# Patient Record
Sex: Female | Born: 1935
Health system: Southern US, Community
[De-identification: ages and names within clinical notes are randomized; demographics above are authoritative.]

## PROBLEM LIST (undated history)

## (undated) DIAGNOSIS — R42 Dizziness and giddiness: Secondary | ICD-10-CM

## (undated) DIAGNOSIS — E785 Hyperlipidemia, unspecified: Secondary | ICD-10-CM

## (undated) DIAGNOSIS — M949 Disorder of cartilage, unspecified: Secondary | ICD-10-CM

## (undated) DIAGNOSIS — I8310 Varicose veins of unspecified lower extremity with inflammation: Secondary | ICD-10-CM

## (undated) DIAGNOSIS — M899 Disorder of bone, unspecified: Secondary | ICD-10-CM

## (undated) DIAGNOSIS — I809 Phlebitis and thrombophlebitis of unspecified site: Secondary | ICD-10-CM

## (undated) DIAGNOSIS — E039 Hypothyroidism, unspecified: Secondary | ICD-10-CM

## (undated) DIAGNOSIS — G309 Alzheimer's disease, unspecified: Secondary | ICD-10-CM

## (undated) DIAGNOSIS — R0989 Other specified symptoms and signs involving the circulatory and respiratory systems: Secondary | ICD-10-CM

## (undated) DIAGNOSIS — R5381 Other malaise: Secondary | ICD-10-CM

## (undated) DIAGNOSIS — C73 Malignant neoplasm of thyroid gland: Secondary | ICD-10-CM

## (undated) DIAGNOSIS — C801 Malignant (primary) neoplasm, unspecified: Secondary | ICD-10-CM

## (undated) DIAGNOSIS — R5383 Other fatigue: Secondary | ICD-10-CM

## (undated) DIAGNOSIS — G609 Hereditary and idiopathic neuropathy, unspecified: Secondary | ICD-10-CM

## (undated) DIAGNOSIS — E789 Disorder of lipoprotein metabolism, unspecified: Secondary | ICD-10-CM

## (undated) DIAGNOSIS — F0280 Dementia in other diseases classified elsewhere without behavioral disturbance: Secondary | ICD-10-CM

## (undated) DIAGNOSIS — I10 Essential (primary) hypertension: Secondary | ICD-10-CM

## (undated) DIAGNOSIS — E041 Nontoxic single thyroid nodule: Secondary | ICD-10-CM

## (undated) DIAGNOSIS — M810 Age-related osteoporosis without current pathological fracture: Secondary | ICD-10-CM

## (undated) DIAGNOSIS — G4762 Sleep related leg cramps: Secondary | ICD-10-CM

## (undated) DIAGNOSIS — I639 Cerebral infarction, unspecified: Secondary | ICD-10-CM

## (undated) HISTORY — DX: Disorder of cartilage, unspecified: M94.9

## (undated) HISTORY — DX: Malignant neoplasm of thyroid gland: C73

## (undated) HISTORY — DX: Other fatigue: R53.83

## (undated) HISTORY — DX: Essential (primary) hypertension: I10

## (undated) HISTORY — DX: Disorder of bone, unspecified: M89.9

## (undated) HISTORY — DX: Nontoxic single thyroid nodule: E04.1

## (undated) HISTORY — DX: Dementia in other diseases classified elsewhere, unspecified severity, without behavioral disturbance, psychotic disturbance, mood disturbance, and anxiety: F02.80

## (undated) HISTORY — DX: Other specified symptoms and signs involving the circulatory and respiratory systems: R09.89

## (undated) HISTORY — DX: Sleep related leg cramps: G47.62

## (undated) HISTORY — DX: Malignant (primary) neoplasm, unspecified: C80.1

## (undated) HISTORY — DX: Hypothyroidism, unspecified: E03.9

## (undated) HISTORY — DX: Age-related osteoporosis without current pathological fracture: M81.0

## (undated) HISTORY — DX: Phlebitis and thrombophlebitis of unspecified site: I80.9

## (undated) HISTORY — PX: COLON SURGERY: SHX602

## (undated) HISTORY — DX: Varicose veins of unspecified lower extremity with inflammation: I83.10

## (undated) HISTORY — DX: Alzheimer's disease, unspecified: G30.9

## (undated) HISTORY — DX: Hyperlipidemia, unspecified: E78.5

## (undated) HISTORY — PX: ABDOMINAL HYSTERECTOMY: SHX81

## (undated) HISTORY — DX: Dizziness and giddiness: R42

## (undated) HISTORY — DX: Other malaise: R53.81

## (undated) HISTORY — PX: KNEE SURGERY: SHX244

## (undated) HISTORY — DX: Disorder of lipoprotein metabolism, unspecified: E78.9

## (undated) HISTORY — DX: Hereditary and idiopathic neuropathy, unspecified: G60.9

---

## 1981-12-31 HISTORY — PX: OTHER SURGICAL HISTORY: SHX169

## 2001-10-24 ENCOUNTER — Encounter: Payer: Self-pay | Admitting: *Deleted

## 2001-10-28 ENCOUNTER — Inpatient Hospital Stay (HOSPITAL_COMMUNITY): Admission: RE | Admit: 2001-10-28 | Discharge: 2001-10-31 | Payer: Self-pay | Admitting: *Deleted

## 2001-10-31 ENCOUNTER — Inpatient Hospital Stay (HOSPITAL_COMMUNITY)
Admission: RE | Admit: 2001-10-31 | Discharge: 2001-11-07 | Payer: Self-pay | Admitting: Physical Medicine & Rehabilitation

## 2001-12-03 ENCOUNTER — Encounter: Admission: RE | Admit: 2001-12-03 | Discharge: 2002-01-30 | Payer: Self-pay | Admitting: *Deleted

## 2004-04-07 ENCOUNTER — Ambulatory Visit (HOSPITAL_COMMUNITY): Admission: RE | Admit: 2004-04-07 | Discharge: 2004-04-07 | Payer: Self-pay | Admitting: Endocrinology

## 2006-12-31 HISTORY — PX: OTHER SURGICAL HISTORY: SHX169

## 2007-01-25 ENCOUNTER — Emergency Department (HOSPITAL_COMMUNITY): Admission: EM | Admit: 2007-01-25 | Discharge: 2007-01-25 | Payer: Self-pay | Admitting: Emergency Medicine

## 2007-02-05 ENCOUNTER — Encounter: Admission: RE | Admit: 2007-02-05 | Discharge: 2007-02-05 | Payer: Self-pay | Admitting: Orthopedic Surgery

## 2009-12-07 LAB — HM COLONOSCOPY

## 2010-02-19 ENCOUNTER — Emergency Department (HOSPITAL_COMMUNITY): Admission: EM | Admit: 2010-02-19 | Discharge: 2010-02-19 | Payer: Self-pay | Admitting: Emergency Medicine

## 2010-09-10 ENCOUNTER — Ambulatory Visit: Payer: Self-pay | Admitting: Vascular Surgery

## 2010-09-10 ENCOUNTER — Encounter (INDEPENDENT_AMBULATORY_CARE_PROVIDER_SITE_OTHER): Payer: Self-pay | Admitting: Emergency Medicine

## 2010-09-10 ENCOUNTER — Emergency Department (HOSPITAL_COMMUNITY): Admission: EM | Admit: 2010-09-10 | Discharge: 2010-09-10 | Payer: Self-pay | Admitting: Emergency Medicine

## 2010-10-20 ENCOUNTER — Encounter: Admission: RE | Admit: 2010-10-20 | Discharge: 2010-10-20 | Payer: Self-pay | Admitting: Internal Medicine

## 2011-01-20 ENCOUNTER — Encounter: Payer: Self-pay | Admitting: Internal Medicine

## 2011-02-16 ENCOUNTER — Other Ambulatory Visit: Payer: Self-pay | Admitting: Internal Medicine

## 2011-02-16 DIAGNOSIS — R0989 Other specified symptoms and signs involving the circulatory and respiratory systems: Secondary | ICD-10-CM

## 2011-02-20 ENCOUNTER — Ambulatory Visit
Admission: RE | Admit: 2011-02-20 | Discharge: 2011-02-20 | Disposition: A | Discharge status: Home / Self Care | Payer: PRIVATE HEALTH INSURANCE | Source: Ambulatory Visit | Attending: Internal Medicine | Admitting: Internal Medicine

## 2011-02-20 DIAGNOSIS — R0989 Other specified symptoms and signs involving the circulatory and respiratory systems: Secondary | ICD-10-CM

## 2011-02-28 ENCOUNTER — Other Ambulatory Visit: Payer: Self-pay | Admitting: Internal Medicine

## 2011-02-28 DIAGNOSIS — E041 Nontoxic single thyroid nodule: Secondary | ICD-10-CM

## 2011-03-07 ENCOUNTER — Other Ambulatory Visit: Payer: PRIVATE HEALTH INSURANCE

## 2011-04-09 ENCOUNTER — Other Ambulatory Visit: Payer: Self-pay | Admitting: Endocrinology

## 2011-04-09 DIAGNOSIS — E042 Nontoxic multinodular goiter: Secondary | ICD-10-CM

## 2011-04-17 ENCOUNTER — Ambulatory Visit
Admission: RE | Admit: 2011-04-17 | Discharge: 2011-04-17 | Disposition: A | Discharge status: Home / Self Care | Payer: Medicare Other | Source: Ambulatory Visit | Attending: Endocrinology | Admitting: Endocrinology

## 2011-04-17 ENCOUNTER — Other Ambulatory Visit: Payer: Self-pay | Admitting: Interventional Radiology

## 2011-04-17 ENCOUNTER — Other Ambulatory Visit (HOSPITAL_COMMUNITY)
Admission: RE | Admit: 2011-04-17 | Discharge: 2011-04-17 | Disposition: A | Discharge status: Home / Self Care | Payer: Medicare Other | Source: Ambulatory Visit | Attending: Interventional Radiology | Admitting: Interventional Radiology

## 2011-04-17 DIAGNOSIS — E042 Nontoxic multinodular goiter: Secondary | ICD-10-CM

## 2011-04-17 DIAGNOSIS — E049 Nontoxic goiter, unspecified: Secondary | ICD-10-CM | POA: Insufficient documentation

## 2011-05-18 NOTE — Discharge Summary (Signed)
[place]. [facility]  Patient:    Shelia Marks, Shelia Marks Visit Number: [id number] MRN: [id number]          Service Type: [facility] Location: [id number] Attending Physician:  Herold Harms Dictated by:   Mcarthur Rossetti. Angiulli, P.A. Admit Date:  10/31/2001 Discharge Date: 11/07/2001   CC:         Reynolds Bowl, M.D., orthopedic services  Alfonse Alpers. Dagoberto Ligas, M.D.   Discharge Summary  DISCHARGE DIAGNOSES: 1. Right total knee arthroplasty October 28, 2001. 2. Postoperative anemia. 3. Hypertension. 4. Gout. 5. Hyperlipidemia.  HISTORY OF PRESENT ILLNESS:  A 77 year old female admitted October 28, 2001, with history of a left partial patellectomy.  She presented with chronic right knee pain where x-ray showed tricompartmental osteoarthritis with varus deformity.  No relief with conservative care.  She underwent a right total knee arthroplasty October 28, 2001, per Reynolds Bowl, M.D.  She was placed on Coumadin for deep venous thrombosis prophylaxis and weightbearing as tolerated.  She had postoperative anemia and was transfused.  There was no chest pain or shortness of breath.  She was minimal assistance of only a few steps.  Latest hemoglobin 10.6, INR 1.7.  Chest x-ray negative.  The patient was admitted for a comprehensive rehab program.  PAST MEDICAL HISTORY:  See discharge diagnoses.  PAST SURGICAL HISTORY:  Thyroid surgery and hysterectomy.  ALLERGIES:  No known drug allergies.  MEDICATIONS PRIOR TO ADMISSION:  Lipitor, allopurinol, atenolol and hydrochlorothiazide.  PRIMARY M.D.:  Alfonse Alpers. Dagoberto Ligas, M.D.  SOCIAL HISTORY:  Occasional alcohol.  No tobacco.  She lives alone in [place], [place].  She was independent prior to admission.  She lives in a one level home with two steps to entry. There is no local family.  HOSPITAL COURSE:  The patient did well while in rehabilitation services with therapies initiated on a b.i.d. basis.  The  following issues were followed during the patients rehab course.  Pertaining to [name] right total knee arthroplasty, she remained stable.  Surgical site healing nicely. Staples had been removed. There were no signs of infection.  She was ambulating household distances with a walker, weightbearing as tolerated.  She continued on Coumadin for deep venous thrombosis prophylaxis.  Latest INR of 2.5. She would complete Coumadin protocol with home health services to continue to follow.  Postoperative anemia stable.  Latest hemoglobin of 9.4, hematocrit 28.6.  Follow-up CBCs pending. There were no bleeding episodes. She continued on iron supplement.  Her blood pressures remained controlled on Maxzide as well as Tenormin.  There was no headache or dizziness.  She would follow up with her primary M.D.  for ongoing medical management.  Overall for her functional mobility, she was ambulating extended household distances with a walker, essentially independent to standby assist in all area of activities of daily living, of dressing, grooming and homemaking.  Overall, her strength and endurance greatly improved, as she was encouraged with her overall progress and discharged to home with home health physical and occupational therapy.  DISCHARGE MEDICATIONS: 1. Coumadin daily.  Latest Coumadin therapy was daily to complete Coumadin    protocol of 5 mg. 2. Zocor 20 mg daily. 3. Tenormin 25 mg daily. 4. Multivitamin daily. 5. Maxzide one tablet daily. 6. Trinsicon twice daily. 7. Tylox one or two tablets every four hours as needed for pain.  ACTIVITY:  Weightbearing as tolerated with a walker.  DIET:  Regular.  WOUND CARE:  Cleanse incision daily  with warm soap and water.  FOLLOW-UP:  Home health physical and occupational therapy, home health nurse for prothrombin time weekly to complete Coumadin protocol.  She should follow up with Dr. Criss Alvine, call for appointment; Dr. Dagoberto Ligas for medical  management. Dictated by:   Mcarthur Rossetti. Angiulli, P.A. Attending Physician:  Herold Harms DD:  11/06/01 TD:  11/07/01 Job: [id number]

## 2011-05-18 NOTE — Discharge Summary (Signed)
[place]. [facility]  Patient:    Shelia Marks, Shelia Marks Visit Number: [id number] MRN: [id number]          Service Type: [facility] Location: 4100 [id number] Attending Physician:  Herold Harms Dictated by:   Reynolds Bowl, M.D. Admit Date:  10/31/2001 Discharge Date: 11/07/2001                             Discharge Summary  ADMITTING DIAGNOSES:  Tricompartment osteoarthritis with varus right knee, hypertension, hypercholesterolemia, history of gout, history of prior patellectomy left knee.  DISCHARGE DIAGNOSES:  Tricompartment osteoarthritis with varus right knee, hypertension, hypercholesterolemia, history of gout, history of prior patellectomy left knee, postoperative anemia treated 2 units of packed red blood cells.  HISTORY:  For history and physical see that dictated on admission.  On the day of admission patient underwent right total knee arthroplasty with cementing of the components as detailed in the operative note.  At the time it was felt that 300 cc of blood was lost and none was replaced.  Pre and postoperatively she was on prophylactic antibiotics.  Postoperatively she was begun on prophylactic Coumadin.  First day postoperative she was begun on weightbearing as tolerated.  She was immediately postoperative on a CPM machine.  First day postoperative her Foley catheter was removed and suction Hemovac removed.  Her admitting hemoglobin was 12.  On October 31 hemoglobin was 7.2.  She was therefore given 2 units of packed red blood cells which then improved her general status and her hemoglobin was then elevated to 10.6.  Her INR was 1.9. Her wound was healing fine.  She maintained good leg control and ankle control and on November 1 was discharged to be admitted to the rehabilitation unit to work on motor and motion rehabilitation.  The plan is for me to follow her in that unit. Dictated by:   Reynolds Bowl, M.D. Attending Physician:  Herold Harms DD:  11/18/01 TD:  11/18/01 Job: [id number]

## 2011-05-18 NOTE — H&P (Signed)
[place]. [facility]  Patient:    Shelia Marks, Shelia Marks Visit Number: [id number] MRN: [id number]          Service Type: Attending:  Reynolds Bowl, M.D. Dictated by:   Reynolds Bowl, M.D. Adm. Date:  10/24/01                           History and Physical  CHIEF COMPLAINT: Ms. Shelia Marks is a 77 year old lady, with a chief complaint of pain and hard to keep going with right knee.  HISTORY OF PRESENT ILLNESS: The patient has been followed in this office since 1999 with complaints of knee pain and findings of osteoarthritis.  This is progressive and had been given her more and more problems.  She has tried multiple over-the-counter medications.  She has found it hard to shop.  She cannot do other kinds of shopping.  She cannot use steps.  She cannot squat. She has difficulty getting out of a chair.  She has night pain, and intermittently the knee swells.  Recent evaluation disclosed significant tricompartment osteoarthritis with varus and when compared to prior films shows continuous progression.  We discussed surgery and possibility of infection, pain, limited motion, thrombophlebitis, etc., and she would like to proceed on with same.  PAST MEDICAL HISTORY:  1. She had a partial patellectomy on the left by Dr. Ollen Bowl in the past.  2. She has elevated cholesterol.  3. History of gout.  4. History of hypertension.  SOCIAL HISTORY: She does not smoke cigarettes nor drink ethanol.  She is not too sure of her Pap and mammogram history.  MEDICATIONS:  1. Lipitor 10 mg q.d.  2. Allopurinol 300 mg q.d.  3. Atenolol 25 mg one q.d.  4. Hydrochlorothiazide 75/50 mg 1/2 q.d.  PRIMARY CARE DOCTOR: Dr. [name] Parker.  PHYSICAL EXAMINATION:  VITAL SIGNS: Temperature 98 degrees, pulse 78, respirations 20, blood pressure 138/72.  Height 5 feet 1 inch.  Weight 176 pounds.  GENERAL: She appears to be overweight.  She ambulates with an antalgic gait.  HEENT: She  wears glasses.  EOMI.  Tympanic membranes intact with good light reflex.  NECK: Moves well without apparent discomfort.  No carotid bruits.  No palpable mass.  CHEST: Clear.  HEART: Regular rhythm.  No murmurs heard.  ABDOMEN: Soft.  No palpable masses.  Bowel sounds normal.  EXTREMITIES: Examination of the right knee discloses she has an antalgic gait. She has a good femoral pulse without bruit.  She has a weak dorsalis pedis pulse.  The foot is warm.  There is no pretibial edema.  The right knee is in varus.  Motion is from about 8-120 degrees.  She has 1+ varus-valgus laxity. The knee has crepitus.  There is no effusion.  LABORATORY DATA: X-rays show varus, loss of joint space, and tricompartment osteoarthritis.  ADMITTING DIAGNOSIS:  1. Tricompartmental osteoarthritis with varus, right knee.  2. Hypertension.  3. Hypercholesterolemia.  4. History of gout.  5. History of prior patellectomy of left knee.  PLAN: Right total knee arthroplasty.  This has been fully discussed.  The patient knows there are no guarantees and she would like to proceed on with same. Dictated by:   Reynolds Bowl, M.D. Attending:  Reynolds Bowl, M.D. DD:  10/24/01 TD:  10/25/01 Job: [id number]

## 2011-05-18 NOTE — Op Note (Signed)
[place]. [facility]  Patient:    Shelia Marks, Shelia Marks Visit Number: [id number] MRN: [id number]          Service Type: SUR Location: 5000 [id number] Attending Physician:  Maryanna Shape Dictated by:   Reynolds Bowl, M.D. Proc. Date: 10/28/01 Admit Date:  10/28/2001                             Operative Report  PREOPERATIVE DIAGNOSIS:  Osteoarthritis right knee.  POSTOPERATIVE DIAGNOSIS:  Osteoarthritis right knee.  OPERATIVE PROCEDURE:  Right total knee arthroplasty.  ANESTHESIA:  Spinal followed by femoral block.  SURGEON:  Fritzi Mandes, M.D.  ASSISTANT:  Cammy Copa, M.D.  DESCRIPTION OF PROCEDURE:  The patient had an IV started, 2 g of Ancef were given IV.  She was then given anesthetic, and a Foley catheter was put in place.  All bony prominences were padded on the operative table.  Pads were placed under the right hip to internally rotate.  A proximal pneumatic tourniquet was applied and then isolated with a U drape.  She was then prepped from the edge of the U drape to and including the toes with Duraprep.  She was then draped in usual manner, and this included the use of two ______ drapes around the knee.  A long straight incision was made beginning a few inches proximal to the upper pole of the patella, carried through about the mid patella, and exited along the medial side of the patellar tendon.  The medial capsule and collateral ligaments were elevated sharply over a couple of centimeters all the way back to and including the posterior corner.  Then the lateral side was exposed back to the popliteas, and a lateral meniscectomy accomplished.  At this point, the cruciates were resected.  Then, with intramedullary guide, the distal femur was resected in 5 degrees of valgus, approximately 12 mm.  The distal femur was then shaped after sizing. We used the sizer and decided on size #7 for the femur, and then shaped the distal  femur with the chamfers using the guide, and this included using the cutting then impaction guide for the notch area.  On the tibial side, decided on a tibial tray #7.  We resected the tibia a little more than 10 mm with a 5 degree posterior slope.  Then again used a trial and size 7 fit well.  We then assembled a femoral trial #7, tibial tray #7, with the bearing insert 10 mm thick #7.  This allowed the knee to fall into full flexion and fully extend to about neutral.  It was stable in varus valgus, was stable in extension and flexion.  At this point then the tibia was impacted to accept the tibial tray thin after determining the right rotation for the tibial tray.  The patella was measured at about 24 mm thick, it was resected 10 mm.  We then sized that patella to be a size #5, and then with trial we had restored the thickness to the pre-resection state.  At this point then the trials, everything tracked well, and we elected to continue on with the same sizes as we had tried before.  The area was copiously irrigated with pulsatile lavage.  The tibial tray was cemented in place.  All excess cement looked for and removed.  Then the femoral component cemented in place, and all excess cement looked for and removed.  Following this, the 10 mm thick tibia tray was inserted.  The knee was held in extension.  During this time the patella button with the medial offset was cemented in place and held there with the clamp, and all excess cement removed.  We allowed the knee to rest in this position until all cement was hardened, then again copiously irrigated, then let the tourniquet down, found several venous bleeders which were Bovie cauterized, and then we closed the knee approximating the retinaculum with figure-of-eight sutures of #1 Vicryl, and more superficial stitches with 2-0 Vicryl.  Skin edges with metal staples. One suction Hemovac was placed just superficial to the retinaculum, and  was brought out superior lateral side.  Having closed everything, I flexed the hip, and allowed the knee to fall into full flexion.  It would fully extend, and was stable.  The xeroform, 4 x 4, ABD, Kerlix, Ace wrap dressing applied, followed by knee immobilizer.  The patient returned in recovery room in good condition.  ESTIMATED BLOOD LOSS:  In the area of 300 cc, none was replaced.  Just prior to return to the recovery room she received a femoral block, and on return to the recovery room the second dose of Ancef was given. Dictated by:   Reynolds Bowl, M.D. Attending Physician:  Maryanna Shape DD:  10/28/01 TD:  10/29/01 Job: [id number]

## 2011-05-31 ENCOUNTER — Other Ambulatory Visit (INDEPENDENT_AMBULATORY_CARE_PROVIDER_SITE_OTHER): Payer: Self-pay | Admitting: Surgery

## 2011-05-31 DIAGNOSIS — E049 Nontoxic goiter, unspecified: Secondary | ICD-10-CM

## 2011-06-05 ENCOUNTER — Other Ambulatory Visit (INDEPENDENT_AMBULATORY_CARE_PROVIDER_SITE_OTHER): Payer: Self-pay | Admitting: Surgery

## 2011-06-05 LAB — BUN: BUN: 17 mg/dL (ref 6–23)

## 2011-06-05 LAB — CREATININE, SERUM: Creat: 1.02 mg/dL (ref 0.50–1.10)

## 2011-06-15 ENCOUNTER — Ambulatory Visit
Admission: RE | Admit: 2011-06-15 | Discharge: 2011-06-15 | Disposition: A | Discharge status: Home / Self Care | Payer: Medicare Other | Source: Ambulatory Visit | Attending: Surgery | Admitting: Surgery

## 2011-06-15 DIAGNOSIS — E049 Nontoxic goiter, unspecified: Secondary | ICD-10-CM

## 2011-06-15 MED ORDER — IOHEXOL 300 MG/ML  SOLN
75.0000 mL | Freq: Once | INTRAMUSCULAR | Status: AC | PRN
  Administered 2011-06-15: 75 mL via INTRAVENOUS

## 2011-06-22 ENCOUNTER — Encounter (INDEPENDENT_AMBULATORY_CARE_PROVIDER_SITE_OTHER): Payer: Self-pay | Admitting: General Surgery

## 2011-07-16 ENCOUNTER — Ambulatory Visit (HOSPITAL_COMMUNITY)
Admission: RE | Admit: 2011-07-16 | Discharge: 2011-07-16 | Disposition: A | Discharge status: Home / Self Care | Payer: Medicare Other | Source: Ambulatory Visit | Attending: Surgery | Admitting: Surgery

## 2011-07-16 ENCOUNTER — Other Ambulatory Visit (INDEPENDENT_AMBULATORY_CARE_PROVIDER_SITE_OTHER): Payer: Self-pay | Admitting: Surgery

## 2011-07-16 ENCOUNTER — Encounter (HOSPITAL_COMMUNITY): Payer: Medicare Other

## 2011-07-16 DIAGNOSIS — Z01812 Encounter for preprocedural laboratory examination: Secondary | ICD-10-CM | POA: Insufficient documentation

## 2011-07-16 DIAGNOSIS — E041 Nontoxic single thyroid nodule: Secondary | ICD-10-CM | POA: Insufficient documentation

## 2011-07-16 DIAGNOSIS — Z01818 Encounter for other preprocedural examination: Secondary | ICD-10-CM

## 2011-07-16 DIAGNOSIS — I498 Other specified cardiac arrhythmias: Secondary | ICD-10-CM | POA: Insufficient documentation

## 2011-07-16 DIAGNOSIS — Z01810 Encounter for preprocedural cardiovascular examination: Secondary | ICD-10-CM | POA: Insufficient documentation

## 2011-07-16 LAB — BASIC METABOLIC PANEL
BUN: 18 mg/dL (ref 6–23)
CO2: 29 mEq/L (ref 19–32)
Calcium: 11 mg/dL — ABNORMAL HIGH (ref 8.4–10.5)
Chloride: 107 mEq/L (ref 96–112)
Creatinine, Ser: 0.95 mg/dL (ref 0.50–1.10)
GFR calc Af Amer: >60 mL/min (ref >60)
GFR calc non Af Amer: 56 mL/min — ABNORMAL LOW (ref >60)
Glucose, Bld: 89 mg/dL (ref 70–99)
Potassium: 4.2 mEq/L (ref 3.5–5.1)
Sodium: 141 mEq/L (ref 135–145)

## 2011-07-16 LAB — CBC
HCT: 38.1 % (ref 36.0–46.0)
Hemoglobin: 11.9 g/dL — ABNORMAL LOW (ref 12.0–15.0)
MCH: 28.4 pg (ref 26.0–34.0)
MCHC: 31.2 g/dL (ref 30.0–36.0)
MCV: 90.9 fL (ref 78.0–100.0)
Platelets: 155 10*3/uL (ref 150–400)
RBC: 4.19 MIL/uL (ref 3.87–5.11)
RDW: 14.7 % (ref 11.5–15.5)
WBC: 4 10*3/uL (ref 4.0–10.5)

## 2011-07-16 LAB — SURGICAL PCR SCREEN
MRSA, PCR: NEGATIVE
Staphylococcus aureus: POSITIVE — AB

## 2011-07-20 ENCOUNTER — Other Ambulatory Visit (INDEPENDENT_AMBULATORY_CARE_PROVIDER_SITE_OTHER): Payer: Self-pay | Admitting: Surgery

## 2011-07-20 ENCOUNTER — Ambulatory Visit (HOSPITAL_COMMUNITY)
Admission: RE | Admit: 2011-07-20 | Discharge: 2011-07-22 | Disposition: A | Discharge status: Home / Self Care | Payer: Medicare Other | Source: Ambulatory Visit | Attending: Surgery | Admitting: Surgery

## 2011-07-20 DIAGNOSIS — C73 Malignant neoplasm of thyroid gland: Secondary | ICD-10-CM | POA: Insufficient documentation

## 2011-07-20 DIAGNOSIS — I10 Essential (primary) hypertension: Secondary | ICD-10-CM | POA: Insufficient documentation

## 2011-07-20 HISTORY — PX: THYROID SURGERY: SHX805

## 2011-07-21 LAB — URINE MICROSCOPIC-ADD ON

## 2011-07-21 LAB — URINALYSIS, ROUTINE W REFLEX MICROSCOPIC
Bilirubin Urine: NEGATIVE
Glucose, UA: NEGATIVE mg/dL
Ketones, ur: NEGATIVE mg/dL
Leukocytes, UA: NEGATIVE
Nitrite: NEGATIVE
Protein, ur: NEGATIVE mg/dL
Specific Gravity, Urine: 1.011 (ref 1.005–1.030)
Urobilinogen, UA: 0.2 mg/dL (ref 0.0–1.0)
pH: 6 (ref 5.0–8.0)

## 2011-07-21 LAB — CALCIUM: Calcium: 9.5 mg/dL (ref 8.4–10.5)

## 2011-07-22 LAB — URINE CULTURE
Colony Count: NO GROWTH
Culture  Setup Time: 201207211315
Culture: NO GROWTH
Special Requests: NEGATIVE

## 2011-07-23 NOTE — Op Note (Signed)
Shelia Marks, MOTHERSHEAD           ACCOUNT NO.:  [id number]  MEDICAL RECORD NO.:  [id number]  LOCATION:  DAYL                         FACILITY:  [facility]  PHYSICIAN:  Abigail Miyamoto, M.D. DATE OF BIRTH:  August 21, 1926  DATE OF PROCEDURE:  07/20/2011 DATE OF DISCHARGE:                              OPERATIVE REPORT   PREOPERATIVE DIAGNOSIS:  Thyroid nodule.  POSTOPERATIVE DIAGNOSIS:  Thyroid nodule.  PROCEDURE:  Total thyroidectomy.  SURGEON:  Abigail Miyamoto, MD  ASSISTANT:  Adolph Pollack, MD  ANESTHESIA:  General endotracheal anesthesia.  ESTIMATED BLOOD LOSS:  Minimal.  INDICATIONS:  This is an 77 year old female who was found to have a thyroid nodule.  This was worked up with ultrasound, CAT scan and fine- needle aspiration.  She was found to have atypical Hurthle cells and because of the multiple nodules and pathologic findings, decision was made to proceed with a total thyroidectomy.  FINDINGS:  The patient was indeed found to have a multinodular thyroid gland.  PROCEDURE IN DETAIL:  The patient was brought to the operative room, identified as Shelia Marks.  She was placed supine on the operating room table and general anesthesia was induced.  The patient had a previous total neck exploration for parathyroid adenoma many years ago. After she was prepped and draped in usual sterile fashion, I made a longitudinal incision across the lower neck through the old previous scar.  I took this down through the platysma with electrocautery. Superior and inferior skin flaps were then created.  I  tied off several bridging veins with 3-0 silk sutures.  I then identified the midline, opened this up with the electrocautery as well.  Another bridging vein had to be tied off in the midline.  I first turned my attention towards the left thyroid lobe.  I was able to easily separate the overlying strap muscles off the thyroid gland.  There was a lot of edema in the neck and  this made it actually easy to dissect the thyroid gland itself because I was able to easily take down superior pole vessels and identify them and clipped them with surgical clips and taking down the rest with Harmonic scalpel.  I also identified the inferior pole vessels and an inferior parathyroid gland which I was able to easily separate from the thyroid tissue itself.  The middle vein was then identified and clipped and transected.  This then made it easy to identify the recurrent laryngeal nerve and thyroid artery.  I stayed close to the gland and dissected the gland off the trachea.  I took down the superior thyroid artery with surgical clips.  Once the artery had been dissected off the gland, the gland then came easily off the rest of the trachea to the midline.  I took down the pyramidal lobe as well.  Next, I turned my attention towards the right gland.  The right gland was stuck much more to the overlying strap muscles.  I had to separate this out with the electrocautery.  I believe this was secondary to previous biopsy.  She also had a moderate amount of edema in the neck on this side.  Once I was able to finally separate the strap muscles  off with the lower pole of thyroid gland, I was able to easily identify the poles of the thyroid.  I took down superior pole vessels with surgical clips and the harmonic scalpel.  I did that likewise to the inferior vessels as well. The thyroid gland on this side was stuck more on top of the middle vein and the thyroid artery and recurrent laryngeal nerve.  I stayed around the gland and took down the vessel with clips and Harmonic scalpel, taking care to spare the recurrent laryngeal nerve.  I was then able to separate the gland further and then elevate up off the trachea and complete the total thyroidectomy with the electrocautery.  Again several small veins were clipped with surgical clips.  Once the gland was removed, I marked to the  superior pole with a Vicryl suture.  I then thoroughly irrigated the neck with normal saline.  Hemostasis appeared to be achieved.  I then placed fibrillar for hemostasis on both sides of the dissection.  I then closed the patient's midline with interrupted and figure-of-eight 3-0 Vicryl sutures.  I then reapproximated the platysma with interrupted 3-0 Vicryl sutures and closed the skin with running 4-0 Monocryl.  Steri-Strips, gauze and tape were then applied. The patient tolerated the procedure well.  All counts were correct at the end of the procedure.  The patient was then extubated in the operating room and taken in stable condition to recovery room.     Abigail Miyamoto, M.D.     DB/MEDQ  D:  07/20/2011  T:  07/20/2011  Job:  [id number]  Electronically Signed by Abigail Miyamoto M.D. on 07/23/2011 08:23:21 AM

## 2011-07-24 ENCOUNTER — Telehealth (INDEPENDENT_AMBULATORY_CARE_PROVIDER_SITE_OTHER): Payer: Self-pay | Admitting: General Surgery

## 2011-07-24 ENCOUNTER — Other Ambulatory Visit (INDEPENDENT_AMBULATORY_CARE_PROVIDER_SITE_OTHER): Payer: Self-pay | Admitting: Surgery

## 2011-07-24 DIAGNOSIS — E041 Nontoxic single thyroid nodule: Secondary | ICD-10-CM

## 2011-07-24 LAB — BASIC METABOLIC PANEL
BUN: 22 mg/dL (ref 6–23)
CO2: 27 mEq/L (ref 19–32)
Calcium: 7.6 mg/dL — ABNORMAL LOW (ref 8.4–10.5)
Chloride: 105 mEq/L (ref 96–112)
Creat: 1.09 mg/dL (ref 0.50–1.10)
Glucose, Bld: 93 mg/dL (ref 70–99)
Potassium: 3.5 mEq/L (ref 3.5–5.3)
Sodium: 143 mEq/L (ref 135–145)

## 2011-07-24 NOTE — Telephone Encounter (Signed)
I called Dr Dierdre Searles reg Shelia Marks path for her Thy/ Dr Dierdre Searles is going to send the path out to [facility] for more testing Dr Dierdre Searles is seeing in the path that she did is showing all of fatty tissue. She stated that the lesion is 1.2 cm and if you need to talk her you can call [phone] and she also stated that we will have finally path back before pt appt on 08-02-11 [facility]

## 2011-07-24 NOTE — Telephone Encounter (Signed)
Pt's daughter c/o of pt having tingling behind shoulders after total thyroidectomy on 07-20-11. I spoke to Dr Biagio Quint about the problem and he advised pt to go to [place] labs to have Bmet drawn today and follow up with DrBlackman./ [facility]

## 2011-07-25 NOTE — Telephone Encounter (Signed)
Pt has low Ca++.  Needs to take tums with calcium 4 times daily

## 2011-07-25 NOTE — Telephone Encounter (Signed)
Called pt this morning to let her know about the tums with calcium 4 times daily per Dr Magnus Ivan and I also told her to keep her appt with Dr Magnus Ivan on 08-02-11  [facility] 07-25-11 @ 8:45

## 2011-07-31 ENCOUNTER — Encounter (INDEPENDENT_AMBULATORY_CARE_PROVIDER_SITE_OTHER): Payer: Self-pay | Admitting: Surgery

## 2011-08-02 ENCOUNTER — Ambulatory Visit (INDEPENDENT_AMBULATORY_CARE_PROVIDER_SITE_OTHER): Payer: Medicare Other | Admitting: Surgery

## 2011-08-02 ENCOUNTER — Encounter (INDEPENDENT_AMBULATORY_CARE_PROVIDER_SITE_OTHER): Payer: Self-pay | Admitting: Surgery

## 2011-08-02 DIAGNOSIS — C73 Malignant neoplasm of thyroid gland: Secondary | ICD-10-CM

## 2011-08-02 NOTE — Progress Notes (Signed)
Subjective:     Patient ID: Shelia Marks, female   DOB: Sep 01, 1926, 77 y.o.   MRN: [id number]  HPI She is here for her first postoperative visit status post total thyroidectomy. She has no complaints. She reports her voice strength is improved. She has no difficulty swallowing.  Review of Systems     Objective:   Physical Exam On examination, her incision is healing very well. Her voice sounds good. There is no swelling or hematoma.  The final pathology of the thyroid gland showed a very small papillary microcarcinoma 0.7 cm in size. The rest of the gland showed goiter.  She is currently on 75 mcg of Synthroid   Assessment:     Patient status post total thyroidectomy with findings of papillary cancer    Plan:     She will continue the Synthroid. I will have her followup with her primary care physician and her endocrinologist regarding further care of the papillary thyroid cancer. I will see her back in one month.

## 2011-09-06 ENCOUNTER — Encounter (INDEPENDENT_AMBULATORY_CARE_PROVIDER_SITE_OTHER): Payer: Self-pay | Admitting: Surgery

## 2011-09-06 ENCOUNTER — Ambulatory Visit (INDEPENDENT_AMBULATORY_CARE_PROVIDER_SITE_OTHER): Payer: Medicare Other | Admitting: Surgery

## 2011-09-06 VITALS — BP 154/78 | HR 68

## 2011-09-06 DIAGNOSIS — Z09 Encounter for follow-up examination after completed treatment for conditions other than malignant neoplasm: Secondary | ICD-10-CM

## 2011-09-06 NOTE — Progress Notes (Signed)
Subjective:     Patient ID: Shelia Marks, female   DOB: 05-28-1926, 77 y.o.   MRN: [id number]  HPI She is here for a one-month followup of her total thyroidectomy for papillary thyroid cancer. She is following up with her endocrinologist who is adjusting her Synthroid. She has no complaints. She reports that her voice is on was back to normal.  Review of Systems     Objective:   Physical Exam On exam, her neck incision is healing well. There is no swelling. Her voice sounds strong.    Assessment:     Patient one month status post total thyroidectomy for papillary thyroid cancer    Plan:     She will continue to see her endocrinologist. I will see her back as needed

## 2011-10-26 ENCOUNTER — Other Ambulatory Visit: Payer: Self-pay | Admitting: Internal Medicine

## 2011-10-26 DIAGNOSIS — Z780 Asymptomatic menopausal state: Secondary | ICD-10-CM

## 2011-10-26 DIAGNOSIS — Z1231 Encounter for screening mammogram for malignant neoplasm of breast: Secondary | ICD-10-CM

## 2011-12-05 ENCOUNTER — Ambulatory Visit
Admission: RE | Admit: 2011-12-05 | Discharge: 2011-12-05 | Disposition: A | Discharge status: Home / Self Care | Payer: Medicare Other | Source: Ambulatory Visit | Attending: Internal Medicine | Admitting: Internal Medicine

## 2011-12-05 DIAGNOSIS — Z780 Asymptomatic menopausal state: Secondary | ICD-10-CM

## 2011-12-05 DIAGNOSIS — Z1231 Encounter for screening mammogram for malignant neoplasm of breast: Secondary | ICD-10-CM

## 2011-12-05 LAB — HM DEXA SCAN

## 2012-01-17 DIAGNOSIS — H43399 Other vitreous opacities, unspecified eye: Secondary | ICD-10-CM | POA: Diagnosis not present

## 2012-01-28 DIAGNOSIS — G319 Degenerative disease of nervous system, unspecified: Secondary | ICD-10-CM | POA: Diagnosis not present

## 2012-01-28 DIAGNOSIS — I10 Essential (primary) hypertension: Secondary | ICD-10-CM | POA: Diagnosis not present

## 2012-01-28 DIAGNOSIS — G4762 Sleep related leg cramps: Secondary | ICD-10-CM | POA: Diagnosis not present

## 2012-01-28 DIAGNOSIS — I8310 Varicose veins of unspecified lower extremity with inflammation: Secondary | ICD-10-CM | POA: Diagnosis not present

## 2012-02-12 DIAGNOSIS — E890 Postprocedural hypothyroidism: Secondary | ICD-10-CM | POA: Diagnosis not present

## 2012-02-12 DIAGNOSIS — E039 Hypothyroidism, unspecified: Secondary | ICD-10-CM | POA: Diagnosis not present

## 2012-02-12 DIAGNOSIS — C73 Malignant neoplasm of thyroid gland: Secondary | ICD-10-CM | POA: Diagnosis not present

## 2012-02-25 DIAGNOSIS — I10 Essential (primary) hypertension: Secondary | ICD-10-CM | POA: Diagnosis not present

## 2012-04-28 DIAGNOSIS — C73 Malignant neoplasm of thyroid gland: Secondary | ICD-10-CM | POA: Diagnosis not present

## 2012-04-28 DIAGNOSIS — E041 Nontoxic single thyroid nodule: Secondary | ICD-10-CM | POA: Diagnosis not present

## 2012-04-28 DIAGNOSIS — I10 Essential (primary) hypertension: Secondary | ICD-10-CM | POA: Diagnosis not present

## 2012-04-28 DIAGNOSIS — G319 Degenerative disease of nervous system, unspecified: Secondary | ICD-10-CM | POA: Diagnosis not present

## 2012-07-09 DIAGNOSIS — M1710 Unilateral primary osteoarthritis, unspecified knee: Secondary | ICD-10-CM | POA: Diagnosis not present

## 2012-07-28 DIAGNOSIS — I10 Essential (primary) hypertension: Secondary | ICD-10-CM | POA: Diagnosis not present

## 2012-07-28 DIAGNOSIS — M810 Age-related osteoporosis without current pathological fracture: Secondary | ICD-10-CM | POA: Diagnosis not present

## 2012-07-28 DIAGNOSIS — G319 Degenerative disease of nervous system, unspecified: Secondary | ICD-10-CM | POA: Diagnosis not present

## 2012-07-28 DIAGNOSIS — E785 Hyperlipidemia, unspecified: Secondary | ICD-10-CM | POA: Diagnosis not present

## 2012-08-11 DIAGNOSIS — I10 Essential (primary) hypertension: Secondary | ICD-10-CM | POA: Diagnosis not present

## 2012-08-13 DIAGNOSIS — M1710 Unilateral primary osteoarthritis, unspecified knee: Secondary | ICD-10-CM | POA: Diagnosis not present

## 2012-09-22 DIAGNOSIS — M25569 Pain in unspecified knee: Secondary | ICD-10-CM | POA: Diagnosis not present

## 2012-11-06 DIAGNOSIS — C73 Malignant neoplasm of thyroid gland: Secondary | ICD-10-CM | POA: Diagnosis not present

## 2012-11-06 DIAGNOSIS — Z23 Encounter for immunization: Secondary | ICD-10-CM | POA: Diagnosis not present

## 2012-11-06 DIAGNOSIS — G609 Hereditary and idiopathic neuropathy, unspecified: Secondary | ICD-10-CM | POA: Diagnosis not present

## 2012-11-06 DIAGNOSIS — I10 Essential (primary) hypertension: Secondary | ICD-10-CM | POA: Diagnosis not present

## 2012-11-06 DIAGNOSIS — E785 Hyperlipidemia, unspecified: Secondary | ICD-10-CM | POA: Diagnosis not present

## 2012-11-06 DIAGNOSIS — G319 Degenerative disease of nervous system, unspecified: Secondary | ICD-10-CM | POA: Diagnosis not present

## 2012-11-06 DIAGNOSIS — M810 Age-related osteoporosis without current pathological fracture: Secondary | ICD-10-CM | POA: Diagnosis not present

## 2012-12-17 ENCOUNTER — Other Ambulatory Visit: Payer: Self-pay | Admitting: Internal Medicine

## 2012-12-17 DIAGNOSIS — Z1231 Encounter for screening mammogram for malignant neoplasm of breast: Secondary | ICD-10-CM

## 2013-01-06 ENCOUNTER — Ambulatory Visit
Admission: RE | Admit: 2013-01-06 | Discharge: 2013-01-06 | Disposition: A | Discharge status: Home / Self Care | Payer: Medicare Other | Source: Ambulatory Visit | Attending: Internal Medicine | Admitting: Internal Medicine

## 2013-01-06 DIAGNOSIS — Z1231 Encounter for screening mammogram for malignant neoplasm of breast: Secondary | ICD-10-CM

## 2013-01-08 ENCOUNTER — Other Ambulatory Visit: Payer: Self-pay | Admitting: Internal Medicine

## 2013-01-08 DIAGNOSIS — R928 Other abnormal and inconclusive findings on diagnostic imaging of breast: Secondary | ICD-10-CM

## 2013-01-16 ENCOUNTER — Ambulatory Visit
Admission: RE | Admit: 2013-01-16 | Discharge: 2013-01-16 | Disposition: A | Discharge status: Home / Self Care | Payer: Medicare Other | Source: Ambulatory Visit | Attending: Internal Medicine | Admitting: Internal Medicine

## 2013-01-16 ENCOUNTER — Other Ambulatory Visit: Payer: Self-pay | Admitting: Internal Medicine

## 2013-01-16 DIAGNOSIS — R928 Other abnormal and inconclusive findings on diagnostic imaging of breast: Secondary | ICD-10-CM

## 2013-01-16 DIAGNOSIS — N63 Unspecified lump in unspecified breast: Secondary | ICD-10-CM | POA: Diagnosis not present

## 2013-01-16 LAB — HM MAMMOGRAPHY

## 2013-02-05 DIAGNOSIS — I10 Essential (primary) hypertension: Secondary | ICD-10-CM | POA: Diagnosis not present

## 2013-02-05 DIAGNOSIS — M810 Age-related osteoporosis without current pathological fracture: Secondary | ICD-10-CM | POA: Diagnosis not present

## 2013-02-05 DIAGNOSIS — G319 Degenerative disease of nervous system, unspecified: Secondary | ICD-10-CM | POA: Diagnosis not present

## 2013-02-05 DIAGNOSIS — C73 Malignant neoplasm of thyroid gland: Secondary | ICD-10-CM | POA: Diagnosis not present

## 2013-02-09 DIAGNOSIS — Z961 Presence of intraocular lens: Secondary | ICD-10-CM | POA: Diagnosis not present

## 2013-02-11 DIAGNOSIS — C73 Malignant neoplasm of thyroid gland: Secondary | ICD-10-CM | POA: Diagnosis not present

## 2013-02-11 DIAGNOSIS — E890 Postprocedural hypothyroidism: Secondary | ICD-10-CM | POA: Diagnosis not present

## 2013-03-20 ENCOUNTER — Other Ambulatory Visit: Payer: Self-pay | Admitting: Geriatric Medicine

## 2013-03-20 DIAGNOSIS — I10 Essential (primary) hypertension: Secondary | ICD-10-CM

## 2013-03-20 DIAGNOSIS — E785 Hyperlipidemia, unspecified: Secondary | ICD-10-CM

## 2013-03-20 DIAGNOSIS — M810 Age-related osteoporosis without current pathological fracture: Secondary | ICD-10-CM

## 2013-04-18 ENCOUNTER — Other Ambulatory Visit: Payer: Self-pay | Admitting: Internal Medicine

## 2013-04-29 DIAGNOSIS — M1710 Unilateral primary osteoarthritis, unspecified knee: Secondary | ICD-10-CM | POA: Diagnosis not present

## 2013-06-04 ENCOUNTER — Other Ambulatory Visit: Payer: Medicare Other

## 2013-06-04 DIAGNOSIS — M810 Age-related osteoporosis without current pathological fracture: Secondary | ICD-10-CM

## 2013-06-04 DIAGNOSIS — I10 Essential (primary) hypertension: Secondary | ICD-10-CM

## 2013-06-04 DIAGNOSIS — E785 Hyperlipidemia, unspecified: Secondary | ICD-10-CM

## 2013-06-05 ENCOUNTER — Other Ambulatory Visit: Payer: Self-pay | Admitting: Internal Medicine

## 2013-06-05 ENCOUNTER — Encounter: Payer: Self-pay | Admitting: *Deleted

## 2013-06-05 DIAGNOSIS — E559 Vitamin D deficiency, unspecified: Secondary | ICD-10-CM

## 2013-06-05 LAB — BASIC METABOLIC PANEL
BUN/Creatinine Ratio: 21 (ref 11–26)
BUN: 25 mg/dL (ref 8–27)
CO2: 23 mmol/L (ref 19–28)
Calcium: 9.4 mg/dL (ref 8.6–10.2)
Chloride: 108 mmol/L (ref 97–108)
Creatinine, Ser: 1.17 mg/dL — ABNORMAL HIGH (ref 0.57–1.00)
GFR calc Af Amer: 49 mL/min/{1.73_m2} — ABNORMAL LOW (ref >59)
GFR calc non Af Amer: 43 mL/min/{1.73_m2} — ABNORMAL LOW (ref >59)
Glucose: 90 mg/dL (ref 65–99)
Potassium: 4.4 mmol/L (ref 3.5–5.2)
Sodium: 146 mmol/L — ABNORMAL HIGH (ref 134–144)

## 2013-06-05 LAB — VITAMIN D 25 HYDROXY (VIT D DEFICIENCY, FRACTURES): Vit D, 25-Hydroxy: 15.5 ng/mL — ABNORMAL LOW (ref 30.0–100.0)

## 2013-06-05 MED ORDER — VITAMIN D3 50 MCG (2000 UT) PO CAPS: 2000.0000 [IU] | ORAL_CAPSULE | Freq: Every day | ORAL | Status: DC

## 2013-06-08 ENCOUNTER — Ambulatory Visit (INDEPENDENT_AMBULATORY_CARE_PROVIDER_SITE_OTHER): Payer: Medicare Other | Admitting: Internal Medicine

## 2013-06-08 ENCOUNTER — Encounter: Payer: Self-pay | Admitting: Internal Medicine

## 2013-06-08 VITALS — BP 132/72 | HR 82 | Temp 98.1°F | Resp 16 | Ht 62.0 in | Wt 169.0 lb

## 2013-06-08 DIAGNOSIS — M1711 Unilateral primary osteoarthritis, right knee: Secondary | ICD-10-CM

## 2013-06-08 DIAGNOSIS — F0280 Dementia in other diseases classified elsewhere without behavioral disturbance: Secondary | ICD-10-CM

## 2013-06-08 DIAGNOSIS — I10 Essential (primary) hypertension: Secondary | ICD-10-CM

## 2013-06-08 DIAGNOSIS — E039 Hypothyroidism, unspecified: Secondary | ICD-10-CM | POA: Diagnosis not present

## 2013-06-08 DIAGNOSIS — E785 Hyperlipidemia, unspecified: Secondary | ICD-10-CM

## 2013-06-08 DIAGNOSIS — G309 Alzheimer's disease, unspecified: Secondary | ICD-10-CM

## 2013-06-08 DIAGNOSIS — M810 Age-related osteoporosis without current pathological fracture: Secondary | ICD-10-CM | POA: Diagnosis not present

## 2013-06-08 DIAGNOSIS — M1710 Unilateral primary osteoarthritis, unspecified knee: Secondary | ICD-10-CM

## 2013-06-08 DIAGNOSIS — IMO0002 Reserved for concepts with insufficient information to code with codable children: Secondary | ICD-10-CM

## 2013-06-08 MED ORDER — LEVOTHYROXINE SODIUM 112 MCG PO TABS: 112.0000 ug | ORAL_TABLET | Freq: Every day | ORAL | Status: DC

## 2013-06-08 MED ORDER — DICLOFENAC SODIUM 1 % TD GEL: 4.0000 g | Freq: Four times a day (QID) | TRANSDERMAL | Status: DC

## 2013-06-08 NOTE — Progress Notes (Signed)
Failed clock drawing. Given by Conception Chancy, CMA.

## 2013-06-08 NOTE — Assessment & Plan Note (Signed)
BP is at goal with current meds.  Amlodipine, atenolol and lisinopril.

## 2013-06-08 NOTE — Assessment & Plan Note (Addendum)
On levothyroxine.  Has been stable.  Check TSH next visit.

## 2013-06-08 NOTE — Progress Notes (Signed)
Patient ID: Shelia Marks, female   DOB: June 10, 1926, 79 y.o.   MRN: [id number]   No Known Allergies  Chief Complaint  Patient presents with  . Annual Exam    patient has a cold    HPI: Patient is a 79 y.o. AA female seen in the office today for annual physical and to review chronic medical conditions including Alzheimer's disease, htn, hyperlipidemia, senile osteoporosis, vitamin D deficiency and hypothyroidism (is s/p surgery for thyroid cancer).    Has cold.  Only sits in house, she says.  Has a little cough and congestion.  Is improving--started last week.  No headache.  Has mostly had nasal congestion.  No ear pain or ringing.  Started with sore throat.  No fever here.  Had chills 2 days ago.  Has been using cough drops, but no other medications.    Took tramadol for her knee (but made nauseated), but now using tylenol with codeine for this.  Friend says the tylenol with codeine makes her sleepy.  Says balance is fair and she has not fallen.  Only takes when pain is very bad.    Review of Systems:  Review of Systems  Constitutional: Positive for malaise/fatigue. Negative for fever and chills.  HENT: Positive for congestion and sore throat.   Eyes: Negative for blurred vision.  Respiratory: Positive for cough. Negative for shortness of breath.   Cardiovascular: Negative for chest pain, palpitations and leg swelling.  Gastrointestinal: Negative for abdominal pain, constipation, blood in stool and melena.  Genitourinary: Negative for dysuria.  Musculoskeletal: Positive for joint pain. Negative for falls.  Skin: Negative for rash.  Neurological: Negative for dizziness and headaches.  Psychiatric/Behavioral: Positive for memory loss.     Past Medical History  Diagnosis Date  . Thyroid disease   . Cancer   . Hypertension   . Sleep related leg cramps   . Senile osteoporosis   . Malignant neoplasm of thyroid gland   . Nontoxic uninodular goiter   . Dizziness and giddiness   .  Other symptoms involving cardiovascular system   . Alzheimer's disease   . Varicose veins of lower extremities with inflammation   . Disorder of bone and cartilage, unspecified   . Other and unspecified hyperlipidemia   . Unspecified disorder of lipoid metabolism   . Unspecified hereditary and idiopathic peripheral neuropathy   . Phlebitis and thrombophlebitis of unspecified site   . Other malaise and fatigue    Past Surgical History  Procedure Laterality Date  . Abdominal hysterectomy      DR HUGES  . Knee surgery      right  . Colon surgery    . Thyroid surgery  July 20, 2011  . Parathyroid adenoma removed  1983  . Catract surgery  2008   Social History:   reports that she has never smoked. She has never used smokeless tobacco. She reports that she does not drink alcohol. Her drug history is not on file.  Family History  Problem Relation Age of Onset  . Diabetes Brother     Medications: Patient's Medications  New Prescriptions   No medications on file  Previous Medications   ACETAMINOPHEN-CODEINE (TYLENOL #3) 300-30 MG PER TABLET       ALLOPURINOL (ZYLOPRIM) 300 MG TABLET    Take 300 mg by mouth daily.     AMLODIPINE (NORVASC) 10 MG TABLET    Take 10 mg by mouth daily.     ATENOLOL (TENORMIN) 25 MG TABLET  Take 25 mg by mouth daily.     LEVOTHYROXINE (SYNTHROID, LEVOTHROID) 75 MCG TABLET    Take 75 mcg by mouth daily.    LISINOPRIL (PRINIVIL,ZESTRIL) 5 MG TABLET    TAKE 1 TABLET DAILY FOR BLOOD PRESSURE   SIMVASTATIN (ZOCOR) 40 MG TABLET    Take 40 mg by mouth at bedtime.    Modified Medications   No medications on file  Discontinued Medications   CHOLECALCIFEROL (VITAMIN D3) 2000 UNITS CAPSULE    Take 1 capsule (2,000 Units total) by mouth daily.     Physical Exam: Filed Vitals:   06/08/13 1017  BP: 132/72  Pulse: 82  Temp: 98.1 F (36.7 C)  TempSrc: Oral  Resp: 16  Height: 5\' 2"  (1.575 m)  Weight: 169 lb (76.658 kg)  SpO2: 99%   Physical Exam   Constitutional: She appears well-developed and well-nourished. No distress.  HENT:  Head: Normocephalic and atraumatic.  Right Ear: Hearing, tympanic membrane, external ear and ear canal normal.  Left Ear: Hearing, tympanic membrane, external ear and ear canal normal.  Nose: Nose normal.  Mouth/Throat: Oropharynx is clear and moist. No oropharyngeal exudate.  Eyes: Conjunctivae, EOM and lids are normal. Pupils are equal, round, and reactive to light.  Neck: Normal range of motion. Neck supple. No JVD present. No tracheal deviation present.  Cardiovascular: Normal rate, regular rhythm, normal heart sounds and intact distal pulses.   Pulmonary/Chest: Effort normal and breath sounds normal. No respiratory distress.  Abdominal: Soft. Bowel sounds are normal. She exhibits no distension and no mass. There is no tenderness. There is no rebound and no guarding.  Genitourinary:  Refused rectal exam  Musculoskeletal: Normal range of motion. She exhibits no edema and no tenderness.  Lymphadenopathy:    She has no cervical adenopathy.  Neurological: She is alert. No cranial nerve deficit.  Oriented to person and place, not time;  MMSE 23/30  Skin: Skin is warm and dry.  Psychiatric: She has a normal mood and affect. Her behavior is normal.      Labs reviewed: Basic Metabolic Panel:  Recent Labs  40/98/11 1001  NA 146*  K 4.4  CL 108  CO2 23  GLUCOSE 90  BUN 25  CREATININE 1.17*  CALCIUM 9.4   Past Procedures:   05/26/09 cscope 07/21/2011  Hospital lab CA 9.5 07/21/2011 Hospital lab Urine Rbc 0-2  10/23/2011  CBC: wbc 4.5, rbc 4.16, Hemoglobin 11.9 CMP: glucose 93, BUN 15, Creatinine 0.91 Lipid: Cholesterol 168, Triglycerides 84, HDL 58, LDL 93 Vitamin D: 91.4 12/07/11:  Bone density with osteoporosis 04/28/2012 BMP Glucose 89 Bun 17 Creatinine 0.75  TSH 0.712   01/16/13:  mammogram initially concerning for lump on left and one on right, but diagnostic with  ultrasound was  normal 06/08/13:  MMSE:  23/30 missing points on orientation to time, attention, recall and copying, failed clock  Assessment/Plan 1. Hypothyroidism -stable, recheck labs due to decline in mmse - TSH; Future  2. Hypertension -at goal with current therapy--acei and amlodipine, seems to be adherent with this  3. Senile osteoporosis -cont ca with D supplement, encouraged her to get out and walk for weightbearing exercise  4. Hyperlipidemia LDL goal < 100 -cont statin therapy, no ill effects noted  5. Alzheimer's disease -progressive--I have been unable to convince her to take medication for her memory loss--she is convinced she does not have any problems --would recommend namenda xr and then adding donepezil later on to help avoid GI side effects  6. Osteoarthritis of right knee -encouraged mobility to lubricate the joint, tylenol, heat or ice - prescribed diclofenac sodium (VOLTAREN) 1 % GEL; Apply 4 g topically 4 (four) times daily.  Dispense: 100 g; Refill: 3  Labs/tests ordered:  TSH

## 2013-06-08 NOTE — Assessment & Plan Note (Signed)
MMSE done today 23/30 and failed clock drawing vs. 24/30 on 10/26/11.

## 2013-06-08 NOTE — Patient Instructions (Signed)
Vitamin D3 2000 units daily for low vitamin D levels.

## 2013-07-16 ENCOUNTER — Other Ambulatory Visit: Payer: Self-pay | Admitting: Geriatric Medicine

## 2013-07-16 MED ORDER — LEVOTHYROXINE SODIUM 112 MCG PO TABS: 112.0000 ug | ORAL_TABLET | Freq: Every day | ORAL | Status: DC

## 2013-07-16 MED ORDER — LISINOPRIL 5 MG PO TABS: 5.0000 mg | ORAL_TABLET | Freq: Every day | ORAL | Status: DC

## 2013-09-08 ENCOUNTER — Other Ambulatory Visit: Payer: Self-pay | Admitting: *Deleted

## 2013-09-08 MED ORDER — SIMVASTATIN 40 MG PO TABS: 40.0000 mg | ORAL_TABLET | Freq: Every day | ORAL | Status: DC

## 2013-09-08 MED ORDER — ATENOLOL 25 MG PO TABS: 25.0000 mg | ORAL_TABLET | Freq: Every day | ORAL | Status: DC

## 2013-09-08 MED ORDER — ALLOPURINOL 300 MG PO TABS: 300.0000 mg | ORAL_TABLET | Freq: Every day | ORAL | Status: DC

## 2013-09-08 MED ORDER — AMLODIPINE BESYLATE 10 MG PO TABS: 10.0000 mg | ORAL_TABLET | Freq: Every day | ORAL | Status: DC

## 2013-09-08 MED ORDER — LISINOPRIL 5 MG PO TABS: 5.0000 mg | ORAL_TABLET | Freq: Every day | ORAL | Status: DC

## 2013-10-12 ENCOUNTER — Ambulatory Visit (INDEPENDENT_AMBULATORY_CARE_PROVIDER_SITE_OTHER): Payer: Medicare Other | Admitting: Internal Medicine

## 2013-10-12 ENCOUNTER — Encounter: Payer: Self-pay | Admitting: Internal Medicine

## 2013-10-12 VITALS — BP 132/80 | HR 70 | Temp 97.9°F | Wt 166.0 lb

## 2013-10-12 DIAGNOSIS — Z23 Encounter for immunization: Secondary | ICD-10-CM

## 2013-10-12 DIAGNOSIS — E559 Vitamin D deficiency, unspecified: Secondary | ICD-10-CM

## 2013-10-12 DIAGNOSIS — E039 Hypothyroidism, unspecified: Secondary | ICD-10-CM

## 2013-10-12 DIAGNOSIS — I10 Essential (primary) hypertension: Secondary | ICD-10-CM | POA: Diagnosis not present

## 2013-10-12 DIAGNOSIS — E785 Hyperlipidemia, unspecified: Secondary | ICD-10-CM

## 2013-10-12 NOTE — Progress Notes (Signed)
Patient ID: Shelia Marks, female   DOB: 1926-05-07, 79 y.o.   MRN: [id number] Location:  [facility] [place] [facility]  Code Status: DNR   No Known Allergies  Chief Complaint  Patient presents with  . Medical Managment of Chronic Issues    4 month follow-up     HPI: Patient is a 79 y.o. black female seen in the office today for f/u of chronic conditions including hypothyroidism s/p thyroidectomy for thyroid ca, urge urinary incontinence, alzheimer's disease, and hyperlipidemia.  She is doing well.  She has no complaints she wants addressed.    Review of Systems:  Review of Systems  Constitutional: Negative for malaise/fatigue.  HENT: Negative for congestion.   Eyes: Positive for blurred vision.       Does not wear glasses like she should she says  Respiratory: Negative for cough and shortness of breath.   Cardiovascular: Negative for chest pain.  Gastrointestinal: Positive for constipation. Negative for blood in stool and melena.  Genitourinary:       Some urge incontinence--wears a pad if she goes out;  No leakage with cough or sneeze  Musculoskeletal: Negative for falls.  Neurological: Negative for dizziness and headaches.       Uses cane  Psychiatric/Behavioral: Positive for memory loss. The patient has insomnia.      Past Medical History  Diagnosis Date  . Hypothyroidism   . Cancer   . Hypertension   . Sleep related leg cramps   . Senile osteoporosis   . Malignant neoplasm of thyroid gland   . Nontoxic uninodular goiter   . Dizziness and giddiness   . Other symptoms involving cardiovascular system   . Alzheimer's disease   . Varicose veins of lower extremities with inflammation   . Disorder of bone and cartilage, unspecified   . Hyperlipidemia LDL goal < 100   . Unspecified disorder of lipoid metabolism   . Unspecified hereditary and idiopathic peripheral neuropathy   . Phlebitis and thrombophlebitis of unspecified site   . Other  malaise and fatigue     Past Surgical History  Procedure Laterality Date  . Abdominal hysterectomy      DR HUGES  . Knee surgery      right  . Colon surgery    . Thyroid surgery  July 20, 2011  . Parathyroid adenoma removed  1983  . Catract surgery  2008    Social History:   reports that she has never smoked. She has never used smokeless tobacco. She reports that she does not drink alcohol or use illicit drugs.  Family History  Problem Relation Age of Onset  . Diabetes Brother     Medications: Patient's Medications  New Prescriptions   No medications on file  Previous Medications   ACETAMINOPHEN-CODEINE (TYLENOL #3) 300-30 MG PER TABLET       ALLOPURINOL (ZYLOPRIM) 300 MG TABLET    Take 1 tablet (300 mg total) by mouth daily.   AMLODIPINE (NORVASC) 10 MG TABLET    Take 1 tablet (10 mg total) by mouth daily.   ATENOLOL (TENORMIN) 25 MG TABLET    Take 1 tablet (25 mg total) by mouth daily.   DICLOFENAC SODIUM (VOLTAREN) 1 % GEL    Apply 4 g topically 4 (four) times daily.   LEVOTHYROXINE (SYNTHROID, LEVOTHROID) 112 MCG TABLET    Take 1 tablet (112 mcg total) by mouth daily.   LISINOPRIL (PRINIVIL,ZESTRIL) 5 MG TABLET    Take 1 tablet (5  mg total) by mouth daily.   SIMVASTATIN (ZOCOR) 40 MG TABLET    Take 1 tablet (40 mg total) by mouth at bedtime.  Modified Medications   No medications on file  Discontinued Medications   No medications on file     Physical Exam: Filed Vitals:   10/12/13 0948  BP: 132/80  Pulse: 70  Temp: 97.9 F (36.6 C)  TempSrc: Oral  Weight: 166 lb (75.297 kg)  SpO2: 96%  Physical Exam  Constitutional: She appears well-developed and well-nourished. No distress.  HENT:  Head: Normocephalic and atraumatic.  Neck:  Scar from thyroidectomy  Cardiovascular: Normal rate, regular rhythm, normal heart sounds and intact distal pulses.   Pulmonary/Chest: Effort normal and breath sounds normal. No respiratory distress.  Abdominal: Soft. Bowel sounds  are normal. She exhibits no distension. There is no tenderness.  Musculoskeletal: Normal range of motion.  Stiff when stood up, slight limp due to knee pain, but refuses intervention  Neurological: She is alert.  Oriented to person and place, not time  Skin: Skin is warm and dry.     Labs reviewed: Basic Metabolic Panel:  Recent Labs  16/10/96 1001  NA 146*  K 4.4  CL 108  CO2 23  GLUCOSE 90  BUN 25  CREATININE 1.17*  CALCIUM 9.4   Assessment/Plan 1. Need for prophylactic vaccination and inoculation against influenza -given flu shot  2. Essential hypertension, benign -check bmp  3. Hypothyroidism - f/u TSH--is taking medication as directed  4.Hyperlipidemia -lipids could not be checked due to nonfasting -cont zocor  5.  Vitamin D deficiency -says she is taking her vitamin D 2000 units daily   Labs/tests ordered:  Bmp, tsh Next appt:  6 mos for EV with MMSE

## 2013-10-13 ENCOUNTER — Other Ambulatory Visit: Payer: Self-pay | Admitting: Internal Medicine

## 2013-10-13 DIAGNOSIS — E890 Postprocedural hypothyroidism: Secondary | ICD-10-CM

## 2013-10-13 LAB — BASIC METABOLIC PANEL
BUN/Creatinine Ratio: 18 (ref 11–26)
BUN: 17 mg/dL (ref 8–27)
CO2: 25 mmol/L (ref 18–29)
Calcium: 10.1 mg/dL (ref 8.6–10.2)
Chloride: 106 mmol/L (ref 97–108)
Creatinine, Ser: 0.92 mg/dL (ref 0.57–1.00)
GFR calc Af Amer: 66 mL/min/{1.73_m2} (ref >59)
GFR calc non Af Amer: 57 mL/min/{1.73_m2} — ABNORMAL LOW (ref >59)
Glucose: 90 mg/dL (ref 65–99)
Potassium: 4.5 mmol/L (ref 3.5–5.2)
Sodium: 146 mmol/L — ABNORMAL HIGH (ref 134–144)

## 2013-10-13 LAB — TSH: TSH: 0.366 u[IU]/mL — ABNORMAL LOW (ref 0.450–4.500)

## 2013-10-13 MED ORDER — LEVOTHYROXINE SODIUM 100 MCG PO TABS: 100.0000 ug | ORAL_TABLET | Freq: Every day | ORAL | Status: DC

## 2013-12-18 ENCOUNTER — Other Ambulatory Visit: Payer: Self-pay

## 2013-12-18 DIAGNOSIS — Z1231 Encounter for screening mammogram for malignant neoplasm of breast: Secondary | ICD-10-CM

## 2014-01-18 ENCOUNTER — Ambulatory Visit
Admission: RE | Admit: 2014-01-18 | Discharge: 2014-01-18 | Disposition: A | Discharge status: Home / Self Care | Payer: Medicare Other | Source: Ambulatory Visit

## 2014-01-18 DIAGNOSIS — Z1231 Encounter for screening mammogram for malignant neoplasm of breast: Secondary | ICD-10-CM | POA: Diagnosis not present

## 2014-02-11 DIAGNOSIS — E890 Postprocedural hypothyroidism: Secondary | ICD-10-CM | POA: Diagnosis not present

## 2014-02-11 DIAGNOSIS — C73 Malignant neoplasm of thyroid gland: Secondary | ICD-10-CM | POA: Diagnosis not present

## 2014-03-05 ENCOUNTER — Encounter (HOSPITAL_COMMUNITY): Payer: Self-pay | Admitting: Emergency Medicine

## 2014-03-05 ENCOUNTER — Emergency Department (HOSPITAL_COMMUNITY): Payer: Medicare Other

## 2014-03-05 ENCOUNTER — Inpatient Hospital Stay (HOSPITAL_COMMUNITY)
Admission: EM | Admit: 2014-03-05 | Discharge: 2014-03-08 | DRG: 065 | Disposition: A | Discharge status: Rehabilitation Facility | Payer: Medicare Other | Attending: Internal Medicine | Admitting: Internal Medicine

## 2014-03-05 DIAGNOSIS — I059 Rheumatic mitral valve disease, unspecified: Secondary | ICD-10-CM | POA: Diagnosis not present

## 2014-03-05 DIAGNOSIS — R42 Dizziness and giddiness: Secondary | ICD-10-CM | POA: Diagnosis not present

## 2014-03-05 DIAGNOSIS — G8190 Hemiplegia, unspecified affecting unspecified side: Secondary | ICD-10-CM | POA: Diagnosis present

## 2014-03-05 DIAGNOSIS — Z79899 Other long term (current) drug therapy: Secondary | ICD-10-CM

## 2014-03-05 DIAGNOSIS — R1310 Dysphagia, unspecified: Secondary | ICD-10-CM | POA: Diagnosis present

## 2014-03-05 DIAGNOSIS — S0993XA Unspecified injury of face, initial encounter: Secondary | ICD-10-CM | POA: Diagnosis not present

## 2014-03-05 DIAGNOSIS — Z5189 Encounter for other specified aftercare: Secondary | ICD-10-CM | POA: Diagnosis not present

## 2014-03-05 DIAGNOSIS — Z85850 Personal history of malignant neoplasm of thyroid: Secondary | ICD-10-CM

## 2014-03-05 DIAGNOSIS — I10 Essential (primary) hypertension: Secondary | ICD-10-CM | POA: Diagnosis not present

## 2014-03-05 DIAGNOSIS — K5900 Constipation, unspecified: Secondary | ICD-10-CM | POA: Diagnosis not present

## 2014-03-05 DIAGNOSIS — I6789 Other cerebrovascular disease: Secondary | ICD-10-CM | POA: Diagnosis present

## 2014-03-05 DIAGNOSIS — E785 Hyperlipidemia, unspecified: Secondary | ICD-10-CM | POA: Diagnosis not present

## 2014-03-05 DIAGNOSIS — R29810 Facial weakness: Secondary | ICD-10-CM | POA: Diagnosis present

## 2014-03-05 DIAGNOSIS — R29818 Other symptoms and signs involving the nervous system: Secondary | ICD-10-CM | POA: Diagnosis not present

## 2014-03-05 DIAGNOSIS — S0990XA Unspecified injury of head, initial encounter: Secondary | ICD-10-CM | POA: Diagnosis not present

## 2014-03-05 DIAGNOSIS — I639 Cerebral infarction, unspecified: Secondary | ICD-10-CM | POA: Diagnosis present

## 2014-03-05 DIAGNOSIS — I6350 Cerebral infarction due to unspecified occlusion or stenosis of unspecified cerebral artery: Secondary | ICD-10-CM | POA: Diagnosis not present

## 2014-03-05 DIAGNOSIS — I69991 Dysphagia following unspecified cerebrovascular disease: Secondary | ICD-10-CM | POA: Diagnosis not present

## 2014-03-05 DIAGNOSIS — F0280 Dementia in other diseases classified elsewhere without behavioral disturbance: Secondary | ICD-10-CM | POA: Diagnosis present

## 2014-03-05 DIAGNOSIS — G309 Alzheimer's disease, unspecified: Secondary | ICD-10-CM | POA: Diagnosis present

## 2014-03-05 DIAGNOSIS — I69920 Aphasia following unspecified cerebrovascular disease: Secondary | ICD-10-CM | POA: Diagnosis not present

## 2014-03-05 DIAGNOSIS — R4789 Other speech disturbances: Secondary | ICD-10-CM | POA: Diagnosis present

## 2014-03-05 DIAGNOSIS — R404 Transient alteration of awareness: Secondary | ICD-10-CM | POA: Diagnosis not present

## 2014-03-05 DIAGNOSIS — R4701 Aphasia: Secondary | ICD-10-CM | POA: Diagnosis present

## 2014-03-05 DIAGNOSIS — I6330 Cerebral infarction due to thrombosis of unspecified cerebral artery: Secondary | ICD-10-CM | POA: Diagnosis not present

## 2014-03-05 DIAGNOSIS — E039 Hypothyroidism, unspecified: Secondary | ICD-10-CM

## 2014-03-05 HISTORY — DX: Cerebral infarction, unspecified: I63.9

## 2014-03-05 LAB — COMPREHENSIVE METABOLIC PANEL
ALT: 12 U/L (ref 0–35)
ALT: 9 U/L (ref 0–35)
AST: 19 U/L (ref 0–37)
AST: 16 U/L (ref 0–37)
Albumin: 4 g/dL (ref 3.5–5.2)
Albumin: 3.3 g/dL — ABNORMAL LOW (ref 3.5–5.2)
Alkaline Phosphatase: 80 U/L (ref 39–117)
Alkaline Phosphatase: 67 U/L (ref 39–117)
BUN: 24 mg/dL — ABNORMAL HIGH (ref 6–23)
BUN: 20 mg/dL (ref 6–23)
CO2: 23 mEq/L (ref 19–32)
CO2: 23 mEq/L (ref 19–32)
Calcium: 10.1 mg/dL (ref 8.4–10.5)
Calcium: 9.6 mg/dL (ref 8.4–10.5)
Chloride: 105 mEq/L (ref 96–112)
Chloride: 108 mEq/L (ref 96–112)
Creatinine, Ser: 0.98 mg/dL (ref 0.50–1.10)
Creatinine, Ser: 0.94 mg/dL (ref 0.50–1.10)
GFR calc Af Amer: 59 mL/min — ABNORMAL LOW (ref >90)
GFR calc Af Amer: 62 mL/min — ABNORMAL LOW (ref >90)
GFR calc non Af Amer: 51 mL/min — ABNORMAL LOW (ref >90)
GFR calc non Af Amer: 54 mL/min — ABNORMAL LOW (ref >90)
Glucose, Bld: 98 mg/dL (ref 70–99)
Glucose, Bld: 87 mg/dL (ref 70–99)
Potassium: 3.6 mEq/L — ABNORMAL LOW (ref 3.7–5.3)
Potassium: 3.5 mEq/L — ABNORMAL LOW (ref 3.7–5.3)
Sodium: 143 mEq/L (ref 137–147)
Sodium: 143 mEq/L (ref 137–147)
Total Bilirubin: 0.6 mg/dL (ref 0.3–1.2)
Total Bilirubin: 0.6 mg/dL (ref 0.3–1.2)
Total Protein: 7.8 g/dL (ref 6.0–8.3)
Total Protein: 7.1 g/dL (ref 6.0–8.3)

## 2014-03-05 LAB — CBC WITH DIFFERENTIAL/PLATELET
Basophils Absolute: 0 10*3/uL (ref 0.0–0.1)
Basophils Relative: 1 % (ref 0–1)
Eosinophils Absolute: 0.1 10*3/uL (ref 0.0–0.7)
Eosinophils Relative: 2 % (ref 0–5)
HCT: 34.9 % — ABNORMAL LOW (ref 36.0–46.0)
Hemoglobin: 11.3 g/dL — ABNORMAL LOW (ref 12.0–15.0)
Lymphocytes Relative: 33 % (ref 12–46)
Lymphs Abs: 1.2 10*3/uL (ref 0.7–4.0)
MCH: 30 pg (ref 26.0–34.0)
MCHC: 32.4 g/dL (ref 30.0–36.0)
MCV: 92.6 fL (ref 78.0–100.0)
Monocytes Absolute: 0.3 10*3/uL (ref 0.1–1.0)
Monocytes Relative: 7 % (ref 3–12)
Neutro Abs: 2.1 10*3/uL (ref 1.7–7.7)
Neutrophils Relative %: 58 % (ref 43–77)
Platelets: 152 10*3/uL (ref 150–400)
RBC: 3.77 MIL/uL — ABNORMAL LOW (ref 3.87–5.11)
RDW: 15.2 % (ref 11.5–15.5)
WBC: 3.6 10*3/uL — ABNORMAL LOW (ref 4.0–10.5)

## 2014-03-05 LAB — DIFFERENTIAL
Basophils Absolute: 0 10*3/uL (ref 0.0–0.1)
Basophils Relative: 1 % (ref 0–1)
Eosinophils Absolute: 0.2 10*3/uL (ref 0.0–0.7)
Eosinophils Relative: 3 % (ref 0–5)
Lymphocytes Relative: 37 % (ref 12–46)
Lymphs Abs: 2.3 10*3/uL (ref 0.7–4.0)
Monocytes Absolute: 0.4 10*3/uL (ref 0.1–1.0)
Monocytes Relative: 7 % (ref 3–12)
Neutro Abs: 3.3 10*3/uL (ref 1.7–7.7)
Neutrophils Relative %: 53 % (ref 43–77)

## 2014-03-05 LAB — URINALYSIS, ROUTINE W REFLEX MICROSCOPIC
Bilirubin Urine: NEGATIVE
Glucose, UA: NEGATIVE mg/dL
Hgb urine dipstick: NEGATIVE
Ketones, ur: NEGATIVE mg/dL
Nitrite: NEGATIVE
Protein, ur: NEGATIVE mg/dL
Specific Gravity, Urine: 1.013 (ref 1.005–1.030)
Urobilinogen, UA: 0.2 mg/dL (ref 0.0–1.0)
pH: 7 (ref 5.0–8.0)

## 2014-03-05 LAB — CBC
HCT: 39.1 % (ref 36.0–46.0)
Hemoglobin: 13 g/dL (ref 12.0–15.0)
MCH: 30.3 pg (ref 26.0–34.0)
MCHC: 33.2 g/dL (ref 30.0–36.0)
MCV: 91.1 fL (ref 78.0–100.0)
Platelets: 184 10*3/uL (ref 150–400)
RBC: 4.29 MIL/uL (ref 3.87–5.11)
RDW: 14.9 % (ref 11.5–15.5)
WBC: 6.2 10*3/uL (ref 4.0–10.5)

## 2014-03-05 LAB — I-STAT TROPONIN, ED: Troponin i, poc: 0.01 ng/mL (ref 0.00–0.08)

## 2014-03-05 LAB — TSH: TSH: 1.686 u[IU]/mL (ref 0.350–4.500)

## 2014-03-05 LAB — APTT: aPTT: 40 seconds — ABNORMAL HIGH (ref 24–37)

## 2014-03-05 LAB — URINE MICROSCOPIC-ADD ON

## 2014-03-05 LAB — PROTIME-INR
INR: 0.95 (ref 0.00–1.49)
Prothrombin Time: 12.5 seconds (ref 11.6–15.2)

## 2014-03-05 MED ORDER — SIMVASTATIN 40 MG PO TABS
40.0000 mg | ORAL_TABLET | Freq: Every day | ORAL | Status: DC
  Administered 2014-03-05: 40 mg via ORAL
  Filled 2014-03-05 (×3): qty 1

## 2014-03-05 MED ORDER — HYDROCODONE-ACETAMINOPHEN 5-325 MG PO TABS
1.0000 | ORAL_TABLET | Freq: Four times a day (QID) | ORAL | Status: DC | PRN
  Filled 2014-03-05: qty 1

## 2014-03-05 MED ORDER — SODIUM CHLORIDE 0.9 % IV SOLN
INTRAVENOUS | Status: DC
  Administered 2014-03-05: 75 mL/h via INTRAVENOUS
  Administered 2014-03-05: 08:00:00 via INTRAVENOUS
  Administered 2014-03-06: 75 mL/h via INTRAVENOUS
  Administered 2014-03-06: 22:00:00 via INTRAVENOUS

## 2014-03-05 MED ORDER — LISINOPRIL 5 MG PO TABS
5.0000 mg | ORAL_TABLET | Freq: Every day | ORAL | Status: DC
  Administered 2014-03-05 – 2014-03-08 (×4): 5 mg via ORAL
  Filled 2014-03-05 (×5): qty 1

## 2014-03-05 MED ORDER — AMLODIPINE BESYLATE 10 MG PO TABS
10.0000 mg | ORAL_TABLET | Freq: Every day | ORAL | Status: DC
  Administered 2014-03-05 – 2014-03-08 (×4): 10 mg via ORAL
  Filled 2014-03-05 (×5): qty 1

## 2014-03-05 MED ORDER — ALLOPURINOL 300 MG PO TABS
300.0000 mg | ORAL_TABLET | Freq: Every day | ORAL | Status: DC
  Administered 2014-03-05 – 2014-03-08 (×4): 300 mg via ORAL
  Filled 2014-03-05 (×5): qty 1

## 2014-03-05 MED ORDER — SODIUM CHLORIDE 0.9 % IV BOLUS (SEPSIS)
500.0000 mL | Freq: Once | INTRAVENOUS | Status: AC
  Administered 2014-03-05: 500 mL via INTRAVENOUS

## 2014-03-05 MED ORDER — CALCIUM CARBONATE ANTACID 500 MG PO CHEW
1.0000 | CHEWABLE_TABLET | ORAL | Status: DC | PRN
  Filled 2014-03-05: qty 1

## 2014-03-05 MED ORDER — SENNOSIDES-DOCUSATE SODIUM 8.6-50 MG PO TABS
1.0000 | ORAL_TABLET | Freq: Every evening | ORAL | Status: DC | PRN
  Filled 2014-03-05: qty 1

## 2014-03-05 MED ORDER — ATENOLOL 25 MG PO TABS
25.0000 mg | ORAL_TABLET | Freq: Every day | ORAL | Status: DC
  Administered 2014-03-05 – 2014-03-08 (×4): 25 mg via ORAL
  Filled 2014-03-05 (×5): qty 1

## 2014-03-05 MED ORDER — SODIUM CHLORIDE 0.9 % IV SOLN: INTRAVENOUS | Status: DC

## 2014-03-05 MED ORDER — ENOXAPARIN SODIUM 40 MG/0.4ML ~~LOC~~ SOLN
40.0000 mg | SUBCUTANEOUS | Status: DC
  Administered 2014-03-05 – 2014-03-08 (×4): 40 mg via SUBCUTANEOUS
  Filled 2014-03-05 (×6): qty 0.4

## 2014-03-05 MED ORDER — LEVOTHYROXINE SODIUM 100 MCG PO TABS
100.0000 ug | ORAL_TABLET | Freq: Every day | ORAL | Status: DC
  Administered 2014-03-05 – 2014-03-08 (×4): 100 ug via ORAL
  Filled 2014-03-05 (×7): qty 1

## 2014-03-05 MED ORDER — ASPIRIN 300 MG RE SUPP
300.0000 mg | Freq: Every day | RECTAL | Status: DC
  Filled 2014-03-05 (×4): qty 1

## 2014-03-05 MED ORDER — ASPIRIN 325 MG PO TABS
325.0000 mg | ORAL_TABLET | Freq: Every day | ORAL | Status: DC
Start: 2014-03-05 — End: 2014-03-08
  Administered 2014-03-05 – 2014-03-08 (×4): 325 mg via ORAL
  Filled 2014-03-05 (×5): qty 1

## 2014-03-05 NOTE — Progress Notes (Signed)
Utilization review completed. Ardian Haberland, RN, BSN. 

## 2014-03-05 NOTE — H&P (Signed)
[facility] History and Physical  Shelia Marks ZOX:[id number] DOB: 1926/06/16 DOA: 03/05/2014  Referring physician: ER physician. PCP: Hollace Kinnier, DO   Chief Complaint: Slurred speech and left lower extremity weakness.  HPI: Shelia Marks is a 80 y.o. female history of hypertension, hyperlipidemia, hypothyroidism presents to the ER because of slurred speech and left lower extremity weakness. As per patient's sister patient was complaining of some dizziness last evening when she was talking to her over the phone. She also stated that she had fallen. When she went to visit her she was limping on her left leg and later on she took her to her house. Later patient's sister felt patient had slurred speech and was brought to the ER. In the ER patient was found to have left facial droop and difficulty moving her left lower extremity. CT head did not show anything acute. On-call neurologist has been consulted and patient has been admitted for further management. Patient denies any headache blurred vision difficulty speaking or swallowing or any weakness of the upper extremities her right lower extremity. Patient denies any chest pain shortness of breath nausea vomiting or abdominal pain.   Review of Systems: As presented in the history of presenting illness, rest negative.  Past Medical History  Diagnosis Date  . Hypothyroidism   . Cancer   . Hypertension   . Sleep related leg cramps   . Senile osteoporosis   . Malignant neoplasm of thyroid gland   . Nontoxic uninodular goiter   . Dizziness and giddiness   . Other symptoms involving cardiovascular system   . Alzheimer's disease   . Varicose veins of lower extremities with inflammation   . Disorder of bone and cartilage, unspecified   . Hyperlipidemia LDL goal < 100   . Unspecified disorder of lipoid metabolism   . Unspecified hereditary and idiopathic peripheral neuropathy   . Phlebitis and thrombophlebitis of unspecified site    . Other malaise and fatigue    Past Surgical History  Procedure Laterality Date  . Abdominal hysterectomy      DR HUGES  . Knee surgery      right  . Colon surgery    . Thyroid surgery  July 20, 2011  . Parathyroid adenoma removed  1983  . Catract surgery  2008   Social History:  reports that she has never smoked. She has never used smokeless tobacco. She reports that she does not drink alcohol or use illicit drugs. Where does patient live home. Can patient participate in ADLs? Yes.  No Known Allergies  Family History:  Family History  Problem Relation Age of Onset  . Diabetes Brother       Prior to Admission medications   Medication Sig Start Date End Date Taking? Authorizing Provider  allopurinol (ZYLOPRIM) 300 MG tablet Take 1 tablet (300 mg total) by mouth daily. 09/08/13  Yes Pricilla Larsson, NP  amLODipine (NORVASC) 10 MG tablet Take 1 tablet (10 mg total) by mouth daily. 09/08/13  Yes Pricilla Larsson, NP  atenolol (TENORMIN) 25 MG tablet Take 1 tablet (25 mg total) by mouth daily. 09/08/13  Yes Pricilla Larsson, NP  calcium carbonate (TUMS - DOSED IN MG ELEMENTAL CALCIUM) 500 MG chewable tablet Chew 1 tablet by mouth as needed for indigestion or heartburn.   Yes Historical Provider, MD  levothyroxine (SYNTHROID, LEVOTHROID) 100 MCG tablet Take 1 tablet (100 mcg total) by mouth daily. On an empty stomach separate prior to other medications 10/13/13  Yes Tiffany  L Reed, DO  lisinopril (PRINIVIL,ZESTRIL) 5 MG tablet Take 1 tablet (5 mg total) by mouth daily. 09/08/13  Yes Pricilla Larsson, NP  simvastatin (ZOCOR) 40 MG tablet Take 1 tablet (40 mg total) by mouth at bedtime. 09/08/13  Yes Pricilla Larsson, NP    Physical Exam: Filed Vitals:   03/05/14 0445 03/05/14 0500 03/05/14 0615 03/05/14 0630  BP: 132/80 111/65  140/70  Pulse: 81 77  78  Temp:   98.2 F (36.8 C)   TempSrc:      Resp: 12 14  18   Height:      Weight:      SpO2: 100% 100%  100%     General:   Well-developed well-nourished.  Eyes: Anicteric no pallor.  ENT: No discharge from the ears eyes nose mouth.  Neck: No mass felt.  Cardiovascular: S1-S2 heard.  Respiratory: No rhonchi or crepitations.  Abdomen: Soft nontender bowel sounds present. No guarding or rigidity.  Skin: No rash.  Musculoskeletal: No edema. No obvious joint swellings.  Psychiatric: Appears normal.  Neurologic: Alert. Oriented to time place and person. Patient is unable to move her left lower extremity with strength of 1 x 5. Rest of extremities are 5 x 5. Patient left facial droop. Tongue is midline. PERRLA positive.  Labs on Admission:  Basic Metabolic Panel:  Recent Labs Lab 03/05/14 0414  NA 143  K 3.6*  CL 105  CO2 23  GLUCOSE 98  BUN 24*  CREATININE 0.98  CALCIUM 10.1   Liver Function Tests:  Recent Labs Lab 03/05/14 0414  AST 19  ALT 12  ALKPHOS 80  BILITOT 0.6  PROT 7.8  ALBUMIN 4.0   No results found for this basename: LIPASE, AMYLASE,  in the last 168 hours No results found for this basename: AMMONIA,  in the last 168 hours CBC:  Recent Labs Lab 03/05/14 0414  WBC 6.2  NEUTROABS 3.3  HGB 13.0  HCT 39.1  MCV 91.1  PLT 184   Cardiac Enzymes: No results found for this basename: CKTOTAL, CKMB, CKMBINDEX, TROPONINI,  in the last 168 hours  BNP (last 3 results) No results found for this basename: PROBNP,  in the last 8760 hours CBG: No results found for this basename: GLUCAP,  in the last 168 hours  Radiological Exams on Admission: Ct Head Wo Contrast  03/05/2014   CLINICAL DATA:  Fall yesterday.  Slurred speech.  EXAM: CT HEAD WITHOUT CONTRAST  TECHNIQUE: Contiguous axial images were obtained from the base of the skull through the vertex without intravenous contrast.  COMPARISON:  No comparison head CT.  Comparison neck CT 06/14/2014.  FINDINGS: No skull fracture or intracranial hemorrhage.  Small vessel disease type changes. Small remote appearing infarct central  pons. No CT evidence of large acute infarct.  No intracranial mass lesion noted on this unenhanced exam.  No hydrocephalus.  Vascular calcifications.  Mastoid air cells, middle ear cavities and visualized paranasal sinuses are clear.  Orbital structures unremarkable.  IMPRESSION: No intracranial hemorrhage or CT evidence of large acute infarct. Please see above.   Electronically Signed   By: Chauncey Cruel M.D.   On: 03/05/2014 05:54     Assessment/Plan Principal Problem:   CVA (cerebral vascular accident) Active Problems:   Hypothyroidism   Hypertension   Hyperlipidemia LDL goal < 100   CVA (cerebral infarction)   1. CVA - patient's symptoms are consistent with CVA. Patient's outside the TPA window. Patient has been placed on neurochecks,  swallow evaluation. Get MRI/MRA brain, 2-D echo and carotid Doppler. Monitor shows sinus rhythm. EKG is pending. 2. Hypertension - continue present medication. 3. Hyperlipidemia - continue present medication. 4. Hypothyroidism - check TSH. Continue Synthroid.    Code Status: Full code.  Family Communication: Discussed with patient's sister.  Disposition Plan: Admit to inpatient.    Jearl Soto N. [facility] Pager [phone].  If 7PM-7AM, please contact night-coverage [email or web address] Password [email or web address] 03/05/2014, 7:00 AM

## 2014-03-05 NOTE — Progress Notes (Signed)
[facility] notified that pt CVA symptoms has increased pt has developed left side facial droop and her left side weakness has gotten worse. bp is charted in epic was told to call a code Stroke rapid response notified will continue to monitor. Arthor Captain LPN.

## 2014-03-05 NOTE — ED Provider Notes (Signed)
CSN: [id number]     Arrival date & time 03/05/14  0355 History   First MD Initiated Contact with Patient 03/05/14 0401     Chief Complaint  Patient presents with  . Aphasia     (Consider location/radiation/quality/duration/timing/severity/associated sxs/prior Treatment) HPI Patient states she was in her normal state of health yesterday when around 3 or 4 PM she got dizzy. She describes this as room spinning. She then lost her balance and fell to the ground. She denies hitting her head or neck. There was no loss of consciousness. Since that time she's had left leg heaviness, slurred speech and difficulty with coordination. She denies any chest pain or shortness of breath again.  Past Medical History  Diagnosis Date  . Hypothyroidism   . Cancer   . Hypertension   . Sleep related leg cramps   . Senile osteoporosis   . Malignant neoplasm of thyroid gland   . Nontoxic uninodular goiter   . Dizziness and giddiness   . Other symptoms involving cardiovascular system   . Alzheimer's disease   . Varicose veins of lower extremities with inflammation   . Disorder of bone and cartilage, unspecified   . Hyperlipidemia LDL goal < 100   . Unspecified disorder of lipoid metabolism   . Unspecified hereditary and idiopathic peripheral neuropathy   . Phlebitis and thrombophlebitis of unspecified site   . Other malaise and fatigue    Past Surgical History  Procedure Laterality Date  . Abdominal hysterectomy      DR HUGES  . Knee surgery      right  . Colon surgery    . Thyroid surgery  July 20, 2011  . Parathyroid adenoma removed  1983  . Catract surgery  2008   Family History  Problem Relation Age of Onset  . Diabetes Brother    History  Substance Use Topics  . Smoking status: Never Smoker   . Smokeless tobacco: Never Used  . Alcohol Use: No   OB History   Grav Para Term Preterm Abortions TAB SAB Ect Mult Living                 Review of Systems  Constitutional: Negative for  fever and chills.  Respiratory: Negative for cough and shortness of breath.   Cardiovascular: Negative for chest pain.  Gastrointestinal: Negative for nausea, vomiting and abdominal pain.  Genitourinary: Negative for dysuria, frequency and flank pain.  Musculoskeletal: Negative for back pain, neck pain and neck stiffness.  Skin: Negative for rash and wound.  Neurological: Positive for dizziness and weakness. Negative for syncope, light-headedness, numbness and headaches.  All other systems reviewed and are negative.      Allergies  Review of patient's allergies indicates no known allergies.  Home Medications   Current Outpatient Rx  Name  Route  Sig  Dispense  Refill  . acetaminophen-codeine (TYLENOL #3) 300-30 MG per tablet               . allopurinol (ZYLOPRIM) 300 MG tablet   Oral   Take 1 tablet (300 mg total) by mouth daily.   90 tablet   3   . amLODipine (NORVASC) 10 MG tablet   Oral   Take 1 tablet (10 mg total) by mouth daily.   90 tablet   3   . atenolol (TENORMIN) 25 MG tablet   Oral   Take 1 tablet (25 mg total) by mouth daily.   90 tablet   3   .  diclofenac sodium (VOLTAREN) 1 % GEL   Topical   Apply 4 g topically 4 (four) times daily.   100 g   3   . levothyroxine (SYNTHROID, LEVOTHROID) 100 MCG tablet   Oral   Take 1 tablet (100 mcg total) by mouth daily. On an empty stomach separate prior to other medications   90 tablet   3   . lisinopril (PRINIVIL,ZESTRIL) 5 MG tablet   Oral   Take 1 tablet (5 mg total) by mouth daily.   90 tablet   3   . simvastatin (ZOCOR) 40 MG tablet   Oral   Take 1 tablet (40 mg total) by mouth at bedtime.   90 tablet   3    BP 145/74  Pulse 80  Temp(Src) 97.4 F (36.3 C) (Oral)  Resp 20  Ht 5\' 3"  (1.6 m)  Wt 167 lb (75.751 kg)  BMI 29.59 kg/m2  SpO2 100% Physical Exam  Nursing note and vitals reviewed. Constitutional: She is oriented to person, place, and time. She appears well-developed and  well-nourished. No distress.  HENT:  Head: Normocephalic and atraumatic.  Mouth/Throat: Oropharynx is clear and moist.  Eyes: EOM are normal. Pupils are equal, round, and reactive to light.  Rotary nystagmus  Neck: Normal range of motion. Neck supple.  Supple without meningismus. No posterior cervical midline tenderness. No bruits  Cardiovascular: Normal rate and regular rhythm.   Pulmonary/Chest: Effort normal and breath sounds normal. No respiratory distress. She has no wheezes. She has no rales.  Abdominal: Soft. Bowel sounds are normal. She exhibits no distension and no mass. There is no tenderness. There is no rebound and no guarding.  Musculoskeletal: Normal range of motion. She exhibits no edema and no tenderness.  No midline thoracic or lumbar tenderness.  Neurological: She is alert and oriented to person, place, and time.  4/5 motor in left lower extremity. 5/5 motor in all other extremities. Sensation to light touch is intact. Patient is mildly slurred speech. Coordination finger to nose testing and left upper extremity compared to right.  Skin: Skin is warm and dry. No rash noted. No erythema.  Psychiatric: She has a normal mood and affect. Her behavior is normal.    ED Course  Procedures (including critical care time) Labs Review Labs Reviewed  PROTIME-INR  APTT  CBC  DIFFERENTIAL  COMPREHENSIVE METABOLIC PANEL  URINALYSIS, ROUTINE W REFLEX MICROSCOPIC  I-STAT [place], ED   Imaging Review No results found.   EKG Interpretation None      Date: 03/05/2014  Rate: 85  Rhythm: normal sinus rhythm  QRS Axis: normal  Intervals: normal  ST/T Wave abnormalities: nonspecific T wave changes  Conduction Disutrbances:right bundle branch block  Narrative Interpretation:   Old EKG Reviewed: none available   MDM   Final diagnoses:  None    Patient with likely CVA. Her symptoms have been present for now greater than 12 hours. She's not a TPA candidate. We'll pursue  stroke workup and will need admission.  For Dr. Leonel Ramsay. Will see the patient in emergency department.  Discuss with hospitalist and will admit.  Julianne Rice, MD 03/05/14 [phone]

## 2014-03-05 NOTE — Progress Notes (Signed)
Called regarding worsening of the patient's symptoms. She has had some worsened dysarthria and left sided weakness compared to my prvious exam, but no clearly new deficits.   Will give 500cc bolus and obtain head CT, but I suspect that she has had mild extension of a small infarct, likely pontine.   Roland Rack, MD [facility] [phone]  If 7pm- 7am, please page neurology on call at [phone].

## 2014-03-05 NOTE — Significant Event (Addendum)
Rapid Response Event Note  Overview: Time Called: 2308 Arrival Time: 2310 Event Type: Neurologic  Initial Focused Assessment: Called by bedside RN regarding increase of stroke symptoms, Rogue Bussing NP requested a Code Stroke be called. I advised bedside RN to wait to call a Code Stroke until the patient had been assessed by myself or NP. Upon arrival to room, patient alert and oriented, left side weakness, minimal facial droop, and slight slurred speech. NIH 05. Patient has had symptoms since early this morning. Bedside RN stated patient's symptoms are worse than during her first assessment, but the symptoms were still present at the first assessment. Patient has not been back to a baseline this hospital admission. VSS.   Interventions: Admitting NP at bedside, neurology consulted. Patient is not a candidate for any acute treatment. Orders received for CT head, order for MRI pending.   Event Summary: Name of Physician Notified: Kathline Magic, NP at 2300  Name of Consulting Physician Notified: Dr. Leonel Ramsay at    Outcome: Stayed in room and stabalized  Event End Time: [place], Romona Curls

## 2014-03-05 NOTE — ED Notes (Signed)
Patient sustained fall yesterday afternoon around 1500. No injuries noted at that time, patient was ambulatory afterward. Patient fell in restroom and was able to get self off of floor. Returned to baseline until this AM when family was helping her to restroom. Patient reportedly was having some slurred speech at that time. Currently patient feels fine, no reports of pain, speech has returned to baseline. Family at bedside states that patient appears normal at this time.

## 2014-03-05 NOTE — Progress Notes (Signed)
Subjective: Patient seen and examined, admitted this morning  with slurred speech and left lower extremity weakness.2 echo done , carotid doppler, MRI brain is pending. C/o left side neck pain, as she fell yesterday. Filed Vitals:   03/05/14 1409  BP: 129/61  Pulse: 72  Temp: 98.7 F (37.1 C)  Resp: 16    Chest: Clear Bilaterally Heart : S1S2 RRR Abdomen: Soft, nontender Ext : No edema Neuro: Alert, oriented x 3  A/P Left lower extremity weakness Slurred speech Hypertension Hyperlipidemia  Agree with Dr Moise Boring plan, will follow the results of  imaging studies Continue aspirin. Will start Vicodin prn pain  [place] [facility]  [phone]

## 2014-03-05 NOTE — Consult Note (Addendum)
Neurology Consultation Reason for Consult: Left sided weakness Referring Physician: [name]  CC: Fall  History is obtained from: Patient, family member  HPI: Shelia Marks is a 80 y.o. female with a history of hypertension who presents with a fall yesterday around 3 PM. Since that time her family member has noticed slurred speech, and the patient has had leg weakness. She suspected that it was just due to her vertigo but because she was having present symptoms may cause her family member thought she saw some asymmetry in her face she presented to the ER tonight.   LKW: 3/5 3 PM tpa given?: no, outside of window NIHSS: 6   ROS: A 14 point ROS was performed and is negative except as noted in the HPI.  Past Medical History  Diagnosis Date  . Hypothyroidism   . Cancer   . Hypertension   . Sleep related leg cramps   . Senile osteoporosis   . Malignant neoplasm of thyroid gland   . Nontoxic uninodular goiter   . Dizziness and giddiness   . Other symptoms involving cardiovascular system   . Alzheimer's disease   . Varicose veins of lower extremities with inflammation   . Disorder of bone and cartilage, unspecified   . Hyperlipidemia LDL goal < 100   . Unspecified disorder of lipoid metabolism   . Unspecified hereditary and idiopathic peripheral neuropathy   . Phlebitis and thrombophlebitis of unspecified site   . Other malaise and fatigue     Family History: No history of stroke  Social History: Tob: Denies  Exam: Current vital signs: BP 145/74  Pulse 80  Temp(Src) 97.4 F (36.3 C) (Oral)  Resp 20  Ht 5\' 3"  (1.6 m)  Wt 75.751 kg (167 lb)  BMI 29.59 kg/m2  SpO2 100% Vital signs in last 24 hours: Temp:  [97.4 F (36.3 C)] 97.4 F (36.3 C) (03/06 0408) Pulse Rate:  [80-93] 80 (03/06 0424) Resp:  [20] 20 (03/06 0408) BP: (145-173)/(74-95) 145/74 mmHg (03/06 0424) SpO2:  [100 %] 100 % (03/06 0424) Weight:  [75.751 kg (167 lb)] 75.751 kg (167 lb)  (03/06 0411)  General: In bed, NAD CV: Regular rate and rhythm Mental Status: Patient is awake, alert, oriented to person, place, month, year. Patient is able to give a clear and coherent history. No signs of aphasia or neglect She is dysarthric. Cranial Nerves: II: Visual Fields are full. Pupils are equal, round, and reactive to light.  Discs are difficult to visualize. III,IV, VI: EOMI without ptosis or diploplia.  V: Facial sensation is symmetric to temperature VII: Facial movement is possible mild left facial weakness VIII: hearing is intact to voice X: Uvula elevates symmetrically XI: Shoulder shrug is symmetric. XII: tongue is midline without atrophy or fasciculations.  Motor: Tone is normal. Bulk is normal. 5/5 strength was present on the right. On the left she has mild 4/5 weakness in the arm and 2-3/5 weakness in the left leg.  Sensory: Sensation is symmetric to light touch and temperature in the arms and legs. Deep Tendon Reflexes: 1+ and symmetric in the biceps and patellae.  Cerebellar: FNF intact on right, ataxic on left Gait: Not assessed due to multiple medical monitors in ED setting.    I have reviewed labs in epic and the results pertinent to this consultation are: cmp-unremarkable  I have reviewed the images obtained: CT head - no hemorrhage  Impression: 80 yo F with likely a small subcortical infarct. At this time, she will  need to be admitted for stroke workup.   Recommendations: 1. HgbA1c, fasting lipid panel 2. MRI, MRA  of the brain without contrast 3. Frequent neuro checks 4. Echocardiogram 5. Carotid dopplers 6. Prophylactic therapy-Antiplatelet med: Aspirin - dose 325mg  7. Risk factor modification 8. Telemetry monitoring 9. PT consult, OT consult, Speech consult    Roland Rack, MD [facility] [phone]  If 7pm- 7am, please page neurology on call at [phone].

## 2014-03-05 NOTE — Progress Notes (Signed)
[facility] progress note. Chief complaint. Worsening stroke symptoms. History of present illness. This 80 year old female was admitted with slurred speech and left sided weakness. She was at her baseline when nursing indicated the patient's speech suddenly became worse with increased left-sided weakness and facial droop. Rapid response was notified and I came to the bedside to evaluate the patient in person. By the time I arrived the patient's speech was slightly slurred but intelligible and facial droop as well as left-sided weakness appeared to be back to baseline. While in route to the patient's room I saw Doctor Leonel Ramsay neurology who is following this case. Indicated the patient was not a candidate for intervention and he would review the patient seems possible. Vital signs. Temperature 98.7, pulse 83, respiration 18, blood pressure 155/89. O2 sats 100%. General appearance. Frail elderly female who is alert and in no distress. Cardiac. Rate and rhythm regular. Lungs. Breath sounds are clear. Abdomen. Soft with positive bowel sounds. Neurologic. Slight facial droop. There is some slight slurring of speech. Grip strength somewhat weaker on the left than the right. Left side leg significantly weaker than right and minimally able to define gravity. Impression/plan. Problem 1. Probable TIA. Symptoms noted by nursing appear to have self resolved within a matter of several minutes. I suspect the patient is back to previous baseline and rapid response agrees with this assessment. Doctor Leonel Ramsay of neurology has kindly consented to review the patient and I will follow for any further recommendations for orders.

## 2014-03-05 NOTE — Progress Notes (Signed)
  Echocardiogram 2D Echocardiogram has been performed.  Mauricio Po 03/05/2014, 3:07 PM

## 2014-03-06 ENCOUNTER — Encounter (HOSPITAL_COMMUNITY): Payer: Self-pay | Admitting: Radiology

## 2014-03-06 ENCOUNTER — Inpatient Hospital Stay (HOSPITAL_COMMUNITY): Payer: Medicare Other

## 2014-03-06 DIAGNOSIS — I6350 Cerebral infarction due to unspecified occlusion or stenosis of unspecified cerebral artery: Secondary | ICD-10-CM | POA: Diagnosis not present

## 2014-03-06 DIAGNOSIS — S0993XA Unspecified injury of face, initial encounter: Secondary | ICD-10-CM | POA: Diagnosis not present

## 2014-03-06 DIAGNOSIS — E785 Hyperlipidemia, unspecified: Secondary | ICD-10-CM | POA: Diagnosis not present

## 2014-03-06 DIAGNOSIS — R29818 Other symptoms and signs involving the nervous system: Secondary | ICD-10-CM | POA: Diagnosis not present

## 2014-03-06 DIAGNOSIS — I10 Essential (primary) hypertension: Secondary | ICD-10-CM | POA: Diagnosis not present

## 2014-03-06 DIAGNOSIS — S0990XA Unspecified injury of head, initial encounter: Secondary | ICD-10-CM | POA: Diagnosis not present

## 2014-03-06 DIAGNOSIS — F0280 Dementia in other diseases classified elsewhere without behavioral disturbance: Secondary | ICD-10-CM | POA: Diagnosis not present

## 2014-03-06 DIAGNOSIS — G309 Alzheimer's disease, unspecified: Secondary | ICD-10-CM

## 2014-03-06 LAB — HEMOGLOBIN A1C
Hgb A1c MFr Bld: 5.1 % (ref <5.7)
Mean Plasma Glucose: 100 mg/dL (ref <117)

## 2014-03-06 LAB — LIPID PANEL
Cholesterol: 149 mg/dL (ref 0–200)
HDL: 46 mg/dL (ref >39)
LDL Cholesterol: 83 mg/dL (ref 0–99)
Total CHOL/HDL Ratio: 3.2 RATIO
Triglycerides: 101 mg/dL (ref <150)
VLDL: 20 mg/dL (ref 0–40)

## 2014-03-06 MED ORDER — GADOBENATE DIMEGLUMINE 529 MG/ML IV SOLN
15.0000 mL | Freq: Once | INTRAVENOUS | Status: AC | PRN
  Administered 2014-03-06: 15 mL via INTRAVENOUS

## 2014-03-06 MED ORDER — DEXTROSE 5 % IV SOLN
1.0000 g | INTRAVENOUS | Status: DC
  Administered 2014-03-06 – 2014-03-08 (×3): 1 g via INTRAVENOUS
  Filled 2014-03-06 (×3): qty 10

## 2014-03-06 MED ORDER — ATORVASTATIN CALCIUM 20 MG PO TABS
20.0000 mg | ORAL_TABLET | Freq: Every day | ORAL | Status: DC
  Administered 2014-03-06 – 2014-03-08 (×3): 20 mg via ORAL
  Filled 2014-03-06 (×3): qty 1

## 2014-03-06 NOTE — Evaluation (Addendum)
Physical Therapy Evaluation Patient Details Name: Shelia Marks MRN: [id number] DOB: 1926-06-03 Today's Date: 03/06/2014 Time: 4401-0272 PT Time Calculation (min): 27 min  PT Assessment / Plan / Recommendation History of Present Illness  80 y.o. female presenting with slurred speech and left-sided weakness after falling. TPA was not given secondary to delay in arrival. An MRI revealed a 6 mm acute right pontine infarct.  Clinical Impression  Pt admitted with above. Pt currently with functional limitations due to the deficits listed below (see PT Problem List).  Pt will benefit from skilled PT to increase their independence and safety with mobility to allow discharge to the venue listed below. Feel pt will need rehab prior to return home.      PT Assessment  Patient needs continued PT services    Follow Up Recommendations  CIR    Does the patient have the potential to tolerate intense rehabilitation      Barriers to Discharge Decreased caregiver support      Equipment Recommendations  Rolling walker with 5" wheels    Recommendations for Other Services     Frequency Min 4X/week    Precautions / Restrictions Precautions Precautions: Fall   Pertinent Vitals/Pain See flow sheet      Mobility  Bed Mobility Overal bed mobility: Needs Assistance Bed Mobility: Supine to Sit Supine to sit: Min assist General bed mobility comments: Assist to bring trunk up. Transfers Overall transfer level: Needs assistance Equipment used: Rolling walker (2 wheeled);None Transfers: Sit to/from [employer] to Stand: Min assist Stand pivot transfers: Min assist General transfer comment: Assist for balance and for stabilization of lt knee.  Performed bed to bsc to bed pivots without assistive device. Verbal cues to slow down. Ambulation/Gait Ambulation/Gait assistance: Min assist Ambulation Distance (Feet): 5 Feet Assistive device: Rolling walker (2 wheeled) Gait  Pattern/deviations: Step-to pattern;Decreased step length - left;Decreased stance time - left Gait velocity: decr Gait velocity interpretation: Below normal speed for age/gender General Gait Details: Assist to advance and place LLE. Lt knee hyperextending in stance. Verbal cues to stay closer to walker. Modified Rankin (Stroke Patients Only) Pre-Morbid Rankin Score: No symptoms Modified Rankin: Moderately severe disability    Exercises     PT Diagnosis: Difficulty walking;Hemiplegia non-dominant side  PT Problem List: Decreased strength;Decreased balance;Decreased mobility;Decreased cognition;Decreased knowledge of use of DME;Decreased safety awareness;Decreased knowledge of precautions PT Treatment Interventions: DME instruction;Gait training;Functional mobility training;Therapeutic activities;Therapeutic exercise;Neuromuscular re-education;Balance training;Patient/family education     PT Goals(Current goals can be found in the care plan section) Acute Rehab PT Goals Patient Stated Goal: Be independent PT Goal Formulation: With patient Time For Goal Achievement: 03/13/14 Potential to Achieve Goals: Good  Visit Information  Last PT Received On: 03/06/14 Assistance Needed: +1 History of Present Illness: 81 y.o. female presenting with slurred speech and left-sided weakness after falling. TPA was not given secondary to delay in arrival. An MRI revealed a 6 mm acute right pontine infarct.       Prior [place] expects to be discharged to:: Private residence Living Arrangements: Alone Type of Home: House Home Access: Stairs to enter Technical brewer of Steps: 2 Entrance Stairs-Rails: Right Home Layout: One level Home Equipment: None Prior Function Level of Independence: Independent Communication Communication: Expressive difficulties Dominant Hand: Right    Cognition  Cognition Arousal/Alertness: Awake/alert Behavior During Therapy:  Impulsive Overall Cognitive Status: Impaired/Different from baseline Area of Impairment: Safety/judgement Safety/Judgement: Decreased awareness of safety;Decreased awareness of deficits    Extremity/Trunk  Assessment Upper Extremity Assessment Upper Extremity Assessment: Defer to OT evaluation Lower Extremity Assessment Lower Extremity Assessment: LLE deficits/detail LLE Deficits / Details: Strenght 2+/5   Balance Balance Overall balance assessment: Needs assistance Sitting-balance support: No upper extremity supported Sitting balance-Leahy Scale: Good Standing balance support: Bilateral upper extremity supported Standing balance-Leahy Scale: Poor Standing balance comment: Stood with rolling walker with min A.  End of Session PT - End of Session Equipment Utilized During Treatment: Gait belt Activity Tolerance: Patient tolerated treatment well Patient left: in chair;with call bell/phone within reach Nurse Communication: Mobility status  GP     Shelia Marks 03/06/2014, 3:02 PM  [facility] PT [phone]

## 2014-03-06 NOTE — Progress Notes (Signed)
Patient ID: Shelia Marks  female  IRJ:[id number]    DOB: [birth year]    DOA: 03/05/2014  PCP: Hollace Kinnier, DO  Assessment/Plan: Principal Problem:   CVA (cerebral vascular accident): Still has significant left leg weakness, dysarthria is improving  - Currently dysarthria has improved overnight significantly worsened with the facial drooping, left-sided weakness - Undergoing stroke workup, rapid response was called last night for worsening of her symptoms, patient was examined by Dr. Leonel Ramsay, recommended fluid bolus and obtain head CT, may have mild extension of small infarct, likely pontine - CT head showed subcentimeter ischemic insult in the central to slightly left median pons - MRI/MRA brain is still pending - 2-D echo done, EF 60-65%, normal wall motion, grade 1 diastolic dysfunction  - carotid Dopplers pending, PT OT speech eval - Continue aspirin, statin - Lipid panel showed LDL 83, cholesterol 149    Active Problems:   Hypothyroidism -Continue Synthroid      Hypertension BP 143/76 - Permissive hypertension due to acute CVA, continue atenolol and lisinopril     Hyperlipidemia LDL goal < 100 - Continue statin   ? UTI - Follow urine culture, but now started on Rocephin, if urine culture shows no growth, can be discontinued  DVT Prophylaxis: Lovenox   Code Status: full CODE STATUS   Family Communication:  Disposition: currently not medically ready   Consultants:  Neurology   Procedures:  None   Antibiotics:  IV Rocephin 3/7>>    Subjective: Patient seen and examined, dysarthria is improving, still has left leg weakness   Objective: Weight change:   Intake/Output Summary (Last 24 hours) at 03/06/14 1027 Last data filed at 03/06/14 0700  Gross per 24 hour  Intake 1953.75 ml  Output    151 ml  Net 1802.75 ml   Blood pressure 143/76, pulse 80, temperature 98.7 F (37.1 C), temperature source Oral, resp. rate 20, height 5\' 3"  (1.6 m), weight  75.751 kg (167 lb), SpO2 100.00%.  Physical Exam: General: Alert and awake, oriented x3, not in any acute distress, dysarthria improving . slight facial droop CVS: S1-S2 clear, no murmur rubs or gallops Chest: clear to auscultation bilaterally, no wheezing, rales or rhonchi Abdomen: soft nontender, nondistended, normal bowel sounds  Extremities: no cyanosis, clubbing or edema noted bilaterally Neuro:  slight facial droop, mild dysarthria, left lower extremity 3/5, LUE 4/5, right upper and lower extremity 5/5   Lab Results: Basic Metabolic Panel:  Recent Labs Lab 03/05/14 0414 03/05/14 0843  NA 143 143  K 3.6* 3.5*  CL 105 108  CO2 23 23  GLUCOSE 98 87  BUN 24* 20  CREATININE 0.98 0.94  CALCIUM 10.1 9.6   Liver Function Tests:  Recent Labs Lab 03/05/14 0414 03/05/14 0843  AST 19 16  ALT 12 9  ALKPHOS 80 67  BILITOT 0.6 0.6  PROT 7.8 7.1  ALBUMIN 4.0 3.3*   No results found for this basename: LIPASE, AMYLASE,  in the last 168 hours No results found for this basename: AMMONIA,  in the last 168 hours CBC:  Recent Labs Lab 03/05/14 0414 03/05/14 0843  WBC 6.2 3.6*  NEUTROABS 3.3 2.1  HGB 13.0 11.3*  HCT 39.1 34.9*  MCV 91.1 92.6  PLT 184 152   Cardiac Enzymes: No results found for this basename: CKTOTAL, CKMB, CKMBINDEX, TROPONINI,  in the last 168 hours BNP: No components found with this basename: POCBNP,  CBG: No results found for this basename: GLUCAP,  in the last 168  hours   Micro Results: No results found for this or any previous visit (from the past 240 hour(s)).  Studies/Results: Ct Head Wo Contrast  03/06/2014   CLINICAL DATA:  Stroke with weakness on the right side.  EXAM: CT HEAD WITHOUT CONTRAST  TECHNIQUE: Contiguous axial images were obtained from the base of the skull through the vertex without intravenous contrast.  COMPARISON:  Head CT from yesterday  FINDINGS: Skull and Sinuses:No significant abnormality.  Orbits: Bilateral cataract  resection.  Brain: There is low attenuation within the right thalamus, with blurring between the thalamus and the internal capsule. Sub centimeter ischemic insult in the central to slightly left parent median pons. Chronic small vessel ischemic white matter disease with patchy bilateral cerebral white matter low attenuation - stable in pattern. No cortical ischemia detected. No evidence of hemorrhage, hydrocephalus, mass lesion, or shift  These results were called by telephone at the time of interpretation on 03/06/2014 at 1:03 AM to Dr. Roland Rack , who verbally acknowledged these results.  IMPRESSION: 1. 2 subcortical ischemic foci which could be recent, located in the right thalamus and left central pons. 2. No intracranial hemorrhage.   Electronically Signed   By: Jorje Guild M.D.   On: 03/06/2014 01:04   Ct Head Wo Contrast  03/05/2014   CLINICAL DATA:  Fall yesterday.  Slurred speech.  EXAM: CT HEAD WITHOUT CONTRAST  TECHNIQUE: Contiguous axial images were obtained from the base of the skull through the vertex without intravenous contrast.  COMPARISON:  No comparison head CT.  Comparison neck CT 06/14/2014.  FINDINGS: No skull fracture or intracranial hemorrhage.  Small vessel disease type changes. Small remote appearing infarct central pons. No CT evidence of large acute infarct.  No intracranial mass lesion noted on this unenhanced exam.  No hydrocephalus.  Vascular calcifications.  Mastoid air cells, middle ear cavities and visualized paranasal sinuses are clear.  Orbital structures unremarkable.  IMPRESSION: No intracranial hemorrhage or CT evidence of large acute infarct. Please see above.   Electronically Signed   By: Chauncey Cruel M.D.   On: 03/05/2014 05:54    Medications: Scheduled Meds: . allopurinol  300 mg Oral Daily  . amLODipine  10 mg Oral Daily  . aspirin  300 mg Rectal Daily   Or  . aspirin  325 mg Oral Daily  . atenolol  25 mg Oral Daily  . atorvastatin  20 mg Oral  q1800  . enoxaparin (LOVENOX) injection  40 mg Subcutaneous Q24H  . levothyroxine  100 mcg Oral QAC breakfast  . lisinopril  5 mg Oral Daily      LOS: 1 day   Bracen Schum M.D. [facility] 03/06/2014, 10:27 AM Pager: [phone]  If 7PM-7AM, please contact night-coverage [email or web address] Password [email or web address]

## 2014-03-06 NOTE — Progress Notes (Signed)
Stroke Team Progress Note  HISTORY Shelia Marks is an 80 y.o. female with a history of hypertension who presents with a fall around 3 PM 03/05/2015. Since that time her family member has noticed slurred speech, and the patient has had leg weakness. She suspected that it was just due to her vertigo but because she was having persistent symptoms and her family member thought she saw some asymmetry in her face she presented to the ER on the evening of 03/05/2014 where she was seen by Dr. Leonel Marks.  LKW: 3/5 3 PM   tpa given?: no, outside of window  NIHSS: 6  SUBJECTIVE Two daughters in the room. Pt is feeling some better.  OBJECTIVE Most recent Vital Signs: Filed Vitals:   03/06/14 0040 03/06/14 0400 03/06/14 0737 03/06/14 1200  BP: 143/76 169/92 143/76 131/64  Pulse: 89 80 80 61  Temp: 98.5 F (36.9 C) 98.8 F (37.1 C) 98.7 F (37.1 C) 99 F (37.2 C)  TempSrc: Oral Oral Oral Oral  Resp: 18 18 20 16   Height:      Weight:      SpO2: 97% 98% 100% 98%   CBG (last 3)  No results found for this basename: GLUCAP,  in the last 72 hours  IV Fluid Intake:   . sodium chloride 75 mL/hr (03/06/14 0403)    MEDICATIONS  . allopurinol  300 mg Oral Daily  . amLODipine  10 mg Oral Daily  . aspirin  300 mg Rectal Daily   Or  . aspirin  325 mg Oral Daily  . atenolol  25 mg Oral Daily  . atorvastatin  20 mg Oral q1800  . cefTRIAXone (ROCEPHIN)  IV  1 g Intravenous Q24H  . enoxaparin (LOVENOX) injection  40 mg Subcutaneous Q24H  . levothyroxine  100 mcg Oral QAC breakfast  . lisinopril  5 mg Oral Daily   PRN:  calcium carbonate, HYDROcodone-acetaminophen, senna-docusate  Diet:  Dysphagia III thin liquids Activity:  Up with assistance DVT Prophylaxis:  Lovenox  CLINICALLY SIGNIFICANT STUDIES Basic Metabolic Panel:  Recent Labs Lab 03/05/14 0414 03/05/14 0843  NA 143 143  K 3.6* 3.5*  CL 105 108  CO2 23 23  GLUCOSE 98 87  BUN 24* 20  CREATININE 0.98 0.94  CALCIUM 10.1  9.6   Liver Function Tests:  Recent Labs Lab 03/05/14 0414 03/05/14 0843  AST 19 16  ALT 12 9  ALKPHOS 80 67  BILITOT 0.6 0.6  PROT 7.8 7.1  ALBUMIN 4.0 3.3*   CBC:  Recent Labs Lab 03/05/14 0414 03/05/14 0843  WBC 6.2 3.6*  NEUTROABS 3.3 2.1  HGB 13.0 11.3*  HCT 39.1 34.9*  MCV 91.1 92.6  PLT 184 152   Coagulation:  Recent Labs Lab 03/05/14 0414  LABPROT 12.5  INR 0.95   Cardiac Enzymes: No results found for this basename: CKTOTAL, CKMB, CKMBINDEX, TROPONINI,  in the last 168 hours Urinalysis:  Recent Labs Lab 03/05/14 2055  COLORURINE YELLOW  LABSPEC 1.013  PHURINE 7.0  GLUCOSEU NEGATIVE  HGBUR NEGATIVE  BILIRUBINUR NEGATIVE  KETONESUR NEGATIVE  PROTEINUR NEGATIVE  UROBILINOGEN 0.2  NITRITE NEGATIVE  LEUKOCYTESUR MODERATE*   Lipid Panel    Component Value Date/Time   CHOL 149 03/06/2014 0457   TRIG 101 03/06/2014 0457   HDL 46 03/06/2014 0457   CHOLHDL 3.2 03/06/2014 0457   VLDL 20 03/06/2014 0457   LDLCALC 83 03/06/2014 0457   HgbA1C  No results found for this basename: HGBA1C  Urine Drug Screen:   No results found for this basename: labopia, cocainscrnur, labbenz, amphetmu, thcu, labbarb    Alcohol Level: No results found for this basename: ETH,  in the last 168 hours  Ct Head Wo Contrast 03/06/2014    1. 2 subcortical ischemic foci which could be recent, located in the right thalamus and left central pons. 2. No intracranial hemorrhage.   Ct Head Wo Contrast 03/05/2014    No intracranial hemorrhage or CT evidence of large acute infarct.    Mr Angiogram Neck W Wo Contrast 03/06/2014    1. 6 mm acute right pontine infarct.  2. Mild chronic small vessel ischemic disease and small, remote left pontine infarct.  3. No evidence of major intracranial arterial occlusion or flow limiting stenosis.  4. Unremarkable appearance of the cervical carotid and vertebral arteries.        2D Echocardiogram  Ejection fraction is 60 - 65%. No cardiac source of  emboli identified.  Carotid Doppler  See MRA  CXR    EKG results pending For complete results please see formal report.   Therapy Recommendations pending  Physical Exam    Mental Status:  Patient is awake, alert, oriented to person, place, month, year.  Patient is able to give a clear and coherent history.  No signs of aphasia or neglect  She is dysarthric.  Cranial Nerves:  II: Visual Fields are full. Pupils are equal, round, and reactive to light. Discs are difficult to visualize.  III,IV, VI: EOMI without ptosis or diploplia.  V: Facial sensation is symmetric to temperature  VII: Facial movement is possible mild left facial weakness  VIII: hearing is intact to voice  X: Uvula elevates symmetrically  XI: Shoulder shrug is symmetric.  XII: tongue is midline without atrophy or fasciculations.  Motor:  Tone is normal. Bulk is normal. 5/5 strength was present on the right. On the left she has mild 4/5 weakness in the arm and 1-2 /5 weakness in the left leg.  Sensory:  Sensation is symmetric to light touch and temperature in the arms and legs.  Deep Tendon Reflexes:  1+ and symmetric in the biceps and patellae.  Cerebellar:  FNF intact on right, ataxic on left  Gait: deferred   ASSESSMENT Ms. Shelia Marks is a 80 y.o. female presenting with slurred speech and left-sided weakness after falling. TPA was not given secondary to delay in arrival. An MRI revealed a 6 mm acute right pontine infarct.  Infarct felt to be thrombotic secondary to small vessel disease. On no antithrombotic prior to admission. Now on aspirin 325 mg orally every day for secondary stroke prevention. Patient with resultant left hemiparesis. Stroke work up underway.   Hyperlipidemia - cholesterol 149; LDL 83 - on Zocor prior to admission.  Hypertension history  Alzheimer's dementia   Hospital day # 1  TREATMENT/PLAN  Continue aspirin 325 mg orally every day for secondary stroke  prevention.  Await therapy evaluations  Await hemoglobin A1c.   Shelia Bussing PA-C [facility] Pager [phone] 03/06/2014, 12:46 PM  I have personally obtained a history, examined the patient, evaluated imaging results, and formulated the assessment and plan of care. I agree with the above.  Shelia Marks Please call with questions.

## 2014-03-07 DIAGNOSIS — I6350 Cerebral infarction due to unspecified occlusion or stenosis of unspecified cerebral artery: Principal | ICD-10-CM

## 2014-03-07 DIAGNOSIS — I10 Essential (primary) hypertension: Secondary | ICD-10-CM

## 2014-03-07 LAB — URINE CULTURE: Colony Count: 55000

## 2014-03-07 LAB — GLUCOSE, CAPILLARY: Glucose-Capillary: 69 mg/dL — ABNORMAL LOW (ref 70–99)

## 2014-03-07 NOTE — Progress Notes (Signed)
Patient ID: Shelia Marks  female  NKN:[id number]    DOB: 1926/03/19    DOA: 03/05/2014  PCP: Hollace Kinnier, DO  Assessment/Plan: Principal Problem:   CVA (cerebral vascular accident): - Still has significant left leg and mod left arm weakness, dysarthria has improved -  rapid response was called for worsening of her symptoms, patient was examined by Dr. Leonel Ramsay, recommended fluid bolus and obtain head CT, may have mild extension of small infarct, likely pontine - CT head showed subcentimeter ischemic insult in the central to slightly left median pons - MRI/MRA brain confirms right pontine infarct - 2-D echo done, EF 60-65%, normal wall motion, grade 1 diastolic dysfunction  - carotid Dopplers -negative -  PT recommends CIR- will consult - Continue aspirin- 325 mg, statin - Lipid panel showed LDL 83, cholesterol 149 - HBA1c normal    Active Problems:   Hypothyroidism -Continue Synthroid      Hypertension BP 143/76 - Permissive hypertension due to acute CVA, continue atenolol and lisinopril     Hyperlipidemia LDL goal < 100 - Continue statin   ? UTI - Follow urine culture, but now started on Rocephin, if urine culture shows no growth, can be discontinued  DVT Prophylaxis: Lovenox   Code Status: full CODE STATUS   Family Communication:  Disposition: CIR consult  Consultants:  Neurology   Procedures:  None   Antibiotics:  IV Rocephin 3/7>>   Subjective: Patient alert- c/o pain in left neck only when she turns her head to the right.   Objective: Weight change:   Intake/Output Summary (Last 24 hours) at 03/07/14 1108 Last data filed at 03/07/14 0536  Gross per 24 hour  Intake   1815 ml  Output    200 ml  Net   1615 ml   Blood pressure 120/64, pulse 79, temperature 98.5 F (36.9 C), temperature source Oral, resp. rate 18, height 5\' 3"  (1.6 m), weight 75.751 kg (167 lb), SpO2 98.00%.  Physical Exam: General: Alert and awake, oriented x3, not in  any acute distress, dysarthria improving . slight facial droop CVS: S1-S2 clear, no murmur rubs or gallops Chest: clear to auscultation bilaterally, no wheezing, rales or rhonchi Abdomen: soft nontender, nondistended, normal bowel sounds  Extremities: no cyanosis, clubbing or edema noted bilaterally Neuro:  slight facial droop, mild dysarthria, left lower extremity 3/5, LUE 4/5, right upper and lower extremity 5/5   Lab Results: Basic Metabolic Panel:  Recent Labs Lab 03/05/14 0414 03/05/14 0843  NA 143 143  K 3.6* 3.5*  CL 105 108  CO2 23 23  GLUCOSE 98 87  BUN 24* 20  CREATININE 0.98 0.94  CALCIUM 10.1 9.6   Liver Function Tests:  Recent Labs Lab 03/05/14 0414 03/05/14 0843  AST 19 16  ALT 12 9  ALKPHOS 80 67  BILITOT 0.6 0.6  PROT 7.8 7.1  ALBUMIN 4.0 3.3*   No results found for this basename: LIPASE, AMYLASE,  in the last 168 hours No results found for this basename: AMMONIA,  in the last 168 hours CBC:  Recent Labs Lab 03/05/14 0414 03/05/14 0843  WBC 6.2 3.6*  NEUTROABS 3.3 2.1  HGB 13.0 11.3*  HCT 39.1 34.9*  MCV 91.1 92.6  PLT 184 152   Cardiac Enzymes: No results found for this basename: CKTOTAL, CKMB, CKMBINDEX, TROPONINI,  in the last 168 hours BNP: No components found with this basename: POCBNP,  CBG:  Recent Labs Lab 03/07/14 0757  GLUCAP 69*  Micro Results: No results found for this or any previous visit (from the past 240 hour(s)).  Studies/Results: Ct Head Wo Contrast  03/06/2014   CLINICAL DATA:  Stroke with weakness on the right side.  EXAM: CT HEAD WITHOUT CONTRAST  TECHNIQUE: Contiguous axial images were obtained from the base of the skull through the vertex without intravenous contrast.  COMPARISON:  Head CT from yesterday  FINDINGS: Skull and Sinuses:No significant abnormality.  Orbits: Bilateral cataract resection.  Brain: There is low attenuation within the right thalamus, with blurring between the thalamus and the  internal capsule. Sub centimeter ischemic insult in the central to slightly left parent median pons. Chronic small vessel ischemic white matter disease with patchy bilateral cerebral white matter low attenuation - stable in pattern. No cortical ischemia detected. No evidence of hemorrhage, hydrocephalus, mass lesion, or shift  These results were called by telephone at the time of interpretation on 03/06/2014 at 1:03 AM to Dr. Roland Rack , who verbally acknowledged these results.  IMPRESSION: 1. 2 subcortical ischemic foci which could be recent, located in the right thalamus and left central pons. 2. No intracranial hemorrhage.   Electronically Signed   By: Jorje Guild M.D.   On: 03/06/2014 01:04   Ct Head Wo Contrast  03/05/2014   CLINICAL DATA:  Fall yesterday.  Slurred speech.  EXAM: CT HEAD WITHOUT CONTRAST  TECHNIQUE: Contiguous axial images were obtained from the base of the skull through the vertex without intravenous contrast.  COMPARISON:  No comparison head CT.  Comparison neck CT 06/14/2014.  FINDINGS: No skull fracture or intracranial hemorrhage.  Small vessel disease type changes. Small remote appearing infarct central pons. No CT evidence of large acute infarct.  No intracranial mass lesion noted on this unenhanced exam.  No hydrocephalus.  Vascular calcifications.  Mastoid air cells, middle ear cavities and visualized paranasal sinuses are clear.  Orbital structures unremarkable.  IMPRESSION: No intracranial hemorrhage or CT evidence of large acute infarct. Please see above.   Electronically Signed   By: Chauncey Cruel M.D.   On: 03/05/2014 05:54    Medications: Scheduled Meds: . allopurinol  300 mg Oral Daily  . amLODipine  10 mg Oral Daily  . aspirin  300 mg Rectal Daily   Or  . aspirin  325 mg Oral Daily  . atenolol  25 mg Oral Daily  . atorvastatin  20 mg Oral q1800  . cefTRIAXone (ROCEPHIN)  IV  1 g Intravenous Q24H  . enoxaparin (LOVENOX) injection  40 mg Subcutaneous Q24H   . levothyroxine  100 mcg Oral QAC breakfast  . lisinopril  5 mg Oral Daily      LOS: 2 days   [facility] M.D. [facility] 03/07/2014, 11:08 AM Pager: [phone]  If 7PM-7AM, please contact night-coverage [email or web address] Password [email or web address]

## 2014-03-07 NOTE — Progress Notes (Signed)
VASCULAR LAB PRELIMINARY  PRELIMINARY  PRELIMINARY  PRELIMINARY  Carotid duplex  completed.    Preliminary report:  Bilateral:  1-39% ICA stenosis.  Vertebral artery flow is antegrade.      Shelia Marks, RVT 03/07/2014, 12:13 PM

## 2014-03-08 ENCOUNTER — Encounter (HOSPITAL_COMMUNITY): Payer: Self-pay | Admitting: General Practice

## 2014-03-08 ENCOUNTER — Inpatient Hospital Stay (HOSPITAL_COMMUNITY)
Admission: RE | Admit: 2014-03-08 | Discharge: 2014-03-19 | DRG: 945 | Disposition: A | Discharge status: Home Health | Payer: Medicare Other | Source: Intra-hospital | Attending: Physical Medicine & Rehabilitation | Admitting: Physical Medicine & Rehabilitation

## 2014-03-08 DIAGNOSIS — F0280 Dementia in other diseases classified elsewhere without behavioral disturbance: Secondary | ICD-10-CM

## 2014-03-08 DIAGNOSIS — Z79899 Other long term (current) drug therapy: Secondary | ICD-10-CM | POA: Diagnosis not present

## 2014-03-08 DIAGNOSIS — E890 Postprocedural hypothyroidism: Secondary | ICD-10-CM

## 2014-03-08 DIAGNOSIS — E785 Hyperlipidemia, unspecified: Secondary | ICD-10-CM | POA: Diagnosis present

## 2014-03-08 DIAGNOSIS — I6330 Cerebral infarction due to thrombosis of unspecified cerebral artery: Secondary | ICD-10-CM | POA: Diagnosis present

## 2014-03-08 DIAGNOSIS — R1310 Dysphagia, unspecified: Secondary | ICD-10-CM | POA: Diagnosis present

## 2014-03-08 DIAGNOSIS — Z5189 Encounter for other specified aftercare: Secondary | ICD-10-CM | POA: Diagnosis not present

## 2014-03-08 DIAGNOSIS — G309 Alzheimer's disease, unspecified: Secondary | ICD-10-CM

## 2014-03-08 DIAGNOSIS — K5900 Constipation, unspecified: Secondary | ICD-10-CM | POA: Diagnosis present

## 2014-03-08 DIAGNOSIS — M1990 Unspecified osteoarthritis, unspecified site: Secondary | ICD-10-CM | POA: Diagnosis present

## 2014-03-08 DIAGNOSIS — E039 Hypothyroidism, unspecified: Secondary | ICD-10-CM

## 2014-03-08 DIAGNOSIS — I639 Cerebral infarction, unspecified: Secondary | ICD-10-CM

## 2014-03-08 DIAGNOSIS — Z8673 Personal history of transient ischemic attack (TIA), and cerebral infarction without residual deficits: Secondary | ICD-10-CM | POA: Diagnosis not present

## 2014-03-08 DIAGNOSIS — I69991 Dysphagia following unspecified cerebrovascular disease: Secondary | ICD-10-CM

## 2014-03-08 DIAGNOSIS — I6350 Cerebral infarction due to unspecified occlusion or stenosis of unspecified cerebral artery: Secondary | ICD-10-CM | POA: Diagnosis not present

## 2014-03-08 DIAGNOSIS — I10 Essential (primary) hypertension: Secondary | ICD-10-CM | POA: Diagnosis not present

## 2014-03-08 DIAGNOSIS — I69920 Aphasia following unspecified cerebrovascular disease: Secondary | ICD-10-CM | POA: Diagnosis not present

## 2014-03-08 LAB — CBC
HCT: 34.9 % — ABNORMAL LOW (ref 36.0–46.0)
Hemoglobin: 11.4 g/dL — ABNORMAL LOW (ref 12.0–15.0)
MCH: 29.7 pg (ref 26.0–34.0)
MCHC: 32.7 g/dL (ref 30.0–36.0)
MCV: 90.9 fL (ref 78.0–100.0)
Platelets: 164 10*3/uL (ref 150–400)
RBC: 3.84 MIL/uL — ABNORMAL LOW (ref 3.87–5.11)
RDW: 15 % (ref 11.5–15.5)
WBC: 5.3 10*3/uL (ref 4.0–10.5)

## 2014-03-08 LAB — CREATININE, SERUM
Creatinine, Ser: 0.98 mg/dL (ref 0.50–1.10)
GFR calc Af Amer: 59 mL/min — ABNORMAL LOW (ref >90)
GFR calc non Af Amer: 51 mL/min — ABNORMAL LOW (ref >90)

## 2014-03-08 MED ORDER — ONDANSETRON HCL 4 MG/2ML IJ SOLN: 4.0000 mg | Freq: Four times a day (QID) | INTRAMUSCULAR | Status: DC | PRN

## 2014-03-08 MED ORDER — ACETAMINOPHEN 325 MG PO TABS
325.0000 mg | ORAL_TABLET | ORAL | Status: DC | PRN
  Filled 2014-03-08: qty 2

## 2014-03-08 MED ORDER — ALLOPURINOL 300 MG PO TABS
300.0000 mg | ORAL_TABLET | Freq: Every day | ORAL | Status: DC
  Administered 2014-03-09 – 2014-03-19 (×11): 300 mg via ORAL
  Filled 2014-03-08 (×12): qty 1

## 2014-03-08 MED ORDER — ATORVASTATIN CALCIUM 20 MG PO TABS
20.0000 mg | ORAL_TABLET | Freq: Every day | ORAL | Status: DC
  Administered 2014-03-09 – 2014-03-18 (×10): 20 mg via ORAL
  Filled 2014-03-08 (×11): qty 1

## 2014-03-08 MED ORDER — SENNOSIDES-DOCUSATE SODIUM 8.6-50 MG PO TABS: 1.0000 | ORAL_TABLET | Freq: Every evening | ORAL | Status: DC | PRN

## 2014-03-08 MED ORDER — HYDROCODONE-ACETAMINOPHEN 5-325 MG PO TABS
1.0000 | ORAL_TABLET | Freq: Four times a day (QID) | ORAL | Status: DC | PRN
  Administered 2014-03-08: 1 via ORAL
  Filled 2014-03-08: qty 1

## 2014-03-08 MED ORDER — ASPIRIN 325 MG PO TABS
325.0000 mg | ORAL_TABLET | Freq: Every day | ORAL | Status: DC
  Administered 2014-03-09 – 2014-03-19 (×11): 325 mg via ORAL
  Filled 2014-03-08 (×13): qty 1

## 2014-03-08 MED ORDER — AMLODIPINE BESYLATE 10 MG PO TABS
10.0000 mg | ORAL_TABLET | Freq: Every day | ORAL | Status: DC
  Administered 2014-03-09 – 2014-03-19 (×11): 10 mg via ORAL
  Filled 2014-03-08 (×13): qty 1

## 2014-03-08 MED ORDER — LEVOTHYROXINE SODIUM 100 MCG PO TABS
100.0000 ug | ORAL_TABLET | Freq: Every day | ORAL | Status: DC
  Administered 2014-03-09 – 2014-03-19 (×11): 100 ug via ORAL
  Filled 2014-03-08 (×13): qty 1

## 2014-03-08 MED ORDER — LISINOPRIL 5 MG PO TABS
5.0000 mg | ORAL_TABLET | Freq: Every day | ORAL | Status: DC
  Administered 2014-03-09 – 2014-03-15 (×7): 5 mg via ORAL
  Filled 2014-03-08 (×10): qty 1

## 2014-03-08 MED ORDER — CALCIUM CARBONATE ANTACID 500 MG PO CHEW
1.0000 | CHEWABLE_TABLET | ORAL | Status: DC | PRN
  Filled 2014-03-08: qty 1

## 2014-03-08 MED ORDER — SORBITOL 70 % SOLN: 30.0000 mL | Freq: Every day | Status: DC | PRN

## 2014-03-08 MED ORDER — ATENOLOL 25 MG PO TABS
25.0000 mg | ORAL_TABLET | Freq: Every day | ORAL | Status: DC
  Administered 2014-03-09 – 2014-03-19 (×11): 25 mg via ORAL
  Filled 2014-03-08 (×13): qty 1

## 2014-03-08 MED ORDER — ASPIRIN 300 MG RE SUPP
300.0000 mg | Freq: Every day | RECTAL | Status: DC
  Filled 2014-03-08 (×12): qty 1

## 2014-03-08 MED ORDER — ENOXAPARIN SODIUM 40 MG/0.4ML ~~LOC~~ SOLN: 40.0000 mg | SUBCUTANEOUS | Status: DC

## 2014-03-08 MED ORDER — ENOXAPARIN SODIUM 40 MG/0.4ML ~~LOC~~ SOLN
40.0000 mg | SUBCUTANEOUS | Status: DC
  Administered 2014-03-09 – 2014-03-18 (×10): 40 mg via SUBCUTANEOUS
  Filled 2014-03-08 (×12): qty 0.4

## 2014-03-08 MED ORDER — ONDANSETRON HCL 4 MG PO TABS: 4.0000 mg | ORAL_TABLET | Freq: Four times a day (QID) | ORAL | Status: DC | PRN

## 2014-03-08 NOTE — Consult Note (Signed)
Physical Medicine and Rehabilitation Consult Reason for Consult: CVA Referring Physician: [facility]   HPI: Shelia Marks is a 80 y.o. right-handed female with history of hypertension, hyperlipidemia. Admitted 03/05/2014 with slurred speech and left-sided weakness. Patient lives alone and used a cane prior to admission. MRI of the brain showed a 6 mm acute right pontine infarct. MRA of the head and neck with no major occlusion or stenosis. Carotid Dopplers with no ICA stenosis. Echocardiogram with ejection fraction of 123456 grade 1 diastolic dysfunction. Patient did not receive TPA. Neurology services consulted placed on aspirin for CVA prophylaxis as well as subcutaneous Lovenox for DVT prophylaxis. Tolerating a mechanical soft diet with thin liquids. Maintained on intravenous Rocephin presently with urinalysis study showing 55,000 multiple bacterial. Physical therapy evaluation completed 03/06/2014 with recommendations for physical medicine rehabilitation consult.   Review of Systems  Gastrointestinal: Positive for constipation.  Musculoskeletal: Positive for joint pain and myalgias.  Neurological: Positive for dizziness.       Leg cramps  All other systems reviewed and are negative.   Past Medical History  Diagnosis Date  . Hypothyroidism   . Cancer   . Hypertension   . Sleep related leg cramps   . Senile osteoporosis   . Malignant neoplasm of thyroid gland   . Nontoxic uninodular goiter   . Dizziness and giddiness   . Other symptoms involving cardiovascular system   . Alzheimer's disease   . Varicose veins of lower extremities with inflammation   . Disorder of bone and cartilage, unspecified   . Hyperlipidemia LDL goal < 100   . Unspecified disorder of lipoid metabolism   . Unspecified hereditary and idiopathic peripheral neuropathy   . Phlebitis and thrombophlebitis of unspecified site   . Other malaise and fatigue    Past Surgical History  Procedure Laterality Date    . Abdominal hysterectomy      DR HUGES  . Knee surgery      right  . Colon surgery    . Thyroid surgery  July 20, 2011  . Parathyroid adenoma removed  1983  . Catract surgery  2008   Family History  Problem Relation Age of Onset  . Diabetes Brother    Social History:  reports that she has never smoked. She has never used smokeless tobacco. She reports that she does not drink alcohol or use illicit drugs. Allergies: No Known Allergies Medications Prior to Admission  Medication Sig Dispense Refill  . allopurinol (ZYLOPRIM) 300 MG tablet Take 1 tablet (300 mg total) by mouth daily.  90 tablet  3  . amLODipine (NORVASC) 10 MG tablet Take 1 tablet (10 mg total) by mouth daily.  90 tablet  3  . atenolol (TENORMIN) 25 MG tablet Take 1 tablet (25 mg total) by mouth daily.  90 tablet  3  . calcium carbonate (TUMS - DOSED IN MG ELEMENTAL CALCIUM) 500 MG chewable tablet Chew 1 tablet by mouth as needed for indigestion or heartburn.      . levothyroxine (SYNTHROID, LEVOTHROID) 100 MCG tablet Take 1 tablet (100 mcg total) by mouth daily. On an empty stomach separate prior to other medications  90 tablet  3  . lisinopril (PRINIVIL,ZESTRIL) 5 MG tablet Take 1 tablet (5 mg total) by mouth daily.  90 tablet  3  . simvastatin (ZOCOR) 40 MG tablet Take 1 tablet (40 mg total) by mouth at bedtime.  90 tablet  3    Home: Home Living Family/patient expects to  be discharged to:: Private residence Living Arrangements: Alone Type of Home: House Home Access: Stairs to enter Technical brewer of Steps: 2 Entrance Stairs-Rails: Right Home Layout: One level Home Equipment: None  Functional History:   Functional Status:  Mobility:     Ambulation/Gait Ambulation Distance (Feet): 5 Feet Gait velocity: decr General Gait Details: Assist to advance and place LLE. Lt knee hyperextending in stance. Verbal cues to stay closer to walker.    ADL:    Cognition: Cognition Overall Cognitive Status:  Impaired/Different from baseline Orientation Level: Oriented X4 Cognition Arousal/Alertness: Awake/alert Behavior During Therapy: Impulsive Overall Cognitive Status: Impaired/Different from baseline Area of Impairment: Safety/judgement Safety/Judgement: Decreased awareness of safety;Decreased awareness of deficits  Blood pressure 153/80, pulse 80, temperature 98.7 F (37.1 C), temperature source Oral, resp. rate 18, height 5\' 3"  (1.6 m), weight 75.751 kg (167 lb), SpO2 96.00%. Physical Exam  Constitutional: She is oriented to person, place, and time.  HENT:  Head: Normocephalic.  Eyes: EOM are normal.  Neck: Normal range of motion. Neck supple. No thyromegaly present.  Cardiovascular: Normal rate and regular rhythm.   Respiratory: Effort normal and breath sounds normal. No respiratory distress.  GI: Soft. Bowel sounds are normal. She exhibits no distension.  Neurological: She is alert and oriented to person, place, and time.  Speech is mildly slurred but fully intelligible. Left central 7 and mild tongue deviation. LUE is 2/5 deltoid, bicep, and tricep, HI and wrist 2+, LLE is 2 HF, KE and 2- at ankle. Sensory exam grossly intact. Good sitting balance. DTR's 1+ on left and right.   Skin: Skin is warm and dry.  Psychiatric: She has a normal mood and affect. Her behavior is normal. Judgment and thought content normal.    Results for orders placed during the hospital encounter of 03/05/14 (from the past 24 hour(s))  GLUCOSE, CAPILLARY     Status: Abnormal   Collection Time    03/07/14  7:57 AM      Result Value Ref Range   Glucose-Capillary 69 (*) 70 - 99 mg/dL   Mr Angiogram Neck W Wo Contrast  03/06/2014   CLINICAL DATA:  Left-sided weakness and dysarthria.  Fall yesterday.  EXAM: MRI HEAD WITHOUT AND WITH CONTRAST  MRA HEAD WITHOUT CONTRAST  MRA NECK WITHOUT AND WITH CONTRAST  TECHNIQUE: Multiplanar, multiecho pulse sequences of the brain and surrounding structures were obtained  without and with intravenous contrast. Angiographic images of the Circle of Willis were obtained using MRA technique without intravenous contrast. Angiographic images of the neck were obtained using MRA technique without and with intravenous contrast. Carotid stenosis measurements (when applicable) are obtained utilizing NASCET criteria, using the distal internal carotid diameter as the denominator.  CONTRAST:  3mL MULTIHANCE GADOBENATE DIMEGLUMINE 529 MG/ML IV SOLN  COMPARISON:  Head CT 03/06/2014  FINDINGS: MRI HEAD FINDINGS  There is a 6 mm acute infarct in the right pons. There is no evidence of intracranial hemorrhage. There is a 5 mm remote lacunar infarct in the pons slightly left of midline. Small, scattered foci of T2 hyperintensity within the subcortical and deep cerebral white matter bilaterally are nonspecific but compatible with mild chronic small vessel ischemic disease. There is mild generalized cerebral atrophy. There is no evidence of mass, midline shift, or extra-axial fluid collection. There is no abnormal enhancement.  Prior bilateral cataract surgery is noted. Minimal bilateral ethmoid and maxillary sinus mucosal thickening is noted. Mastoid air cells are clear. Mild to moderate multilevel spondylosis is partially visualized  in the cervical spine.  MRA HEAD FINDINGS  The visualized distal vertebral arteries are patent with the right being dominant. Apparent mild narrowing of the midportion of the left V4 segment is likely artifactual given normal appearance on contrasted neck MRA images. PICA origins are patent bilaterally. Right AICA origin is patent. SCA origins are patent. Basilar artery is patent without stenosis. There is minimal irregularity of the left greater than right proximal PCAs without evidence of significant stenosis. There is a small, patent left posterior communicating artery.  Internal carotid arteries are patent from skullbase to carotid termini. There is very mild narrowing  of the supraclinoid left ICA. The left A1 segment is not visualized, likely congenitally absent. Left A2 is supplied by the anterior communicating artery. Right ACA is unremarkable. Bilateral MCAs are unremarkable. No intracranial aneurysm is identified.  MRA NECK FINDINGS  Three vessel aortic arch. The visualized subclavian arteries are unremarkable. Common carotid arteries are patent without stenosis. Cervical internal carotid arteries are unremarkable. There is antegrade flow in the vertebral arteries. Right vertebral artery is dominant. No vertebral artery stenosis is seen.  IMPRESSION: 1. 6 mm acute right pontine infarct. 2. Mild chronic small vessel ischemic disease and small, remote left pontine infarct. 3. No evidence of major intracranial arterial occlusion or flow limiting stenosis. 4. Unremarkable appearance of the cervical carotid and vertebral arteries.   Electronically Signed   By: Logan Bores   On: 03/06/2014 10:59   Mr Brain W Wo Contrast  03/06/2014   CLINICAL DATA:  Left-sided weakness and dysarthria.  Fall yesterday.  EXAM: MRI HEAD WITHOUT AND WITH CONTRAST  MRA HEAD WITHOUT CONTRAST  MRA NECK WITHOUT AND WITH CONTRAST  TECHNIQUE: Multiplanar, multiecho pulse sequences of the brain and surrounding structures were obtained without and with intravenous contrast. Angiographic images of the Circle of Willis were obtained using MRA technique without intravenous contrast. Angiographic images of the neck were obtained using MRA technique without and with intravenous contrast. Carotid stenosis measurements (when applicable) are obtained utilizing NASCET criteria, using the distal internal carotid diameter as the denominator.  CONTRAST:  9mL MULTIHANCE GADOBENATE DIMEGLUMINE 529 MG/ML IV SOLN  COMPARISON:  Head CT 03/06/2014  FINDINGS: MRI HEAD FINDINGS  There is a 6 mm acute infarct in the right pons. There is no evidence of intracranial hemorrhage. There is a 5 mm remote lacunar infarct in the pons  slightly left of midline. Small, scattered foci of T2 hyperintensity within the subcortical and deep cerebral white matter bilaterally are nonspecific but compatible with mild chronic small vessel ischemic disease. There is mild generalized cerebral atrophy. There is no evidence of mass, midline shift, or extra-axial fluid collection. There is no abnormal enhancement.  Prior bilateral cataract surgery is noted. Minimal bilateral ethmoid and maxillary sinus mucosal thickening is noted. Mastoid air cells are clear. Mild to moderate multilevel spondylosis is partially visualized in the cervical spine.  MRA HEAD FINDINGS  The visualized distal vertebral arteries are patent with the right being dominant. Apparent mild narrowing of the midportion of the left V4 segment is likely artifactual given normal appearance on contrasted neck MRA images. PICA origins are patent bilaterally. Right AICA origin is patent. SCA origins are patent. Basilar artery is patent without stenosis. There is minimal irregularity of the left greater than right proximal PCAs without evidence of significant stenosis. There is a small, patent left posterior communicating artery.  Internal carotid arteries are patent from skullbase to carotid termini. There is very mild narrowing of  the supraclinoid left ICA. The left A1 segment is not visualized, likely congenitally absent. Left A2 is supplied by the anterior communicating artery. Right ACA is unremarkable. Bilateral MCAs are unremarkable. No intracranial aneurysm is identified.  MRA NECK FINDINGS  Three vessel aortic arch. The visualized subclavian arteries are unremarkable. Common carotid arteries are patent without stenosis. Cervical internal carotid arteries are unremarkable. There is antegrade flow in the vertebral arteries. Right vertebral artery is dominant. No vertebral artery stenosis is seen.  IMPRESSION: 1. 6 mm acute right pontine infarct. 2. Mild chronic small vessel ischemic disease and  small, remote left pontine infarct. 3. No evidence of major intracranial arterial occlusion or flow limiting stenosis. 4. Unremarkable appearance of the cervical carotid and vertebral arteries.   Electronically Signed   By: Logan Bores   On: 03/06/2014 10:59   Mr Jodene Nam Head/brain Wo Cm  03/06/2014   CLINICAL DATA:  Left-sided weakness and dysarthria.  Fall yesterday.  EXAM: MRI HEAD WITHOUT AND WITH CONTRAST  MRA HEAD WITHOUT CONTRAST  MRA NECK WITHOUT AND WITH CONTRAST  TECHNIQUE: Multiplanar, multiecho pulse sequences of the brain and surrounding structures were obtained without and with intravenous contrast. Angiographic images of the Circle of Willis were obtained using MRA technique without intravenous contrast. Angiographic images of the neck were obtained using MRA technique without and with intravenous contrast. Carotid stenosis measurements (when applicable) are obtained utilizing NASCET criteria, using the distal internal carotid diameter as the denominator.  CONTRAST:  45mL MULTIHANCE GADOBENATE DIMEGLUMINE 529 MG/ML IV SOLN  COMPARISON:  Head CT 03/06/2014  FINDINGS: MRI HEAD FINDINGS  There is a 6 mm acute infarct in the right pons. There is no evidence of intracranial hemorrhage. There is a 5 mm remote lacunar infarct in the pons slightly left of midline. Small, scattered foci of T2 hyperintensity within the subcortical and deep cerebral white matter bilaterally are nonspecific but compatible with mild chronic small vessel ischemic disease. There is mild generalized cerebral atrophy. There is no evidence of mass, midline shift, or extra-axial fluid collection. There is no abnormal enhancement.  Prior bilateral cataract surgery is noted. Minimal bilateral ethmoid and maxillary sinus mucosal thickening is noted. Mastoid air cells are clear. Mild to moderate multilevel spondylosis is partially visualized in the cervical spine.  MRA HEAD FINDINGS  The visualized distal vertebral arteries are patent with  the right being dominant. Apparent mild narrowing of the midportion of the left V4 segment is likely artifactual given normal appearance on contrasted neck MRA images. PICA origins are patent bilaterally. Right AICA origin is patent. SCA origins are patent. Basilar artery is patent without stenosis. There is minimal irregularity of the left greater than right proximal PCAs without evidence of significant stenosis. There is a small, patent left posterior communicating artery.  Internal carotid arteries are patent from skullbase to carotid termini. There is very mild narrowing of the supraclinoid left ICA. The left A1 segment is not visualized, likely congenitally absent. Left A2 is supplied by the anterior communicating artery. Right ACA is unremarkable. Bilateral MCAs are unremarkable. No intracranial aneurysm is identified.  MRA NECK FINDINGS  Three vessel aortic arch. The visualized subclavian arteries are unremarkable. Common carotid arteries are patent without stenosis. Cervical internal carotid arteries are unremarkable. There is antegrade flow in the vertebral arteries. Right vertebral artery is dominant. No vertebral artery stenosis is seen.  IMPRESSION: 1. 6 mm acute right pontine infarct. 2. Mild chronic small vessel ischemic disease and small, remote left pontine infarct. 3.  No evidence of major intracranial arterial occlusion or flow limiting stenosis. 4. Unremarkable appearance of the cervical carotid and vertebral arteries.   Electronically Signed   By: Logan Bores   On: 03/06/2014 10:59    Assessment/Plan: Diagnosis: right pontine infarct 1. Does the need for close, 24 hr/day medical supervision in concert with the patient's rehab needs make it unreasonable for this patient to be served in a less intensive setting? Yes 2. Co-Morbidities requiring supervision/potential complications: htn, dementia 3. Due to bladder management, bowel management, safety, skin/wound care, disease management,  medication administration, pain management and patient education, does the patient require 24 hr/day rehab nursing? Yes 4. Does the patient require coordinated care of a physician, rehab nurse, PT (1-2 hrs/day, 5 days/week), OT (1-2 hrs/day, 5 days/week) and SLP (1-2 hrs/day, 5 days/week) to address physical and functional deficits in the context of the above medical diagnosis(es)? Yes Addressing deficits in the following areas: balance, endurance, locomotion, strength, transferring, bowel/bladder control, bathing, dressing, feeding, grooming, toileting, cognition, speech and psychosocial support 5. Can the patient actively participate in an intensive therapy program of at least 3 hrs of therapy per day at least 5 days per week? Yes 6. The potential for patient to make measurable gains while on inpatient rehab is excellent 7. Anticipated functional outcomes upon discharge from inpatient rehab are supervision  with PT, supervision to min assist with OT, supervision to mod I with SLP. 8. Estimated rehab length of stay to reach the above functional goals is: 10-14 days 9. Does the patient have adequate social supports to accommodate these discharge functional goals? Yes 10. Anticipated D/C setting: Home 11. Anticipated post D/C treatments: [facility] therapy 12. Overall Rehab/Functional Prognosis: excellent  RECOMMENDATIONS: This patient's condition is appropriate for continued rehabilitative care in the following setting: CIR Patient has agreed to participate in recommended program. Potentially Note that insurance prior authorization may be required for reimbursement for recommended care.  Comment: Rehab Admissions Coordinator to follow up.  Thanks,  Meredith Staggers, MD, Mellody Drown     03/08/2014

## 2014-03-08 NOTE — Interval H&P Note (Signed)
Shelia Marks was admitted today to Inpatient Rehabilitation with the diagnosis of thrombotic right pontine infarct.  The patient's history has been reviewed, patient examined, and there is no change in status.  Patient continues to be appropriate for intensive inpatient rehabilitation.  I have reviewed the patient's chart and labs.  Questions were answered to the patient's satisfaction.  [name] T 03/08/2014, 9:18 PM

## 2014-03-08 NOTE — Progress Notes (Signed)
SLP Cancellation Note  Patient Details Name: Shelia Marks MRN: [id number] DOB: 07-11-1926   Cancelled treatment:        Order received for Speech-Language evaluation.  Note plans for d/c to Rehab today.  Will defer SLE to next venue of care (CIR).   Quinn Axe T 03/08/2014, 3:08 PM

## 2014-03-08 NOTE — Progress Notes (Signed)
Physical Therapy Treatment Patient Details Name: Shelia Marks MRN: [id number] DOB: 1926-10-03 Today's Date: 03/08/2014 Time: 9983-3825 PT Time Calculation (min): 22 min  PT Assessment / Plan / Recommendation  History of Present Illness 80 y.o. female presenting with slurred speech and left-sided weakness after falling. TPA was not given secondary to delay in arrival. An MRI revealed a 6 mm acute right pontine infarct.   PT Comments   Pt is progressing well with her mobility.  She continues to demonstrate left arm and leg weakness and still needs mod assist for support during gait.  She was able to progress her gait distance further today.  She is excited to go to inpatient rehab later.    Follow Up Recommendations  CIR     Does the patient have the potential to tolerate intense rehabilitation    Yes  Barriers to Discharge   None      Equipment Recommendations  Rolling walker with 5" wheels    Recommendations for Other Services   None  Frequency Min 4X/week   Progress towards PT Goals Progress towards PT goals: Progressing toward goals  Plan Current plan remains appropriate    Precautions / Restrictions Precautions Precautions: Fall Precaution Comments: left sided weakness   Pertinent Vitals/Pain See vitals flow sheet.    Mobility  Bed Mobility Overal bed mobility: Needs Assistance Bed Mobility: Supine to Sit Supine to sit: Min assist General bed mobility comments: Min assist to support pt as she pulls against therapist to get to sitting.   Transfers Overall transfer level: Needs assistance Equipment used: Rolling walker (2 wheeled) Transfers: Sit to/from Stand Sit to Stand: Min assist General transfer comment: Min assist to support trunk for balance during transitions and manual assist to make contact with left hand on RW.  Verbal cues for safe hand placement.  Ambulation/Gait Ambulation/Gait assistance: Mod assist Ambulation Distance (Feet): 15 Feet Assistive  device: Rolling walker (2 wheeled) Gait Pattern/deviations: Step-to pattern;Trendelenburg;Drifts right/left Gait velocity: decreased Gait velocity interpretation: <1.8 ft/sec, indicative of risk for recurrent falls General Gait Details: Assist need to support trunk for balance and stabilize left knee in stance.  She drifts towards the right side of the walker with her body.  Cues for leg sequencing upright psoture, RW safety and to try to tighten her left knee during stance phase of gait.   Modified Rankin (Stroke Patients Only) Pre-Morbid Rankin Score: No symptoms Modified Rankin: Moderately severe disability      PT Goals (current goals can now be found in the care plan section) Acute Rehab PT Goals Patient Stated Goal: Be independent  Visit Information  Last PT Received On: 03/08/14 Assistance Needed: +1 History of Present Illness: 80 y.o. female presenting with slurred speech and left-sided weakness after falling. TPA was not given secondary to delay in arrival. An MRI revealed a 6 mm acute right pontine infarct.    Subjective Data  Subjective: Pt reports that she is going to rehab today.  Sisters have questions about rehab.   Patient Stated Goal: Be independent   Cognition  Cognition Arousal/Alertness: Awake/alert Behavior During Therapy: Impulsive Overall Cognitive Status: Impaired/Different from baseline Area of Impairment: Safety/judgement Safety/Judgement: Decreased awareness of safety;Decreased awareness of deficits General Comments: Pt needs reinforcement of cues for safety throughout session.      Balance  Balance Overall balance assessment: Needs assistance Sitting-balance support: Feet supported Sitting balance-Leahy Scale: Good Standing balance support: Bilateral upper extremity supported Standing balance-Leahy Scale: Poor  End of Session PT - End  of Session Equipment Utilized During Treatment: Gait belt Activity Tolerance: Patient tolerated treatment  well Patient left: in chair;with call bell/phone within reach;with family/visitor present     Wells Guiles B. Icess Bertoni, PT, DPT [phone]   03/08/2014, 5:14 PM

## 2014-03-08 NOTE — Progress Notes (Signed)
Rehab admissions - Evaluated for possible admission.  I met with patient and her two sisters at the bedside.  All are in agreement to inpatient rehab with plans for sister, [name], to stay with patient after discharge from rehab.  Bed available and can admit to acute inpatient rehab today.  Call me for questions.  #[phone]

## 2014-03-08 NOTE — Discharge Summary (Signed)
Physician Discharge Summary  Patient ID: Shelia Marks MRN: [id number] DOB/AGE: 78-Dec-1927 78 y.o.  Admit date: 03/05/2014 Discharge date: 03/08/2014  Primary Care Physician:  Hollace Kinnier, DO  Discharge Diagnoses:    . acute CVA (cerebral vascular accident) . Hypothyroidism . Hypertension . Hyperlipidemia LDL goal < 100 . CVA (cerebral infarction)  Consults: Neurology   Recommendations for Outpatient Follow-up:   Please note that the Zocor has been discontinued it due to interaction with amlodipine The patient is placed on Lipitor 20 mg daily at bedtime to be continued at discharge from CIR  Allergies:  No Known Allergies   Discharge Medications:   Medication List    STOP taking these medications       simvastatin 40 MG tablet  Commonly known as:  ZOCOR      TAKE these medications       allopurinol 300 MG tablet  Commonly known as:  ZYLOPRIM  Take 1 tablet (300 mg total) by mouth daily.     amLODipine 10 MG tablet  Commonly known as:  NORVASC  Take 1 tablet (10 mg total) by mouth daily.     atenolol 25 MG tablet  Commonly known as:  TENORMIN  Take 1 tablet (25 mg total) by mouth daily.     calcium carbonate 500 MG chewable tablet  Commonly known as:  TUMS - dosed in mg elemental calcium  Chew 1 tablet by mouth as needed for indigestion or heartburn.     levothyroxine 100 MCG tablet  Commonly known as:  SYNTHROID, LEVOTHROID  Take 1 tablet (100 mcg total) by mouth daily. On an empty stomach separate prior to other medications     lisinopril 5 MG tablet  Commonly known as:  PRINIVIL,ZESTRIL  Take 1 tablet (5 mg total) by mouth daily.       Inpatient medications to be continued at final DC from CIR  Lipitor 20 mg daily at bedtime Aspirin 325 mg daily  Brief H and P: For complete details please refer to admission H and P, but in brief Shelia Marks is a 80 y.o. female history of hypertension, hyperlipidemia, hypothyroidism presents to the ER  because of slurred speech and left lower extremity weakness. As per patient's sister, patient was complaining of some dizziness last evening when she was talking to her over the phone. She also stated that she had fallen. When she went to visit her she was limping on her left leg and later on she took her to her house. Later patient's sister felt patient had slurred speech and was brought to the ER. In the ER patient was found to have left facial droop and difficulty moving her left lower extremity. CT head did not show anything acute. On-call neurologist was consulted and patient was admitted for further management. Patient denied any headache blurred vision difficulty speaking or swallowing or any weakness of the upper extremities her right lower extremity. Patient denied any chest pain shortness of breath nausea vomiting or abdominal pain.    Hospital Course:  CVA (cerebral vascular accident): - Still has significant left leg and mod left arm weakness, dysarthria has improved. Rapid response was called on 3/6 for worsening of her symptoms, patient was examined by Dr. Leonel Ramsay, recommended fluid bolus and obtain head CT, may have mild extension of small infarct, likely pontine. CT head showed subcentimeter ischemic insult in the central to slightly left median pons. MRI/MRA brain confirmed right pontine infarct. 2-D echo done, EF 60-65%, normal wall motion, grade  1 diastolic dysfunction. Carotid Dopplers -negative for any critical ICA stenosis Physical therapy evaluation recommended inpatient rehabilitation. Neurology has followed the patient closely and recommended aspirin 325 mg daily for secondary stroke prevention. Patient is continued on statins, placed on Lipitor.  - Lipid panel showed LDL 83, cholesterol 149  - HBA1c normal   Hypothyroidism  -Continue Synthroid   Hypertension  continue atenolol and lisinopril   Hyperlipidemia LDL goal < 100  - Continue statin   ? UTI urine culture showed  55K colonies patient had received IV Rocephin which is now discontinued    Day of Discharge BP 161/70  Pulse 75  Temp(Src) 99.2 F (37.3 C) (Oral)  Resp 18  Ht 5\' 3"  (1.6 m)  Wt 75.751 kg (167 lb)  BMI 29.59 kg/m2  SpO2 100%  Physical Exam:  General: A x O x3, NAD, dysarthria improving, slight facial droop  CVS: S1-S2 clear, no murmur rubs or gallops  Chest: CTAB, no wheezing, rales or rhonchi  Abdomen: soft nontender, nondistended, normal bowel sounds  Extremities: no cyanosis, clubbing or edema noted bilaterally  Neuro: slight facial droop, mild dysarthria, LLE 3/5, LUE 4/5, right upper and lower extremity 5/5   The results of significant diagnostics from this hospitalization (including imaging, microbiology, ancillary and laboratory) are listed below for reference.    LAB RESULTS: Basic Metabolic Panel:  Recent Labs Lab 03/05/14 0414 03/05/14 0843  NA 143 143  K 3.6* 3.5*  CL 105 108  CO2 23 23  GLUCOSE 98 87  BUN 24* 20  CREATININE 0.98 0.94  CALCIUM 10.1 9.6   Liver Function Tests:  Recent Labs Lab 03/05/14 0414 03/05/14 0843  AST 19 16  ALT 12 9  ALKPHOS 80 67  BILITOT 0.6 0.6  PROT 7.8 7.1  ALBUMIN 4.0 3.3*   No results found for this basename: LIPASE, AMYLASE,  in the last 168 hours No results found for this basename: AMMONIA,  in the last 168 hours CBC:  Recent Labs Lab 03/05/14 0414 03/05/14 0843  WBC 6.2 3.6*  NEUTROABS 3.3 2.1  HGB 13.0 11.3*  HCT 39.1 34.9*  MCV 91.1 92.6  PLT 184 152   Cardiac Enzymes: No results found for this basename: CKTOTAL, CKMB, CKMBINDEX, TROPONINI,  in the last 168 hours BNP: No components found with this basename: POCBNP,  CBG:  Recent Labs Lab 03/07/14 0757  GLUCAP 69*    Significant Diagnostic Studies:  Ct Head Wo Contrast  03/06/2014   CLINICAL DATA:  Stroke with weakness on the right side.  EXAM: CT HEAD WITHOUT CONTRAST  TECHNIQUE: Contiguous axial images were obtained from the base of the  skull through the vertex without intravenous contrast.  COMPARISON:  Head CT from yesterday  FINDINGS: Skull and Sinuses:No significant abnormality.  Orbits: Bilateral cataract resection.  Brain: There is low attenuation within the right thalamus, with blurring between the thalamus and the internal capsule. Sub centimeter ischemic insult in the central to slightly left parent median pons. Chronic small vessel ischemic white matter disease with patchy bilateral cerebral white matter low attenuation - stable in pattern. No cortical ischemia detected. No evidence of hemorrhage, hydrocephalus, mass lesion, or shift  These results were called by telephone at the time of interpretation on 03/06/2014 at 1:03 AM to Dr. Roland Rack , who verbally acknowledged these results.  IMPRESSION: 1. 2 subcortical ischemic foci which could be recent, located in the right thalamus and left central pons. 2. No intracranial hemorrhage.   Electronically Signed  By: Jorje Guild M.D.   On: 03/06/2014 01:04   Ct Head Wo Contrast  03/05/2014   CLINICAL DATA:  Fall yesterday.  Slurred speech.  EXAM: CT HEAD WITHOUT CONTRAST  TECHNIQUE: Contiguous axial images were obtained from the base of the skull through the vertex without intravenous contrast.  COMPARISON:  No comparison head CT.  Comparison neck CT 06/14/2014.  FINDINGS: No skull fracture or intracranial hemorrhage.  Small vessel disease type changes. Small remote appearing infarct central pons. No CT evidence of large acute infarct.  No intracranial mass lesion noted on this unenhanced exam.  No hydrocephalus.  Vascular calcifications.  Mastoid air cells, middle ear cavities and visualized paranasal sinuses are clear.  Orbital structures unremarkable.  IMPRESSION: No intracranial hemorrhage or CT evidence of large acute infarct. Please see above.   Electronically Signed   By: Chauncey Cruel M.D.   On: 03/05/2014 05:54   Mr Angiogram Neck W Wo Contrast  03/06/2014   CLINICAL  DATA:  Left-sided weakness and dysarthria.  Fall yesterday.  EXAM: MRI HEAD WITHOUT AND WITH CONTRAST  MRA HEAD WITHOUT CONTRAST  MRA NECK WITHOUT AND WITH CONTRAST  TECHNIQUE: Multiplanar, multiecho pulse sequences of the brain and surrounding structures were obtained without and with intravenous contrast. Angiographic images of the Circle of Willis were obtained using MRA technique without intravenous contrast. Angiographic images of the neck were obtained using MRA technique without and with intravenous contrast. Carotid stenosis measurements (when applicable) are obtained utilizing NASCET criteria, using the distal internal carotid diameter as the denominator.  CONTRAST:  31mL MULTIHANCE GADOBENATE DIMEGLUMINE 529 MG/ML IV SOLN  COMPARISON:  Head CT 03/06/2014  FINDINGS: MRI HEAD FINDINGS  There is a 6 mm acute infarct in the right pons. There is no evidence of intracranial hemorrhage. There is a 5 mm remote lacunar infarct in the pons slightly left of midline. Small, scattered foci of T2 hyperintensity within the subcortical and deep cerebral white matter bilaterally are nonspecific but compatible with mild chronic small vessel ischemic disease. There is mild generalized cerebral atrophy. There is no evidence of mass, midline shift, or extra-axial fluid collection. There is no abnormal enhancement.  Prior bilateral cataract surgery is noted. Minimal bilateral ethmoid and maxillary sinus mucosal thickening is noted. Mastoid air cells are clear. Mild to moderate multilevel spondylosis is partially visualized in the cervical spine.  MRA HEAD FINDINGS  The visualized distal vertebral arteries are patent with the right being dominant. Apparent mild narrowing of the midportion of the left V4 segment is likely artifactual given normal appearance on contrasted neck MRA images. PICA origins are patent bilaterally. Right AICA origin is patent. SCA origins are patent. Basilar artery is patent without stenosis. There is  minimal irregularity of the left greater than right proximal PCAs without evidence of significant stenosis. There is a small, patent left posterior communicating artery.  Internal carotid arteries are patent from skullbase to carotid termini. There is very mild narrowing of the supraclinoid left ICA. The left A1 segment is not visualized, likely congenitally absent. Left A2 is supplied by the anterior communicating artery. Right ACA is unremarkable. Bilateral MCAs are unremarkable. No intracranial aneurysm is identified.  MRA NECK FINDINGS  Three vessel aortic arch. The visualized subclavian arteries are unremarkable. Common carotid arteries are patent without stenosis. Cervical internal carotid arteries are unremarkable. There is antegrade flow in the vertebral arteries. Right vertebral artery is dominant. No vertebral artery stenosis is seen.  IMPRESSION: 1. 6 mm acute right pontine  infarct. 2. Mild chronic small vessel ischemic disease and small, remote left pontine infarct. 3. No evidence of major intracranial arterial occlusion or flow limiting stenosis. 4. Unremarkable appearance of the cervical carotid and vertebral arteries.   Electronically Signed   By: Logan Bores   On: 03/06/2014 10:59   Mr Brain W Wo Contrast  03/06/2014   CLINICAL DATA:  Left-sided weakness and dysarthria.  Fall yesterday.  EXAM: MRI HEAD WITHOUT AND WITH CONTRAST  MRA HEAD WITHOUT CONTRAST  MRA NECK WITHOUT AND WITH CONTRAST  TECHNIQUE: Multiplanar, multiecho pulse sequences of the brain and surrounding structures were obtained without and with intravenous contrast. Angiographic images of the Circle of Willis were obtained using MRA technique without intravenous contrast. Angiographic images of the neck were obtained using MRA technique without and with intravenous contrast. Carotid stenosis measurements (when applicable) are obtained utilizing NASCET criteria, using the distal internal carotid diameter as the denominator.   CONTRAST:  22mL MULTIHANCE GADOBENATE DIMEGLUMINE 529 MG/ML IV SOLN  COMPARISON:  Head CT 03/06/2014  FINDINGS: MRI HEAD FINDINGS  There is a 6 mm acute infarct in the right pons. There is no evidence of intracranial hemorrhage. There is a 5 mm remote lacunar infarct in the pons slightly left of midline. Small, scattered foci of T2 hyperintensity within the subcortical and deep cerebral white matter bilaterally are nonspecific but compatible with mild chronic small vessel ischemic disease. There is mild generalized cerebral atrophy. There is no evidence of mass, midline shift, or extra-axial fluid collection. There is no abnormal enhancement.  Prior bilateral cataract surgery is noted. Minimal bilateral ethmoid and maxillary sinus mucosal thickening is noted. Mastoid air cells are clear. Mild to moderate multilevel spondylosis is partially visualized in the cervical spine.  MRA HEAD FINDINGS  The visualized distal vertebral arteries are patent with the right being dominant. Apparent mild narrowing of the midportion of the left V4 segment is likely artifactual given normal appearance on contrasted neck MRA images. PICA origins are patent bilaterally. Right AICA origin is patent. SCA origins are patent. Basilar artery is patent without stenosis. There is minimal irregularity of the left greater than right proximal PCAs without evidence of significant stenosis. There is a small, patent left posterior communicating artery.  Internal carotid arteries are patent from skullbase to carotid termini. There is very mild narrowing of the supraclinoid left ICA. The left A1 segment is not visualized, likely congenitally absent. Left A2 is supplied by the anterior communicating artery. Right ACA is unremarkable. Bilateral MCAs are unremarkable. No intracranial aneurysm is identified.  MRA NECK FINDINGS  Three vessel aortic arch. The visualized subclavian arteries are unremarkable. Common carotid arteries are patent without  stenosis. Cervical internal carotid arteries are unremarkable. There is antegrade flow in the vertebral arteries. Right vertebral artery is dominant. No vertebral artery stenosis is seen.  IMPRESSION: 1. 6 mm acute right pontine infarct. 2. Mild chronic small vessel ischemic disease and small, remote left pontine infarct. 3. No evidence of major intracranial arterial occlusion or flow limiting stenosis. 4. Unremarkable appearance of the cervical carotid and vertebral arteries.   Electronically Signed   By: Logan Bores   On: 03/06/2014 10:59   Mr Jodene Nam Head/brain Wo Cm  03/06/2014   CLINICAL DATA:  Left-sided weakness and dysarthria.  Fall yesterday.  EXAM: MRI HEAD WITHOUT AND WITH CONTRAST  MRA HEAD WITHOUT CONTRAST  MRA NECK WITHOUT AND WITH CONTRAST  TECHNIQUE: Multiplanar, multiecho pulse sequences of the brain and surrounding structures were obtained  without and with intravenous contrast. Angiographic images of the Circle of Willis were obtained using MRA technique without intravenous contrast. Angiographic images of the neck were obtained using MRA technique without and with intravenous contrast. Carotid stenosis measurements (when applicable) are obtained utilizing NASCET criteria, using the distal internal carotid diameter as the denominator.  CONTRAST:  51mL MULTIHANCE GADOBENATE DIMEGLUMINE 529 MG/ML IV SOLN  COMPARISON:  Head CT 03/06/2014  FINDINGS: MRI HEAD FINDINGS  There is a 6 mm acute infarct in the right pons. There is no evidence of intracranial hemorrhage. There is a 5 mm remote lacunar infarct in the pons slightly left of midline. Small, scattered foci of T2 hyperintensity within the subcortical and deep cerebral white matter bilaterally are nonspecific but compatible with mild chronic small vessel ischemic disease. There is mild generalized cerebral atrophy. There is no evidence of mass, midline shift, or extra-axial fluid collection. There is no abnormal enhancement.  Prior bilateral cataract  surgery is noted. Minimal bilateral ethmoid and maxillary sinus mucosal thickening is noted. Mastoid air cells are clear. Mild to moderate multilevel spondylosis is partially visualized in the cervical spine.  MRA HEAD FINDINGS  The visualized distal vertebral arteries are patent with the right being dominant. Apparent mild narrowing of the midportion of the left V4 segment is likely artifactual given normal appearance on contrasted neck MRA images. PICA origins are patent bilaterally. Right AICA origin is patent. SCA origins are patent. Basilar artery is patent without stenosis. There is minimal irregularity of the left greater than right proximal PCAs without evidence of significant stenosis. There is a small, patent left posterior communicating artery.  Internal carotid arteries are patent from skullbase to carotid termini. There is very mild narrowing of the supraclinoid left ICA. The left A1 segment is not visualized, likely congenitally absent. Left A2 is supplied by the anterior communicating artery. Right ACA is unremarkable. Bilateral MCAs are unremarkable. No intracranial aneurysm is identified.  MRA NECK FINDINGS  Three vessel aortic arch. The visualized subclavian arteries are unremarkable. Common carotid arteries are patent without stenosis. Cervical internal carotid arteries are unremarkable. There is antegrade flow in the vertebral arteries. Right vertebral artery is dominant. No vertebral artery stenosis is seen.  IMPRESSION: 1. 6 mm acute right pontine infarct. 2. Mild chronic small vessel ischemic disease and small, remote left pontine infarct. 3. No evidence of major intracranial arterial occlusion or flow limiting stenosis. 4. Unremarkable appearance of the cervical carotid and vertebral arteries.   Electronically Signed   By: Logan Bores   On: 03/06/2014 10:59    2D ECHO: Study Conclusions  - Left ventricle: The cavity size was normal. There was mild concentric hypertrophy. Systolic  function was normal. The estimated ejection fraction was in the range of 60% to 65%. Wall motion was normal; there were no regional wall motion abnormalities. Doppler parameters are consistent with abnormal left ventricular relaxation (grade 1 diastolic dysfunction). The E/e' ratio is <10, suggesting normal LV filling pressure. - Aortic valve: Trileaflet. Sclerosis without stenosis. Trivial regurgitation. - Mitral valve: Structurally normal valve. Mild regurgitation. - Left atrium: Moderately dilated (35 ml/m2). - Tricuspid valve: Mild regurgitation. - Pulmonic valve: The valve appears to be grossly normal. Mild regurgitation. - Pulmonary arteries: PA peak pressure: 105mm Hg (S). - Inferior vena cava: The vessel was normal in size; the respirophasic diameter changes were in the normal range (= 50%); findings are consistent with normal central venous pressure.    Disposition and Follow-up:  Future Appointments Provider Department Dept Phone   04/12/2014 10:00 AM Gayland Curry, DO [facility] [phone]       DISPOSITION: Inpatient rehabilitation  DIET dysphagia 3, liquids, chopped meats   DISCHARGE FOLLOW-UP Follow-up Information   Follow up with REED, TIFFANY, DO. Schedule an appointment as soon as possible for a visit in 2 weeks. (for hospital follow-up)    Specialty:  Geriatric Medicine   Contact information:   [place] [place] [address] [phone]       Follow up with Forbes Cellar, MD In 2 months. (for hospital follow-up/ stroke)    Specialties:  Neurology, Radiology   Contact information:   [address] [place] [address] [phone]       Time spent on Discharge: 45 minutes  Signed:   Taquilla Downum M.D. [facility] 03/08/2014, 12:45 PM Pager: [phone]

## 2014-03-08 NOTE — H&P (View-Only) (Signed)
Physical Medicine and Rehabilitation Admission H&P    Chief Complaint  Patient presents with  . Aphasia  : Chief complaint: Weakness  HPI: Shelia Marks is a 80 y.o. right-handed female with history of hypertension, hyperlipidemia. Admitted 03/05/2014 with slurred speech and left-sided weakness. Patient lives alone and used a cane prior to admission. MRI of the brain showed a 6 mm acute right pontine infarct. MRA of the head and neck with no major occlusion or stenosis. Carotid Dopplers with no ICA stenosis. Echocardiogram with ejection fraction of 16% grade 1 diastolic dysfunction. Patient did not receive TPA. Neurology services consulted placed on aspirin for CVA prophylaxis as well as subcutaneous Lovenox for DVT prophylaxis. Tolerating a mechanical soft diet with thin liquids. Maintained on intravenous Rocephin presently with urinalysis study showing 55,000 multiple bacterial. Physical therapy evaluation completed 03/06/2014 with recommendations for physical medicine rehabilitation consult. Patient was admitted for comprehensive rehabilitation program   ROS Review of Systems  Gastrointestinal: Positive for constipation.  Musculoskeletal: Positive for joint pain and myalgias.  Neurological: Positive for dizziness.  Leg cramps  All other systems reviewed and are negative  Past Medical History  Diagnosis Date  . Hypothyroidism   . Cancer   . Hypertension   . Sleep related leg cramps   . Senile osteoporosis   . Malignant neoplasm of thyroid gland   . Nontoxic uninodular goiter   . Dizziness and giddiness   . Other symptoms involving cardiovascular system   . Alzheimer's disease   . Varicose veins of lower extremities with inflammation   . Disorder of bone and cartilage, unspecified   . Hyperlipidemia LDL goal < 100   . Unspecified disorder of lipoid metabolism   . Unspecified hereditary and idiopathic peripheral neuropathy   . Phlebitis and thrombophlebitis of unspecified  site   . Other malaise and fatigue   . Stroke    Past Surgical History  Procedure Laterality Date  . Abdominal hysterectomy      DR HUGES  . Knee surgery      right  . Colon surgery    . Thyroid surgery  July 20, 2011  . Parathyroid adenoma removed  1983  . Catract surgery  2008   Family History  Problem Relation Age of Onset  . Diabetes Brother    Social History:  reports that she has never smoked. She has never used smokeless tobacco. She reports that she does not drink alcohol or use illicit drugs. Allergies: No Known Allergies Medications Prior to Admission  Medication Sig Dispense Refill  . allopurinol (ZYLOPRIM) 300 MG tablet Take 1 tablet (300 mg total) by mouth daily.  90 tablet  3  . amLODipine (NORVASC) 10 MG tablet Take 1 tablet (10 mg total) by mouth daily.  90 tablet  3  . atenolol (TENORMIN) 25 MG tablet Take 1 tablet (25 mg total) by mouth daily.  90 tablet  3  . calcium carbonate (TUMS - DOSED IN MG ELEMENTAL CALCIUM) 500 MG chewable tablet Chew 1 tablet by mouth as needed for indigestion or heartburn.      . levothyroxine (SYNTHROID, LEVOTHROID) 100 MCG tablet Take 1 tablet (100 mcg total) by mouth daily. On an empty stomach separate prior to other medications  90 tablet  3  . lisinopril (PRINIVIL,ZESTRIL) 5 MG tablet Take 1 tablet (5 mg total) by mouth daily.  90 tablet  3  . [DISCONTINUED] simvastatin (ZOCOR) 40 MG tablet Take 1 tablet (40 mg total) by mouth at bedtime.  90 tablet  3    Home: Home Living Family/patient expects to be discharged to:: Private residence Living Arrangements: Alone Type of Home: House Home Access: Stairs to enter Technical brewer of Steps: 2 Entrance Stairs-Rails: Right Home Layout: One level Home Equipment: None   Functional History:    Functional Status:  Mobility: min assist for transfers and mobility     Ambulation/Gait Ambulation Distance (Feet): 5 Feet Gait velocity: decr General Gait Details: Assist to  advance and place LLE. Lt knee hyperextending in stance. Verbal cues to stay closer to walker.    ADL: ADL Eating/Feeding: Set up;Supervision/safety Where Assessed - Eating/Feeding: Edge of bed Grooming: Minimal assistance Where Assessed - Grooming: Supported sitting Upper Body Bathing: Minimal assistance Where Assessed - Upper Body Bathing: Supported sitting Lower Body Bathing: Maximal assistance Where Assessed - Lower Body Bathing: Supported sit to stand Upper Body Dressing: Minimal assistance Where Assessed - Upper Body Dressing: Supported sitting Lower Body Dressing: Maximal assistance Where Assessed - Lower Body Dressing: Supported sit to Lobbyist: Moderate assistance Toilet Transfer Method: Sit to stand Equipment Used: Gait belt Transfers/Ambulation Related to ADLs: mod A. Did not ambulate ADL Comments: c/o dizziness, affecting midline postural control and balance  Cognition: Cognition Overall Cognitive Status: Impaired/Different from baseline Orientation Level: Oriented to person;Oriented to place;Oriented to time;Oriented to situation;Other (comment) (forgetful) Cognition Arousal/Alertness: Awake/alert Behavior During Therapy: Impulsive Overall Cognitive Status: Impaired/Different from baseline Area of Impairment: Safety/judgement Safety/Judgement: Decreased awareness of safety;Decreased awareness of deficits  Physical Exam: Blood pressure 161/70, pulse 75, temperature 99.2 F (37.3 C), temperature source Oral, resp. rate 18, height 5\' 3"  (1.6 m), weight 75.751 kg (167 lb), SpO2 100.00%. Physical Exam Constitutional: She is oriented to person, place, and time.  HENT: oral mucosa pink and moist,  Head: Normocephalic. atraumatic Eyes: EOM are normal.  Neck: Normal range of motion. Neck supple. No thyromegaly present.  Cardiovascular: Normal rate and regular rhythm. No murmur Respiratory: Effort normal and breath sounds normal. No respiratory distress.    GI: Soft. Bowel sounds are normal. She exhibits no distension.  Neurological: She is alert and oriented to person, place, and time.  Speech is mildly slurred but fully intelligible. Left central 7 and mild tongue deviation. LUE is 2/5 deltoid, bicep, and tricep, HI and wrist 2+, LLE is 2 HF, KE and 2- at ankle. Sensory exam grossly intact. Good sitting balance. DTR's 1+ on left and right.  Skin: Skin is warm and dry.  Psychiatric: She has a normal mood and affect. Her behavior is normal. Judgment and thought content normal  Results for orders placed during the hospital encounter of 03/05/14 (from the past 48 hour(s))  GLUCOSE, CAPILLARY     Status: Abnormal   Collection Time    03/07/14  7:57 AM      Result Value Ref Range   Glucose-Capillary 69 (*) 70 - 99 mg/dL   No results found.  Post Admission Physician Evaluation: 1. Functional deficits secondary  to thrombotic right pontine infarct. 2. Patient is admitted to receive collaborative, interdisciplinary care between the physiatrist, rehab nursing staff, and therapy team. 3. Patient's level of medical complexity and substantial therapy needs in context of that medical necessity cannot be provided at a lesser intensity of care such as a SNF. 4. Patient has experienced substantial functional loss from his/her baseline which was documented above under the "Functional History" and "Functional Status" headings.  Judging by the patient's diagnosis, physical exam, and functional history, the patient has potential  for functional progress which will result in measurable gains while on inpatient rehab.  These gains will be of substantial and practical use upon discharge  in facilitating mobility and self-care at the household level. 5. Physiatrist will provide 24 hour management of medical needs as well as oversight of the therapy plan/treatment and provide guidance as appropriate regarding the interaction of the two. 6. 24 hour rehab nursing will  assist with bladder management, bowel management, safety, skin/wound care, disease management, medication administration, pain management and patient education  and help integrate therapy concepts, techniques,education, etc. 7. PT will assess and treat for/with: Lower extremity strength, range of motion, stamina, balance, functional mobility, safety, adaptive techniques and equipment, NMR, fine motor coordination, stroke education.   Goals are: mod I to supervision . 8. OT will assess and treat for/with: ADL's, functional mobility, safety, upper extremity strength, adaptive techniques and equipment, NMR, fine motor coordination, stroke ed.   Goals are: mod I to supervision 9. SLP will assess and treat for/with: speech intelligibility and swallowing.  Goals are: mod I. 10. Case Management and Social Worker will assess and treat for psychological issues and discharge planning. 11. Team conference will be held weekly to assess progress toward goals and to determine barriers to discharge. 12. Patient will receive at least 3 hours of therapy per day at least 5 days per week. 13. ELOS: 10-13 days       14. Prognosis:  excellent   Medical Problem List and Plan: 1. Thrombotic right pontine infarct 2. DVT Prophylaxis/Anticoagulation: Subcutaneous Lovenox. Monitor platelet counts and any signs of bleeding 3. Pain Management/osteoarthritis: Hydrocodone as needed. Monitor with increased mobility.  4. Neuropsych: This patient is capable of making decisions on her own behalf. 5. Dysphagia. Dysphagia 3 and thin liquids /followup speech therapy 6. Hypertension. Norvasc 10 mg daily, Tenormin 25 mg daily, lisinopril 5 mg daily. Monitor with increased mobility 7. Hypothyroidism. Synthroid. Latest TSH level I.686 8. Hyperlipidemia. Lipitor   Meredith Staggers, MD, [facility]  03/08/2014

## 2014-03-08 NOTE — Progress Notes (Signed)
Occupational Therapy Evaluation Patient Details Name: Shelia Marks MRN: [id number] DOB: 07-29-1926 Today's Date: 03/08/2014 Time: 2878-6767 OT Time Calculation (min): 30 min  OT Assessment / Plan / Recommendation History of present illness 80 y.o. female presenting with slurred speech and left-sided weakness after falling. TPA was not given secondary to delay in arrival. An MRI revealed a 6 mm acute right pontine infarct.   Clinical Impression   PTA, pt independent with ADL and mobility and lived alone. Pt presents with significant deficits listed below. Pt requires mod A with ADL and functional mobility for ADL. Pt is Pt will benefit from skilled OT services to facilitate D/C to CIR due to below deficits.excellent CIR candidate.     OT Assessment  Patient needs continued OT Services    Follow Up Recommendations  CIR;Supervision/Assistance - 24 hour    Barriers to Discharge   unsure of caregiver support  Equipment Recommendations  3 in 1 bedside comode;Tub/shower bench    Recommendations for Other Services Rehab consult  Frequency  Min 3X/week    Precautions / Restrictions Precautions Precautions: Fall Restrictions Weight Bearing Restrictions: No   Pertinent Vitals/Pain no apparent distress     ADL  Eating/Feeding: Set up;Supervision/safety Where Assessed - Eating/Feeding: Edge of bed Grooming: Minimal assistance Where Assessed - Grooming: Supported sitting Upper Body Bathing: Minimal assistance Where Assessed - Upper Body Bathing: Supported sitting Lower Body Bathing: Maximal assistance Where Assessed - Lower Body Bathing: Supported sit to stand Upper Body Dressing: Minimal assistance Where Assessed - Upper Body Dressing: Supported sitting Lower Body Dressing: Maximal assistance Where Assessed - Lower Body Dressing: Supported sit to Lobbyist: Moderate assistance Toilet Transfer Method: Sit to stand Equipment Used: Gait belt Transfers/Ambulation  Related to ADLs: mod A. Did not ambulate ADL Comments: c/o dizziness, affecting midline postural control and balance    OT Diagnosis: Generalized weakness;Disturbance of vision;Hemiplegia non-dominant side  OT Problem List: Decreased strength;Decreased range of motion;Decreased activity tolerance;Impaired balance (sitting and/or standing);Impaired vision/perception;Decreased coordination;Decreased cognition;Decreased safety awareness;Decreased knowledge of use of DME or AE;Decreased knowledge of precautions;Impaired UE functional use OT Treatment Interventions: Self-care/ADL training;Therapeutic exercise;Neuromuscular education;DME and/or AE instruction;Therapeutic activities;Visual/perceptual remediation/compensation;Patient/family education;Balance training;Cognitive remediation/compensation   OT Goals(Current goals can be found in the care plan section) Acute Rehab OT Goals Patient Stated Goal: Be independent OT Goal Formulation: With patient Time For Goal Achievement: 03/22/14 Potential to Achieve Goals: Good  Visit Information  Last OT Received On: 03/08/14 Assistance Needed: +1 History of Present Illness: 80 y.o. female presenting with slurred speech and left-sided weakness after falling. TPA was not given secondary to delay in arrival. An MRI revealed a 6 mm acute right pontine infarct.       Prior [place] expects to be discharged to:: Private residence Living Arrangements: Alone Type of Home: House Home Access: Stairs to enter Technical brewer of Steps: 2 Entrance Stairs-Rails: Right Home Layout: One level Home Equipment: None Prior Function Level of Independence: Independent Communication Communication: Expressive difficulties (dysarthric) Dominant Hand: Right         Vision/Perception Vision - History Baseline Vision: Wears glasses only for reading Visual History: Cataracts;Corrective eye surgery Patient Visual Report:  Blurring of vision;No change from baseline Vision - Assessment Eye Alignment: Within Functional Limits Vision Assessment: Vision impaired - to be further tested in functional context Additional Comments: poor saccades. Inable to maintain visual fixation out of midline on L. will further assess Perception Perception: Within Functional Limits   Cognition  Cognition Arousal/Alertness: Awake/alert Behavior During Therapy: Impulsive Overall Cognitive Status: Impaired/Different from baseline Area of Impairment: Safety/judgement Safety/Judgement: Decreased awareness of safety;Decreased awareness of deficits    Extremity/Trunk Assessment Upper Extremity Assessment Upper Extremity Assessment: LUE deficits/detail;RUE deficits/detail RUE Deficits / Details: ? RTC dysfunction  - PTA LUE Deficits / Details: Able to complete isolated movements.  generalized wekness. poor coordination. ? inattention LUE Coordination: decreased fine motor;decreased gross motor (finger to nose 5/hits in 10 secs L. 10 hits in 10 secs R) Cervical / Trunk Assessment Cervical / Trunk Assessment: Other exceptions Cervical / Trunk Exceptions: R bias     Mobility Bed Mobility Overal bed mobility: Needs Assistance Bed Mobility: Supine to Sit;Sit to Supine Supine to sit: Min assist Sit to supine: Mod assist Transfers Overall transfer level: Needs assistance Equipment used: 1 person hand held assist Transfers: Sit to/from Stand Sit to Stand: Mod assist Stand pivot transfers: Mod assist General transfer comment: difficulty coing to upright position due to dizziness and c/o L knee not "working"     Exercise     Balance Balance Overall balance assessment: Needs assistance Sitting-balance support: Bilateral upper extremity supported;Feet supported Sitting balance-Leahy Scale: Fair Sitting balance - Comments: posterior lean at times Postural control: Posterior lean;Right lateral lean Standing balance support:  Bilateral upper extremity supported;During functional activity Standing balance-Leahy Scale: Poor   End of Session OT - End of Session Equipment Utilized During Treatment: Gait belt Activity Tolerance: Patient tolerated treatment well Patient left: in bed;with call bell/phone within reach;with bed alarm set Nurse Communication: Mobility status  GO     Eoin Willden,HILLARY 03/08/2014, 11:53 AM Maurie Boettcher, OTR/L  [phone] 03/08/2014

## 2014-03-08 NOTE — H&P (Signed)
Physical Medicine and Rehabilitation Admission H&P    Chief Complaint  Patient presents with  . Aphasia  : Chief complaint: Weakness  HPI: Shelia Marks is a 80 y.o. right-handed female with history of hypertension, hyperlipidemia. Admitted 03/05/2014 with slurred speech and left-sided weakness. Patient lives alone and used a cane prior to admission. MRI of the brain showed a 6 mm acute right pontine infarct. MRA of the head and neck with no major occlusion or stenosis. Carotid Dopplers with no ICA stenosis. Echocardiogram with ejection fraction of 16% grade 1 diastolic dysfunction. Patient did not receive TPA. Neurology services consulted placed on aspirin for CVA prophylaxis as well as subcutaneous Lovenox for DVT prophylaxis. Tolerating a mechanical soft diet with thin liquids. Maintained on intravenous Rocephin presently with urinalysis study showing 55,000 multiple bacterial. Physical therapy evaluation completed 03/06/2014 with recommendations for physical medicine rehabilitation consult. Patient was admitted for comprehensive rehabilitation program   ROS Review of Systems  Gastrointestinal: Positive for constipation.  Musculoskeletal: Positive for joint pain and myalgias.  Neurological: Positive for dizziness.  Leg cramps  All other systems reviewed and are negative  Past Medical History  Diagnosis Date  . Hypothyroidism   . Cancer   . Hypertension   . Sleep related leg cramps   . Senile osteoporosis   . Malignant neoplasm of thyroid gland   . Nontoxic uninodular goiter   . Dizziness and giddiness   . Other symptoms involving cardiovascular system   . Alzheimer's disease   . Varicose veins of lower extremities with inflammation   . Disorder of bone and cartilage, unspecified   . Hyperlipidemia LDL goal < 100   . Unspecified disorder of lipoid metabolism   . Unspecified hereditary and idiopathic peripheral neuropathy   . Phlebitis and thrombophlebitis of unspecified  site   . Other malaise and fatigue   . Stroke    Past Surgical History  Procedure Laterality Date  . Abdominal hysterectomy      DR HUGES  . Knee surgery      right  . Colon surgery    . Thyroid surgery  July 20, 2011  . Parathyroid adenoma removed  1983  . Catract surgery  2008   Family History  Problem Relation Age of Onset  . Diabetes Brother    Social History:  reports that she has never smoked. She has never used smokeless tobacco. She reports that she does not drink alcohol or use illicit drugs. Allergies: No Known Allergies Medications Prior to Admission  Medication Sig Dispense Refill  . allopurinol (ZYLOPRIM) 300 MG tablet Take 1 tablet (300 mg total) by mouth daily.  90 tablet  3  . amLODipine (NORVASC) 10 MG tablet Take 1 tablet (10 mg total) by mouth daily.  90 tablet  3  . atenolol (TENORMIN) 25 MG tablet Take 1 tablet (25 mg total) by mouth daily.  90 tablet  3  . calcium carbonate (TUMS - DOSED IN MG ELEMENTAL CALCIUM) 500 MG chewable tablet Chew 1 tablet by mouth as needed for indigestion or heartburn.      . levothyroxine (SYNTHROID, LEVOTHROID) 100 MCG tablet Take 1 tablet (100 mcg total) by mouth daily. On an empty stomach separate prior to other medications  90 tablet  3  . lisinopril (PRINIVIL,ZESTRIL) 5 MG tablet Take 1 tablet (5 mg total) by mouth daily.  90 tablet  3  . [DISCONTINUED] simvastatin (ZOCOR) 40 MG tablet Take 1 tablet (40 mg total) by mouth at bedtime.  90 tablet  3    Home: Home Living Family/patient expects to be discharged to:: Private residence Living Arrangements: Alone Type of Home: House Home Access: Stairs to enter Technical brewer of Steps: 2 Entrance Stairs-Rails: Right Home Layout: One level Home Equipment: None   Functional History:    Functional Status:  Mobility: min assist for transfers and mobility     Ambulation/Gait Ambulation Distance (Feet): 5 Feet Gait velocity: decr General Gait Details: Assist to  advance and place LLE. Lt knee hyperextending in stance. Verbal cues to stay closer to walker.    ADL: ADL Eating/Feeding: Set up;Supervision/safety Where Assessed - Eating/Feeding: Edge of bed Grooming: Minimal assistance Where Assessed - Grooming: Supported sitting Upper Body Bathing: Minimal assistance Where Assessed - Upper Body Bathing: Supported sitting Lower Body Bathing: Maximal assistance Where Assessed - Lower Body Bathing: Supported sit to stand Upper Body Dressing: Minimal assistance Where Assessed - Upper Body Dressing: Supported sitting Lower Body Dressing: Maximal assistance Where Assessed - Lower Body Dressing: Supported sit to Lobbyist: Moderate assistance Toilet Transfer Method: Sit to stand Equipment Used: Gait belt Transfers/Ambulation Related to ADLs: mod A. Did not ambulate ADL Comments: c/o dizziness, affecting midline postural control and balance  Cognition: Cognition Overall Cognitive Status: Impaired/Different from baseline Orientation Level: Oriented to person;Oriented to place;Oriented to time;Oriented to situation;Other (comment) (forgetful) Cognition Arousal/Alertness: Awake/alert Behavior During Therapy: Impulsive Overall Cognitive Status: Impaired/Different from baseline Area of Impairment: Safety/judgement Safety/Judgement: Decreased awareness of safety;Decreased awareness of deficits  Physical Exam: Blood pressure 161/70, pulse 75, temperature 99.2 F (37.3 C), temperature source Oral, resp. rate 18, height 5\' 3"  (1.6 m), weight 75.751 kg (167 lb), SpO2 100.00%. Physical Exam Constitutional: She is oriented to person, place, and time.  HENT: oral mucosa pink and moist,  Head: Normocephalic. atraumatic Eyes: EOM are normal.  Neck: Normal range of motion. Neck supple. No thyromegaly present.  Cardiovascular: Normal rate and regular rhythm. No murmur Respiratory: Effort normal and breath sounds normal. No respiratory distress.    GI: Soft. Bowel sounds are normal. She exhibits no distension.  Neurological: She is alert and oriented to person, place, and time.  Speech is mildly slurred but fully intelligible. Left central 7 and mild tongue deviation. LUE is 2/5 deltoid, bicep, and tricep, HI and wrist 2+, LLE is 2 HF, KE and 2- at ankle. Sensory exam grossly intact. Good sitting balance. DTR's 1+ on left and right.  Skin: Skin is warm and dry.  Psychiatric: She has a normal mood and affect. Her behavior is normal. Judgment and thought content normal  Results for orders placed during the hospital encounter of 03/05/14 (from the past 48 hour(s))  GLUCOSE, CAPILLARY     Status: Abnormal   Collection Time    03/07/14  7:57 AM      Result Value Ref Range   Glucose-Capillary 69 (*) 70 - 99 mg/dL   No results found.  Post Admission Physician Evaluation: 1. Functional deficits secondary  to thrombotic right pontine infarct. 2. Patient is admitted to receive collaborative, interdisciplinary care between the physiatrist, rehab nursing staff, and therapy team. 3. Patient's level of medical complexity and substantial therapy needs in context of that medical necessity cannot be provided at a lesser intensity of care such as a SNF. 4. Patient has experienced substantial functional loss from his/her baseline which was documented above under the "Functional History" and "Functional Status" headings.  Judging by the patient's diagnosis, physical exam, and functional history, the patient has potential  for functional progress which will result in measurable gains while on inpatient rehab.  These gains will be of substantial and practical use upon discharge  in facilitating mobility and self-care at the household level. 5. Physiatrist will provide 24 hour management of medical needs as well as oversight of the therapy plan/treatment and provide guidance as appropriate regarding the interaction of the two. 6. 24 hour rehab nursing will  assist with bladder management, bowel management, safety, skin/wound care, disease management, medication administration, pain management and patient education  and help integrate therapy concepts, techniques,education, etc. 7. PT will assess and treat for/with: Lower extremity strength, range of motion, stamina, balance, functional mobility, safety, adaptive techniques and equipment, NMR, fine motor coordination, stroke education.   Goals are: mod I to supervision . 8. OT will assess and treat for/with: ADL's, functional mobility, safety, upper extremity strength, adaptive techniques and equipment, NMR, fine motor coordination, stroke ed.   Goals are: mod I to supervision 9. SLP will assess and treat for/with: speech intelligibility and swallowing.  Goals are: mod I. 10. Case Management and Social Worker will assess and treat for psychological issues and discharge planning. 11. Team conference will be held weekly to assess progress toward goals and to determine barriers to discharge. 12. Patient will receive at least 3 hours of therapy per day at least 5 days per week. 13. ELOS: 10-13 days       14. Prognosis:  excellent   Medical Problem List and Plan: 1. Thrombotic right pontine infarct 2. DVT Prophylaxis/Anticoagulation: Subcutaneous Lovenox. Monitor platelet counts and any signs of bleeding 3. Pain Management/osteoarthritis: Hydrocodone as needed. Monitor with increased mobility.  4. Neuropsych: This patient is capable of making decisions on her own behalf. 5. Dysphagia. Dysphagia 3 and thin liquids /followup speech therapy 6. Hypertension. Norvasc 10 mg daily, Tenormin 25 mg daily, lisinopril 5 mg daily. Monitor with increased mobility 7. Hypothyroidism. Synthroid. Latest TSH level I.686 8. Hyperlipidemia. Lipitor   Meredith Staggers, MD, [facility]  03/08/2014

## 2014-03-08 NOTE — Care Management Note (Signed)
    Page 1 of 1   03/08/2014     1:49:53 PM   CARE MANAGEMENT NOTE 03/08/2014  Patient:  Shelia Marks,Shelia Marks   Account Number:  [id number]  Date Initiated:  03/08/2014  Documentation initiated by:  GRAVES-BIGELOW,Mikhala Kenan  Subjective/Objective Assessment:   Pt admitted for CVA.     Action/Plan:   CIR to admit pt today. No needs from CM at this time.   Anticipated DC Date:  03/08/2014   Anticipated DC Plan:  IP REHAB FACILITY      DC Planning Services  CM consult      Choice offered to / List presented to:             Status of service:  Completed, signed off Medicare Important Message given?   (If response is "NO", the following Medicare IM given date fields will be blank) Date Medicare IM given:   Date Additional Medicare IM given:    Discharge Disposition:  IP REHAB FACILITY  Per UR Regulation:  Reviewed for med. necessity/level of care/duration of stay  If discussed at [place] of Stay Meetings, dates discussed:    Comments:

## 2014-03-08 NOTE — PMR Pre-admission (Signed)
PMR Admission Coordinator Pre-Admission Assessment  Patient: Shelia Marks is an 80 y.o., female MRN: [id number] DOB: 03-22-1926 Height: 5\' 3"  (160 cm) Weight: 75.751 kg (167 lb)              Insurance Information HMO:      PPO:       PCP:       IPA:       80/20:       OTHER:   PRIMARY: Medicare A/B      Policy#: [id number]      Subscriber: Shelia Marks CM Name:        Phone#:       Fax#:   Pre-Cert#:        Employer: Retired Benefits:  Phone #:       Name: [facility]. Date: 11/30/93     Deduct: $1260      Out of Pocket Max: none      Life Max: unlimited CIR: 100%      SNF: 100 days Outpatient: 80%     Co-Pay: 20% Home Health: 100%      Co-Pay: none DME: 80%     Co-Pay: 20% Providers: patient's choice  SECONDARY: [employer]      Policy#: [id number]      Subscriber: Shelia Marks CM Name:        Phone#:       Fax#:   Pre-Cert#:        Employer: Retired Benefits:  Phone #: [phone]     Name:   Eff. Date:       Deduct:        Out of Pocket Max:        Life Max:   CIR:        SNF:   Outpatient:       Co-Pay:   Home Health:        Co-Pay:   DME:       Co-Pay:    Emergency Contact Information Contact Information   Name Relation Home Work Mobile   Shelia Marks Sister [phone]  [phone]     Current Medical History  Patient Admitting Diagnosis:  R Pontine infarct  History of Present Illness: An 80 y.o. right-handed female with history of hypertension, hyperlipidemia. Admitted 03/05/2014 with slurred speech and left-sided weakness. Patient lives alone and used a cane prior to admission. MRI of the brain showed a 6 mm acute right pontine infarct. MRA of the head and neck with no major occlusion or stenosis. Carotid Dopplers with no ICA stenosis. Echocardiogram with ejection fraction of 59% grade 1 diastolic dysfunction. Patient did not receive TPA. Neurology services consulted placed on aspirin for CVA prophylaxis as well as subcutaneous Lovenox for  DVT prophylaxis. Tolerating a mechanical soft diet with thin liquids. Maintained on intravenous Rocephin presently with urinalysis study showing 55,000 multiple bacterial. Physical therapy evaluation completed 03/06/2014 with recommendations for physical medicine rehabilitation consult.    Total: 4=NIH  Past Medical History  Past Medical History  Diagnosis Date  . Hypothyroidism   . Cancer   . Hypertension   . Sleep related leg cramps   . Senile osteoporosis   . Malignant neoplasm of thyroid gland   . Nontoxic uninodular goiter   . Dizziness and giddiness   . Other symptoms involving cardiovascular system   . Alzheimer's disease   . Varicose veins of lower extremities with inflammation   . Disorder of bone and cartilage, unspecified   . Hyperlipidemia LDL  goal < 100   . Unspecified disorder of lipoid metabolism   . Unspecified hereditary and idiopathic peripheral neuropathy   . Phlebitis and thrombophlebitis of unspecified site   . Other malaise and fatigue   . Stroke     Family History  family history includes Diabetes in her brother.  Prior Rehab/Hospitalizations:  Had outpatient therapy after TKR 10-12 yrs ago.   Current Medications  Current facility-administered medications:allopurinol (ZYLOPRIM) tablet 300 mg, 300 mg, Oral, Daily, Rise Patience, MD, 300 mg at 03/08/14 0930;  amLODipine (NORVASC) tablet 10 mg, 10 mg, Oral, Daily, Rise Patience, MD, 10 mg at 03/08/14 0930;  aspirin suppository 300 mg, 300 mg, Rectal, Daily, Rise Patience, MD;  aspirin tablet 325 mg, 325 mg, Oral, Daily, Rise Patience, MD, 325 mg at 03/08/14 0930 atenolol (TENORMIN) tablet 25 mg, 25 mg, Oral, Daily, Rise Patience, MD, 25 mg at 03/08/14 0930;  atorvastatin (LIPITOR) tablet 20 mg, 20 mg, Oral, q1800, Ripudeep K Rai, MD, 20 mg at 03/07/14 1722;  calcium carbonate (TUMS - dosed in mg elemental calcium) chewable tablet 200 mg of elemental calcium, 1 tablet, Oral, PRN,  Rise Patience, MD enoxaparin (LOVENOX) injection 40 mg, 40 mg, Subcutaneous, Q24H, Rise Patience, MD, 40 mg at 03/08/14 [id number];  HYDROcodone-acetaminophen (NORCO/VICODIN) 5-325 MG per tablet 1 tablet, 1 tablet, Oral, Q6H PRN, Oswald Hillock, MD;  levothyroxine (SYNTHROID, LEVOTHROID) tablet 100 mcg, 100 mcg, Oral, QAC breakfast, Rise Patience, MD, 100 mcg at 03/08/14 0636 lisinopril (PRINIVIL,ZESTRIL) tablet 5 mg, 5 mg, Oral, Daily, Rise Patience, MD, 5 mg at 03/08/14 [id number];  senna-docusate (Senokot-S) tablet 1 tablet, 1 tablet, Oral, QHS PRN, Rise Patience, MD  Patients Current Diet: Dysphagia  Precautions / Restrictions Precautions Precautions: Fall Restrictions Weight Bearing Restrictions: No   Prior Activity Level Limited Community (1-2x/wk): Went out 2 X a week to store and to [employer] / [place] Devices/Equipment: Radio producer (specify quad or straight) Home Equipment: None  Prior Functional Level Prior Function Level of Independence: Independent  Current Functional Level Cognition  Overall Cognitive Status: Impaired/Different from baseline Orientation Level: Oriented to person;Oriented to place;Oriented to time;Oriented to situation;Other (comment) (forgetful) Safety/Judgement: Decreased awareness of safety;Decreased awareness of deficits    Extremity Assessment (includes Sensation/Coordination)          ADLs  Eating/Feeding: Set up;Supervision/safety  Where Assessed - Eating/Feeding: Edge of bed  Grooming: Minimal assistance  Where Assessed - Grooming: Supported sitting  Upper Body Bathing: Minimal assistance  Where Assessed - Upper Body Bathing: Supported sitting  Lower Body Bathing: Maximal assistance  Where Assessed - Lower Body Bathing: Supported sit to stand  Upper Body Dressing: Minimal assistance  Where Assessed - Upper Body Dressing: Supported sitting  Lower Body Dressing: Maximal assistance  Where  Assessed - Lower Body Dressing: Supported sit to Retail buyer: Moderate assistance  Toilet Transfer Method: Sit to stand  Equipment Used: Gait belt  Transfers/Ambulation Related to ADLs: mod A. Did not ambulate  ADL Comments: c/o dizziness, affecting midline postural control and balance      Mobility  Overal bed mobility: Needs Assistance Bed Mobility: Supine to Sit;Sit to Supine Supine to sit: Min assist Sit to supine: Mod assist General bed mobility comments: Assist to bring trunk up.    Transfers  Overall transfer level: Needs assistance Equipment used: 1 person hand held assist Transfers: Sit to/from Stand Sit to Stand: Mod assist Stand pivot transfers:  Mod assist General transfer comment: difficulty coing to upright position due to dizziness and c/o L knee not "working"    Ambulation / Armed forces logistics/support/administrative officer / Stairs / Emergency planning/management officer  Ambulation/Gait Ambulation/Gait assistance: Museum/gallery curator (Feet): 5 Feet Assistive device: Rolling walker (2 wheeled) Gait Pattern/deviations: Step-to pattern;Decreased step length - left;Decreased stance time - left Gait velocity: decr Gait velocity interpretation: Below normal speed for age/gender General Gait Details: Assist to advance and place LLE. Lt knee hyperextending in stance. Verbal cues to stay closer to walker.    Posture / Balance Dynamic Sitting Balance Sitting balance - Comments: posterior lean at times    Special needs/care consideration BiPAP/CPAP No CPM No Continuous Drip IV No Dialysis No      Life Vest No Oxygen No Special Bed No Trach Size No Wound Vac (area) No     Skin Has dry skin                              Bowel mgmt: Last documented BM 03/04/14 Bladder mgmt: Urinary frequency with incontinence of urine at times.  Wears depends at times. Diabetic mgmt No    Previous Home Environment Living Arrangements: Alone Type of Home: House Home Layout: One level Home Access: Stairs to  enter Entrance Stairs-Rails: Right Entrance Stairs-Number of Steps: 2 Bathroom Shower/Tub: Chiropodist: Villanueva: No  Discharge Living Setting Plans for Discharge Living Setting: Patient's home;Alone;House (Sister plans to stay with patient.) Type of Home at Discharge: House Discharge Home Layout: One level Discharge Home Access: Stairs to enter Entrance Stairs-Number of Steps: 2 steps at back entry Does the patient have any problems obtaining your medications?: No  Social/Family/Support Systems Patient Roles: Other (Comment) (Has a sister.) Contact Information: Bryon Lions - sister Anticipated Caregiver: sister Anticipated Caregiver's Contact Information: [name] - (h) [phone] (c) [phone] Ability/Limitations of Caregiver: Sister is retired and can assist.  Sister has offered to go stay with patient in patient's home at discharge to provide supervision Caregiver Availability: 24/7 Discharge Plan Discussed with Primary Caregiver: Yes (Spoke with 2 sisters in patient's room.) Is Caregiver In Agreement with Plan?: Yes Does Caregiver/Family have Issues with Lodging/Transportation while Pt is in Rehab?: No  Goals/Additional Needs Patient/Family Goal for Rehab: PT S, OT S/Min, ST S/Min assist Expected length of stay: 10-14 days Cultural Considerations: Mina Marble, attends church on [address] Dietary Needs: Dys 3, thin liquids Equipment Needs: TBD Pt/Family Agrees to Admission and willing to participate: Yes Program Orientation Provided & Reviewed with Pt/Caregiver Including Roles  & Responsibilities: Yes  Decrease burden of Care through IP rehab admission: N/A  Possible need for SNF placement upon discharge: Not planned  Patient Condition: This patient's condition remains as documented in the consult dated 03/08/14, in which the Rehabilitation Physician determined and documented that the patient's condition is appropriate for intensive  rehabilitative care in an inpatient rehabilitation facility. Will admit to inpatient rehab today.  Preadmission Screen Completed By:  Retta Diones, 03/08/2014 1:13 PM ______________________________________________________________________   Discussed status with Dr. Naaman Plummer on 03/08/14 at 1328 and received telephone approval for admission today.  Admission Coordinator:  Retta Diones, time1328/Date03/09/15

## 2014-03-09 ENCOUNTER — Inpatient Hospital Stay (HOSPITAL_COMMUNITY): Payer: Medicare Other | Admitting: Physical Therapy

## 2014-03-09 ENCOUNTER — Inpatient Hospital Stay (HOSPITAL_COMMUNITY): Payer: Medicare Other | Admitting: Speech Pathology

## 2014-03-09 ENCOUNTER — Inpatient Hospital Stay (HOSPITAL_COMMUNITY): Payer: Medicare Other

## 2014-03-09 DIAGNOSIS — I69920 Aphasia following unspecified cerebrovascular disease: Secondary | ICD-10-CM

## 2014-03-09 DIAGNOSIS — I10 Essential (primary) hypertension: Secondary | ICD-10-CM

## 2014-03-09 DIAGNOSIS — I6330 Cerebral infarction due to thrombosis of unspecified cerebral artery: Secondary | ICD-10-CM

## 2014-03-09 DIAGNOSIS — I69991 Dysphagia following unspecified cerebrovascular disease: Secondary | ICD-10-CM

## 2014-03-09 LAB — CBC WITH DIFFERENTIAL/PLATELET
Basophils Absolute: 0 10*3/uL (ref 0.0–0.1)
Basophils Relative: 0 % (ref 0–1)
Eosinophils Absolute: 0.3 10*3/uL (ref 0.0–0.7)
Eosinophils Relative: 6 % — ABNORMAL HIGH (ref 0–5)
HCT: 36 % (ref 36.0–46.0)
Hemoglobin: 11.6 g/dL — ABNORMAL LOW (ref 12.0–15.0)
Lymphocytes Relative: 37 % (ref 12–46)
Lymphs Abs: 2 10*3/uL (ref 0.7–4.0)
MCH: 29.4 pg (ref 26.0–34.0)
MCHC: 32.2 g/dL (ref 30.0–36.0)
MCV: 91.1 fL (ref 78.0–100.0)
Monocytes Absolute: 0.6 10*3/uL (ref 0.1–1.0)
Monocytes Relative: 10 % (ref 3–12)
Neutro Abs: 2.5 10*3/uL (ref 1.7–7.7)
Neutrophils Relative %: 46 % (ref 43–77)
Platelets: 153 10*3/uL (ref 150–400)
RBC: 3.95 MIL/uL (ref 3.87–5.11)
RDW: 14.9 % (ref 11.5–15.5)
WBC: 5.5 10*3/uL (ref 4.0–10.5)

## 2014-03-09 LAB — COMPREHENSIVE METABOLIC PANEL
ALT: 13 U/L (ref 0–35)
AST: 21 U/L (ref 0–37)
Albumin: 2.9 g/dL — ABNORMAL LOW (ref 3.5–5.2)
Alkaline Phosphatase: 60 U/L (ref 39–117)
BUN: 18 mg/dL (ref 6–23)
CO2: 27 mEq/L (ref 19–32)
Calcium: 9.7 mg/dL (ref 8.4–10.5)
Chloride: 107 mEq/L (ref 96–112)
Creatinine, Ser: 1.03 mg/dL (ref 0.50–1.10)
GFR calc Af Amer: 56 mL/min — ABNORMAL LOW (ref >90)
GFR calc non Af Amer: 48 mL/min — ABNORMAL LOW (ref >90)
Glucose, Bld: 82 mg/dL (ref 70–99)
Potassium: 4.2 mEq/L (ref 3.7–5.3)
Sodium: 145 mEq/L (ref 137–147)
Total Bilirubin: 0.4 mg/dL (ref 0.3–1.2)
Total Protein: 6.4 g/dL (ref 6.0–8.3)

## 2014-03-09 MED ORDER — TROLAMINE SALICYLATE 10 % EX CREA
TOPICAL_CREAM | Freq: Two times a day (BID) | CUTANEOUS | Status: DC | PRN
  Filled 2014-03-09: qty 85

## 2014-03-09 MED ORDER — MUSCLE RUB 10-15 % EX CREA
TOPICAL_CREAM | Freq: Two times a day (BID) | CUTANEOUS | Status: DC | PRN
  Administered 2014-03-14: 01:00:00 via TOPICAL
  Filled 2014-03-09: qty 85

## 2014-03-09 NOTE — Progress Notes (Signed)
Patient information reviewed and entered into eRehab system by Jodie Leiner, RN, CRRN, PPS Coordinator.  Information including medical coding and functional independence measure will be reviewed and updated through discharge.     Per nursing patient was given "Data Collection Information Summary for Patients in Inpatient Rehabilitation Facilities with attached "Privacy Act Statement-Health Care Records" upon admission.  

## 2014-03-09 NOTE — Progress Notes (Signed)
Social Work Assessment and Plan Social Work Assessment and Plan  Patient Details  Name: Shelia Marks MRN: [id number] Date of Birth: 1926/09/01  Today's Date: 03/09/2014  Problem List:  Patient Active Problem List   Diagnosis Date Noted  . 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 CVA (cerebral vascular accident) 03/05/2014  . CVA (cerebral infarction) 03/05/2014  . Hypothyroidism   . Hypertension   . Senile osteoporosis   . Hyperlipidemia LDL goal < 100   . Alzheimer's disease    Past Medical History:  Past Medical History  Diagnosis Date  . Hypothyroidism   . Cancer   . Hypertension   . Sleep related leg cramps   . Senile osteoporosis   . Malignant neoplasm of thyroid gland   . Nontoxic uninodular goiter   . Dizziness and giddiness   . Other symptoms involving cardiovascular system   . Alzheimer's disease   . Varicose veins of lower extremities with inflammation   . Disorder of bone and cartilage, unspecified   . Hyperlipidemia LDL goal < 100   . Unspecified disorder of lipoid metabolism   . Unspecified hereditary and idiopathic peripheral neuropathy   . Phlebitis and thrombophlebitis of unspecified site   . Other malaise and fatigue   . Stroke    Past Surgical History:  Past Surgical History  Procedure Laterality Date  . Abdominal hysterectomy      DR HUGES  . Knee surgery      right  . Colon surgery    . Thyroid surgery  July 20, 2011  . Parathyroid adenoma removed  1983  . Catract surgery  2008   Social History:  reports that she has never smoked. She has never used smokeless tobacco. She reports that she does not drink alcohol or use illicit drugs.  Family / Support Systems Marital Status: Widow/Widower How Long?: 1995 Patient Roles: Other (Comment) (Sister) Other Supports: Carita Pian  [phone]  [phone] Anticipated Caregiver: Sister Ability/Limitations of Caregiver: Sister can provide supervision and some light  min Caregiver Availability: 24/7 Family Dynamics: Close knit family pt had five sister's, three are still at living.  One sister lives locally and can assist.  Pt has nieces and nephews who see her every once in a while.  Pt report's her neighbor is very supportive.  Social History Preferred language: English Religion: Baptist Cultural Background: No issues Education: [employer] Read: Yes Write: Yes Employment Status: Retired Freight forwarder Issues: No issues Guardian/Conservator: None-according to MD pt is capable of making her own decisions while here   Abuse/Neglect Physical Abuse: Denies Verbal Abuse: Denies Sexual Abuse: Denies Exploitation of patient/patient's resources: Denies Self-Neglect: Denies  Emotional Status Pt's affect, behavior adn adjustment status: Pt is motivated to get independent again, she feels she is doing better and almost ready to go home.  Discussed trying to get mobile again and walking.  She is agreeable to this and will work on this here.  Pt is a little confused, could be dementia and moving floors yesterday.  Will give her a few days to adjust to rehab and see how she is doing. Recent Psychosocial Issues: other medical issues-managed by herself at home PTA Pyschiatric History: No history-deferred depression screen due to pt little confused and trying to prepare for therapies this am.  Will monitor her coping while here and intervene if necessary. Substance Abuse History: No issues  Patient / Family Perceptions, Expectations & Goals Pt/Family understanding of illness & functional limitations: Pt can explain her stroke  and what happened surrounding this.  Her sister helped her and had her come to the hospital when she was having difficulty.  Both are hopeful pt will do well here.  Sister will assist if necessary. Premorbid pt/family roles/activities: Sister, Elenor Legato, Retiree, [employer]. Chruch member, etc Anticipated changes in  roles/activities/participation: resume Pt/family expectations/goals: Pt states; " I just need to get a little better and then can go home."  Sister states: " I hope she does good here."  [employer]: None Premorbid Home Care/DME Agencies: None Transportation available at discharge: [employer] referrals recommended: Support group (specify) (CVA Support group)  Discharge Planning Living Arrangements: Alone Support Systems: Other relatives;Friends/neighbors;Church/faith community Type of Residence: Private residence Insurance Resources: Education officer, museum (specify) (Commerical Ins) Financial Resources: Radio broadcast assistant Screen Referred: No Living Expenses: Own Money Management: Patient Does the patient have any problems obtaining your medications?: No Home Management: Self Patient/Family Preliminary Plans: Return home with sister staying with her, will need to come up with a long term plan, since she may need this for long term.  She plans to give up driving and will rely upon her sister now.  Will have sister coem in and attend therapies with pt and hope some of her confusion clears. Social Work Anticipated Follow Up Needs: HH/OP;Support Group  Clinical Impression Pleasant female who is just a little confused but aware of the hospital, reason she is here and what she is working on.  She has a supportive sister who is willing to assist her, but limited supports. Will work on a safe discharge plan with pt and sister.  Elease Hashimoto 03/09/2014, 10:22 AM

## 2014-03-09 NOTE — Care Management Note (Signed)
[facility] Individual Statement of Services  Patient Name:  Shelia Marks  Date:  03/09/2014  Welcome to the [facility].  Our goal is to provide you with an individualized program based on your diagnosis and situation, designed to meet your specific needs.  With this comprehensive rehabilitation program, you will be expected to participate in at least 3 hours of rehabilitation therapies Monday-Friday, with modified therapy programming on the weekends.  Your rehabilitation program will include the following services:  Physical Therapy (PT), Occupational Therapy (OT), Speech Therapy (ST), 24 hour per day rehabilitation nursing, Therapeutic Recreaction (TR), Case Management (Social Worker), Rehabilitation Medicine, Nutrition Services and Pharmacy Services  Weekly team conferences will be held on Wednesday to discuss your progress.  Your Social Worker will talk with you frequently to get your input and to update you on team discussions.  Team conferences with you and your family in attendance may also be held.  Expected length of stay: 12-14 days Overall anticipated outcome: mod/i level  Depending on your progress and recovery, your program may change. Your Social Worker will coordinate services and will keep you informed of any changes. Your Social Worker's name and contact numbers are listed  below.  The following services may also be recommended but are not provided by the [facility] will be made to provide these services after discharge if needed.  Arrangements include referral to agencies that provide these services.  Your insurance has been verified to be:  Medicare & Commerical Supp Your primary doctor is:  Dr.Tiffany Reed  Pertinent information will be shared with your doctor and your insurance  company.  Social Worker:  Ovidio Kin, [place] or ([phone]  Information discussed with and copy given to patient by: Elease Hashimoto, 03/09/2014, 9:58 AM

## 2014-03-09 NOTE — Evaluation (Signed)
Occupational Therapy Assessment and Plan  Patient Details  Name: Shelia Marks MRN: [id number] Date of Birth: August 24, 1926  OT Diagnosis: hemiplegia affecting non-dominant side and muscle weakness (generalized) Rehab Potential: Rehab Potential: Good ELOS: 12-14 days   Today's Date: 03/09/2014 Time: 1002-1100 Time Calculation (min): 58 min  Problem List:  Patient Active Problem List   Diagnosis Date Noted  . 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 CVA (cerebral vascular accident) 03/05/2014  . CVA (cerebral infarction) 03/05/2014  . Hypothyroidism   . Hypertension   . Senile osteoporosis   . Hyperlipidemia LDL goal < 100   . Alzheimer's disease     Past Medical History:  Past Medical History  Diagnosis Date  . Hypothyroidism   . Cancer   . Hypertension   . Sleep related leg cramps   . Senile osteoporosis   . Malignant neoplasm of thyroid gland   . Nontoxic uninodular goiter   . Dizziness and giddiness   . Other symptoms involving cardiovascular system   . Alzheimer's disease   . Varicose veins of lower extremities with inflammation   . Disorder of bone and cartilage, unspecified   . Hyperlipidemia LDL goal < 100   . Unspecified disorder of lipoid metabolism   . Unspecified hereditary and idiopathic peripheral neuropathy   . Phlebitis and thrombophlebitis of unspecified site   . Other malaise and fatigue   . Stroke    Past Surgical History:  Past Surgical History  Procedure Laterality Date  . Abdominal hysterectomy      DR HUGES  . Knee surgery      right  . Colon surgery    . Thyroid surgery  July 20, 2011  . Parathyroid adenoma removed  1983  . Catract surgery  2008    Assessment & Plan Clinical Impression: Patient is a 80 y.o. right-handed female with history of hypertension, hyperlipidemia. Admitted 03/05/2014 with slurred speech and left-sided weakness. Patient lives alone and used a cane prior to admission. MRI of the brain showed a 6 mm acute  right pontine infarct. MRA of the head and neck with no major occlusion or stenosis. Carotid Dopplers with no ICA stenosis. Echocardiogram with ejection fraction of 75% grade 1 diastolic dysfunction. Patient did not receive TPA. Neurology services consulted placed on aspirin for CVA prophylaxis as well as subcutaneous Lovenox for DVT prophylaxis. Tolerating a mechanical soft diet with thin liquids. Maintained on intravenous Rocephin presently with urinalysis study showing 55,000 multiple bacterial. Patient transferred to [place] on 03/08/2014 .    Patient currently requires min-mod assist with basic self-care skills secondary to muscle weakness, decreased cardiorespiratoy endurance, decreased attention and decreased standing balance, decreased postural control and decreased balance strategies.  Prior to hospitalization, patient could complete BADLs with independent-modified independent.  Patient will benefit from skilled intervention to increase independence with basic self-care skills prior to discharge home independently.  Anticipate patient will require intermittent supervision and follow up home health.  OT - End of Session Activity Tolerance: Decreased this session Endurance Deficit: Yes OT Assessment Rehab Potential: Good Barriers to Discharge: Decreased caregiver support OT Patient demonstrates impairments in the following area(s): Balance;Cognition;Endurance;Motor;Safety;Vision OT Basic ADL's Functional Problem(s): Grooming;Bathing;Dressing;Toileting;Eating OT Advanced ADL's Functional Problem(s): Laundry OT Transfers Functional Problem(s): Toilet;Tub/Shower OT Additional Impairment(s): Fuctional Use of Upper Extremity OT Plan OT Intensity: Minimum of 1-2 x/day, 45 to 90 minutes OT Frequency: 5 out of 7 days OT Duration/Estimated Length of Stay: 12-14 days OT Treatment/Interventions: Balance/vestibular training;Cognitive remediation/compensation;Discharge planning;DME/adaptive equipment  instruction;Neuromuscular re-education;Functional mobility  training;Psychosocial support;Self Care/advanced ADL retraining;Therapeutic Activities;Therapeutic Exercise;UE/LE Strength taining/ROM;UE/LE Coordination activities;Visual/perceptual remediation/compensation;Patient/family education OT Self Feeding Anticipated Outcome(s): Mod I OT Basic Self-Care Anticipated Outcome(s): Mod I OT Toileting Anticipated Outcome(s): Mod I  OT Bathroom Transfers Anticipated Outcome(s): Mod I OT Recommendation Patient destination: Home Follow Up Recommendations: Home health OT Equipment Recommended: Tub/shower bench Equipment Details: pt reporting she only sponge bathes and is unsure if she wants to shower   Skilled Therapeutic Intervention Pt seen for ADL retraining with focus on activity tolerance, functional transfers, sit<>stand, and standing balance. Pt received sitting in recliner chair. Completed stand pivot transfer to w/c with min assist. Engaged in sponge bath at sink. Pt reporting only sponge bathing at home and declined learning tub transfer with TTB. Completed sit<>stand at sink x3 with min assist. Pt utilizing BUE for bathing with no noticeable problem with grip strength in L hand. Pt required mod assist for LB dressing for threading. Attempted to use crossover technique however unsuccessful. Pt with c/o dizziness throughout session and attempted to utilize visual targeting and gaze stabilization techniques however pts sustained attention impacting success with this. Pt required min cues to re-direct to self-care tasks during session and difficult to determine if she was distracted by dizziness or cognitive deficit. At end of session pt left sitting in w/c with all needs in reach. Discussed home management tasks at home and pt reported wanting to get meals on wheels and will only need to complete laundry.  OT Evaluation Precautions/Restrictions  Precautions Precautions: Fall Precaution Comments:  left sided weakness Restrictions Weight Bearing Restrictions: No General   Vital Signs   Pain Pain Assessment Pain Assessment: No/denies pain Pain Score: 0-No pain Home Living/Prior Functioning Home Living Living Arrangements: Alone Available Help at Discharge: Available PRN/intermittently;Family Type of Home: House Home Access: Stairs to enter [employer] of Steps: 2 Entrance Stairs-Rails: Right Home Layout: One level Additional Comments: Pt owns personal single point cane, quad cane. Patient reports only sponge bathing  Lives With: Alone IADL History Current License: Yes (does not plan on driving) Prior Function Level of Independence: Independent with basic ADLs;Requires assistive device for independence;Independent with homemaking with ambulation  Able to Take Stairs?: Yes Driving: Yes Vocation: Retired Leisure: Hobbies-no ADL   Vision/Perception  Vision - History Baseline Vision: Wears glasses only for reading Visual History: Cataracts;Corrective eye surgery Patient Visual Report: Blurring of vision;No change from baseline Vision - Assessment Eye Alignment: Within Functional Limits Vision Assessment: Vision impaired - to be further tested in functional context Additional Comments: poor saccades, unable to maintain visual fixation to L Perception Perception: Within Functional Limits Praxis Praxis: Intact  Cognition Overall Cognitive Status: Within Functional Limits for tasks assessed (sister assisted at baseline with some things) Arousal/Alertness: Awake/alert Orientation Level: Oriented X4 Attention: Sustained Sustained Attention: Impaired Sustained Attention Impairment: Functional complex Memory: Impaired Memory Impairment: Other (comment) (pt asks for repeats) Problem Solving: Appears intact Problem Solving Impairment: Functional complex Safety/Judgment: Appears intact Sensation Sensation Light Touch: Appears Intact Proprioception: Appears  Intact Coordination Gross Motor Movements are Fluid and Coordinated: No Fine Motor Movements are Fluid and Coordinated: No Finger Nose Finger Test: decreased speed Heel Shin Test: Unable to formally assess secondary to weakness on LLE Motor  Motor Motor: Ataxia Mobility     Trunk/Postural Assessment     Balance   Extremity/Trunk Assessment RUE Assessment RUE Assessment: Within Functional Limits (decresaed ROM PTA d/t rotator cuff injury) LUE Assessment LUE Assessment: Exceptions to [facility] (shoulder 3+/5; elbow 3/5, decreased grip strength)  FIM:  FIM - Eating Eating Activity: 5: Set-up assist for open containers FIM - Grooming Grooming Steps: Wash, rinse, dry face;Wash, rinse, dry hands;Brush, comb hair Grooming: 5: Set-up assist to obtain items FIM - Bathing Bathing Steps Patient Completed: Chest;Right Arm;Left Arm;Abdomen;Left upper leg;Right upper leg;Buttocks Bathing: 3: Mod-Patient completes 5-7 5f10 parts or 50-74% FIM - Upper Body Dressing/Undressing Upper body dressing/undressing steps patient completed: Thread/unthread right sleeve of pullover shirt/dresss;Thread/unthread left sleeve of pullover shirt/dress;Pull shirt over trunk Upper body dressing/undressing: 4: Min-Patient completed 75 plus % of tasks FIM - Lower Body Dressing/Undressing Lower body dressing/undressing steps patient completed: Thread/unthread left underwear leg;Thread/unthread right underwear leg;Pull underwear up/down Lower body dressing/undressing: 3: Mod-Patient completed 50-74% of tasks FIM - BControl and instrumentation engineerDevices: Bed rails Bed/Chair Transfer: 4: Sit > Supine: Min A (steadying pt. > 75%/lift 1 leg);3: Supine > Sit: Mod A (lifting assist/Pt. 50-74%/lift 2 legs;3: Bed > Chair or W/C: Mod A (lift or lower assist);3: Chair or W/C > Bed: Mod A (lift or lower assist) FIM - TRadio producerDevices: Grab bars Toilet Transfers: 4-From  toilet/BSC: Min A (steadying Pt. > 75%);4-To toilet/BSC: Min A (steadying Pt. > 75%)   Refer to Care Plan for Long Term Goals  Recommendations for other services: None  Discharge Criteria: Patient will be discharged from OT if patient refuses treatment 3 consecutive times without medical reason, if treatment goals not met, if there is a change in medical status, if patient makes no progress towards goals or if patient is discharged from hospital.  The above assessment, treatment plan, treatment alternatives and goals were discussed and mutually agreed upon: by patient  , KQuillian Quince3/09/2014, 1:12 PM

## 2014-03-09 NOTE — Evaluation (Signed)
Physical Therapy Assessment and Plan  Patient Details  Name: Shelia Marks MRN: [id number] Date of Birth: 01-17-1926  PT Diagnosis: Abnormal posture, Abnormality of gait, Hemiplegia non-dominant, Muscle weakness, Pain in left aspect of neck and Vertigo of central origin Rehab Potential: Good ELOS: 12-14 days   Today's Date: 03/09/2014 Time: 1100-1200 and [id number] Time Calculation (min): 60 min and 41 min  Problem List:  Patient Active Problem List   Diagnosis Date Noted  . 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 CVA (cerebral vascular accident) 03/05/2014  . CVA (cerebral infarction) 03/05/2014  . Hypothyroidism   . Hypertension   . Senile osteoporosis   . Hyperlipidemia LDL goal < 100   . Alzheimer's disease     Past Medical History:  Past Medical History  Diagnosis Date  . Hypothyroidism   . Cancer   . Hypertension   . Sleep related leg cramps   . Senile osteoporosis   . Malignant neoplasm of thyroid gland   . Nontoxic uninodular goiter   . Dizziness and giddiness   . Other symptoms involving cardiovascular system   . Alzheimer's disease   . Varicose veins of lower extremities with inflammation   . Disorder of bone and cartilage, unspecified   . Hyperlipidemia LDL goal < 100   . Unspecified disorder of lipoid metabolism   . Unspecified hereditary and idiopathic peripheral neuropathy   . Phlebitis and thrombophlebitis of unspecified site   . Other malaise and fatigue   . Stroke    Past Surgical History:  Past Surgical History  Procedure Laterality Date  . Abdominal hysterectomy      DR HUGES  . Knee surgery      right  . Colon surgery    . Thyroid surgery  July 20, 2011  . Parathyroid adenoma removed  1983  . Catract surgery  2008    Assessment & Plan Clinical Impression: Shelia Marks is a 80 y.o. right-handed female with history of hypertension, hyperlipidemia. Admitted 03/05/2014 with slurred speech and left-sided weakness. Patient lives  alone and used a cane prior to admission. MRI of the brain showed a 6 mm acute right pontine infarct. MRA of the head and neck with no major occlusion or stenosis. Carotid Dopplers with no ICA stenosis. Echocardiogram with ejection fraction of 08% grade 1 diastolic dysfunction. Patient did not receive TPA. Neurology services consulted placed on aspirin for CVA prophylaxis as well as subcutaneous Lovenox for DVT prophylaxis. Tolerating a mechanical soft diet with thin liquids. Maintained on intravenous Rocephin presently with urinalysis study showing 55,000 multiple bacterial. Physical therapy evaluation completed 03/06/2014 with recommendations for physical medicine rehabilitation consult. Patient was admitted for comprehensive rehabilitation program. Patient transferred to CIR on 03/08/2014 .   Patient currently requires min-mod with mobility secondary to muscle weakness, decreased cardiorespiratoy endurance, impaired timing and sequencing and unbalanced muscle activation, central origin and decreased standing balance, decreased postural control, hemiplegia and decreased balance strategies.  Prior to hospitalization, patient was modified independent  with mobility and lived with Alone in a House home.  Home access is 2Stairs to enter.  Patient will benefit from skilled PT intervention to maximize safe functional mobility and minimize fall risk for planned discharge home with intermittent supervision.  Anticipate patient will benefit from follow up OP at discharge.  PT - End of Session Activity Tolerance: Tolerates 30+ min activity with multiple rests Endurance Deficit: Yes PT Assessment Rehab Potential: Good Barriers to Discharge: Decreased caregiver support;Other (comment) (Pt lived alone PTA.) PT Patient demonstrates  impairments in the following area(s): Balance;Edema;Endurance;Motor;Pain;Safety PT Transfers Functional Problem(s): Bed Mobility;Bed to Chair;Car;Furniture;Floor PT Locomotion Functional  Problem(s): Ambulation;Wheelchair Mobility;Stairs PT Plan PT Intensity: Minimum of 1-2 x/day ,45 to 90 minutes PT Frequency: 5 out of 7 days PT Duration Estimated Length of Stay: 12-14 days PT Treatment/Interventions: Ambulation/gait training;Balance/vestibular training;Disease management/prevention;Discharge planning;Functional electrical stimulation;Functional mobility training;Patient/family education;Pain management;Neuromuscular re-education;DME/adaptive equipment instruction;Splinting/orthotics;Therapeutic Exercise;Therapeutic Activities;Stair training;UE/LE Strength taining/ROM;UE/LE Coordination activities;Visual/perceptual remediation/compensation;Wheelchair propulsion/positioning PT Transfers Anticipated Outcome(s): Mod I PT Locomotion Anticipated Outcome(s): Supervision to Mod I PT Recommendation Recommendations for Other Services: Vestibular eval Follow Up Recommendations: Outpatient PT Patient destination: Home Equipment Recommended: To be determined Equipment Details: Pt owns standard cane and quad cane.  Skilled Therapeutic Intervention Treatment Session 1: PT evaluation performed. See below for detailed findings. Treatment initiated. Pt oriented x4 with min cueing to utilize visual aid to orient self to date. Session focused on initiating gait training, stair negotiation, and orienting pt to w/c propulsion, management of w/c parts. Therapist provided manual facilitation for proper UE w/c propulsion technique with focus on increased excursion.   Pt with increased dizziness with supine>sit, sit>stand transfers; no significant change in BP. No gaze-evoked nystagmus noted. Smooth pursuit appears abnormal with horizontal tracking. Pt with decreased symptoms with cueing for visual targeting. Will recommend vestibular evaluation.  Performed gait x12' in controlled environment with rolling walker and mod A, manual facilitation of weight shift to L side to promote increased RLE step  length). Visual target (letter "A") taped to rolling walker to control dizziness during gait. Negotiation of 3 steps with step-to pattern with +2A; forward-facing with bilat rails to ascend, sideways with bilat UE support at L rail to descend. Manual facilitation of weight shifting; verbal cueing for sequencing/technique. Second therapist provided steadying assist for safety. Session ended in pt room, where pt was left seated in bedside chair with all needs within reach.  Treatment Session 2: Pt received semi-reclined in bed; asleep but easily aroused. Pt agreeable to session. Supine>sit with min A to lift trunk, HOB flat, using bed rail with mod multimodal cueing for technique (initiation of RUE movement across midline, R knee flexion) sequencing. Session focused on controlling L neck pain and improving pt stability/independence with w/c mobility and functional transfers. Pt reports pain in L aspect of neck (area of anterior/middle scalenes). Notified RN, who reports pain may be secondary to L hemiplegia. Donned GivMohr sling at LUE.  Performed w/c mobility x60' in controlled environment with RUE and bilat LE's with min A, manual facilitation of LLE technique, tactile cueing at distal L quadriceps to promote L knee extension and at L hamstrings to promote flexion. Returned to room, per pt request to use bathroom. Performed stand pivot from w/c<>toilet with min A using grab bars. Pt with incontinent episode due to inability to get to bathroom quickly enough; RN notified. Dynamic sitting balance with SBA (while donning clean brief, pants) with intermittent UE support at grab bars. Pt performed sit<>stand x3 for performance of self hygiene and to pull up pants; mod A for dynamic standing balance without UE support. Therapist departed with pt seated in bedside chair with all needs within reach.   PT Evaluation Precautions/Restrictions Precautions Precautions: Fall Precaution Comments: left sided  weakness Restrictions Weight Bearing Restrictions: No General   Vital SignsTherapy Vitals Temp: 98.4 F (36.9 C) Temp src: Oral Pulse Rate: 77 Resp: 14 BP: 128/78 mmHg Patient Position, if appropriate: Lying Oxygen Therapy SpO2: 98 % O2 Device: None (Room air) Pain Pain Assessment Pain Assessment: 0-10  Pain Score: 6  Pain Type: Acute pain Pain Location: Neck Pain Orientation: Left Pain Descriptors / Indicators: Aching Pain Onset: Gradual Pain Intervention(s): RN made aware;Splinting;Rest (Donned L GivMohr sling) Multiple Pain Sites: No Home Living/Prior Functioning Home Living Available Help at Discharge: Available PRN/intermittently;Family Type of Home: House Home Access: Stairs to enter [employer] of Steps: 2 Entrance Stairs-Rails: Right Home Layout: One level Additional Comments: Pt owns personal single point cane, quad cane. Patient reports only sponge bathing  Lives With: Alone Prior Function Level of Independence: Independent with basic ADLs;Requires assistive device for independence;Independent with homemaking with ambulation  Able to Take Stairs?: Yes Driving: Yes Vocation: Retired Leisure: Hobbies-no Vision/Perception  Vision - History Baseline Vision: Wears glasses only for reading Visual History: Cataracts;Corrective eye surgery Patient Visual Report: Blurring of vision;No change from baseline Vision - Assessment Eye Alignment: Within Functional Limits Vision Assessment: Vision impaired - to be further tested in functional context Additional Comments: poor saccades, unable to maintain visual fixation to L Perception Perception: Within Functional Limits Praxis Praxis: Intact  Cognition Overall Cognitive Status: Within Functional Limits for tasks assessed (sister assisted at baseline with some things) Arousal/Alertness: Awake/alert Orientation Level: Oriented X4 Attention: Sustained Sustained Attention: Impaired Sustained Attention  Impairment: Functional complex Memory: Impaired Memory Impairment: Other (comment) (pt asks for repeats) Problem Solving: Appears intact Safety/Judgment: Appears intact Sensation Sensation Light Touch: Appears Intact Coordination Gross Motor Movements are Fluid and Coordinated: No Fine Motor Movements are Fluid and Coordinated: No Finger Nose Finger Test: decreased speed Motor  Motor Motor: Hemiplegia Motor - Skilled Clinical Observations: L hemiplegia  Mobility Bed Mobility Bed Mobility: Supine to Sit;Sitting - Scoot to Edge of Bed;Sit to Supine Supine to Sit: 3: Mod assist;HOB flat Supine to Sit Details: Verbal cues for precautions/safety;Verbal cues for technique;Tactile cues for weight shifting;Tactile cues for sequencing;Tactile cues for placement Supine to Sit Details (indicate cue type and reason): Mod A to lift trunk Sitting - Scoot to Edge of Bed: 3: Mod assist Sitting - Scoot to Edge of Bed Details: Tactile cues for weight shifting;Visual cues/gestures for sequencing;Tactile cues for sequencing;Verbal cues for technique;Manual facilitation for weight bearing Sitting - Scoot to Edge of Bed Details (indicate cue type and reason): Explained/demonstrated technique, then manuallly facilitated weight shifting while pt performed scooting Sit to Supine: 4: Min assist Sit to Supine - Details: Verbal cues for precautions/safety;Tactile cues for sequencing Sit to Supine - Details (indicate cue type and reason): Min A for LLE management Transfers Transfers: Yes Sit to Stand: 3: Mod assist;From chair/3-in-1;With armrests;With upper extremity assist Sit to Stand Details: Manual facilitation for weight shifting;Verbal cues for safe use of DME/AE;Tactile cues for weight beaing Sit to Stand Details (indicate cue type and reason): Tactile cueing at L knee to promote LLE weightbearing. Stand to Sit: 3: Mod assist;With armrests;To chair/3-in-1 Stand to Sit Details (indicate cue type and  reason): Manual facilitation for weight shifting;Verbal cues for safe use of DME/AE;Tactile cues for weight beaing Stand Pivot Transfers: 3: Mod assist;With armrests Stand Pivot Transfer Details: Manual facilitation for weight shifting;Verbal cues for precautions/safety Squat Pivot Transfers: 3: Mod assist Squat Pivot Transfer Details: Verbal cues for technique;Tactile cues for sequencing;Manual facilitation for weight shifting Locomotion  Ambulation Ambulation/Gait Assistance: 3: Mod assist Wheelchair Mobility Distance: 60  Trunk/Postural Assessment  Cervical Assessment Cervical Assessment: Within Functional Limits Thoracic Assessment Thoracic Assessment: Within Functional Limits Lumbar Assessment Lumbar Assessment: Within Functional Limits Postural Control Postural Control: Deficits on evaluation Postural Limitations: In sitting, majority of weight  on R hip; lateral trunk lean to R.  Balance Balance Balance Assessed: Yes Static Sitting Balance Static Sitting - Balance Support: Feet supported;Left upper extremity supported Static Sitting - Level of Assistance: 6: Modified independent (Device/Increase time) Static Sitting - Comment/# of Minutes: Seated EOB >5 minutes with LUE support at bed rail. Dynamic Sitting Balance Dynamic Sitting - Balance Support: No upper extremity supported;During functional activity;Feet supported Dynamic Sitting - Level of Assistance: 5: Stand by assistance Dynamic Sitting - Balance Activities: Lateral lean/weight shifting;Reaching for objects;Reaching across midline Sitting balance - Comments: Dynamic sitting on toilet with RUE reaching across midline Static Standing Balance Static Standing - Balance Support: Right upper extremity supported;During functional activity Static Standing - Level of Assistance: 4: Min assist Static Standing - Comment/# of Minutes: >30 seconds with RUE support at grab bar; min A for stability/balance Dynamic Standing  Balance Dynamic Standing - Balance Support: During functional activity;No upper extremity supported Dynamic Standing - Level of Assistance: 3: Mod assist Dynamic Standing - Balance Activities: Reaching for objects;Forward lean/weight shifting Dynamic Standing - Comments: Standing, pulling up pants after toileting x 30 seconds with mod A for stability/balance. Extremity Assessment  RUE Assessment RUE Assessment: Within Functional Limits (decresaed ROM PTA d/t rotator cuff injury) LUE Assessment LUE Assessment: Exceptions to [facility] (shoulder 3+/5; elbow 3/5, decreased grip strength) RLE Assessment RLE Assessment: Within Functional Limits LLE Assessment LLE Assessment: Exceptions to [facility] LLE Strength Left Hip Flexion: 2+/5 Left Knee Flexion: 3/5 Left Knee Extension: 3+/5 Left Ankle Dorsiflexion: 3-/5 Left Ankle Plantar Flexion: 3+/5  FIM:  FIM - Bed/Chair Transfer Bed/Chair Transfer Assistive Devices: Bed rails Bed/Chair Transfer: 3: Bed > Chair or W/C: Mod A (lift or lower assist);3: Chair or W/C > Bed: Mod A (lift or lower assist);4: Supine > Sit: Min A (steadying Pt. > 75%/lift 1 leg) FIM - Locomotion: Wheelchair Distance: 60 Locomotion: Wheelchair: 2: Travels 50 - 149 ft with minimal assistance (Pt.>75%) FIM - Locomotion: Ambulation Locomotion: Ambulation Assistive Devices: Administrator Ambulation/Gait Assistance: 3: Mod assist Locomotion: Ambulation: 1: Travels less than 50 ft with moderate assistance (Pt: 50 - 74%) FIM - Locomotion: Stairs Locomotion: Scientist, physiological: Insurance account manager - 2 Locomotion: Stairs: 1: Two helpers   Refer to [employer] for Long Term Goals  Recommendations for other services: Other: Vestibular evaluation  Discharge Criteria: Patient will be discharged from PT if patient refuses treatment 3 consecutive times without medical reason, if treatment goals not met, if there is a change in medical status, if patient makes no progress towards goals or if  patient is discharged from hospital.  The above assessment, treatment plan, treatment alternatives and goals were discussed and mutually agreed upon: by patient  Stefano Gaul 03/09/2014, 4:35 PM

## 2014-03-09 NOTE — Progress Notes (Signed)
Subjective/Complaints: Slept ok last noc No bowel or bladder issues except some urgency Review of Systems - Negative except Left side weak Objective: Vital Signs: Blood pressure 166/73, pulse 71, temperature 98.2 F (36.8 C), temperature source Oral, resp. rate 18, weight 76.431 kg (168 lb 8 oz), SpO2 98.00%. No results found. Results for orders placed during the hospital encounter of 03/08/14 (from the past 72 hour(s))  CBC     Status: Abnormal   Collection Time    03/08/14  8:05 PM      Result Value Ref Range   WBC 5.3  4.0 - 10.5 K/uL   RBC 3.84 (*) 3.87 - 5.11 MIL/uL   Hemoglobin 11.4 (*) 12.0 - 15.0 g/dL   HCT 34.9 (*) 36.0 - 46.0 %   MCV 90.9  78.0 - 100.0 fL   MCH 29.7  26.0 - 34.0 pg   MCHC 32.7  30.0 - 36.0 g/dL   RDW 15.0  11.5 - 15.5 %   Platelets 164  150 - 400 K/uL  CREATININE, SERUM     Status: Abnormal   Collection Time    03/08/14  8:05 PM      Result Value Ref Range   Creatinine, Ser 0.98  0.50 - 1.10 mg/dL   GFR calc non Af Amer 51 (*) >90 mL/min   GFR calc Af Amer 59 (*) >90 mL/min   Comment: (NOTE)     The eGFR has been calculated using the CKD EPI equation.     This calculation has not been validated in all clinical situations.     eGFR's persistently <90 mL/min signify possible Chronic Kidney     Disease.  CBC WITH DIFFERENTIAL     Status: Abnormal   Collection Time    03/09/14  5:30 AM      Result Value Ref Range   WBC 5.5  4.0 - 10.5 K/uL   RBC 3.95  3.87 - 5.11 MIL/uL   Hemoglobin 11.6 (*) 12.0 - 15.0 g/dL   HCT 36.0  36.0 - 46.0 %   MCV 91.1  78.0 - 100.0 fL   MCH 29.4  26.0 - 34.0 pg   MCHC 32.2  30.0 - 36.0 g/dL   RDW 14.9  11.5 - 15.5 %   Platelets 153  150 - 400 K/uL   Neutrophils Relative % 46  43 - 77 %   Neutro Abs 2.5  1.7 - 7.7 K/uL   Lymphocytes Relative 37  12 - 46 %   Lymphs Abs 2.0  0.7 - 4.0 K/uL   Monocytes Relative 10  3 - 12 %   Monocytes Absolute 0.6  0.1 - 1.0 K/uL   Eosinophils Relative 6 (*) 0 - 5 %   Eosinophils  Absolute 0.3  0.0 - 0.7 K/uL   Basophils Relative 0  0 - 1 %   Basophils Absolute 0.0  0.0 - 0.1 K/uL  COMPREHENSIVE METABOLIC PANEL     Status: Abnormal   Collection Time    03/09/14  5:30 AM      Result Value Ref Range   Sodium 145  137 - 147 mEq/L   Potassium 4.2  3.7 - 5.3 mEq/L   Chloride 107  96 - 112 mEq/L   CO2 27  19 - 32 mEq/L   Glucose, Bld 82  70 - 99 mg/dL   BUN 18  6 - 23 mg/dL   Creatinine, Ser 1.03  0.50 - 1.10 mg/dL   Calcium 9.7  8.4 -  10.5 mg/dL   Total Protein 6.4  6.0 - 8.3 g/dL   Albumin 2.9 (*) 3.5 - 5.2 g/dL   AST 21  0 - 37 U/L   ALT 13  0 - 35 U/L   Alkaline Phosphatase 60  39 - 117 U/L   Total Bilirubin 0.4  0.3 - 1.2 mg/dL   GFR calc non Af Amer 48 (*) >90 mL/min   GFR calc Af Amer 56 (*) >90 mL/min   Comment: (NOTE)     The eGFR has been calculated using the CKD EPI equation.     This calculation has not been validated in all clinical situations.     eGFR's persistently <90 mL/min signify possible Chronic Kidney     Disease.     HEENT: normal and poor dentition Cardio: RRR and no murmur Resp: CTA B/L and unlabored GI: BS positive and non distended Extremity:  Pulses positive and No Edema Skin:   Intact Neuro: Alert/Oriented, Cranial Nerve II-XII normal, Normal Sensory and Abnormal Motor 5/5 RUE, 3-/5 LUE, 2-/5 B HF, Left Quad, TA/Gastroc, 4-/5 Distal RLE Musc/Skel:  Other absent L patella Gen NAD   Assessment/Plan: 1. Functional deficits secondary to Right Pontine infarct which require 3+ hours per day of interdisciplinary therapy in a comprehensive inpatient rehab setting. Physiatrist is providing close team supervision and 24 hour management of active medical problems listed below. Physiatrist and rehab team continue to assess barriers to discharge/monitor patient progress toward functional and medical goals. FIM:                   Comprehension Comprehension Mode: Auditory Comprehension: 5-Follows basic  conversation/direction: With extra time/assistive device  Expression Expression Mode: Verbal Expression: 5-Expresses basic needs/ideas: With extra time/assistive device  Social Interaction Social Interaction: 4-Interacts appropriately 75 - 89% of the time - Needs redirection for appropriate language or to initiate interaction.  Problem Solving Problem Solving: 4-Solves basic 75 - 89% of the time/requires cueing 10 - 24% of the time  Memory Memory: 6-More than reasonable amt of time  Medical Problem List and Plan:  1. Thrombotic right pontine infarct  2. DVT Prophylaxis/Anticoagulation: Subcutaneous Lovenox. Monitor platelet counts and any signs of bleeding  3. Pain Management/osteoarthritis: Hydrocodone as needed. Monitor with increased mobility.  4. Neuropsych: This patient is capable of making decisions on her own behalf.  5. Dysphagia. Dysphagia 3 and thin liquids /followup speech therapy  6. Hypertension. Norvasc 10 mg daily, Tenormin 25 mg daily, lisinopril 5 mg daily. Monitor with increased mobility  7. Hypothyroidism. Synthroid. Latest TSH level I.686  8. Hyperlipidemia. Lipitor  LOS (Days) 1 A FACE TO FACE EVALUATION WAS PERFORMED  KIRSTEINS,ANDREW E 03/09/2014, 7:03 AM

## 2014-03-09 NOTE — Evaluation (Signed)
Speech Language Pathology Assessment and Plan  Patient Details  Name: Shelia Marks MRN: [id number] Date of Birth: April 03, 1926  SLP Diagnosis:  none Rehab Potential:  (defer to OT/PT) ELOS:  (defer to OT/PT)  Today's Date: 03/09/2014 Time: 0832-0928 Time Calculation (min): 56 min  Problem List:  Patient Active Problem List   Diagnosis Date Noted  . 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 CVA (cerebral vascular accident) 03/05/2014  . CVA (cerebral infarction) 03/05/2014  . Hypothyroidism   . Hypertension   . Senile osteoporosis   . Hyperlipidemia LDL goal < 100   . Alzheimer's disease    Past Medical History:  Past Medical History  Diagnosis Date  . Hypothyroidism   . Cancer   . Hypertension   . Sleep related leg cramps   . Senile osteoporosis   . Malignant neoplasm of thyroid gland   . Nontoxic uninodular goiter   . Dizziness and giddiness   . Other symptoms involving cardiovascular system   . Alzheimer's disease   . Varicose veins of lower extremities with inflammation   . Disorder of bone and cartilage, unspecified   . Hyperlipidemia LDL goal < 100   . Unspecified disorder of lipoid metabolism   . Unspecified hereditary and idiopathic peripheral neuropathy   . Phlebitis and thrombophlebitis of unspecified site   . Other malaise and fatigue   . Stroke    Past Surgical History:  Past Surgical History  Procedure Laterality Date  . Abdominal hysterectomy      DR HUGES  . Knee surgery      right  . Colon surgery    . Thyroid surgery  July 20, 2011  . Parathyroid adenoma removed  1983  . Catract surgery  2008    Assessment / Plan / Recommendation Clinical Impression Shelia Marks is an 80 y.o. right-handed female with history of hypertension and hyperlipidemia. Admitted 03/05/2014 with slurred speech and left-sided weakness. Patient lives alone and used a cane prior to admission. MRI of the brain showed a 6 mm acute right pontine infarct. MRA of  the head and neck with no major occlusion or stenosis. Carotid Dopplers with no ICA stenosis. Echocardiogram with ejection fraction of 62% grade 1 diastolic dysfunction. Neurology services consulted placed on aspirin for CVA prophylaxis as well as subcutaneous Lovenox for DVT prophylaxis. Patient tolerating a mechanical soft diet with thin liquids due to dentition. Maintained on intravenous Rocephin presently with urinalysis study showing 55,000 multiple bacterial. Physical therapy evaluation completed 03/06/2014 with recommendations for physical medicine rehabilitation consult. Patient was admitted for comprehensive rehabilitation program 03/09/14.  Orders received and speech-language evaluation completed.  Patient with occasional decreased recall of new information; however, patient aware and requests repeats as needed.  Overall safety awareness good with accounting for new deficits post CVA.  Patient demonstrates [facility] cognitive-linguistic abilities that are at her baseline level of functioning.  Patient reports sister managed finances PTA and consumed soft solids due to poor detention and pain.  While oral motor exam was [facility] SLP educated patient regarding speech intelligibly strategies due to imprecise consonant production x1 during session.  All education completed and no further skilled SLP services are warranted at this time.   Skilled Therapeutic Interventions          SLP educated patient regarding effective speech intelligibility strategies, following patient utilized over articualtion with Mod I.   SLP Assessment  Patient does not need any further Speech Lanaguage Pathology Services    Recommendations  Oral Care Recommendations: Oral  care BID Patient destination: Home Follow up Recommendations: None Equipment Recommended: None recommended by SLP               Pain Pain Assessment Pain Assessment: No/denies pain Prior Functioning Cognitive/Linguistic Baseline: Within functional limits  (sister assisted with financial managment ) Type of Home: House  Lives With: Alone Available Help at Discharge: Family Vocation: Retired  See FIM for current functional status  Recommendations for other services: None  Discharge Criteria: Patient will be discharged from SLP if patient refuses treatment 3 consecutive times without medical reason, if treatment goals not met, if there is a change in medical status, if patient makes no progress towards goals or if patient is discharged from hospital.  The above assessment, treatment plan, treatment alternatives and goals were discussed and mutually agreed upon: by patient  Carmelia Roller., CCC-SLP [phone]  [place] 03/09/2014, 9:53 AM

## 2014-03-10 ENCOUNTER — Inpatient Hospital Stay (HOSPITAL_COMMUNITY): Payer: Medicare Other | Admitting: Physical Therapy

## 2014-03-10 ENCOUNTER — Inpatient Hospital Stay (HOSPITAL_COMMUNITY): Payer: Medicare Other

## 2014-03-10 MED ORDER — SENNOSIDES-DOCUSATE SODIUM 8.6-50 MG PO TABS
1.0000 | ORAL_TABLET | Freq: Two times a day (BID) | ORAL | Status: DC
  Administered 2014-03-10 – 2014-03-17 (×15): 1 via ORAL
  Filled 2014-03-10 (×21): qty 1

## 2014-03-10 NOTE — Progress Notes (Signed)
Physical Therapy Note  Patient Details  Name: Shelia Marks MRN: [id number] Date of Birth: 1926/11/05 Today's Date: 03/10/2014  Time: 815-855 40 minutes  1:1 no c/o pain.  Pt c/o dizziness only when bending over, resolves with upright position.  Gait training with RW 2 x 30' with min/mod facilitation for wt shifts, tactile and verbal cues for posture, L knee hyperextends during gait, requires tactile cues to prevent.  Standing NMR with bend and reach task for wt shifting and knee eccentric control with min/mod A, mod A when fatigued.  Alternating stepping with mod A for wt shifts and control on L.   DONAWERTH,KAREN 03/10/2014, 8:56 AM

## 2014-03-10 NOTE — Progress Notes (Signed)
Social Work Patient ID: Shelia Marks, female   DOB: 11/15/1926, 80 y.o.   MRN: [id number] Met with pt and sister to inform of team conference goals-supervision/mod/i level and discharge date 3/24.  Sister can assist some but asking lots of questions regarding available help at home. Informed what medicare covers and the need for someone to be with pt at discharge.  She wanted mobile meals and some assistance cleaning.  Aware can give her resources but not covered By insurance and would be private pay.  Will work toward discharge and sister will come in closer to discharge to attend therapies with pt.

## 2014-03-10 NOTE — Progress Notes (Signed)
Subjective/Complaints: Slept ok last noc No bowel movements, drinking prune juice Review of Systems - Negative except Left side weak Objective: Vital Signs: Blood pressure 150/80, pulse 77, temperature 98.1 F (36.7 C), temperature source Oral, resp. rate 17, weight 75.8 kg (167 lb 1.7 oz), SpO2 99.00%. No results found. Results for orders placed during the hospital encounter of 03/08/14 (from the past 72 hour(s))  CBC     Status: Abnormal   Collection Time    03/08/14  8:05 PM      Result Value Ref Range   WBC 5.3  4.0 - 10.5 K/uL   RBC 3.84 (*) 3.87 - 5.11 MIL/uL   Hemoglobin 11.4 (*) 12.0 - 15.0 g/dL   HCT 34.9 (*) 36.0 - 46.0 %   MCV 90.9  78.0 - 100.0 fL   MCH 29.7  26.0 - 34.0 pg   MCHC 32.7  30.0 - 36.0 g/dL   RDW 15.0  11.5 - 15.5 %   Platelets 164  150 - 400 K/uL  CREATININE, SERUM     Status: Abnormal   Collection Time    03/08/14  8:05 PM      Result Value Ref Range   Creatinine, Ser 0.98  0.50 - 1.10 mg/dL   GFR calc non Af Amer 51 (*) >90 mL/min   GFR calc Af Amer 59 (*) >90 mL/min   Comment: (NOTE)     The eGFR has been calculated using the CKD EPI equation.     This calculation has not been validated in all clinical situations.     eGFR's persistently <90 mL/min signify possible Chronic Kidney     Disease.  CBC WITH DIFFERENTIAL     Status: Abnormal   Collection Time    03/09/14  5:30 AM      Result Value Ref Range   WBC 5.5  4.0 - 10.5 K/uL   RBC 3.95  3.87 - 5.11 MIL/uL   Hemoglobin 11.6 (*) 12.0 - 15.0 g/dL   HCT 36.0  36.0 - 46.0 %   MCV 91.1  78.0 - 100.0 fL   MCH 29.4  26.0 - 34.0 pg   MCHC 32.2  30.0 - 36.0 g/dL   RDW 14.9  11.5 - 15.5 %   Platelets 153  150 - 400 K/uL   Neutrophils Relative % 46  43 - 77 %   Neutro Abs 2.5  1.7 - 7.7 K/uL   Lymphocytes Relative 37  12 - 46 %   Lymphs Abs 2.0  0.7 - 4.0 K/uL   Monocytes Relative 10  3 - 12 %   Monocytes Absolute 0.6  0.1 - 1.0 K/uL   Eosinophils Relative 6 (*) 0 - 5 %   Eosinophils Absolute  0.3  0.0 - 0.7 K/uL   Basophils Relative 0  0 - 1 %   Basophils Absolute 0.0  0.0 - 0.1 K/uL  COMPREHENSIVE METABOLIC PANEL     Status: Abnormal   Collection Time    03/09/14  5:30 AM      Result Value Ref Range   Sodium 145  137 - 147 mEq/L   Potassium 4.2  3.7 - 5.3 mEq/L   Chloride 107  96 - 112 mEq/L   CO2 27  19 - 32 mEq/L   Glucose, Bld 82  70 - 99 mg/dL   BUN 18  6 - 23 mg/dL   Creatinine, Ser 1.03  0.50 - 1.10 mg/dL   Calcium 9.7  8.4 - 10.5  mg/dL   Total Protein 6.4  6.0 - 8.3 g/dL   Albumin 2.9 (*) 3.5 - 5.2 g/dL   AST 21  0 - 37 U/L   ALT 13  0 - 35 U/L   Alkaline Phosphatase 60  39 - 117 U/L   Total Bilirubin 0.4  0.3 - 1.2 mg/dL   GFR calc non Af Amer 48 (*) >90 mL/min   GFR calc Af Amer 56 (*) >90 mL/min   Comment: (NOTE)     The eGFR has been calculated using the CKD EPI equation.     This calculation has not been validated in all clinical situations.     eGFR's persistently <90 mL/min signify possible Chronic Kidney     Disease.     HEENT: normal and poor dentition Cardio: RRR and no murmur Resp: CTA B/L and unlabored GI: BS positive and non distended Extremity:  Pulses positive and No Edema Skin:   Intact Neuro: Alert/Oriented, Cranial Nerve II-XII normal, Normal Sensory and Abnormal Motor 5/5 RUE, 3-/5 LUE, 2-/5 B HF, Left Quad, TA/Gastroc, 4-/5 Distal RLE Musc/Skel:  Other absent L patella Gen NAD   Assessment/Plan: 1. Functional deficits secondary to Right Pontine infarct which require 3+ hours per day of interdisciplinary therapy in a comprehensive inpatient rehab setting. Physiatrist is providing close team supervision and 24 hour management of active medical problems listed below. Physiatrist and rehab team continue to assess barriers to discharge/monitor patient progress toward functional and medical goals. Team conference today please see physician documentation under team conference tab, met with team face-to-face to discuss problems,progress,  and goals. Formulized individual treatment plan based on medical history, underlying problem and comorbidities. FIM: FIM - Bathing Bathing Steps Patient Completed: Chest;Right Arm;Left Arm;Abdomen;Left upper leg;Right upper leg;Buttocks Bathing: 3: Mod-Patient completes 5-7 53f 10 parts or 50-74%  FIM - Upper Body Dressing/Undressing Upper body dressing/undressing steps patient completed: Thread/unthread right sleeve of pullover shirt/dresss;Thread/unthread left sleeve of pullover shirt/dress;Pull shirt over trunk Upper body dressing/undressing: 4: Min-Patient completed 75 plus % of tasks FIM - Lower Body Dressing/Undressing Lower body dressing/undressing steps patient completed: Thread/unthread left underwear leg;Thread/unthread right underwear leg;Pull underwear up/down Lower body dressing/undressing: 3: Mod-Patient completed 50-74% of tasks     FIM - Radio producer Devices: Grab bars Toilet Transfers: 4-From toilet/BSC: Min A (steadying Pt. > 75%);4-To toilet/BSC: Min A (steadying Pt. > 75%)  FIM - Bed/Chair Transfer Bed/Chair Transfer Assistive Devices: Bed rails Bed/Chair Transfer: 3: Bed > Chair or W/C: Mod A (lift or lower assist);3: Chair or W/C > Bed: Mod A (lift or lower assist);4: Supine > Sit: Min A (steadying Pt. > 75%/lift 1 leg)  FIM - Locomotion: Wheelchair Distance: 30 Locomotion: Wheelchair: 2: Travels 50 - 149 ft with minimal assistance (Pt.>75%) FIM - Locomotion: Ambulation Locomotion: Ambulation Assistive Devices: Administrator Ambulation/Gait Assistance: 3: Mod assist Locomotion: Ambulation: 1: Travels less than 50 ft with moderate assistance (Pt: 50 - 74%)  Comprehension Comprehension Mode: Auditory Comprehension: 6-Follows complex conversation/direction: With extra time/assistive device  Expression Expression Mode: Verbal Expression: 5-Expresses complex 90% of the time/cues < 10% of the time  Social Interaction Social  Interaction: 6-Interacts appropriately with others with medication or extra time (anti-anxiety, antidepressant).  Problem Solving Problem Solving: 5-Solves complex 90% of the time/cues < 10% of the time  Memory Memory: 6-More than reasonable amt of time  Medical Problem List and Plan:  1. Thrombotic right pontine infarct  2. DVT Prophylaxis/Anticoagulation: Subcutaneous Lovenox. Monitor platelet counts  and any signs of bleeding  3. Pain Management/osteoarthritis: Hydrocodone as needed. Monitor with increased mobility.  4. Neuropsych: This patient is capable of making decisions on her own behalf.  5. Dysphagia. Dysphagia 3 and thin liquids /followup speech therapy  6. Hypertension. Norvasc 10 mg daily, Tenormin 25 mg daily, lisinopril 5 mg daily. Monitor with increased mobility  7. Hypothyroidism. Synthroid. Latest TSH level I.686  8. Hyperlipidemia. Lipitor  LOS (Days) 2 A FACE TO FACE EVALUATION WAS PERFORMED  KIRSTEINS,ANDREW E 03/10/2014, 8:13 AM

## 2014-03-10 NOTE — Progress Notes (Addendum)
Occupational Therapy Session Note  Patient Details  Name: Shelia Marks MRN: [id number] Date of Birth: Jun 30, 1926  Today's Date: 03/10/2014 Time: 1120-1200 Time Calculation (min): 40 min  Short Term Goals: Week 1:  OT Short Term Goal 1 (Week 1): Patient will complete toilet transfer at supervision level OT Short Term Goal 2 (Week 1): Patient will complete LB dressing at min assist for standing balance OT Short Term Goal 3 (Week 1): Patient will complete 1 grooming task in standing with min assist OT Short Term Goal 4 (Week 1): Pt will demonstrate sustained attention to self-care task for 4 min with min cues  Skilled Therapeutic Interventions/Progress Updates:    Pt seen for ADL retraining with focus on functional transfers, safety awareness, and standing balance. Pt received in recliner chair with sister present. Pt requesting to complete toilet task. Pt min assist stand pivot transfer and steadying assist for standing balance during clothing management. Pt with continent episode. Completed bathing at sink with min-supervision sit<>stand x4. Pt with difficulty donning bil socks after propping up on stool and with crossover technique. Will continue to practice before adding AE. Discussed discharge plans with pt. Discussed meals on wheels having a waiting list. Sister agreeable to assist with meals, especially in the evening. Will practice simple meal prep with pt for breakfast and lunch. At end of session pt left sitting in w/c with all needs in reach. Pt with no dizziness throughout session.   Therapy Documentation Precautions:  Precautions Precautions: Fall Precaution Comments: left sided weakness Restrictions Weight Bearing Restrictions: No General:   Vital Signs:   Pain: No report of pain during therapy session.   Other Treatments:    See FIM for current functional status  Therapy/Group: Individual Therapy  Angela Vazguez, Quillian Quince 03/10/2014, 12:12 PM

## 2014-03-10 NOTE — Patient Care Conference (Signed)
Inpatient RehabilitationTeam Conference and Plan of Care Update Date: 03/10/2014   Time: 11:30 AM    Patient Name: Shelia Marks      Medical Record Number: [id number]  Date of Birth: 05/26/1926 Sex: Female         Room/Bed: 4M10C/4M10C-01 Payor Info: Payor: MEDICARE / Plan: MEDICARE PART A AND B / Product Type: *No Product type* /    Admitting Diagnosis: R CVA  Admit Date/Time:  03/08/2014  6:55 PM Admission Comments: No comment available   Primary Diagnosis:  <principal problem not specified> Principal Problem: <principal problem not specified>  Patient Active Problem List   Diagnosis Date Noted  . 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 CVA (cerebral vascular accident) 03/05/2014  . CVA (cerebral infarction) 03/05/2014  . Hypothyroidism   . Hypertension   . Senile osteoporosis   . Hyperlipidemia LDL goal < 100   . Alzheimer's disease     Expected Discharge Date: Expected Discharge Date: 03/23/14  Team Members Present: Physician leading conference: Dr. Alysia Penna Social Worker Present: Ovidio Kin, LCSW;Jenny Prevatt, LCSW Nurse Present: Dorthula Nettles, RN PT Present: Georjean Mode, PT;Blair Hobble, PT OT Present: Gareth Morgan, Lorelee Cover, OT PPS Coordinator present : Ileana Ladd, Lelan Pons, RN, CRRN     Current Status/Progress Goal Weekly Team Focus  Medical   participation is good, constipated  improve bowel program  med management and diet   Bowel/Bladder   Pt continent of  bowel and bladder  remain continent of bowel and bladder  maintain q1-2hr rounding for toileting and PRN   Swallow/Nutrition/ Hydration     [facility]        ADL's   min assist UB dressing, mod assist bathing and LB dressing, min assist functional transfers  Mod I overall  functional transfers, sit<>stand, L NMR, FM coordination, strengthening, activity tolerance,   Mobility   Min-Mod A with bed mobility and transfers; Mod A with gait; +2A with stair negotiation  Mod I  overall, with exception of supervision with stair negotiation  bed mobility, transfers, w/c mobility, gait/stair training, functional endurance, L NMR, standing balance.   Communication             Safety/Cognition/ Behavioral Observations    No unsafe behaviors        Pain   No complaints of pain  Remain free of pain      Skin   No current skin breakdown   Remain free of skin breakdown  assess skin q shift and PRN for signs of new skin breakdown      *See Care Plan and progress notes for long and short-term goals.  Barriers to Discharge: lives alone    Possible Resolutions to Barriers:  needs Mod I level    Discharge Planning/Teaching Needs:  Home with sister assisting her, unsure if going to her home or sister;s.  Wants to be as independent as possible      Team Discussion:  Vestibular eval-adjusting BP meds.  Making good progress-recovering from her stroke.  Sister to assist at discharge  Revisions to Treatment Plan:  none   Continued Need for Acute Rehabilitation Level of Care: The patient requires daily medical management by a physician with specialized training in physical medicine and rehabilitation for the following conditions: Daily direction of a multidisciplinary physical rehabilitation program to ensure safe treatment while eliciting the highest outcome that is of practical value to the patient.: Yes Daily analysis of laboratory values and/or radiology reports with any subsequent need for  medication adjustment of medical intervention for : Neurological problems;Other  Elease Hashimoto 03/11/2014, 8:42 AM

## 2014-03-10 NOTE — Progress Notes (Signed)
Physical Therapy Session Note  Patient Details  Name: Shelia Marks MRN: [id number] Date of Birth: 06-02-1926  Today's Date: 03/10/2014 Time: 848 [id number] Time Calculation (min): 53 min and 48 min  Short Term Goals: Week 1:  PT Short Term Goal 1 (Week 1): Pt will consistently perform supine<>sit with supervision with HOB flat, no rails. PT Short Term Goal 2 (Week 1): Pt will perform bed<>chair transfer with close supervision. PT Short Term Goal 3 (Week 1): Pt will perform w/c mobility x150' in controlled environment with supervision. PT Short Term Goal 4 (Week 1): Pt will perform gait x25' in controlled environment with min A using LRAD. PT Short Term Goal 5 (Week 1): Pt will negotiate 2 stairs with L rail with mod A.  Skilled Therapeutic Interventions/Progress Updates:    Treatment Session 1: Pt received seated in bedside chair, agreeable to therapy. Session focused on continued gait training, functional transfers. Pt reports no dizziness throughout session. Vitals taken in seated, then immediately after standing. No decrease in BP with positional change; see vital signs for detailed readings.  Gait x20' in controlled environment with rolling walker, min A. Gait trial ended secondary to pt-reported L knee pain. Gross examination of L knee reveals effusion; no point tenderness. Pt reports having "no cushion" in L knee joint. Transported pt to treatment gym, where pt performed gait x8' with bilat UE support at parallel bars with min A. Examination of gait reveals L genu recurvatum during LLE mid-terminal stance phase of gait. Therapist explained knee hyperextension, reason recurvatum often occurs after stroke, and reason it causes pain in L knee. Pt verbalized understanding. Therapist explained how activation of hip extensors (gluteus maximus) during LLE stance phase of gait can prevent recurvatum. PT educated pt on isometric contraction gluteus maximus. Pt verbalized understanding and  gave effective return demonstration.  Transported pt to room in w/c, where pt performed multiple stand pivot transfers from bedside chair<>w/c with rolling walker and min A, mod cueing for safe use of rolling walker as well as appropriate hand placement during transfers; pt required reinforcement during ~50% of transfers during remainder of session. L genu recurvatum controlled during transfers by reminding pt to activate Lg gluteus maximus. Therapist departed with pt seated in bedside chair with all needs within reach.  Treatment Session 2: Pt received seated on toilet, agreeable to session. Performed toilet transfers with min A, grab bars; mod verbal cueing for safety awareness. Applied ACE bandage to LLE to prevent L genu recurvatum during LLE stance phase. Pt performed gait 2x50', 1x96' in controlled environment with rolling walker and min A, manual facilitation at L axilla, R ribs for upright posture; tactile cueing at L gluteus maximus to promote L hip extension, address recurvatum. L genu recurvatum controlled by Ace bandages x2 during all gait trials. Attempted to trial L AFO (blue rocker) to control proximal LLE weakness; however, pt's shoes not appropriate for AFO. Therapist recommended pt's family bring pt lace-up shoes from home when possible.   Explained, demonstrated management of w/c parts. Brake lock extenders added to w/c to promote pt independence with management of brakes, transfer setup. Performed stand pivot transfers from bedside chair<>w/c with rolling walker and min A, mod verbal cueing for safe transfer setup. Pt with effective between-session carryover of cueing for safe hand placement during transfers. Therapist departed with pt seated in bedside chair with all needs within reach.  Therapy Documentation Precautions:  Precautions Precautions: Fall Precaution Comments: left sided weakness Restrictions Weight Bearing Restrictions: No Vital Signs: Therapy  Vitals Temp: 98.3 F  (36.8 C) Temp src: Oral Pulse Rate: 67 Resp: 17 BP: 127/74 mmHg Patient Position, if appropriate: Sitting Oxygen Therapy SpO2: 100 % O2 Device: None (Room air) Pain: Pain Assessment Pain Assessment: No/denies pain Pain Score: 0-No pain Pain Type: Acute pain Pain Location: Knee Pain Orientation: Left Pain Descriptors / Indicators: Grimacing Pain Onset: Other (Comment) (With weightbearing) Pain Intervention(s): Rest;Other (Comment);Splinting (Utilized ACE bandage to prevent L genu recurvatum during standing/gait) Multiple Pain Sites: No Locomotion : Ambulation Ambulation/Gait Assistance: 4: Min assist   See FIM for current functional status  Therapy/Group: Individual Therapy  Hobble, Malva Cogan 03/10/2014, 3:51 PM

## 2014-03-11 ENCOUNTER — Inpatient Hospital Stay (HOSPITAL_COMMUNITY): Payer: Medicare Other | Admitting: Physical Therapy

## 2014-03-11 ENCOUNTER — Inpatient Hospital Stay (HOSPITAL_COMMUNITY): Payer: Medicare Other

## 2014-03-11 DIAGNOSIS — I6330 Cerebral infarction due to thrombosis of unspecified cerebral artery: Secondary | ICD-10-CM

## 2014-03-11 DIAGNOSIS — I10 Essential (primary) hypertension: Secondary | ICD-10-CM

## 2014-03-11 DIAGNOSIS — I69920 Aphasia following unspecified cerebrovascular disease: Secondary | ICD-10-CM

## 2014-03-11 DIAGNOSIS — I69991 Dysphagia following unspecified cerebrovascular disease: Secondary | ICD-10-CM

## 2014-03-11 NOTE — Progress Notes (Signed)
Physical Therapy Session Note  Patient Details  Name: Shelia Marks MRN: [id number] Date of Birth: Sep 05, 1926  Today's Date: 03/11/2014 Time: 3244-0102 and 1115-1200 and 7253-6644 Time Calculation (min): 48 min and 30 min and 44 min  Short Term Goals: Week 1:  PT Short Term Goal 1 (Week 1): Pt will consistently perform supine<>sit with supervision with HOB flat, no rails. PT Short Term Goal 2 (Week 1): Pt will perform bed<>chair transfer with close supervision. PT Short Term Goal 3 (Week 1): Pt will perform w/c mobility x150' in controlled environment with supervision. PT Short Term Goal 4 (Week 1): Pt will perform gait x25' in controlled environment with min A using LRAD. PT Short Term Goal 5 (Week 1): Pt will negotiate 2 stairs with L rail with mod A.  Skilled Therapeutic Interventions/Progress Updates:    Treatment Session 1: Pt received seated in bedside chair finishing breakfast; agreeable to therapy. Session focused on improving stability with gait and stair negotiation. Progressed L isometric gluteus maximus contraction to concentric contractions (pushing LLE into seat and holding x3 seconds) of hamstrings (knee extended), and gluteus maximus (knee flexed). Pt with effective return demonstration.  Seated in bedside chair, donned pants, shirt, Ted hose, and socks. Applied ACE bandages x2 at LLE to prevent L genu recurvatum with standing/gait. Pt with ineffective between-session carryover of safe hand placement on rolling walker during sit<>stand and stand pivot transfers. Within-session carryover of hand placement effective; no cueing required.  Gait x58', x61' in controlled environment with rolling walker and min A, multimodal cueing for contraction of L gluteus maximus during LLE stance phase to prevent L genu recurvatum, promote L hip extension. Both gait trials ended due to onset of recurvatum. Negotiated 3 (6.5") steps with bilat rails, forward-facing with step-to pattern requiring  mod A, mod cueing for technique, to promote bilat hip extension,  Pt with decreased safety awareness, as exhibited by pt asking therapist to allow her to attempt to ambulate without assistive device or assistance. Therapist reiterated importance of pt utilizing rolling walker and having hands-on staff assistance with all transfers/gait due to gait instability and balance impairments. Pt verbalized understanding. ACE bandage removed. Therapist departed with pt seated in bedside chair with all needs within reach.   Treatment Session 2: Pt received seated in bedside chair having just finished B&D. Agreeable to PT. Session focused on continued gait training (addressing L genu recurvatum) and stair negotiation. Pt performed multiple sit<>stand transfers from standard chair height to rolling walker with min guard-min A; subtle to no cueing required for safe hand placement.  Negotiated 3 steps with bilat rails, forward-facing with step-to pattern and mod A, verbal cueing for sequencing and technique.  Placed 3/8" heel lift in L shoe to address L genu recurvatum. Performed gait x30' in controlled environment with rolling walker and min A; recurvatum controlled for initial 20'; however, onset of L genu recurvatum and significant L genu varus occurred during final 10'.    W/c mobility x100' in controlled environment using bilat LE's with supervision. Second w/c propulsion trial for additional 100' in controlled environment using LLE only to increase hamstring strength with min A, tactile cueing at L knee to increase weightbearing.Therapist departed with pt seated in bedside chair with bilat LE's elevated and all needs within reach.  Treatment Session 3:Pt received supine in bed, asleep but easily aroused. Orthotist, Gerald Stabs, present to assess/address possible pt need for orthosis. After orthotist evaluated knee posture in seated, then dynamic knee mechanics with gait x30' in controlled  environment with rolling walker  and min A. Orthotist and PT educated pt on how forces exerted on pt's L knee during gait could negatively affect structural integrity of knee joint and how external control of brace could limit said forces. Therapist reiterated that L knee hyperextension and M/L instability is significantly decreasing safety of gait at this time. Pt verbalized understanding and agreed to trialing a brace during therapy. Will re-assess gait with trial brace and evaluate likelihood of pt compliance with brace at that time.  Following meeting with orthotist, pt incontinent of urine due to urgency, inability to get to bathroom quickly enough. While therapist assisting pt with hygiene and changing brief, pt with decreased safety awareness, as exhibited by attempting to stand independently within 10 seconds of therapist asking pt to remain seated on toilet. Will discuss safety concerns with multidisciplinary team. Therapist departed with pt seated in bedside chair with friend present and all needs within reach.   Therapy Documentation Precautions:  Precautions Precautions: Fall Precaution Comments: left sided weakness Restrictions Weight Bearing Restrictions: No General:   Vital Signs: Therapy Vitals Temp: 98.4 F (36.9 C) Temp src: Oral Pulse Rate: 65 Resp: 18 BP: 115/72 mmHg Patient Position, if appropriate: Sitting Oxygen Therapy SpO2: 97 % O2 Device: None (Room air) Pain: Pain Assessment Pain Assessment: No/denies pain Pain Score: 0-No pain Locomotion : Ambulation Ambulation/Gait Assistance: 4: Min assist Wheelchair Mobility Distance: 60  Balance:   Exercises:   Other Treatments:    See FIM for current functional status  Therapy/Group: Individual Therapy  Hobble, Malva Cogan 03/11/2014, 9:01 AM

## 2014-03-11 NOTE — Progress Notes (Signed)
Social Work Patient ID: [name], female   DOB: 08-30-1926, 80 y.o.   MRN: [id number] Elease Hashimoto, LCSW Social Worker Signed  Patient Care Conference Service date: 03/10/2014 1:28 PM  Inpatient RehabilitationTeam Conference and Plan of Care Update Date: 03/10/2014   Time: 11:30 AM     Patient Name: Shelia Marks       Medical Record Number: [id number]   Date of Birth: [birth year] Sex: Female         Room/Bed: 4M10C/4M10C-01 Payor Info: Payor: MEDICARE / Plan: MEDICARE PART A AND B / Product Type: *No Product type* /   Admitting Diagnosis: R CVA   Admit Date/Time:  03/08/2014  6:55 PM Admission Comments: No comment available   Primary Diagnosis:  <principal problem not specified> Principal Problem: <principal problem not specified>    Patient Active Problem List     Diagnosis  Date Noted   .  Thrombotic cerebral infarction  03/08/2014   .  CVA (cerebral vascular accident)  03/05/2014   .  CVA (cerebral infarction)  03/05/2014   .  Hypothyroidism     .  Hypertension     .  Senile osteoporosis     .  Hyperlipidemia LDL goal < 100     .  Alzheimer's disease       Expected Discharge Date: Expected Discharge Date: 03/23/14  Team Members Present: Physician leading conference: Dr. Alysia Penna Social Worker Present: Ovidio Kin, LCSW;Jenny Prevatt, LCSW Nurse Present: Dorthula Nettles, RN PT Present: Georjean Mode, PT;Blair Hobble, PT OT Present: Gareth Morgan, Lorelee Cover, OT PPS Coordinator present : Ileana Ladd, Lelan Pons, RN, CRRN        Current Status/Progress  Goal  Weekly Team Focus   Medical     participation is good, constipated  improve bowel program  med management and diet   Bowel/Bladder     Pt continent of  bowel and bladder  remain continent of bowel and bladder  maintain q1-2hr rounding for toileting and PRN   Swallow/Nutrition/ Hydration     [facility]       ADL's     min assist UB dressing, mod assist bathing and LB dressing, min  assist functional transfers  Mod I overall  functional transfers, sit<>stand, L NMR, FM coordination, strengthening, activity tolerance,   Mobility     Min-Mod A with bed mobility and transfers; Mod A with gait; +2A with stair negotiation  Mod I overall, with exception of supervision with stair negotiation  bed mobility, transfers, w/c mobility, gait/stair training, functional endurance, L NMR, standing balance.   Communication            Safety/Cognition/ Behavioral Observations    No unsafe behaviors       Pain     No complaints of pain  Remain free of pain     Skin     No current skin breakdown   Remain free of skin breakdown  assess skin q shift and PRN for signs of new skin breakdown     *See Care Plan and progress notes for long and short-term goals.    Barriers to Discharge:  lives alone      Possible Resolutions to Barriers:    needs Mod I level      Discharge Planning/Teaching Needs:    Home with sister assisting her, unsure if going to her home or sister;s.  Wants to be as independent as possible      Team Discussion:    Vestibular  eval-adjusting BP meds.  Making good progress-recovering from her stroke.  Sister to assist at discharge   Revisions to Treatment Plan:    none    Continued Need for Acute Rehabilitation Level of Care: The patient requires daily medical management by a physician with specialized training in physical medicine and rehabilitation for the following conditions: Daily direction of a multidisciplinary physical rehabilitation program to ensure safe treatment while eliciting the highest outcome that is of practical value to the patient.: Yes Daily analysis of laboratory values and/or radiology reports with any subsequent need for medication adjustment of medical intervention for : Neurological problems;Other  Elease Hashimoto 03/11/2014, 8:42 AM

## 2014-03-11 NOTE — IPOC Note (Signed)
Overall Plan of Care [facility]) Patient Details Name: Shelia Marks MRN: [id number] DOB: [birth year]  Admitting Diagnosis: R CVA  Hospital Problems: Active Problems:   Thrombotic cerebral infarction     Functional Problem List: Nursing Endurance;Pain;Medication Management;Safety;Skin Integrity  PT Balance;Edema;Endurance;Motor;Pain;Safety  OT Balance;Cognition;Endurance;Motor;Safety;Vision  SLP    TR         Basic ADL's: OT Grooming;Bathing;Dressing;Toileting;Eating     Advanced  ADL's: OT Laundry     Transfers: PT Bed Mobility;Bed to Chair;Car;Furniture;Floor  OT Toilet;Tub/Shower     Locomotion: PT Ambulation;Wheelchair Mobility;Stairs     Additional Impairments: OT Fuctional Use of Upper Extremity  SLP        TR      Anticipated Outcomes Item Anticipated Outcome  Self Feeding Mod I  Swallowing      Basic self-care  Mod I  Toileting  Mod I    Bathroom Transfers Mod I  Bowel/Bladder  Remain cont of Bladder and Bowel  Transfers  Mod I  Locomotion  Supervision to Mod I  Communication     Cognition     Pain  Rate pain less <3 out of 10  Safety/Judgment  No falls this admission and no falls this admission   Therapy Plan: PT Intensity: Minimum of 1-2 x/day ,45 to 90 minutes PT Frequency: 5 out of 7 days PT Duration Estimated Length of Stay: 12-14 days OT Intensity: Minimum of 1-2 x/day, 45 to 90 minutes OT Frequency: 5 out of 7 days OT Duration/Estimated Length of Stay: 12-14 days         Team Interventions: Nursing Interventions Patient/Family Education;Discharge Planning;Pain Management;Skin Care/Wound Management;Medication Management  PT interventions Ambulation/gait training;Balance/vestibular training;Disease management/prevention;Discharge planning;Functional electrical stimulation;Functional mobility training;Patient/family education;Pain management;Neuromuscular re-education;DME/adaptive equipment  instruction;Splinting/orthotics;Therapeutic Exercise;Therapeutic Activities;Stair training;UE/LE Strength taining/ROM;UE/LE Coordination activities;Visual/perceptual remediation/compensation;Wheelchair propulsion/positioning  OT Interventions Balance/vestibular training;Cognitive remediation/compensation;Discharge planning;DME/adaptive equipment instruction;Neuromuscular re-education;Functional mobility training;Psychosocial support;Self Care/advanced ADL retraining;Therapeutic Activities;Therapeutic Exercise;UE/LE Strength taining/ROM;UE/LE Coordination activities;Visual/perceptual remediation/compensation;Patient/family education  SLP Interventions    TR Interventions    SW/CM Interventions Discharge Planning;Psychosocial Support;Patient/Family Education    Team Discharge Planning: Destination: PT-Home ,OT- Home , SLP-Home Projected Follow-up: PT-Outpatient PT, OT-  Home health OT, SLP-None Projected Equipment Needs: PT-To be determined, OT- Tub/shower bench, SLP-None recommended by SLP Equipment Details: PT-Pt owns standard cane and quad cane., OT-pt reporting she only sponge bathes and is unsure if she wants to shower Patient/family involved in discharge planning: PT- Patient,  OT-Patient, SLP-Patient  MD ELOS: 14-20days Medical Rehab Prognosis:  Good Assessment: 80 y.o. right-handed female with history of hypertension, hyperlipidemia. Admitted 03/05/2014 with slurred speech and left-sided weakness. Patient lives alone and used a cane prior to admission. MRI of the brain showed a 6 mm acute right pontine infarct. MRA of the head and neck with no major occlusion or stenosis. Carotid Dopplers with no ICA stenosis. Echocardiogram with ejection fraction of 78% grade 1 diastolic dysfunction. Patient did not receive TPA. Neurology services consulted placed on aspirin for CVA prophylaxis as well as subcutaneous Lovenox for DVT prophylaxis   Now requiring 24/7 Rehab RN,MD, as well as CIR level PT, OT  and SLP.  Treatment team will focus on ADLs and mobility with goals set at Mod I  See Team Conference Notes for weekly updates to the plan of care

## 2014-03-11 NOTE — Progress Notes (Signed)
Occupational Therapy Session Note  Patient Details  Name: Shelia Marks MRN: [id number] Date of Birth: 1926-07-01  Today's Date: 03/11/2014 Time: 1300-1400 Time Calculation (min): 60 min   Skilled Therapeutic Interventions/Progress Updates:    Pt seen during a UE Exercise group. She worked on a variety of exercises to increase arm strength and overall activity tolerance including arm pushups from the wheelchair, weighted dowel exercises, alternating knee raised to increase core strength, and Pilates based active/isometric exercises. Due to prior RTC injury in R shoulder, pt was instructed to keep AROM to 90 degrees in shoulder.  Pt was instructed in several exercises she could do at home without equipment.  Pt tolerated all the exercises well with minimal rest breaks. Pt returned to her room at the end of her session with call light in reach.    Therapy Documentation Precautions:  Precautions Precautions: Fall Precaution Comments: left sided weakness Restrictions Weight Bearing Restrictions: No   Pain: Pain Assessment Pain Assessment: No/denies pain ADL:    See FIM for current functional status  Therapy/Group: Group Therapy  Shelia Marks 03/11/2014, 3:00 PM

## 2014-03-11 NOTE — Progress Notes (Signed)
Subjective/Complaints: Slept ok last noc Discussed D/C date, tired after therapy yest Review of Systems - Negative except Left side weak Objective: Vital Signs: Blood pressure 137/80, pulse 68, temperature 98.4 F (36.9 C), temperature source Oral, resp. rate 18, weight 75.8 kg (167 lb 1.7 oz), SpO2 97.00%. No results found. Results for orders placed during the hospital encounter of 03/08/14 (from the past 72 hour(s))  CBC     Status: Abnormal   Collection Time    03/08/14  8:05 PM      Result Value Ref Range   WBC 5.3  4.0 - 10.5 K/uL   RBC 3.84 (*) 3.87 - 5.11 MIL/uL   Hemoglobin 11.4 (*) 12.0 - 15.0 g/dL   HCT 34.9 (*) 36.0 - 46.0 %   MCV 90.9  78.0 - 100.0 fL   MCH 29.7  26.0 - 34.0 pg   MCHC 32.7  30.0 - 36.0 g/dL   RDW 15.0  11.5 - 15.5 %   Platelets 164  150 - 400 K/uL  CREATININE, SERUM     Status: Abnormal   Collection Time    03/08/14  8:05 PM      Result Value Ref Range   Creatinine, Ser 0.98  0.50 - 1.10 mg/dL   GFR calc non Af Amer 51 (*) >90 mL/min   GFR calc Af Amer 59 (*) >90 mL/min   Comment: (NOTE)     The eGFR has been calculated using the CKD EPI equation.     This calculation has not been validated in all clinical situations.     eGFR's persistently <90 mL/min signify possible Chronic Kidney     Disease.  CBC WITH DIFFERENTIAL     Status: Abnormal   Collection Time    03/09/14  5:30 AM      Result Value Ref Range   WBC 5.5  4.0 - 10.5 K/uL   RBC 3.95  3.87 - 5.11 MIL/uL   Hemoglobin 11.6 (*) 12.0 - 15.0 g/dL   HCT 36.0  36.0 - 46.0 %   MCV 91.1  78.0 - 100.0 fL   MCH 29.4  26.0 - 34.0 pg   MCHC 32.2  30.0 - 36.0 g/dL   RDW 14.9  11.5 - 15.5 %   Platelets 153  150 - 400 K/uL   Neutrophils Relative % 46  43 - 77 %   Neutro Abs 2.5  1.7 - 7.7 K/uL   Lymphocytes Relative 37  12 - 46 %   Lymphs Abs 2.0  0.7 - 4.0 K/uL   Monocytes Relative 10  3 - 12 %   Monocytes Absolute 0.6  0.1 - 1.0 K/uL   Eosinophils Relative 6 (*) 0 - 5 %   Eosinophils  Absolute 0.3  0.0 - 0.7 K/uL   Basophils Relative 0  0 - 1 %   Basophils Absolute 0.0  0.0 - 0.1 K/uL  COMPREHENSIVE METABOLIC PANEL     Status: Abnormal   Collection Time    03/09/14  5:30 AM      Result Value Ref Range   Sodium 145  137 - 147 mEq/L   Potassium 4.2  3.7 - 5.3 mEq/L   Chloride 107  96 - 112 mEq/L   CO2 27  19 - 32 mEq/L   Glucose, Bld 82  70 - 99 mg/dL   BUN 18  6 - 23 mg/dL   Creatinine, Ser 1.03  0.50 - 1.10 mg/dL   Calcium 9.7  8.4 -  10.5 mg/dL   Total Protein 6.4  6.0 - 8.3 g/dL   Albumin 2.9 (*) 3.5 - 5.2 g/dL   AST 21  0 - 37 U/L   ALT 13  0 - 35 U/L   Alkaline Phosphatase 60  39 - 117 U/L   Total Bilirubin 0.4  0.3 - 1.2 mg/dL   GFR calc non Af Amer 48 (*) >90 mL/min   GFR calc Af Amer 56 (*) >90 mL/min   Comment: (NOTE)     The eGFR has been calculated using the CKD EPI equation.     This calculation has not been validated in all clinical situations.     eGFR's persistently <90 mL/min signify possible Chronic Kidney     Disease.     HEENT: normal and poor dentition Cardio: RRR and no murmur Resp: CTA B/L and unlabored GI: BS positive and non distended Extremity:  Pulses positive and No Edema Skin:   Intact Neuro: Alert/Oriented, Cranial Nerve II-XII normal, Normal Sensory and Abnormal Motor 5/5 RUE, 3-/5 LUE, 2-/5 B HF, Left Quad, TA/Gastroc, 4-/5 Distal RLE Musc/Skel:  Other absent L patella Gen NAD   Assessment/Plan: 1. Functional deficits secondary to Right Pontine infarct which require 3+ hours per day of interdisciplinary therapy in a comprehensive inpatient rehab setting. Physiatrist is providing close team supervision and 24 hour management of active medical problems listed below. Physiatrist and rehab team continue to assess barriers to discharge/monitor patient progress toward functional and medical goals.  FIM: FIM - Bathing Bathing Steps Patient Completed: Chest;Right Arm;Left Arm;Abdomen;Left upper leg;Right upper leg;Buttocks;Front  perineal area Bathing: 4: Min-Patient completes 8-9 [id number] parts or 75+ percent  FIM - Upper Body Dressing/Undressing Upper body dressing/undressing steps patient completed: Thread/unthread right sleeve of pullover shirt/dresss;Thread/unthread left sleeve of pullover shirt/dress;Pull shirt over trunk Upper body dressing/undressing: 4: Min-Patient completed 75 plus % of tasks FIM - Lower Body Dressing/Undressing Lower body dressing/undressing steps patient completed: Thread/unthread left underwear leg;Thread/unthread right underwear leg;Pull underwear up/down;Thread/unthread right pants leg;Thread/unthread left pants leg;Pull pants up/down Lower body dressing/undressing: 4: Min-Patient completed 75 plus % of tasks  FIM - Toileting Toileting steps completed by patient: Adjust clothing prior to toileting;Performs perineal hygiene;Adjust clothing after toileting Toileting: 5: Supervision: Safety issues/verbal cues  FIM - TRadio producerDevices: Grab bars Toilet Transfers: 4-From toilet/BSC: Min A (steadying Pt. > 75%)  FIM - BControl and instrumentation engineerDevices: Walker;Arm rests Bed/Chair Transfer: 4: Chair or W/C > Bed: Min A (steadying Pt. > 75%);4: Bed > Chair or W/C: Min A (steadying Pt. > 75%)  FIM - Locomotion: Wheelchair Distance: 30 Locomotion: Wheelchair: 1: Total Assistance/staff pushes wheelchair (Pt<25%) FIM - Locomotion: Ambulation Locomotion: Ambulation Assistive Devices: Walker - Rolling;Other (comment) (ACE bandage at LLE to prevent genu recurvatum) Ambulation/Gait Assistance: 4: Min assist Locomotion: Ambulation: 2: Travels 587- 149 ft with minimal assistance (Pt.>75%)  Comprehension Comprehension Mode: Auditory Comprehension: 6-Follows complex conversation/direction: With extra time/assistive device  Expression Expression Mode: Verbal Expression: 5-Expresses complex 90% of the time/cues < 10% of the time  Social  Interaction Social Interaction: 6-Interacts appropriately with others with medication or extra time (anti-anxiety, antidepressant).  Problem Solving Problem Solving: 5-Solves complex 90% of the time/cues < 10% of the time  Memory Memory: 6-More than reasonable amt of time  Medical Problem List and Plan:  1. Thrombotic right pontine infarct  2. DVT Prophylaxis/Anticoagulation: Subcutaneous Lovenox. Monitor platelet counts and any signs of bleeding  3. Pain Management/osteoarthritis: Hydrocodone as  needed. Monitor with increased mobility.  4. Neuropsych: This patient is capable of making decisions on her own behalf.  5. Dysphagia. Dysphagia 3 and thin liquids /followup speech therapy  6. Hypertension. Norvasc 10 mg daily, Tenormin 25 mg daily, lisinopril 5 mg daily. Monitor with increased mobility  7. Hypothyroidism. Synthroid. Latest TSH level I.686  8. Hyperlipidemia. Lipitor  LOS (Days) 3 A FACE TO FACE EVALUATION WAS PERFORMED  Yazen Rosko E 03/11/2014, 7:22 AM

## 2014-03-11 NOTE — Progress Notes (Signed)
Occupational Therapy Session Note  Patient Details  Name: Shelia Marks MRN: [id number] Date of Birth: 1926-08-04  Today's Date: 03/11/2014 Time: 2725-3664 Time Calculation (min): 45 min  Short Term Goals: Week 1:  OT Short Term Goal 1 (Week 1): Patient will complete toilet transfer at supervision level OT Short Term Goal 2 (Week 1): Patient will complete LB dressing at min assist for standing balance OT Short Term Goal 3 (Week 1): Patient will complete 1 grooming task in standing with min assist OT Short Term Goal 4 (Week 1): Pt will demonstrate sustained attention to self-care task for 4 min with min cues  Skilled Therapeutic Interventions/Progress Updates:    Pt seen for ADL retraining with focus on safety awareness, attention to task, problem solving, and standing balance. Pt received sitting in recliner chair stating "they tell me I can't get up by myself." Discussed reasoning and utilized teach back for increases intellectual and emergent awareness with min cues. Ambulated to w/c approx 10 feet away with min assist and manual facilitation for L glute activation. Pt initiated use of RW for this. Completed bathing with min assist for feet and supervision for sit<>stand and standing balance. Pt with increased difficulty with threading LLE this AM and will trial stool tomorrow. Pt easily distracted during therapy session, requiring min cues for redirection to sustained attention. Pt required mod cues for sequencing and problem solving throughout bathing and dressing. At end of session pt became tearful speaking about deceased husband. Therapist provided emotional support. Pt left sitting in w/c with all needs in reach and no longer tearful.   Therapy Documentation Precautions:  Precautions Precautions: Fall Precaution Comments: left sided weakness Restrictions Weight Bearing Restrictions: No General:   Vital Signs:   Pain: No report of pain during therapy session.   See FIM for  current functional status  Therapy/Group: Individual Therapy  Duayne Cal 03/11/2014, 12:06 PM

## 2014-03-12 ENCOUNTER — Inpatient Hospital Stay (HOSPITAL_COMMUNITY): Payer: Medicare Other | Admitting: Physical Therapy

## 2014-03-12 ENCOUNTER — Inpatient Hospital Stay (HOSPITAL_COMMUNITY): Payer: Medicare Other | Admitting: Occupational Therapy

## 2014-03-12 ENCOUNTER — Inpatient Hospital Stay (HOSPITAL_COMMUNITY): Payer: Medicare Other

## 2014-03-12 NOTE — Progress Notes (Signed)
Subjective/Complaints: Tired after therapy yesterday Bowels and bladder doing well Review of Systems - Negative except Left side weak Objective: Vital Signs: Blood pressure 153/90, pulse 73, temperature 97.4 F (36.3 C), temperature source Oral, resp. rate 16, weight 75.8 kg (167 lb 1.7 oz), SpO2 98.00%. No results found. No results found for this or any previous visit (from the past 72 hour(s)).   HEENT: normal and poor dentition Cardio: RRR and no murmur Resp: CTA B/L and unlabored GI: BS positive and non distended Extremity:  Pulses positive and No Edema Skin:   Intact Neuro: Alert/Oriented, Cranial Nerve II-XII normal, Normal Sensory and Abnormal Motor 5/5 RUE, 3-/5 LUE, 2-/5 B HF, Left Quad, TA/Gastroc, 4-/5 Distal RLE Musc/Skel:  Other absent L patella Gen NAD   Assessment/Plan: 1. Functional deficits secondary to Right Pontine infarct which require 3+ hours per day of interdisciplinary therapy in a comprehensive inpatient rehab setting. Physiatrist is providing close team supervision and 24 hour management of active medical problems listed below. Physiatrist and rehab team continue to assess barriers to discharge/monitor patient progress toward functional and medical goals.  FIM: FIM - Bathing Bathing Steps Patient Completed: Chest;Right Arm;Left Arm;Abdomen;Left upper leg;Right upper leg;Buttocks;Front perineal area Bathing: 4: Min-Patient completes 8-9 53f 10 parts or 75+ percent  FIM - Upper Body Dressing/Undressing Upper body dressing/undressing steps patient completed: Thread/unthread right sleeve of pullover shirt/dresss;Thread/unthread left sleeve of pullover shirt/dress;Pull shirt over trunk;Put head through opening of pull over shirt/dress Upper body dressing/undressing: 5: Set-up assist to: Obtain clothing/put away FIM - Lower Body Dressing/Undressing Lower body dressing/undressing steps patient completed: Thread/unthread right underwear leg;Pull underwear  up/down;Thread/unthread right pants leg;Pull pants up/down Lower body dressing/undressing: 3: Mod-Patient completed 50-74% of tasks  FIM - Toileting Toileting steps completed by patient: Adjust clothing prior to toileting;Performs perineal hygiene;Adjust clothing after toileting Toileting: 5: Supervision: Safety issues/verbal cues  FIM - Radio producer Devices: Grab bars Toilet Transfers: 4-From toilet/BSC: Min A (steadying Pt. > 75%);4-To toilet/BSC: Min A (steadying Pt. > 75%)  FIM - Control and instrumentation engineer Devices: Walker;Arm rests Bed/Chair Transfer: 4: Chair or W/C > Bed: Min A (steadying Pt. > 75%);4: Bed > Chair or W/C: Min A (steadying Pt. > 75%)  FIM - Locomotion: Wheelchair Distance: 100 Locomotion: Wheelchair: 2: Travels 45 - 149 ft with supervision, cueing or coaxing FIM - Locomotion: Ambulation Locomotion: Ambulation Assistive Devices: Administrator Ambulation/Gait Assistance: 4: Min assist Locomotion: Ambulation: 2: Travels 50 - 149 ft with minimal assistance (Pt.>75%)  Comprehension Comprehension Mode: Auditory Comprehension: 6-Follows complex conversation/direction: With extra time/assistive device  Expression Expression Mode: Verbal Expression: 5-Expresses complex 90% of the time/cues < 10% of the time  Social Interaction Social Interaction: 6-Interacts appropriately with others with medication or extra time (anti-anxiety, antidepressant).  Problem Solving Problem Solving: 5-Solves complex 90% of the time/cues < 10% of the time  Memory Memory: 6-More than reasonable amt of time  Medical Problem List and Plan:  1. Thrombotic right pontine infarct  2. DVT Prophylaxis/Anticoagulation: Subcutaneous Lovenox. Monitor platelet counts and any signs of bleeding  3. Pain Management/osteoarthritis: Hydrocodone as needed. Monitor with increased mobility.  4. Neuropsych: This patient is capable of making decisions  on her own behalf.  5. Dysphagia. Dysphagia 3 and thin liquids /followup speech therapy  6. Hypertension. Norvasc 10 mg daily, Tenormin 25 mg daily, lisinopril 5 mg daily. Monitor with increased mobility  7. Hypothyroidism. Synthroid. Latest TSH level I.686  8. Hyperlipidemia. Lipitor  LOS (Days) 4 A  FACE TO FACE EVALUATION WAS PERFORMED  Shelia Marks 03/12/2014, 6:34 AM

## 2014-03-12 NOTE — Progress Notes (Signed)
Occupational Therapy Session Note  Patient Details  Name: Shelia Marks MRN: [id number] Date of Birth: September 08, 1926  Today's Date: 03/12/2014 Time: 7829-5621 Time Calculation (min): 27 min  Short Term Goals: Week 1:  OT Short Term Goal 1 (Week 1): Patient will complete toilet transfer at supervision level OT Short Term Goal 2 (Week 1): Patient will complete LB dressing at min assist for standing balance OT Short Term Goal 3 (Week 1): Patient will complete 1 grooming task in standing with min assist OT Short Term Goal 4 (Week 1): Pt will demonstrate sustained attention to self-care task for 4 min with min cues  Skilled Therapeutic Interventions/Progress Updates:    Pt seen for 1:1 OT session with focus on L NMR and standing balance. Pt received sitting in recliner chair. Ambulated approx 6 feet to w/c with min assist using RW and cues for L glute activation. Engaged in exercises in standing for strengthening L glute with min assist to increased independence with functional transfers and standing balance during self-care tasks. Engaged in L hand gross strength and coordination activities in kitchen via opening twist off containers of various sizes and flipping up lids. At end of session pt returned to room and left sitting in recliner chair with all needs in reach.   Therapy Documentation Precautions:  Precautions Precautions: Fall Precaution Comments: left sided weakness Restrictions Weight Bearing Restrictions: No General:   Vital Signs:   Pain: No report of pain during therapy session.   See FIM for current functional status  Therapy/Group: Individual Therapy  Duayne Cal 03/12/2014, 12:13 PM

## 2014-03-12 NOTE — Progress Notes (Signed)
Occupational Therapy Session Note  Patient Details  Name: Shelia Marks MRN: [id number] Date of Birth: 1926-03-04  Today's Date: 03/12/2014 Time: 0930-1027 Time Calculation (min): 57 min  Short Term Goals: Week 1:  OT Short Term Goal 1 (Week 1): Patient will complete toilet transfer at supervision level OT Short Term Goal 2 (Week 1): Patient will complete LB dressing at min assist for standing balance OT Short Term Goal 3 (Week 1): Patient will complete 1 grooming task in standing with min assist OT Short Term Goal 4 (Week 1): Pt will demonstrate sustained attention to self-care task for 4 min with min cues  Skilled Therapeutic Interventions/Progress Updates:    Engaged in ADL retraining with focus on attention to task, problem solving, standing balance, and safety awareness.  Pt seated in recliner upon arrival reporting increased fatigue this AM.  Completed stand pivot transfer to w/c with min assist.  Bathing completed at sit > stand level at sink with pt supervision for bathing all body parts, opting to not wash feet this session due to fatigue.  Pt continues to have difficulty threading Lt pant leg, attempted crossing over opposite knee without success.  Pt easily distracted throughout session with tangential discussion, requiring min cues to redirect to current task.  Engaged in [place] re-ed in sitting with focus on LUE [facility] with small peg board and threading beads.  Pt required increased time with threading beads, dropping 2/10 on floor due to decreased coordination.    Therapy Documentation Precautions:  Precautions Precautions: Fall Precaution Comments: left sided weakness Restrictions Weight Bearing Restrictions: No General:   Vital Signs: Therapy Vitals BP: 148/80 mmHg Pain:  Pt with no c/o pain  See FIM for current functional status  Therapy/Group: Individual Therapy  Simonne Come 03/12/2014, 10:35 AM

## 2014-03-12 NOTE — Progress Notes (Addendum)
Physical Therapy Session Note  Patient Details  Name: Shelia Marks MRN: [id number] Date of Birth: 11-22-1926  Today's Date: 03/12/2014 Time: 4076-8088 and 1500-1550 Time Calculation (min): 61 min and 50 min  Short Term Goals: Week 1:  PT Short Term Goal 1 (Week 1): Pt will consistently perform supine<>sit with supervision with HOB flat, no rails. PT Short Term Goal 2 (Week 1): Pt will perform bed<>chair transfer with close supervision. PT Short Term Goal 3 (Week 1): Pt will perform w/c mobility x150' in controlled environment with supervision. PT Short Term Goal 4 (Week 1): Pt will perform gait x25' in controlled environment with min A using LRAD. PT Short Term Goal 5 (Week 1): Pt will negotiate 2 stairs with L rail with mod A.  Skilled Therapeutic Interventions/Progress Updates:    Treatment Session 1: Pt received in bedside chair; agreeable to therapy. Session focused on safety awareness, addressing L knee instability during gait. Multiple sit<>stand transfers with rolling walker and min A; verbal cueing focused on safety awareness. Pt performed self-propulsion of w/c 2x100' in controlled environment using LLE only for hamstring strengthening; tactile cueing at L knee for weightbearing.   Pt insistent on ambulating despite L genu recurvatum, lateral knee instability. Therapist again explained rationale for strengthening, NMR as well as consequences for ambulating with ongoing knee instability. Therapist utilized pictures of knee posture to facilitate pt understanding. Orthotist will have trial L knee orthosis as early as 3/16.  Standing with bilat UE support at rolling walker: mini squats 3x10 reps, 2x8 reps (seated rest breaks between sets) with tactile cueing at distal L hamstrings, verbal cueing focusing on slow,controlled L knee extension, avoiding hyperextension. Pre-gait activities focused on dynamic neuromuscular control of L knee extension during L mid-terminal stance phase of  gait. Session ended in pt room, where pt was left seated in bedside chair with all needs within reach.  Treatment Session 2: Pt received seated in bedside chair with family members present; pt agreeable to session. Pt's sister and friend express ability to provide intermittent supervision/assistance as needed at discharge. Sister and friend educated on fact that 24/7 supervision may be needed at discharge due to limited safety awareness. Sister and friend verbalized understanding but did not express 24/7 availability. Therapist scheduled hands-on training at time convenient for caregivers. CSW, Robertson, notified.  Session continued to focused on safety awareness with functional transfers, controlling L knee instability during gait. Pt performed multiple sit<>stand transfers with rolling walker requiring close supervision to min guard, subtle cueing for safe hand placement.  Pt again attempted to perform transfer without rolling walker, no therapist nearby. This PT reiterated importance of utilizing rolling walker and having hands-on staff assist with all mobility. Pt states, "I know, but I feel like I'm doing really good now."  Theraband utilized at LLE to maintain safe knee position in sagittal/frontal planes during gait. Performed multiple gait trials x15-25' in controlled environment with rolling walker, min guard; mod verbal cueing for attention to task, tactile cueing at L distal hamstrings, tactile cueing at posterolateral aspect of L hip for LLE stance stability. All gait trials ended secondary to onset of L genu recurvatum, lateral instability of L knee. Session ended with pt seated in bedside chair with all needs within reach.   Therapy Documentation Precautions:  Precautions Precautions: Fall Precaution Comments: left sided weakness Restrictions Weight Bearing Restrictions: No Pain: Pain Assessment Pain Assessment: No/denies pain Pain Score: 0-No pain Locomotion  : Ambulation Ambulation/Gait Assistance: 4: Min guard Wheelchair Mobility Distance: 150  See FIM for current functional status  Therapy/Group: Individual Therapy  Stefano Gaul 03/12/2014, 4:09 PM

## 2014-03-13 DIAGNOSIS — G309 Alzheimer's disease, unspecified: Secondary | ICD-10-CM

## 2014-03-13 DIAGNOSIS — F0280 Dementia in other diseases classified elsewhere without behavioral disturbance: Secondary | ICD-10-CM

## 2014-03-13 DIAGNOSIS — E039 Hypothyroidism, unspecified: Secondary | ICD-10-CM

## 2014-03-13 DIAGNOSIS — I6330 Cerebral infarction due to thrombosis of unspecified cerebral artery: Secondary | ICD-10-CM

## 2014-03-13 DIAGNOSIS — I6350 Cerebral infarction due to unspecified occlusion or stenosis of unspecified cerebral artery: Secondary | ICD-10-CM

## 2014-03-13 NOTE — Progress Notes (Signed)
Shelia Marks is a 80 y.o. female 04-Jan-1926 [id number]  Subjective: No new complaints. No new problems. Slept well. Feeling OK.  Objective: Vital signs in last 24 hours: Temp:  [98.2 F (36.8 C)-98.7 F (37.1 C)] 98.7 F (37.1 C) (03/14 1642) Pulse Rate:  [69-70] 70 (03/14 1642) Resp:  [18] 18 (03/14 1642) BP: (119-147)/(74-82) 119/74 mmHg (03/14 1642) SpO2:  [97 %-99 %] 99 % (03/14 1642) Weight change:  Last BM Date: 03/13/14  Intake/Output from previous day: 03/13 0701 - 03/14 0700 In: 360 [P.O.:360] Out: -  Last cbgs: CBG (last 3)  No results found for this basename: GLUCAP,  in the last 72 hours   Physical Exam General: No apparent distress   HEENT: not dry Lungs: Normal effort. Lungs clear to auscultation, no crackles or wheezes. Cardiovascular: Regular rate and rhythm, no edema Abdomen: S/NT/ND; BS(+) Musculoskeletal:  unchanged Neurological: No new neurological deficits Wounds: N/A    Skin: clear  Aging changes Mental state: Alert, oriented, cooperative    Lab Results: BMET    Component Value Date/Time   NA 145 03/09/2014 0530   NA 146* 10/12/2013 1116   K 4.2 03/09/2014 0530   CL 107 03/09/2014 0530   CO2 27 03/09/2014 0530   GLUCOSE 82 03/09/2014 0530   GLUCOSE 90 10/12/2013 1116   BUN 18 03/09/2014 0530   BUN 17 10/12/2013 1116   CREATININE 1.03 03/09/2014 0530   CREATININE 1.09 07/24/2011 1656   CALCIUM 9.7 03/09/2014 0530   GFRNONAA 48* 03/09/2014 0530   GFRAA 56* 03/09/2014 0530   CBC    Component Value Date/Time   WBC 5.5 03/09/2014 0530   RBC 3.95 03/09/2014 0530   HGB 11.6* 03/09/2014 0530   HCT 36.0 03/09/2014 0530   PLT 153 03/09/2014 0530   MCV 91.1 03/09/2014 0530   MCH 29.4 03/09/2014 0530   MCHC 32.2 03/09/2014 0530   RDW 14.9 03/09/2014 0530   LYMPHSABS 2.0 03/09/2014 0530   MONOABS 0.6 03/09/2014 0530   EOSABS 0.3 03/09/2014 0530   BASOSABS 0.0 03/09/2014 0530    Studies/Results: No results found.  Medications: I have reviewed the  patient's current medications.  Assessment/Plan:   1. Thrombotic right pontine infarct  2. DVT Prophylaxis/Anticoagulation: Subcutaneous Lovenox. Monitor platelet counts and any signs of bleeding  3. Pain Management/osteoarthritis: Hydrocodone as needed. Monitor with increased mobility.  4. Neuropsych: This patient is capable of making decisions on her own behalf.  5. Dysphagia. Dysphagia 3 and thin liquids /followup speech therapy  6. Hypertension. Norvasc 10 mg daily, Tenormin 25 mg daily, lisinopril 5 mg daily. Monitor with increased mobility  7. Hypothyroidism. Synthroid. Latest TSH level I.686  8. Hyperlipidemia. Lipitor    Length of stay, days: 5  Walker Kehr , MD 03/13/2014, 4:46 PM

## 2014-03-14 ENCOUNTER — Inpatient Hospital Stay (HOSPITAL_COMMUNITY): Payer: Medicare Other | Admitting: Physical Therapy

## 2014-03-14 ENCOUNTER — Inpatient Hospital Stay (HOSPITAL_COMMUNITY): Payer: Medicare Other | Admitting: *Deleted

## 2014-03-14 DIAGNOSIS — I10 Essential (primary) hypertension: Secondary | ICD-10-CM

## 2014-03-14 NOTE — Progress Notes (Signed)
Physical Therapy Session Note  Patient Details  Name: Shelia Marks MRN: [id number] Date of Birth: [birth year]  Today's Date: 03/14/2014 Time: 2585-2778 Time Calculation (min): 40 min  Short Term Goals: Week 1:  PT Short Term Goal 1 (Week 1): Pt will consistently perform supine<>sit with supervision with HOB flat, no rails. PT Short Term Goal 2 (Week 1): Pt will perform bed<>chair transfer with close supervision. PT Short Term Goal 3 (Week 1): Pt will perform w/c mobility x150' in controlled environment with supervision. PT Short Term Goal 4 (Week 1): Pt will perform gait x25' in controlled environment with min A using LRAD. PT Short Term Goal 5 (Week 1): Pt will negotiate 2 stairs with L rail with mod A.  Skilled Therapeutic Interventions/Progress Updates:  Pt was seen bedside in the pm. Pt transferred supine to edge of bed with min guard, rolling walker and verbal cues. Pt propelled w/c about 100 feet with B UEs and S. Pt ambulated with rolling walker and min guard to min A about 88 feet. Performed LE exercises in w/c for LE strengthening and flexibility. Pt propelled w/c about 100 feet with B UEs. Pt returned to room and transferred w/c to recliner with min guard and verbal cues.   Therapy Documentation Precautions:  Precautions Precautions: Fall Precaution Comments: left sided weakness Restrictions Weight Bearing Restrictions: No General:   Pain: No c/o pain.    Locomotion : Ambulation Ambulation/Gait Assistance: 4: Min guard;4: Min assist   See FIM for current functional status  Therapy/Group: Individual Therapy  Dub Amis 03/14/2014, 2:39 PM

## 2014-03-14 NOTE — Progress Notes (Signed)
Occupational Therapy Session Note  Patient Details  Name: Shelia Marks MRN: [id number] Date of Birth: [birth year]  Today's Date: 03/14/2014 Time:  -    0730-0815   (45 min)   1st session                   1115-1200    (45 min)  2nd session    Short Term Goals: Week 1:  OT Short Term Goal 1 (Week 1): Patient will complete toilet transfer at supervision level OT Short Term Goal 2 (Week 1): Patient will complete LB dressing at min assist for standing balance OT Short Term Goal 3 (Week 1): Patient will complete 1 grooming task in standing with min assist OT Short Term Goal 4 (Week 1): Pt will demonstrate sustained attention to self-care task for 4 min with min cues  Skilled Therapeutic Interventions/Progress Updates:      1st session:  Focus of session was functional mobility, sit to stand, LUE NMRE.  Pt. ambulated from wc to sink with RW and min assist.  Pt. Sat to perform most of bathing and dressing.  Stood to don pants.  Pt. Propelled wc over to bed and set up for breakfast.       2nd session:  Addressed functional mobility, sit to stand in ADL kitchen.  Went over [employer].  Pt retrieved items from refrigerator and passed to another counter.  Educated pt on walker position when standing at counter, getting items out of dishwasher and moving food bowls from one area to another.  Pt.returned demonstrated with minimal verbal instruction.  Sister present during session.  Pt. Utilized LUE for reaching and holding items with no problems for light items.  She Ambulated back to wc and propelled self in wc back to room.  Left pt in wc with lunch and sister ready to assist as needed with set up  Therapy Documentation Precautions:  Precautions Precautions: Fall Precaution Comments: left sided weakness Restrictions Weight Bearing Restrictions: No    Vital Signs: Therapy Vitals TSpO2: 98 % O2 Device: None (Room air) Pain:  none   See FIM for current functional  status  Therapy/Group: Individual Therapy  Lisa Roca 03/14/2014, 8:08 AM

## 2014-03-14 NOTE — Progress Notes (Signed)
Physical Therapy Session Note  Patient Details  Name: Shelia Marks MRN: [id number] Date of Birth: June 22, 1926  Today's Date: 03/14/2014 Time: 8315-1761 Time Calculation (min): 57 min  Short Term Goals: Week 1:  PT Short Term Goal 1 (Week 1): Pt will consistently perform supine<>sit with supervision with HOB flat, no rails. PT Short Term Goal 2 (Week 1): Pt will perform bed<>chair transfer with close supervision. PT Short Term Goal 3 (Week 1): Pt will perform w/c mobility x150' in controlled environment with supervision. PT Short Term Goal 4 (Week 1): Pt will perform gait x25' in controlled environment with min A using LRAD. PT Short Term Goal 5 (Week 1): Pt will negotiate 2 stairs with L rail with mod A.  Skilled Therapeutic Interventions/Progress Updates:  Pt was seen bedside in the am. Pt propelled w/c about 100 feet with one rest, utilized B UE and LEs with S and occasional verbal cues. Pt transferred sit to stand with rolling walker min guard to min A. Pt ambulated with rolling walker and min guard to min A for 20 feet with verbal cues. Pt ascended/descended 2 stairs with right rail x 2 with mod A and verbal cues for technique and safety. Performed steps up and mini squats for LE strengthening and NMR. Pt ambulated with rolling walker for 40 feet x 2 with min guard to min A and verbal cues. Pt propelledw/c about 100 feet with B UEs and S with verbal cues. Increased recurvatum noted on L as pt fatigues.   Therapy Documentation Precautions:  Precautions Precautions: Fall Precaution Comments: left sided weakness Restrictions Weight Bearing Restrictions: No General:   Pain: No c/o pain.     Locomotion : Ambulation Ambulation/Gait Assistance: 4: Min guard;4: Min assist   See FIM for current functional status  Therapy/Group: Individual Therapy  Dub Amis 03/14/2014, 10:51 AM

## 2014-03-14 NOTE — Progress Notes (Signed)
Shelia Marks is a 80 y.o. female 12/21/1926 [id number]  Subjective: No new complaints. No new problems. Slept well. Feeling OK.  Objective: Vital signs in last 24 hours: Temp:  [98.5 F (36.9 C)-98.7 F (37.1 C)] 98.5 F (36.9 C) (03/15 0543) Pulse Rate:  [70] 70 (03/15 0543) Resp:  [17-18] 17 (03/15 0543) BP: (119-124)/(74-75) 124/75 mmHg (03/15 0543) SpO2:  [98 %-99 %] 98 % (03/15 0543) Weight change:  Last BM Date: 03/13/14  Intake/Output from previous day: 03/14 0701 - 03/15 0700 In: 720 [P.O.:720] Out: -  Last cbgs: CBG (last 3)  No results found for this basename: GLUCAP,  in the last 72 hours   Physical Exam General: No apparent distress   HEENT: not dry Lungs: Normal effort. Lungs clear to auscultation, no crackles or wheezes. Cardiovascular: Regular rate and rhythm, no edema Abdomen: S/NT/ND; BS(+) Musculoskeletal:  unchanged Neurological: No new neurological deficits Wounds: N/A    Skin: clear  Aging changes Mental state: Alert, oriented, cooperative    Lab Results: BMET    Component Value Date/Time   NA 145 03/09/2014 0530   NA 146* 10/12/2013 1116   K 4.2 03/09/2014 0530   CL 107 03/09/2014 0530   CO2 27 03/09/2014 0530   GLUCOSE 82 03/09/2014 0530   GLUCOSE 90 10/12/2013 1116   BUN 18 03/09/2014 0530   BUN 17 10/12/2013 1116   CREATININE 1.03 03/09/2014 0530   CREATININE 1.09 07/24/2011 1656   CALCIUM 9.7 03/09/2014 0530   GFRNONAA 48* 03/09/2014 0530   GFRAA 56* 03/09/2014 0530   CBC    Component Value Date/Time   WBC 5.5 03/09/2014 0530   RBC 3.95 03/09/2014 0530   HGB 11.6* 03/09/2014 0530   HCT 36.0 03/09/2014 0530   PLT 153 03/09/2014 0530   MCV 91.1 03/09/2014 0530   MCH 29.4 03/09/2014 0530   MCHC 32.2 03/09/2014 0530   RDW 14.9 03/09/2014 0530   LYMPHSABS 2.0 03/09/2014 0530   MONOABS 0.6 03/09/2014 0530   EOSABS 0.3 03/09/2014 0530   BASOSABS 0.0 03/09/2014 0530    Studies/Results: No results found.  Medications: I have reviewed the  patient's current medications.  Assessment/Plan:   1. Thrombotic right pontine infarct  2. DVT Prophylaxis/Anticoagulation: Subcutaneous Lovenox. Monitor platelet counts and any signs of bleeding  3. Pain Management/osteoarthritis: Hydrocodone as needed. Monitor with increased mobility.  4. Neuropsych: This patient is capable of making decisions on her own behalf.  5. Dysphagia. Dysphagia 3 and thin liquids /followup speech therapy  6. Hypertension. Norvasc 10 mg daily, Tenormin 25 mg daily, lisinopril 5 mg daily. Monitor with increased mobility  7. Hypothyroidism. Synthroid. Latest TSH level I.686  8. Hyperlipidemia. Lipitor  Cont Rx  Length of stay, days: 6  Walker Kehr , MD 03/14/2014, 2:27 PM

## 2014-03-15 ENCOUNTER — Inpatient Hospital Stay (HOSPITAL_COMMUNITY): Payer: Medicare Other

## 2014-03-15 ENCOUNTER — Inpatient Hospital Stay (HOSPITAL_COMMUNITY): Payer: Medicare Other | Admitting: Physical Therapy

## 2014-03-15 DIAGNOSIS — I6330 Cerebral infarction due to thrombosis of unspecified cerebral artery: Secondary | ICD-10-CM

## 2014-03-15 DIAGNOSIS — I69991 Dysphagia following unspecified cerebrovascular disease: Secondary | ICD-10-CM

## 2014-03-15 DIAGNOSIS — I69920 Aphasia following unspecified cerebrovascular disease: Secondary | ICD-10-CM

## 2014-03-15 DIAGNOSIS — I10 Essential (primary) hypertension: Secondary | ICD-10-CM

## 2014-03-15 LAB — CREATININE, SERUM
Creatinine, Ser: 0.87 mg/dL (ref 0.50–1.10)
GFR calc Af Amer: 68 mL/min — ABNORMAL LOW (ref >90)
GFR calc non Af Amer: 59 mL/min — ABNORMAL LOW (ref >90)

## 2014-03-15 LAB — GLUCOSE, CAPILLARY: Glucose-Capillary: 134 mg/dL — ABNORMAL HIGH (ref 70–99)

## 2014-03-15 NOTE — Progress Notes (Signed)
Occupational Therapy Session Note  Patient Details  Name: Shelia Marks MRN: [id number] Date of Birth: 11/02/1926  Today's Date: 03/15/2014 Time: [id number] and [id number] Time Calculation (min): 45 min and 40 min   Short Term Goals: Week 1:  OT Short Term Goal 1 (Week 1): Patient will complete toilet transfer at supervision level OT Short Term Goal 2 (Week 1): Patient will complete LB dressing at min assist for standing balance OT Short Term Goal 3 (Week 1): Patient will complete 1 grooming task in standing with min assist OT Short Term Goal 4 (Week 1): Pt will demonstrate sustained attention to self-care task for 4 min with min cues  Skilled Therapeutic Interventions/Progress Updates:    Session 1: Pt seen for 1:1 OT session with focus on problem solving, attention to task, L NMR, and safety awareness. Pt received sitting in w/c and declining bathing, however agreeable to change clothes as she was wearing gown. Pt retrieved clothing with min cues to ambulate with RW then pt placing them over front of RW to transport them. Pt with difficulty for LB dressing requiring cues for problem solving for crossover technique and propping on stool. Pt successful with using stool. Pt donned slip on shoes with heel on L foot not completely in shoe. Pt reports aware of this but couldn't reach to fix it. Provided LH shoe horn and pt successful. Pt unaware of safety concerns with not fixing shoe intially. Discussed safety concerns with pt and pt's need for supervision upon return home. Pt with impaired anticipatory awareness. Engaged in [facility] coordination of threading beads. Pt required increased time to thread then practiced translation of beads palm>finger tips with increased time and dropping them approx 40% of time. At end of session pt returned to room and left with all needs in reach.   Session 2: Pt seen for 1:1 OT session with focus on safety awareness, problem solving, and dynamic standing balance. Pt  received sitting in recliner chair. Engaged in simple meal prep task in kitchen. Therapist provided walker bag to assist with safely transporting items around home. Pt with good carryover of sliding items down counter however required min cues for positioning of RW when reaching into overhead cabinets. Pt with min cues for safety when transferring items from one counter to another. Pt initiating use of LUE when reaching into cabinets and sliding items down counter. Follow task pt reporting need to use bathroom and panicked. Pt incontinent d/t urgency. Returned to room and completed hygiene and changing of brief with supervision for standing balance. Pt left sitting in recliner chair with all needs in reach.   Therapy Documentation Precautions:  Precautions Precautions: Fall Precaution Comments: left sided weakness Restrictions Weight Bearing Restrictions: No General: General Missed Time Reason: Patient fatigue;Other (comment) (Fatigue, orthostatic hypotension (see vital signs);RN notified) Vital Signs: Therapy Vitals Pulse Rate: 74 BP: 146/76 mmHg Patient Position, if appropriate: Sitting Oxygen Therapy SpO2: 100 % O2 Device: None (Room air) Pain: Pt with no report of pain during therapy sessions.   See FIM for current functional status  Therapy/Group: Individual Therapy  Duayne Cal 03/15/2014, 12:15 PM

## 2014-03-15 NOTE — Progress Notes (Signed)
Subjective/Complaints: Occ L neck pain with certain movements Bowels and bladder doing well Review of Systems - Negative except Left side weak Objective: Vital Signs: Blood pressure 129/76, pulse 68, temperature 98.5 F (36.9 C), temperature source Oral, resp. rate 17, weight 75.8 kg (167 lb 1.7 oz), SpO2 96.00%. No results found. Results for orders placed during the hospital encounter of 03/08/14 (from the past 72 hour(s))  CREATININE, SERUM     Status: Abnormal   Collection Time    03/15/14  5:53 AM      Result Value Ref Range   Creatinine, Ser 0.87  0.50 - 1.10 mg/dL   GFR calc non Af Amer 59 (*) >90 mL/min   GFR calc Af Amer 68 (*) >90 mL/min   Comment: (NOTE)     The eGFR has been calculated using the CKD EPI equation.     This calculation has not been validated in all clinical situations.     eGFR's persistently <90 mL/min signify possible Chronic Kidney     Disease.     HEENT: normal and poor dentition Cardio: RRR and no murmur Resp: CTA B/L and unlabored GI: BS positive and non distended Extremity:  Pulses positive and No Edema Skin:   Intact Neuro: Alert/Oriented, Cranial Nerve II-XII normal, Normal Sensory and Abnormal Motor 5/5 RUE, 3-/5 LUE, 2-/5 B HF, Left Quad, TA/Gastroc, 4-/5 Distal RLE Musc/Skel:  Other absent L patella Gen NAD Neck:  No pain to palp or with ROM  Assessment/Plan: 1. Functional deficits secondary to Right Pontine infarct which require 3+ hours per day of interdisciplinary therapy in a comprehensive inpatient rehab setting. Physiatrist is providing close team supervision and 24 hour management of active medical problems listed below. Physiatrist and rehab team continue to assess barriers to discharge/monitor patient progress toward functional and medical goals.  FIM: FIM - Bathing Bathing Steps Patient Completed: Chest;Right Arm;Left Arm;Abdomen;Left upper leg;Right upper leg;Buttocks;Front perineal area Bathing: 4: Min-Patient completes 8-9  28f 10 parts or 75+ percent  FIM - Upper Body Dressing/Undressing Upper body dressing/undressing steps patient completed: Thread/unthread right sleeve of pullover shirt/dresss;Thread/unthread left sleeve of pullover shirt/dress;Pull shirt over trunk;Put head through opening of pull over shirt/dress Upper body dressing/undressing: 5: Set-up assist to: Obtain clothing/put away FIM - Lower Body Dressing/Undressing Lower body dressing/undressing steps patient completed: Thread/unthread right pants leg;Pull pants up/down;Thread/unthread left pants leg Lower body dressing/undressing: 4: Min-Patient completed 75 plus % of tasks  FIM - Toileting Toileting steps completed by patient: Adjust clothing prior to toileting;Performs perineal hygiene;Adjust clothing after toileting Toileting: 0: Activity did not occur  FIM - Radio producer Devices: Grab bars Toilet Transfers: 0-Activity did not occur  FIM - Control and instrumentation engineer Devices: Environmental consultant;Arm rests Bed/Chair Transfer: 4: Chair or W/C > Bed: Min A (steadying Pt. > 75%);4: Bed > Chair or W/C: Min A (steadying Pt. > 75%)  FIM - Locomotion: Wheelchair Distance: 150 Locomotion: Wheelchair: 5: Travels 150 ft or more: maneuvers on rugs and over door sills with supervision, cueing or coaxing FIM - Locomotion: Ambulation Locomotion: Ambulation Assistive Devices: Administrator Ambulation/Gait Assistance: 4: Min guard;4: Min assist Locomotion: Ambulation: 2: Travels 50 - 149 ft with minimal assistance (Pt.>75%)  Comprehension Comprehension Mode: Auditory Comprehension: 6-Follows complex conversation/direction: With extra time/assistive device  Expression Expression Mode: Verbal Expression: 5-Expresses complex 90% of the time/cues < 10% of the time  Social Interaction Social Interaction: 6-Interacts appropriately with others with medication or extra time (anti-anxiety,  antidepressant).  Problem  Solving Problem Solving: 5-Solves complex 90% of the time/cues < 10% of the time  Memory Memory: 6-More than reasonable amt of time  Medical Problem List and Plan:  1. Thrombotic right pontine infarct  2. DVT Prophylaxis/Anticoagulation: Subcutaneous Lovenox. Monitor platelet counts and any signs of bleeding  3. Pain Management/osteoarthritis: Hydrocodone as needed. Monitor with increased mobility. Neck pain improving 4. Neuropsych: This patient is capable of making decisions on her own behalf.  5. Dysphagia. Dysphagia 3 and thin liquids /followup speech therapy  6. Hypertension. Norvasc 10 mg daily, Tenormin 25 mg daily, lisinopril 5 mg daily. Monitor with increased mobility  7. Hypothyroidism. Synthroid. Latest TSH level I.686  8. Hyperlipidemia. Lipitor  LOS (Days) 7 A FACE TO FACE EVALUATION WAS PERFORMED  Alto Gandolfo E 03/15/2014, 7:40 AM

## 2014-03-15 NOTE — Progress Notes (Addendum)
Physical Therapy Session Note  Patient Details  Name: Shelia Marks MRN: [id number] Date of Birth: [birth year]  Today's Date: 03/15/2014 Time: [phone] and 1445-1530 Time Calculation (min): 53 min 45 min  Short Term Goals: Week 1:  PT Short Term Goal 1 (Week 1): Pt will consistently perform supine<>sit with supervision with HOB flat, no rails. PT Short Term Goal 2 (Week 1): Pt will perform bed<>chair transfer with close supervision. PT Short Term Goal 3 (Week 1): Pt will perform w/c mobility x150' in controlled environment with supervision. PT Short Term Goal 4 (Week 1): Pt will perform gait x25' in controlled environment with min A using LRAD. PT Short Term Goal 5 (Week 1): Pt will negotiate 2 stairs with L rail with mod A.  Skilled Therapeutic Interventions/Progress Updates:    Treatment Session 1: Pt received seated in bedside chair, finishing breakfast. Pt agreeable to session but reports feeling "not as good as I did yesterday," and "my head feels funny," per patient. Vitals taken in seated the standing. Seated BP 153/79, HR 74. Standing BP 145/92, HR 79. Pt reports dizziness, lightheadedness in standing. Dizziness resolved upon sitting. Sit<>stand transfers with rolling walker and close supervision, subtle cueing for safe hand placement. W/c mobility x100' (with single rest break, per pt request) in controlled environment with bilat UE's with supervision.   Gait x42' in controlled environment with rolling walker, min guard. Post-gait, systolic BP in seated is 623, in standing is 124 with symptoms as described above; see vital signs for detailed findings. After dizziness resolved, pt performed w/c mobility x45' in controlled environment with bilat LE's, supervision; w/c mobility trial ended per pt request secondary to fatigue. RN notified of all vital signs, pt symptoms, and significant decline in activity tolerance during current session as opposed to all previous sessions. PT recommended  that pt focus on drinking water. Therapist departed with pt seated in w/c with all needs within reach. Pt asymptomatic.  Treatment Session 2: Pt received seated in bedside chair; agreeable to therapy. Pt reports no dizziness or lightheadedness before or during session. Session focused on increasing gait stability, safety awareness with functional mobility. Pt performed w/c mobility x150' in controlled environment with bilat UE's/LE's with supervision, no rest breaks. Once in gym, orthotist, [place], present to evaluate pt gait with L short hinged knee orthosis with 10-degree extension stop. While wearing said orthosis, pt performed gait x150' in controlled environment with rolling walker and close supervision. Pt exhibits no episodes of L genu recurvatum in LLE mid to terminal stance. Pt reports perception of increased L knee stability during gait. L genu varum (premorbid, per pt) less prominent while pt wearing brace. Crossing Velcro straps over popliteal fossa successful in further preventing genu recurvatum. Orthotist to follow up with L hinged knee brace with 20-degree extension stop to evaluate change in gait mechanics. Doffed L knee orthosis and returned to pt room, where pt performed toilet transfer with rolling walker and min A. Pt continent of bladder; RN notified. Pt again attempted to perform stand pivot transfer without rolling walker or supervision/assistance. PT again educated pt on need for close supervision with transfers, gait at this time. Therapist also explained verbal recommendation that pt have supervision with all functional mobility secondary to ongoing safety awareness. Pt verbalized understanding. Therapist departed with pt seated in bedside chair with all needs within reach.  Addendum: Long-term goals downgraded to supervision secondary to impaired safety awareness consistently exhibited during PT session.s  Therapy Documentation Precautions:  Precautions Precautions:  Fall  Precaution Comments: left sided weakness Restrictions Weight Bearing Restrictions: No General: Amount of Missed PT Time (min): 7 Minutes Missed Time Reason: Patient fatigue;Other (comment) (Fatigue, orthostatic hypotension (see vital signs);RN notified) Vital Signs: Therapy Vitals Temp: 98.5 F (36.9 C) Temp src: Oral Pulse Rate: 74 Resp: 17 BP: 146/76 mmHg Patient Position, if appropriate: Sitting Oxygen Therapy SpO2: 100 % O2 Device: None (Room air) Pain: Pain Assessment Pain Assessment: No/denies pain Pain Score: 0-No pain Locomotion : Ambulation Ambulation/Gait Assistance: 4: Min guard Wheelchair Mobility Distance: 45   See FIM for current functional status  Therapy/Group: Individual Therapy  Hobble, Malva Cogan 03/15/2014, 8:56 AM

## 2014-03-15 NOTE — Plan of Care (Signed)
Problem: RH Balance Goal: LTG Patient will maintain dynamic standing with ADLs (OT) LTG: Patient will maintain dynamic standing balance with assist during activities of daily living (OT)  Downgraded goals to supervision secondary to patient's impaired safety awareness and memory.   Problem: RH Bathing Goal: LTG Patient will bathe with assist, cues/equipment (OT) LTG: Patient will bathe specified number of body parts with assist with/without cues using equipment (position) (OT)  Downgraded goals to supervision secondary to patient's impaired safety awareness and memory.   Problem: RH Dressing Goal: LTG Patient will perform lower body dressing w/assist (OT) LTG: Patient will perform lower body dressing with assist, with/without cues in positioning using equipment (OT)  Downgraded goals to supervision secondary to patient's impaired safety awareness and memory.   Problem: RH Toileting Goal: LTG Patient will perform toileting w/assist, cues/equip (OT) LTG: Patient will perform toiletiing (clothes management/hygiene) with assist, with/without cues using equipment (OT)  Downgraded goals to supervision secondary to patient's impaired safety awareness and memory.   Problem: RH Laundry Goal: LTG Patient will perform laundry w/assist, cues (OT) LTG: Patient will perform laundry with assistance, with/without cues (OT).  Downgraded goals to supervision secondary to patient's impaired safety awareness and memory.   Problem: RH Toilet Transfers Goal: LTG Patient will perform toilet transfers w/assist (OT) LTG: Patient will perform toilet transfers with assist, with/without cues using equipment (OT)  Downgraded goals to supervision secondary to patient's impaired safety awareness and memory.   Problem: RH Simple Meal Prep Goal: LTG Patient will perform simple meal prep w/assist (OT) LTG: Patient will perform simple meal prep with assistance, with/without cues (OT).  Downgraded goals to supervision  secondary to patient's impaired safety awareness and memory.

## 2014-03-16 ENCOUNTER — Inpatient Hospital Stay (HOSPITAL_COMMUNITY): Payer: Medicare Other

## 2014-03-16 ENCOUNTER — Inpatient Hospital Stay (HOSPITAL_COMMUNITY): Payer: Medicare Other | Admitting: Physical Therapy

## 2014-03-16 DIAGNOSIS — I6330 Cerebral infarction due to thrombosis of unspecified cerebral artery: Secondary | ICD-10-CM

## 2014-03-16 DIAGNOSIS — I69991 Dysphagia following unspecified cerebrovascular disease: Secondary | ICD-10-CM

## 2014-03-16 DIAGNOSIS — I69920 Aphasia following unspecified cerebrovascular disease: Secondary | ICD-10-CM

## 2014-03-16 DIAGNOSIS — I10 Essential (primary) hypertension: Secondary | ICD-10-CM

## 2014-03-16 MED ORDER — LISINOPRIL 2.5 MG PO TABS
2.5000 mg | ORAL_TABLET | Freq: Every day | ORAL | Status: DC
  Administered 2014-03-16 – 2014-03-19 (×4): 2.5 mg via ORAL
  Filled 2014-03-16 (×5): qty 1

## 2014-03-16 NOTE — Progress Notes (Signed)
Physical Therapy Weekly Progress Note  Patient Details  Name: Shelia Marks MRN: [id number] Date of Birth: Oct 08, 1926  Today's Date: 03/16/2014 Time: 0802-0900 and 0100-7121 Time Calculation (min): 58 min and 40 min  Patient has met 5 of 5 short term goals. Pt demonstrates improved stability/independence with bed mobility, functional mobility, gait, w/c mobility, and stair negotiation secondary to decreased dizziness, improved compensation for functional deficits, improved postural stability in sitting/standing.   Patient continues to demonstrate the following deficits: muscle weakness, decreased cardiorespiratory endurance, decreased standing balance, decreased postural control, hemiplegia and decreased balance strategies and therefore will continue to benefit from skilled PT intervention to enhance overall performance with activity tolerance, balance, postural control, ability to compensate for deficits, functional use of  left lower extremity and safety awareness.  Patient progressing toward long term goals..  Continue plan of care. Long term goal for stair negotiation modified to 4 stairs with bilat rails secondary to pt-reported home setup.   PT Short Term Goals Week 1:  PT Short Term Goal 1 (Week 1): Pt will consistently perform supine<>sit with supervision with HOB flat, no rails. PT Short Term Goal 1 - Progress (Week 1): Met PT Short Term Goal 2 (Week 1): Pt will perform bed<>chair transfer with close supervision. PT Short Term Goal 2 - Progress (Week 1): Met PT Short Term Goal 3 (Week 1): Pt will perform w/c mobility x150' in controlled environment with supervision. PT Short Term Goal 3 - Progress (Week 1): Met PT Short Term Goal 4 (Week 1): Pt will perform gait x25' in controlled environment with min A using LRAD. PT Short Term Goal 4 - Progress (Week 1): Met PT Short Term Goal 5 (Week 1): Pt will negotiate 2 stairs with L rail with mod A. PT Short Term Goal 5 - Progress (Week  1): Met Week 2:  PT Short Term Goal 1 (Week 2): STG's=LTG's secondary to ELOS  Skilled Therapeutic Interventions/Progress Updates:    Treatment Session 1: Pt received seated in bedside chair; agreeable to therapy. Session focused on LLE NMR, gait training, and functional transfers. Remained in pt room for session, per pt rest, as pt had not yet had B&D session. Educated pt on NMES for control of L genu recurvatum during gait; pt verbalized understanding. NMES at L hamstrings to prevent L genu recurvatum during pre-gait activities. Utilized setting for large muscle atrophy (single channel with electrodes placed at proximal and distal L hamstrings). Optimum gait pattern (active control of L genu recurvatum, L genu varum during LLE midstance) achieved with parameters at 17 mA; 1:3 duty cycle, 2-second ramp, 50 pps, x15 minutes total. Intensity increased to pt comfort. With bilat UE support at rolling walker, pt performed the following pre-gait activities with concurrent multimodal cueing focused on controlling L genu recurvatum: bilat weight shifting,  LLE weight shifting with initiation of RLE advancement, short-distance gait with rolling walker. After seated rest break, pt toilet transfer with rolling walker, min A. Pt incontinent of bladder. Brief changed. RN notified.   While standing at sink after washing hands, pt attempted to lean backwards and rotate trunk to R side to throw paper towel into trash can without UE support. Therapist educated pt on why this was an unsafe technique for throwing away paper towel. PT explained and demonstrated increased stability with turning body toward, positioning self more closely to trash can can decrease risk of fall. Pt verbally agreed but will likely need reinforcement. Therapist departed with pt seated in bedside chair with all needs  within reach.    Treatment Session 2: Pt received seated in bedside chair; agreeable to therapy. Orthotist, Gerald Stabs, present to fit L  knee orthosis with knee extension stop. Orthotist modified orthosis from -10 degrees extension block to -20 degrees extension block. After orthosis was donned, pt ambulated x150' in controlled environment with rolling walker and close supervision to min guard. L knee orthosis effectively controlled recurvatum throughout gait trial. After orthotist departed, performed gait 2x150' in controlled environment with rolling walker and min guard during initial trial, with rolling walker and min A during second trial due to pt fatigue, onset of L hip drop during LLE stance phase of gait. Negotiated 6 steps with bilat UE support at R rail (to simulate pt-reported primary entrance of home) pt facing diagonally with step-to pattern and mod A, mod verbal cueing for sequencing. When asked by this therapist, pt reports that back entrance of home has 3 steps with single R rail and front entrance of home has 4 steps to enter with 2 rails. Performed 6 steps with 2 rails, forward-facing with step-to pattern and min A, min verbal cueing for sequencing. Session ended in pt room, where pt was left seated in bedside chair with all needs within reach.   Therapy Documentation Precautions:  Precautions Precautions: Fall Precaution Comments: left sided weakness Restrictions Weight Bearing Restrictions: No Pain:  Pt reports no pain.  See FIM for current functional status  Therapy/Group: Individual Therapy  Hobble, Malva Cogan 03/16/2014, 1:15 PM

## 2014-03-16 NOTE — Progress Notes (Signed)
Social Work Patient ID: Shelia Marks, female   DOB: 01-25-1926, 80 y.o.   MRN: [id number] Spoke with sister to discuss concerns regarding pt's care.  Will address in family education tomorrow.  She can not provide physical care of pt. Team is reporting she will be supervision level not physical care.  Discussed options of NHP or hired assist.  She does not want pt to go to a NH. Will see how education goes tomorrow with sister.  Sister is aware of the team's recommendation of supervision level now.

## 2014-03-16 NOTE — Progress Notes (Addendum)
Occupational Therapy Weekly Progress Note  Patient Details  Name: Shelia Marks MRN: [id number] Date of Birth: 01-03-1926  Today's Date: 03/16/2014 Time: 2993-7169 and 1345-1420 Time calculation (min): 41 min and 35 min    Patient has met 4 of 4 short term goals.  Patient is progressing well in rehab. She is currently at a min assist level for LB dressing and supervision overall for functional transfers and self-care tasks. Patient's main barriers are impaired safety awareness and problem solving skills, therefore downgrading goals to supervision.   Patient continues to demonstrate the following deficits: decreased standing balance, L hemiplegia, decreased safety awareness, decreased coordination, decreased overall awareness, decreased memory, decreased balance reactions, decreased ability to compensate for deficits and therefore will continue to benefit from skilled OT intervention to enhance overall performance with BADL and ability to compensate for deficits, fine motor coordination, strengthening, activity tolerance, standing balance.  Patient not progressing toward long term goals.  See goal revision..  Continue plan of care. Patinet's LB dressing goal were downgraded to setup assist d/t unable to don knee brace and toilet transfer goal downgraded to supervision level secondary to decreased safety awareness, impaired memory, and overall awareness deficits.   OT Short Term Goals Week 1:  OT Short Term Goal 1 (Week 1): Patient will complete toilet transfer at supervision level OT Short Term Goal 1 - Progress (Week 1): Met OT Short Term Goal 2 (Week 1): Patient will complete LB dressing at min assist for standing balance OT Short Term Goal 2 - Progress (Week 1): Met OT Short Term Goal 3 (Week 1): Patient will complete 1 grooming task in standing with min assist OT Short Term Goal 3 - Progress (Week 1): Met OT Short Term Goal 4 (Week 1): Pt will demonstrate sustained attention to self-care  task for 4 min with min cues OT Short Term Goal 4 - Progress (Week 1): Met Week 2:  OT Short Term Goal 1 (Week 2): Focus on LTGs  Skilled Therapeutic Interventions/Progress Updates:    Session 1: Pt seen for ADL retraining with focus on attention to task, standing balance, functional transfers, and safety awareness. Pt received in recliner chair. Ambulated to bathroom with RW at supervision level to practice toilet transfer. Completed bathing from w/c at sink with pt demonstrating selective attention this AM. Pt with min cues for problem solving during LB dressing for propping BLEs on stool and use of LH shoe horn. Pt declining purchasing LH shoe horn, stating "I just don't need shoes." Provided education but pt continued to demonstrate poor awareness. Present pt with spoon to trial as LH shoe horn and successful with donning R shoe however swelling noted in LLE impacting ability to don shoe. Pt agreeable to using spoon at home as [place] shoe horn. Pt with min verbal cues for safety awareness throughout bathing and dressing as she attempted to stand and take a couple steps away from w/c and RW for clothing management. Provided education on repositioning w/c prior to sit<>stand and increased carryover noted in this session. At end of session pt left sitting in w/c with all needs in reach.   Session 2: Pt seen for 1:1 OT session with focus on LB dressing, standing balance, activity tolerance, and safety awareness. Pt received sitting in recliner chair following PT session and reporting being "tired." Required min cues of encouragement for participation. Practiced donning knee brace and shoes using LH shoe horn that was given to her. Pt required min cues and min assist for correctly  donning knee brace and able to don shoes with min cues for use of shoe horn. Pt ambulated to bathroom at supervision level requiring cue for use of RW. Ambulated to sink to complete hand hygiene in standing with carryover of positioning  of RW at sink. Pt sat for rest break then engaged in dynamic balance task of cleaning room. Pt ambulating to dresser and clearing off top of it to put items away as she stated it was "to cluttered." Pt with supervision for standing balance during this task. Pt returned to recliner chair and reporting feeling tired and declining more therapy. Therapist encouraged participation from recliner in room however pt continuing to decline. Pt left sitting in recliner chair with all needs in reach.   Therapy Documentation Precautions:  Precautions Precautions: Fall Precaution Comments: left sided weakness Restrictions Weight Bearing Restrictions: No General:   Vital Signs: Therapy Vitals Temp: 98.5 F (36.9 C) Temp src: Oral Pulse Rate: 74 Resp: 18 BP: 158/81 mmHg Patient Position, if appropriate: Sitting Oxygen Therapy SpO2: 100 % O2 Device: None (Room air) Pain: No report of pain during therapy session.   See FIM for current functional status  Therapy/Group: Individual Therapy  Duayne Cal 03/16/2014, 7:26 AM

## 2014-03-16 NOTE — Progress Notes (Signed)
Subjective/Complaints: Felt a little dizzy yesterday Bowels and bladder doing well Review of Systems - Negative except Left side weak Objective: Vital Signs: Blood pressure 158/81, pulse 74, temperature 98.5 F (36.9 C), temperature source Oral, resp. rate 18, weight 75.8 kg (167 lb 1.7 oz), SpO2 100.00%. No results found. Results for orders placed during the hospital encounter of 03/08/14 (from the past 72 hour(s))  CREATININE, SERUM     Status: Abnormal   Collection Time    03/15/14  5:53 AM      Result Value Ref Range   Creatinine, Ser 0.87  0.50 - 1.10 mg/dL   GFR calc non Af Amer 59 (*) >90 mL/min   GFR calc Af Amer 68 (*) >90 mL/min   Comment: (NOTE)     The eGFR has been calculated using the CKD EPI equation.     This calculation has not been validated in all clinical situations.     eGFR's persistently <90 mL/min signify possible Chronic Kidney     Disease.  GLUCOSE, CAPILLARY     Status: Abnormal   Collection Time    03/15/14  9:32 PM      Result Value Ref Range   Glucose-Capillary 134 (*) 70 - 99 mg/dL     HEENT: normal and poor dentition Cardio: RRR and no murmur Resp: CTA B/L and unlabored GI: BS positive and non distended Extremity:  Pulses positive and No Edema Skin:   Intact Neuro: Alert/Oriented, Cranial Nerve II-XII normal, Normal Sensory and Abnormal Motor 5/5 RUE, 3-/5 LUE, 2-/5 B HF, Left Quad, TA/Gastroc, 4-/5 Distal RLE Musc/Skel:  Other absent L patella Gen NAD Neck:  No pain to palp or with ROM  Assessment/Plan: 1. Functional deficits secondary to Right Pontine infarct which require 3+ hours per day of interdisciplinary therapy in a comprehensive inpatient rehab setting. Physiatrist is providing close team supervision and 24 hour management of active medical problems listed below. Physiatrist and rehab team continue to assess barriers to discharge/monitor patient progress toward functional and medical goals.  FIM: FIM - Bathing Bathing Steps  Patient Completed: Chest;Right Arm;Left Arm;Abdomen;Left upper leg;Right upper leg;Buttocks;Front perineal area Bathing: 4: Min-Patient completes 8-9 [id number] parts or 75+ percent  FIM - Upper Body Dressing/Undressing Upper body dressing/undressing steps patient completed: Thread/unthread right sleeve of pullover shirt/dresss;Thread/unthread left sleeve of pullover shirt/dress;Pull shirt over trunk;Put head through opening of pull over shirt/dress Upper body dressing/undressing: 5: Supervision: Safety issues/verbal cues FIM - Lower Body Dressing/Undressing Lower body dressing/undressing steps patient completed: Thread/unthread right pants leg;Pull pants up/down;Thread/unthread left pants leg;Thread/unthread right underwear leg;Pull underwear up/down;Don/Doff right shoe Lower body dressing/undressing: 4: Min-Patient completed 75 plus % of tasks  FIM - Toileting Toileting steps completed by patient: Adjust clothing prior to toileting;Performs perineal hygiene;Adjust clothing after toileting Toileting: 0: Activity did not occur  FIM - TRadio producerDevices: Grab bars;Walker Toilet Transfers: 4-To toilet/BSC: Min A (steadying Pt. > 75%);4-From toilet/BSC: Min A (steadying Pt. > 75%)  FIM - BControl and instrumentation engineerDevices: Walker;Arm rests Bed/Chair Transfer: 5: Chair or W/C > Bed: Supervision (verbal cues/safety issues);5: Bed > Chair or W/C: Supervision (verbal cues/safety issues) (close supervision)  FIM - Locomotion: Wheelchair Distance: 150 Locomotion: Wheelchair: 5: Travels 150 ft or more: maneuvers on rugs and over door sills with supervision, cueing or coaxing FIM - Locomotion: Ambulation Locomotion: Ambulation Assistive Devices: Orthosis;Walker - Rolling (L hinged knee orthosis) Ambulation/Gait Assistance: 5: Supervision (close supervision) Locomotion: Ambulation: 5: Travels 150 ft or more  with supervision/safety issues (close  supervision)  Comprehension Comprehension Mode: Auditory Comprehension: 6-Follows complex conversation/direction: With extra time/assistive device  Expression Expression Mode: Verbal Expression: 5-Expresses complex 90% of the time/cues < 10% of the time  Social Interaction Social Interaction: 5-Interacts appropriately 90% of the time - Needs monitoring or encouragement for participation or interaction.  Problem Solving Problem Solving: 5-Solves complex 90% of the time/cues < 10% of the time  Memory Memory: 6-More than reasonable amt of time  Medical Problem List and Plan:  1. Thrombotic right pontine infarct  2. DVT Prophylaxis/Anticoagulation: Subcutaneous Lovenox. Monitor platelet counts and any signs of bleeding  3. Pain Management/osteoarthritis: Hydrocodone as needed. Monitor with increased mobility. Neck pain improving 4. Neuropsych: This patient is capable of making decisions on her own behalf.  5. Dysphagia. Dysphagia 3 and thin liquids /followup speech therapy  6. Hypertension. Norvasc 10 mg daily, Tenormin 25 mg daily, lisinopril 5 mg daily. Orthostatic changes from sit to stand documented 3/17 7. Hypothyroidism. Synthroid. Latest TSH level I.686  8. Hyperlipidemia. Lipitor  LOS (Days) 8 A FACE TO FACE EVALUATION WAS PERFORMED  Shelia Marks E 03/16/2014, 7:23 AM

## 2014-03-17 ENCOUNTER — Inpatient Hospital Stay (HOSPITAL_COMMUNITY): Payer: Medicare Other | Admitting: Physical Therapy

## 2014-03-17 ENCOUNTER — Encounter (HOSPITAL_COMMUNITY): Payer: Medicare Other

## 2014-03-17 ENCOUNTER — Inpatient Hospital Stay (HOSPITAL_COMMUNITY): Payer: Medicare Other

## 2014-03-17 MED ORDER — SENNOSIDES-DOCUSATE SODIUM 8.6-50 MG PO TABS
2.0000 | ORAL_TABLET | Freq: Two times a day (BID) | ORAL | Status: DC
  Administered 2014-03-17 – 2014-03-19 (×3): 2 via ORAL
  Filled 2014-03-17 (×5): qty 2

## 2014-03-17 NOTE — Progress Notes (Signed)
Occupational Therapy Session Note  Patient Details  Name: Shelia Marks MRN: [id number] Date of Birth: [birth year]  Today's Date: 03/17/2014 Time: 1324-4010 and 2725-3664  Time Calculation (min): 40 min and 43 min   Short Term Goals: Week 2:  OT Short Term Goal 1 (Week 2): Focus on LTGs  Skilled Therapeutic Interventions/Progress Updates:    Session 1: Pt seen for ADL retraining with focus on safety awareness, dynamic standing balance, and problem solving. Pt received in recliner chair. Ambulated to dresser to retrieve items with min cues for positioning of RW when reaching into drawers. Completed bathing at supervision level with min cues for locking w/c breaks prior to standing. Pt with increased time for UB and LB dressing and min cues for problem solving for crossover technique for threading BLEs into pants. Practiced donning/doffing knee orthosis. Pt requires assist to don orthosis correctly and will educate sister on this. Pt utilized shoe horn with increased time to don shoes. At end of session, pt ambulated to recliner and left with all needs in reach.   Session 2: Pt seen for 1:1 OT session with focus on safety awareness, UE strengthening, activity tolerance, and standing balance. Pt received sitting in recliner chair. Required assistance for readjusting knee orthosis then transferred to w/c at supervision level. Practiced community integration activity in hospital gift shop with focus on safety awareness and attention to right. Pt ambulated throughout small gift shop at supervision level for min cues for safety with being aware of thresholds in floor. Pt with good awareness of others and items in isles. Pt then participated in UE and core strengthening exercises using dowel bar. Pt required min rest breaks. Pt's sister present at end of session. Discussed supervision level goals due to pt's decreased safety awareness. Provided several examples and sister acknowledging therapist however  little communication after this. Therapist encouraged questions however, sister reported understanding and no concerns about discharge at this time. Pt left sitting in w/c with all needs in reach.   Therapy Documentation Precautions:  Precautions Precautions: Fall Precaution Comments: left sided weakness Restrictions Weight Bearing Restrictions: Yes General:   Vital Signs: Therapy Vitals Pulse Rate: 69 BP: 148/80 mmHg Patient Position, if appropriate: Sitting Pain: No report of pain during therapy session.   See FIM for current functional status  Therapy/Group: Individual Therapy  Duayne Cal 03/17/2014, 10:29 AM

## 2014-03-17 NOTE — Patient Care Conference (Signed)
Inpatient RehabilitationTeam Conference and Plan of Care Update Date: 03/17/2014   Time: 11;00 AM    Patient Name: Shelia Marks      Medical Record Number: [id number]  Date of Birth: 03-20-1926 Sex: Female         Room/Bed: 4M10C/4M10C-01 Payor Info: Payor: MEDICARE / Plan: MEDICARE PART A AND B / Product Type: *No Product type* /    Admitting Diagnosis: R CVA  Admit Date/Time:  03/08/2014  6:55 PM Admission Comments: No comment available   Primary Diagnosis:  <principal problem not specified> Principal Problem: <principal problem not specified>  Patient Active Problem List   Diagnosis Date Noted  . 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 CVA (cerebral vascular accident) 03/05/2014  . CVA (cerebral infarction) 03/05/2014  . Hypothyroidism   . Hypertension   . Senile osteoporosis   . Hyperlipidemia LDL goal < 100   . Alzheimer's disease     Expected Discharge Date: Expected Discharge Date: 03/19/14  Team Members Present: Physician leading conference: Dr. Alysia Penna Social Worker Present: Ovidio Kin, LCSW;Jenny Prevatt, LCSW Nurse Present: Dorthula Nettles, RN PT Present: Billie Ruddy, PT OT Present: Gareth Morgan, Starling Manns, OT SLP Present: Germain Osgood, SLP PPS Coordinator present : Daiva Nakayama, RN, CRRN     Current Status/Progress Goal Weekly Team Focus  Medical   still needs sup/min  upgrade to sup/modI  family training   Bowel/Bladder   Pt continent of bowel and bladder LBM 03/16/14  remain continent of bowel and bladder  continue to monitor q shift   Swallow/Nutrition/ Hydration     WFL        ADL's   min assist LB dressing, supervision bathing, UB dressing, toilet task and functional transfers  supervision/setup LB dressing and toilet transfer, Mod I UB dressing, bathing, toilet task, and grooming  functional transfers, safety awareness, L NMR, Thor, strengthening, pt/family education, dynamic standing balance, activity tolerance    Mobility   Supervision to bed mobility and transfers; close supervision-Min A with gait; Min-Mod A with stair negotiation  Mod I overall, with exception of supervision with stair negotiation  tranfser/gait training, management of L knee orthosis, stair negotiation, safety awareness, family education/training, dynamic standing balance, D/C planning   Communication     [facility]        Safety/Cognition/ Behavioral Observations  no unsafe behavior  min assist  continue to monitor q shift   Pain   no complaints of pain  remain free of pain  continue to monitor q shift   Skin   no current skin breakdown  remain free of skin breakdown  assess skin q shift and prn for sign of new skin breakdown    Rehab Goals Patient on target to meet rehab goals: Yes *See Care Plan and progress notes for long and short-term goals.  Barriers to Discharge: pt not open to much help from family    Possible Resolutions to Barriers:  SW to address    Discharge Planning/Teaching Needs:  Home with sister who can only provide supervision level.  Family education today      Team Discussion:  Reaching goals sooner-family education today with sister.  Brace this am helps her with knee.    Revisions to Treatment Plan:  Moving up dc to Friday meeting goals sooner.  Downgraded goals to supervision level   Continued Need for Acute Rehabilitation Level of Care: The patient requires daily medical management by a physician with specialized training in physical medicine and rehabilitation for the  following conditions: Daily direction of a multidisciplinary physical rehabilitation program to ensure safe treatment while eliciting the highest outcome that is of practical value to the patient.: Yes Daily medical management of patient stability for increased activity during participation in an intensive rehabilitation regime.: Yes Daily analysis of laboratory values and/or radiology reports with any subsequent need for medication  adjustment of medical intervention for : Neurological problems  [place] Voytko, Gardiner Rhyme 03/17/2014, 3:19 PM

## 2014-03-17 NOTE — Progress Notes (Signed)
Physical Therapy Session Note  Patient Details  Name: Shelia Marks MRN: [id number] Date of Birth: September 27, 1926  Today's Date: 03/17/2014 Time: 4827-0786 and 7544-9201 Time Calculation (min): 42 min and 57 min  Short Term Goals: Week 2:  PT Short Term Goal 1 (Week 2): STG's=LTG's secondary to ELOS  Skilled Therapeutic Interventions/Progress Updates:    Treatment Session 1: Pt received seated in bedside chair; agreeable to session. Session focused on facilitating pt independence donning L knee orthosis, initiation of car transfers. Pt initially required max A to don L knee orthosis in seated. Pt then donned 3 orthosis additional times prior to performing ~75% of donning without assist. Transitioned to gait 2x150' in controlled environment with rolling walker and close supervision to min A. With increased fatigue, pt demonstrates increasingly more prominent L genu valgum and L hip drop in LLE midstance, decreased LLE stance time, limited RLE step length. Manual facilitation at L axilla, R ribcage to promote upright posture (focus on hip extension) effective in minimizing pelvic and LLE movement in sagittal plane. Therapist explained and demonstrated car transfer x2 with rolling walker, initially with min A, then with supervision/cueing for safe hand placement. Session ended in pt room, where pt was left seated in bedside chair with all needs within reach.   Treatment Session 2: Pt received seated in bedside chair with sister, [name], and friend, Danton Clap, present for hands-on family training. Therapist educated pt and caregivers on the following: necessity of L knee orthosis, rolling walker, and supervision for all standing, transfers, and walking; rationale for L knee orthosis. Pt and caregivers verbalized understanding. Pt donned L knee orthosis with min A. Per conversation with caregivers, pt is unable to safely access front door of home secondary to steep hill in front of home. Therefore, pt must  utilize back entrance of home, which has 2 steps to enter with single R rail.   Therapist then explained and demonstrated all of the following: gait x150' in controlled environment with rolling walker and supervision; car transfer with rolling walker and supervision; negotiation of 3 steps with R rail, forward-facing with step-to pattern and min A for L HHA; mod cueing for sequencing, technique. Sister and friend each performed each described aspect of functional mobility with appropriate supervision/assist and cueing.  Returned to room via gait x150' in controlled environment with rolling walker and supervision of sister. Once in room, performed toilet transfer with rolling walker, grab bars, and supervision. Therapist reiterated importance of supervision with toilet transfer. Sister and friend verbalized understanding. RN notified that pt was continent of bladder. Therapist departed with pt seated in bedside chair with sister and friend present and all needs within reach.   Therapy Documentation Precautions:  Precautions Precautions: Fall Precaution Comments: left sided weakness Restrictions Weight Bearing Restrictions: No Vital Signs: Therapy Vitals Temp: 98.1 F (36.7 C) Temp src: Oral Pulse Rate: 66 BP: 128/70 mmHg Patient Position, if appropriate: Sitting Pain:  Pt reports no pain during AM/PM sessions.  Locomotion : Ambulation Ambulation/Gait Assistance: 5: Supervision   See FIM for current functional status  Therapy/Group: Individual Therapy  Hobble, Malva Cogan 03/17/2014, 4:16 PM

## 2014-03-17 NOTE — Progress Notes (Signed)
Social Work Patient ID: Shelia Marks, female   DOB: 07/07/1926, 80 y.o.   MRN: [id number] Met with pt and sister who is here for family education and informed of team conference moved up discharge date to 3/20. Sister informed she will be staying some with her and her with sister.  Will arrange follow up and any DME.  Sister agreeable to discharge date.

## 2014-03-17 NOTE — Progress Notes (Signed)
Subjective/Complaints:  Bowels not moving and bladder doing well Review of Systems - Negative except Left side weak Objective: Vital Signs: Blood pressure 148/80, pulse 69, temperature 98.5 F (36.9 C), temperature source Oral, resp. rate 18, weight 75.8 kg (167 lb 1.7 oz), SpO2 97.00%. No results found. Results for orders placed during the hospital encounter of 03/08/14 (from the past 72 hour(s))  CREATININE, SERUM     Status: Abnormal   Collection Time    03/15/14  5:53 AM      Result Value Ref Range   Creatinine, Ser 0.87  0.50 - 1.10 mg/dL   GFR calc non Af Amer 59 (*) >90 mL/min   GFR calc Af Amer 68 (*) >90 mL/min   Comment: (NOTE)     The eGFR has been calculated using the CKD EPI equation.     This calculation has not been validated in all clinical situations.     eGFR's persistently <90 mL/min signify possible Chronic Kidney     Disease.  GLUCOSE, CAPILLARY     Status: Abnormal   Collection Time    03/15/14  9:32 PM      Result Value Ref Range   Glucose-Capillary 134 (*) 70 - 99 mg/dL     HEENT: normal and poor dentition Cardio: RRR and no murmur Resp: CTA B/L and unlabored GI: BS positive and non distended Extremity:  Pulses positive and No Edema Skin:   Intact Neuro: Alert/Oriented, Cranial Nerve II-XII normal, Normal Sensory and Abnormal Motor 5/5 RUE, 3-/5 LUE, 2-/5 B HF, Left Quad, TA/Gastroc, 4-/5 Distal RLE Musc/Skel:  Other absent L patella Gen NAD Neck:  No pain to palp or with ROM  Assessment/Plan: 1. Functional deficits secondary to Right Pontine infarct which require 3+ hours per day of interdisciplinary therapy in a comprehensive inpatient rehab setting. Physiatrist is providing close team supervision and 24 hour management of active medical problems listed below. Physiatrist and rehab team continue to assess barriers to discharge/monitor patient progress toward functional and medical goals.  FIM: FIM - Bathing Bathing Steps Patient Completed:  Chest;Right Arm;Left Arm;Abdomen;Left upper leg;Right upper leg;Buttocks;Front perineal area;Left lower leg (including foot);Right lower leg (including foot) Bathing: 5: Set-up assist to: Obtain items  FIM - Upper Body Dressing/Undressing Upper body dressing/undressing steps patient completed: Thread/unthread right sleeve of pullover shirt/dresss;Thread/unthread left sleeve of pullover shirt/dress;Pull shirt over trunk;Put head through opening of pull over shirt/dress Upper body dressing/undressing: 5: Set-up assist to: Obtain clothing/put away FIM - Lower Body Dressing/Undressing Lower body dressing/undressing steps patient completed: Thread/unthread right pants leg;Pull pants up/down;Thread/unthread left pants leg;Thread/unthread right underwear leg;Pull underwear up/down;Don/Doff right shoe Lower body dressing/undressing: 4: Min-Patient completed 75 plus % of tasks  FIM - Toileting Toileting steps completed by patient: Adjust clothing prior to toileting;Performs perineal hygiene;Adjust clothing after toileting Toileting Assistive Devices: Grab bar or rail for support Toileting: 5: Supervision: Safety issues/verbal cues  FIM - Radio producer Devices: Grab bars;Walker Toilet Transfers: 5-To toilet/BSC: Supervision (verbal cues/safety issues);5-From toilet/BSC: Supervision (verbal cues/safety issues)  FIM - Engineer, site Assistive Devices: Arm rests;Walker;Orthosis (L knee orthosis) Bed/Chair Transfer: 5: Chair or W/C > Bed: Supervision (verbal cues/safety issues);5: Bed > Chair or W/C: Supervision (verbal cues/safety issues)  FIM - Locomotion: Wheelchair Distance: 150 Locomotion: Wheelchair: 0: Activity did not occur FIM - Locomotion: Ambulation Locomotion: Ambulation Assistive Devices: Walker - Rolling;Other (comment) (L knee orthosis) Ambulation/Gait Assistance: 4: Min guard Locomotion: Ambulation: 4: Travels 150 ft or more with  minimal assistance (  Pt.>75%)  Comprehension Comprehension Mode: Auditory Comprehension: 6-Follows complex conversation/direction: With extra time/assistive device  Expression Expression Mode: Verbal Expression: 5-Expresses complex 90% of the time/cues < 10% of the time  Social Interaction Social Interaction: 5-Interacts appropriately 90% of the time - Needs monitoring or encouragement for participation or interaction.  Problem Solving Problem Solving: 5-Solves basic 90% of the time/requires cueing < 10% of the time  Memory Memory: 6-More than reasonable amt of time  Medical Problem List and Plan:  1. Thrombotic right pontine infarct  2. DVT Prophylaxis/Anticoagulation: Subcutaneous Lovenox. Monitor platelet counts and any signs of bleeding  3. Pain Management/osteoarthritis: Hydrocodone as needed. Monitor with increased mobility. Neck pain improving 4. Neuropsych: This patient is capable of making decisions on her own behalf.  5. Dysphagia. Dysphagia 3 and thin liquids /followup speech therapy  6. Hypertension. Norvasc 10 mg daily, Tenormin 25 mg daily, lisinopril 5 mg daily. Orthostatic changes from sit to stand documented 3/17 7. Hypothyroidism. Synthroid. Latest TSH level I.686  8. Hyperlipidemia. Lipitor  LOS (Days) 9 A FACE TO FACE EVALUATION WAS PERFORMED  KIRSTEINS,ANDREW E 03/17/2014, 8:08 AM

## 2014-03-17 NOTE — Progress Notes (Signed)
Social Work Elease Hashimoto, LCSW Social Worker Signed  Patient Care Conference Service date: 03/17/2014 3:18 PM  Inpatient RehabilitationTeam Conference and Plan of Care Update Date: 03/17/2014   Time: 11;00 AM     Patient Name: Shelia Marks       Medical Record Number: [id number]   Date of Birth: 10/12/1926 Sex: Female         Room/Bed: 4M10C/4M10C-01 Payor Info: Payor: MEDICARE / Plan: MEDICARE PART A AND B / Product Type: *No Product type* /   Admitting Diagnosis: R CVA   Admit Date/Time:  03/08/2014  6:55 PM Admission Comments: No comment available   Primary Diagnosis:  <principal problem not specified> Principal Problem: <principal problem not specified>    Patient Active Problem List     Diagnosis  Date Noted   .  Thrombotic cerebral infarction  03/08/2014   .  CVA (cerebral vascular accident)  03/05/2014   .  CVA (cerebral infarction)  03/05/2014   .  Hypothyroidism     .  Hypertension     .  Senile osteoporosis     .  Hyperlipidemia LDL goal < 100     .  Alzheimer's disease       Expected Discharge Date: Expected Discharge Date: 03/19/14  Team Members Present: Physician leading conference: Dr. Alysia Penna Social Worker Present: Ovidio Kin, LCSW;Jenny Prevatt, LCSW Nurse Present: Dorthula Nettles, RN PT Present: Billie Ruddy, PT OT Present: Gareth Morgan, Starling Manns, OT SLP Present: Germain Osgood, SLP PPS Coordinator present : Daiva Nakayama, RN, CRRN        Current Status/Progress  Goal  Weekly Team Focus   Medical     still needs sup/min  upgrade to sup/modI  family training   Bowel/Bladder     Pt continent of bowel and bladder LBM 03/16/14  remain continent of bowel and bladder  continue to monitor q shift   Swallow/Nutrition/ Hydration     WFL       ADL's     min assist LB dressing, supervision bathing, UB dressing, toilet task and functional transfers  supervision/setup LB dressing and toilet transfer, Mod I UB dressing, bathing, toilet  task, and grooming  functional transfers, safety awareness, L NMR, [place], strengthening, pt/family education, dynamic standing balance, activity tolerance   Mobility     Supervision to bed mobility and transfers; close supervision-Min A with gait; Min-Mod A with stair negotiation  Mod I overall, with exception of supervision with stair negotiation  tranfser/gait training, management of L knee orthosis, stair negotiation, safety awareness, family education/training, dynamic standing balance, D/C planning   Communication     [facility]       Safety/Cognition/ Behavioral Observations    no unsafe behavior  min assist  continue to monitor q shift   Pain     no complaints of pain  remain free of pain  continue to monitor q shift   Skin     no current skin breakdown  remain free of skin breakdown  assess skin q shift and prn for sign of new skin breakdown    Rehab Goals Patient on target to meet rehab goals: Yes *See Care Plan and progress notes for long and short-term goals.    Barriers to Discharge:  pt not open to much help from family      Possible Resolutions to Barriers:    SW to address      Discharge Planning/Teaching Needs:    Home with sister who can only  provide supervision level.  Family education today      Team Discussion:    Reaching goals sooner-family education today with sister.  Brace this am helps her with knee.     Revisions to Treatment Plan:    Moving up dc to Friday meeting goals sooner.  Downgraded goals to supervision level    Continued Need for Acute Rehabilitation Level of Care: The patient requires daily medical management by a physician with specialized training in physical medicine and rehabilitation for the following conditions: Daily direction of a multidisciplinary physical rehabilitation program to ensure safe treatment while eliciting the highest outcome that is of practical value to the patient.: Yes Daily medical management of patient stability for increased  activity during participation in an intensive rehabilitation regime.: Yes Daily analysis of laboratory values and/or radiology reports with any subsequent need for medication adjustment of medical intervention for : Neurological problems  Elease Hashimoto 03/17/2014, 3:19 PM         Elease Hashimoto, [place] Social Worker Signed  Patient Care Conference Service date: 03/10/2014 1:28 PM  Inpatient RehabilitationTeam Conference and Plan of Care Update Date: 03/10/2014   Time: 11:30 AM     Patient Name: Shelia Marks       Medical Record Number: [id number]   Date of Birth: October 10, 1926 Sex: Female         Room/Bed: 4M10C/4M10C-01 Payor Info: Payor: MEDICARE / Plan: MEDICARE PART A AND B / Product Type: *No Product type* /   Admitting Diagnosis: R CVA   Admit Date/Time:  03/08/2014  6:55 PM Admission Comments: No comment available   Primary Diagnosis:  <principal problem not specified> Principal Problem: <principal problem not specified>    Patient Active Problem List     Diagnosis  Date Noted   .  Thrombotic cerebral infarction  03/08/2014   .  CVA (cerebral vascular accident)  03/05/2014   .  CVA (cerebral infarction)  03/05/2014   .  Hypothyroidism     .  Hypertension     .  Senile osteoporosis     .  Hyperlipidemia LDL goal < 100     .  Alzheimer's disease       Expected Discharge Date: Expected Discharge Date: 03/23/14  Team Members Present: Physician leading conference: Dr. Alysia Penna Social Worker Present: Ovidio Kin, LCSW;Jenny Prevatt, LCSW Nurse Present: Dorthula Nettles, RN PT Present: Georjean Mode, PT;Blair Hobble, PT OT Present: Gareth Morgan, Lorelee Cover, OT PPS Coordinator present : Ileana Ladd, Lelan Pons, RN, CRRN        Current Status/Progress  Goal  Weekly Team Focus   Medical     participation is good, constipated  improve bowel program  med management and diet   Bowel/Bladder     Pt continent of  bowel and bladder  remain continent  of bowel and bladder  maintain q1-2hr rounding for toileting and PRN   Swallow/Nutrition/ Hydration     [facility]       ADL's     min assist UB dressing, mod assist bathing and LB dressing, min assist functional transfers  Mod I overall  functional transfers, sit<>stand, L NMR, FM coordination, strengthening, activity tolerance,   Mobility     Min-Mod A with bed mobility and transfers; Mod A with gait; +2A with stair negotiation  Mod I overall, with exception of supervision with stair negotiation  bed mobility, transfers, w/c mobility, gait/stair training, functional endurance, L NMR, standing balance.   Communication  Safety/Cognition/ Behavioral Observations    No unsafe behaviors       Pain     No complaints of pain  Remain free of pain     Skin     No current skin breakdown   Remain free of skin breakdown  assess skin q shift and PRN for signs of new skin breakdown     *See Care Plan and progress notes for long and short-term goals.    Barriers to Discharge:  lives alone      Possible Resolutions to Barriers:    needs Mod I level      Discharge Planning/Teaching Needs:    Home with sister assisting her, unsure if going to her home or sister;s.  Wants to be as independent as possible      Team Discussion:    Vestibular eval-adjusting BP meds.  Making good progress-recovering from her stroke.  Sister to assist at discharge   Revisions to Treatment Plan:    none    Continued Need for Acute Rehabilitation Level of Care: The patient requires daily medical management by a physician with specialized training in physical medicine and rehabilitation for the following conditions: Daily direction of a multidisciplinary physical rehabilitation program to ensure safe treatment while eliciting the highest outcome that is of practical value to the patient.: Yes Daily analysis of laboratory values and/or radiology reports with any subsequent need for medication adjustment of medical  intervention for : Neurological problems;Other  Elease Hashimoto 03/11/2014, 8:42 AM          Patient ID: [name], female   DOB: 12-23-1926, 80 y.o.   MRN: [id number]

## 2014-03-18 ENCOUNTER — Inpatient Hospital Stay (HOSPITAL_COMMUNITY): Payer: Medicare Other

## 2014-03-18 ENCOUNTER — Inpatient Hospital Stay (HOSPITAL_COMMUNITY): Payer: Medicare Other | Admitting: Physical Therapy

## 2014-03-18 MED ORDER — DIPHENHYDRAMINE-ZINC ACETATE 2-0.1 % EX CREA
TOPICAL_CREAM | Freq: Two times a day (BID) | CUTANEOUS | Status: DC | PRN
  Administered 2014-03-18: 10:00:00 via TOPICAL
  Filled 2014-03-18: qty 28

## 2014-03-18 NOTE — Discharge Summary (Signed)
Discharge summary job [id number]

## 2014-03-18 NOTE — Discharge Instructions (Signed)
Inpatient Rehab Discharge Instructions  Shelia Marks Discharge date and time: No discharge date for patient encounter.   Activities/Precautions/ Functional Status: Activity: activity as tolerated Diet: Mechanical soft Wound Care: none needed Functional status:  ___ No restrictions     ___ Walk up steps independently ___ 24/7 supervision/assistance   ___ Walk up steps with assistance ___ Intermittent supervision/assistance  ___ Bathe/dress independently ___ Walk with walker     ___ Bathe/dress with assistance ___ Walk Independently    ___ Shower independently _x STROKE/TIA DISCHARGE INSTRUCTIONS SMOKING Cigarette smoking nearly doubles your risk of having a stroke & is the single most alterable risk factor  If you smoke or have smoked in the last 12 months, you are advised to quit smoking for your health.  Most of the excess cardiovascular risk related to smoking disappears within a year of stopping.  Ask you doctor about anti-smoking medications  [place] Quit Line: 1-800-QUIT NOW  Free Smoking Cessation Classes [phone]  CHOLESTEROL Know your levels; limit fat & cholesterol in your diet  Lipid Panel     Component Value Date/Time   CHOL 149 03/06/2014 0457   TRIG 101 03/06/2014 0457   HDL 46 03/06/2014 0457   CHOLHDL 3.2 03/06/2014 0457   VLDL 20 03/06/2014 0457   LDLCALC 83 03/06/2014 0457      Many patients benefit from treatment even if their cholesterol is at goal.  Goal: Total Cholesterol (CHOL) less than 160  Goal:  Triglycerides (TRIG) less than 150  Goal:  HDL greater than 40  Goal:  LDL (LDLCALC) less than 100   BLOOD PRESSURE American Stroke Association blood pressure target is less that 120/80 mm/Hg  Your discharge blood pressure is:  BP: 128/70 mmHg  Monitor your blood pressure  Limit your salt and alcohol intake  Many individuals will require more than one medication for high blood pressure  DIABETES (A1c is a blood sugar average for last 3 months) Goal  HGBA1c is under 7% (HBGA1c is blood sugar average for last 3 months)  Diabetes: No known diagnosis of diabetes    Lab Results  Component Value Date   HGBA1C 5.1 03/06/2014     Your HGBA1c can be lowered with medications, healthy diet, and exercise.  Check your blood sugar as directed by your physician  Call your physician if you experience unexplained or low blood sugars.  PHYSICAL ACTIVITY/REHABILITATION Goal is 30 minutes at least 4 days per week  Activity: Increase activity slowly, Therapies: Physical Therapy: Home Health Return to work:   Activity decreases your risk of heart attack and stroke and makes your heart stronger.  It helps control your weight and blood pressure; helps you relax and can improve your mood.  Participate in a regular exercise program.  Talk with your doctor about the best form of exercise for you (dancing, walking, swimming, cycling).  DIET/WEIGHT Goal is to maintain a healthy weight  Your discharge diet is: Dysphagia  liquids Your height is:  Height: 6\' 5"  (195.6 cm) Your current weight is: Weight: 77.429 kg (170 lb 11.2 oz) Your Body Mass Index (BMI) is:  BMI (Calculated): 20.3  Following the type of diet specifically designed for you will help prevent another stroke.  Your goal weight range is:    Your goal Body Mass Index (BMI) is 19-24.  Healthy food habits can help reduce 3 risk factors for stroke:  High cholesterol, hypertension, and excess weight.  RESOURCES Stroke/Support Group:  Call [facility]  PROVIDED/REVIEWED AND GIVEN TO PATIENT Stroke warning signs and symptoms How to activate emergency medical system (call 911). Medications prescribed at discharge. Need for follow-up after discharge. Personal risk factors for stroke. Pneumonia vaccine given:  Flu vaccine given:  My questions have been answered, the writing is legible, and I understand these instructions.  I will adhere to these goals & educational materials  that have been provided to me after my discharge from the hospital.    __ Walk with assistance    ___ Shower with assistance ___ No alcohol     ___ Return to work/school ________  Special Instructions:    COMMUNITY REFERRALS UPON DISCHARGE:    Home Health:   PT, OT, [place] ERDEY:[phone] Date of last service:03/19/2014  Medical Equipment/Items [place] RESOURCES FOR PATIENT/FAMILY: Support Groups:[facility] SUPPORT GROUP    My questions have been answered and I understand these instructions. I will adhere to these goals and the provided educational materials after my discharge from the hospital.  Patient/Caregiver Signature _______________________________ Date __________  Clinician Signature _______________________________________ Date __________  Please bring this form and your medication list with you to all your follow-up doctor's appointments.

## 2014-03-18 NOTE — Progress Notes (Signed)
Physical Therapy Session Note  Patient Details  Name: Shelia Marks MRN: [id number] Date of Birth: May 09, 1926  Today's Date: 03/18/2014 Time: 5038-8828 Time Calculation (min): 25 min   Skilled Therapeutic Interventions/Progress Updates:    Session focused on community gait in gift shop (pt declined outdoor ambulation due to cooler weather and rain). Pt supervision with 150' ambulation (forwards/backwards/side stepping) with RW, min cues for locking brakes prior to stand. No overt loss of balance. Pt kept Lt knee brace donned entire session to minimize hyperextension. No overt losses of balance during session. Pt focused on bil UE itching, reported once that she felt her lips were swollen - RN made aware.   Therapy Documentation Precautions:  Precautions Precautions: Fall Precaution Comments: left sided weakness Restrictions Weight Bearing Restrictions: No Pain: Pain Assessment Pain Assessment: No/denies pain Pain Score: 0-No pain  See FIM for current functional status  Therapy/Group: Individual Therapy  Lahoma Rocker 03/18/2014, 12:17 PM

## 2014-03-18 NOTE — Progress Notes (Signed)
Subjective/Complaints:  Both forearms itching Has been using lotion for dry skin Review of Systems - Negative except Left side weak Objective: Vital Signs: Blood pressure 161/93, pulse 74, temperature 98.4 F (36.9 C), temperature source Oral, resp. rate 19, height 6\' 5"  (1.956 m), weight 77.429 kg (170 lb 11.2 oz), SpO2 98.00%. No results found. Results for orders placed during the hospital encounter of 03/08/14 (from the past 72 hour(s))  GLUCOSE, CAPILLARY     Status: Abnormal   Collection Time    03/15/14  9:32 PM      Result Value Ref Range   Glucose-Capillary 134 (*) 70 - 99 mg/dL     HEENT: normal and poor dentition Cardio: RRR and no murmur Resp: CTA B/L and unlabored GI: BS positive and non distended Extremity:  Pulses positive and No Edema Skin:   Intact, no evidence of rash on forearms Neuro: Alert/Oriented, Cranial Nerve II-XII normal, Normal Sensory and Abnormal Motor 5/5 RUE, 4-/5 LUE, 3-/5 B HF, Left Quad, TA/Gastroc, 4-/5 Distal RLE Musc/Skel:  Other absent L patella Gen NAD Neck:  No pain to palp or with ROM  Assessment/Plan: 1. Functional deficits secondary to Right Pontine infarct which require 3+ hours per day of interdisciplinary therapy in a comprehensive inpatient rehab setting. Physiatrist is providing close team supervision and 24 hour management of active medical problems listed below. Physiatrist and rehab team continue to assess barriers to discharge/monitor patient progress toward functional and medical goals.  FIM: FIM - Bathing Bathing Steps Patient Completed: Chest;Right Arm;Left Arm;Abdomen;Left upper leg;Right upper leg;Buttocks;Front perineal area;Left lower leg (including foot);Right lower leg (including foot) Bathing: 5: Set-up assist to: Obtain items  FIM - Upper Body Dressing/Undressing Upper body dressing/undressing steps patient completed: Thread/unthread right sleeve of pullover shirt/dresss;Thread/unthread left sleeve of pullover  shirt/dress;Pull shirt over trunk;Put head through opening of pull over shirt/dress Upper body dressing/undressing: 6: More than reasonable amount of time FIM - Lower Body Dressing/Undressing Lower body dressing/undressing steps patient completed: Thread/unthread right pants leg;Pull pants up/down;Thread/unthread left pants leg;Thread/unthread right underwear leg;Pull underwear up/down;Don/Doff right shoe;Thread/unthread left underwear leg;Don/Doff left shoe Lower body dressing/undressing: 5: Set-up assist to: Don/Doff AFO/prosthesis/orthosis  FIM - Toileting Toileting steps completed by patient: Adjust clothing prior to toileting;Performs perineal hygiene;Adjust clothing after toileting Toileting Assistive Devices: Grab bar or rail for support Toileting: 5: Supervision: Safety issues/verbal cues  FIM - Radio producer Devices: Grab bars;Walker Toilet Transfers: 5-To toilet/BSC: Supervision (verbal cues/safety issues);5-From toilet/BSC: Supervision (verbal cues/safety issues)  FIM - Engineer, site Assistive Devices: Arm rests;Walker;Orthosis (L knee orthosis) Bed/Chair Transfer: 5: Bed > Chair or W/C: Supervision (verbal cues/safety issues);5: Chair or W/C > Bed: Supervision (verbal cues/safety issues)  FIM - Locomotion: Wheelchair Distance: 150 Locomotion: Wheelchair: 0: Activity did not occur FIM - Locomotion: Ambulation Locomotion: Ambulation Assistive Devices: Walker - Rolling;Other (comment) (L knee orthosis) Ambulation/Gait Assistance: 5: Supervision Locomotion: Ambulation: 5: Travels 150 ft or more with supervision/safety issues  Comprehension Comprehension Mode: Auditory Comprehension: 6-Follows complex conversation/direction: With extra time/assistive device  Expression Expression Mode: Verbal Expression: 5-Expresses complex 90% of the time/cues < 10% of the time  Social Interaction Social Interaction: 5-Interacts  appropriately 90% of the time - Needs monitoring or encouragement for participation or interaction.  Problem Solving Problem Solving: 5-Solves basic 90% of the time/requires cueing < 10% of the time  Memory Memory: 6-More than reasonable amt of time  Medical Problem List and Plan:  1. Thrombotic right pontine infarct  2. DVT Prophylaxis/Anticoagulation: Subcutaneous  Lovenox. Monitor platelet counts and any signs of bleeding  3. Pain Management/osteoarthritis: Hydrocodone as needed. Monitor with increased mobility. Neck pain improving 4. Neuropsych: This patient is capable of making decisions on her own behalf.  5. Dysphagia. Dysphagia 3 and thin liquids /followup speech therapy  6. Hypertension. Norvasc 10 mg daily, Tenormin 25 mg daily, lisinopril 5 mg daily. Orthostatic changes from sit to stand documented 3/17 7. Hypothyroidism. Synthroid. Latest TSH level I.686  8. Hyperlipidemia. Lipitor 9.  Itching localized to forearm suspect contact irritation rather than systemic, treat with topical  LOS (Days) 10 A FACE TO FACE EVALUATION WAS PERFORMED  KIRSTEINS,ANDREW E 03/18/2014, 7:44 AM

## 2014-03-18 NOTE — Progress Notes (Signed)
Social Work Patient ID: Shelia Marks, female   DOB: August 02, 1926, 80 y.o.   MRN: [id number] Met with pt and sister, along with her friend late yesterday, family education went well and all feel prepared for discharge on Friday. Pt to first go to her home and then to sister's will share time in between homes.  Pt reports: " It is time to be with someone else, I am old." Will arrange follow up and prepare for discharge tomorrow.

## 2014-03-18 NOTE — Progress Notes (Signed)
Occupational Therapy Session Note  Patient Details  Name: Mirren Gest MRN: [id number] Date of Birth: June 24, 1926  Today's Date: 03/18/2014 Time: 1430-1500 Time Calculation (min): 30 min  Short Term Goals: Week 2:  OT Short Term Goal 1 (Week 2): Focus on LTGs  Skilled Therapeutic Interventions/Progress Updates: ADL-retraining with focus on home safety during performance of light meal prep.   From her room, patient ambulated to kitchen using RW and distant supervision for directions to complete task of making a peanut butter and jelly sandwich.   With instructions and 2 reminders during task patient completed sequence to retrieve and prepare food items from cabinet and freezer, utensil and plate from drawer and cabinet, and operate toaster while maintaining appropriately safe proximity to countertop and RW.  Patient prepared one peanut butter and jelly sandwich without physical assistance required, without evidence of loss of balance or fatigue, and without evidence of impaired safety awareness.   Patient required min supervision due to being unfamiliar with kitchen environment but demonstrated competence with simple meal prep at not less than supervision level.     Therapy Documentation Precautions:  Precautions Precautions: Fall Precaution Comments: left sided weakness Restrictions Weight Bearing Restrictions: No General:   Vital Signs: Therapy Vitals Temp: 97.8 F (36.6 C) Temp src: Oral Pulse Rate: 79 Resp: 18 BP: 128/70 mmHg Patient Position, if appropriate: Sitting Oxygen Therapy SpO2: 98 % O2 Device: None (Room air)  Pain: No pain    See FIM for current functional status  Therapy/Group: Individual Therapy  [place] 03/18/2014, 3:23 PM

## 2014-03-18 NOTE — Discharge Summary (Signed)
[name], [name]           ACCOUNT NO.:  [id number]  MEDICAL RECORD NO.:  [id number]  LOCATION:  4M10C                        FACILITY:  [facility]  PHYSICIAN:  [name], M.D.DATE OF BIRTH:  [birth year]  DATE OF ADMISSION:  03/08/2014 DATE OF DISCHARGE:                              DISCHARGE SUMMARY   DISCHARGE DIAGNOSES: 1. Thrombotic right pontine infarction. 2. Subcutaneous Lovenox for deep vein thrombosis prophylaxis. 3. Pain management. 4. Dysphagia. 5. Hypertension. 6. Hypothyroidism. 7. Hyperlipidemia.  HISTORY OF PRESENT ILLNESS:  This is an 80 year old right-handed female with history of hypertension, who was admitted March 05, 2014, with slurred speech and left-sided weakness.  The patient lives alone, used a cane prior to admission.  MRI of the brain showed a 6 mm acute right pontine infarct.  MRA of the head and neck with no major occlusion or stenosis.  Carotid Dopplers with no ICA stenosis.  Echocardiogram with ejection fraction of 60% and grade 1 diastolic dysfunction.  The patient did not receive t-PA.  Neurology Service was consulted, placed on aspirin for CVA prophylaxis, as well as subcutaneous Lovenox for DVT prophylaxis, tolerating a mechanical soft diet.  Maintained on Rocephin presently for urinalysis study showing 55,000 multiple bacteria. Physical and occupational therapy ongoing.  The patient was admitted for a comprehensive rehab program.  PAST MEDICAL HISTORY:  See discharge diagnoses.  SOCIAL HISTORY:  Lives alone.  FUNCTIONAL HISTORY:  Prior to admission independent with a cane.  FUNCTIONAL STATUS:  Upon admission to rehab services was ambulating 5 feet with decreased gait, velocity, minimal assist for transfers and mobility.  PHYSICAL EXAMINATION:  VITAL SIGNS:  Blood pressure 161/70, pulse 75, temperature 99, respirations 18. GENERAL:  This was an alert female, speech mildly slurred, but fully intelligible.  She followed full  commands.  Oriented x3.  Pupils round and reactive to light. LUNGS:  Clear to auscultation. CARDIAC:  Regular rate and rhythm. ABDOMEN:  Soft, nontender.  Good bowel sounds.  REHABILITATION HOSPITAL COURSE:  The patient was admitted to inpatient rehab services with therapies initiated on a 3-hour daily basis consisting of physical therapy, occupational therapy and rehabilitation nursing.  The following issues were addressed during the patient's rehabilitation stay.  Pertaining to [name]'s thrombotic right pontine infarct remained stable, maintained on aspirin therapy. Subcutaneous Lovenox for DVT prophylaxis.  No bleeding episodes.  Her diet was maintained mechanical soft thin liquids of which she tolerated nicely.  Blood pressures controlled with Norvasc, Tenormin and lisinopril with no orthostatic changes.  She would follow up with her primary MD.  She continued on Synthroid for hypothyroidism with latest TSH level 1.686.  Lipitor for hyperlipidemia.  She had no bowel or bladder disturbances.  The patient received weekly collaborative interdisciplinary team conferences to discuss estimated length of stay, family teaching, and any barriers to discharge.  She was ambulating 150 feet x2 in a controlled environment using a rolling walker and close supervision to minimal assist.  She did have some fatigue and did well with rest breaks.  As for her activities of daily living, she would ambulate to her dresser to receive her items with minimal cuing for positioning of her walker.  Completed bathing at supervision level with  minimal cues for locking her wheelchair brakes prior to standing. Needing some increased time for upper body and lower body dressing. Full family teaching was completed and plan was to be discharged to home.  DISCHARGE MEDICATIONS: 1. Allopurinol 300 mg p.o. daily. 2. Norvasc 10 mg p.o. daily. 3. Aspirin 325 mg p.o. daily. 4. Tenormin 25 mg p.o. daily. 5.  Lipitor 20 mg p.o. daily. 6. Hydrocodone 1 tablet every 6 hours as needed for moderate pain,     dispense of 60 tablets. 7. Synthroid 100 mcg p.o. daily. 8. Lisinopril 2.5 mg p.o. daily.  DIET:  Mechanical soft.  SPECIAL INSTRUCTIONS:  The patient would follow up with Dr. Alysia Penna at the outpatient rehab service office as directed.  Dr. Hollace Kinnier medical management.  Ongoing therapies were dictated as per rehab services.     Lauraine Rinne, P.A.   ______________________________ [name], M.D.    DA/MEDQ  D:  03/18/2014  T:  03/18/2014  Job:  [id number]  cc:   [name], M.D. Hollace Kinnier, DO

## 2014-03-18 NOTE — Progress Notes (Signed)
Physical Therapy Discharge Summary  Patient Details  Name: Shelia Marks MRN: [id number] Date of Birth: 11/22/1926  Today's Date: 03/18/2014 Time: 4010-2725 Time Calculation (min): 55 min  Patient has met 10 of 10 long term goals due to improved activity tolerance, improved balance, improved postural control, increased strength, ability to compensate for deficits, functional use of  left lower extremity and improved attention.  Patient to discharge at an ambulatory level Supervision.   Patient's care partners are independent to provide the necessary physical and cognitive assistance at discharge.  Reasons goals not met: N/A; all goals met.  Recommendation:  Patient will benefit from ongoing skilled PT services in home health setting to continue to advance safe functional mobility, address ongoing impairments in stability/independence with functional mobility and minimize fall risk.  Equipment: rolling walker, L knee orthosis  Reasons for discharge: treatment goals met and discharge from hospital  Patient/family agrees with progress made and goals achieved: Yes  Skilled Therapeutic Interventions/Progress Updates: Pt received seated in bedside chair; agreeable to session. Donned L knee orthosis with min cueing. Performed gait 2x170' in controlled and home environments with rolling walker and supervision; see below for detailed description of gait pattern and cueing required. Negotiation of 12 steps with bilat rails, forward-facing, step-to pattern with supervision. Performed supine<>sit in apartment bed with mod I, increased time. Performed furniture transfer with supervision. W/c mobility x200' in controlled environment with bilat UE's/LE's for initial 50', then with bilat UE's for final 150' with supervision. Toilet transfer with rolling walker, mod I. Pt continent of bladder; RN notified. Therapist departed with pt seated in bedside chair with all needs within reach.  PT  Discharge Precautions/Restrictions Precautions Precautions: Fall Restrictions Weight Bearing Restrictions: No Vital Signs Therapy Vitals Temp: 98.4 F (36.9 C) Temp src: Oral Pulse Rate: 59 Resp: 19 BP: 147/77 mmHg Patient Position, if appropriate: Sitting Oxygen Therapy SpO2: 98 % O2 Device: None (Room air) Pain Pain Assessment Pain Assessment: No/denies pain Pain Score: 0-No pain Vision/Perception  Vision - History Baseline Vision: Wears glasses only for reading Visual History: Cataracts;Corrective eye surgery Patient Visual Report: Blurring of vision;No change from baseline Vision - Assessment Eye Alignment: Within Functional Limits Vision Assessment: Vision tested Perception Perception: Within Functional Limits Praxis Praxis: Intact  Cognition Overall Cognitive Status: Within Functional Limits for tasks assessed Arousal/Alertness: Awake/alert Attention: Sustained Sustained Attention: Appears intact Memory: Impaired Safety/Judgment: Impaired Sensation Sensation Light Touch: Appears Intact Proprioception: Appears Intact Coordination Gross Motor Movements are Fluid and Coordinated: No Fine Motor Movements are Fluid and Coordinated: No Heel Shin Test: Coordination of movement appears equal bilaterally; decreased excursion of movement with LLE secondary to strength limitations Motor  Motor Motor: Within Functional Limits Motor - Discharge Observations: Decreased strength in LLE  Mobility Bed Mobility Bed Mobility: Supine to Sit;Sitting - Scoot to [employer] of Bed;Sit to Supine Supine to Sit: 6: Modified independent (Device/Increase time) Sitting - Scoot to Edge of Bed: 6: Modified independent (Device/Increase time) Sit to Supine: 6: Modified independent (Device/Increase time) Transfers Transfers: Yes Sit to Stand: 5: Supervision Sit to Stand Details: Verbal cues for precautions/safety;Verbal cues for safe use of DME/AE Sit to Stand Details (indicate cue type  and reason): with rolling walker; intermittently requires verbal cueing to remind pt to utilize rolling walker and of safe hand placement Stand to Sit: 5: Supervision Stand to Sit Details (indicate cue type and reason): Verbal cues for precautions/safety;Visual cues for safe use of DME/AE Stand to Sit Details: with rolling walker;  intermittently requires verbal  cueing to remind pt to utilize rolling walker and of safe hand placement Stand Pivot Transfers: 5: Supervision;With armrests Stand Pivot Transfer Details: Verbal cues for safe use of DME/AE;Verbal cues for precautions/safety Stand Pivot Transfer Details (indicate cue type and reason):  intermittently requires verbal cueing to remind pt to utilize rolling walker and of safe hand placement Locomotion  Ambulation Ambulation: Yes Ambulation/Gait Assistance: 5: Supervision Ambulation Distance (Feet): 170 Feet Assistive device: Rolling walker Gait Gait: Yes Gait Pattern: Impaired Gait Pattern: Step-through pattern;Decreased step length - right;Decreased stance time - left;Lateral hip instability;Left genu recurvatum Gait velocity: Without L knee orthosis, pt demonstrates significant L genu recurvatum, varum during gait. While wearing L knee orthosis, pt exhibits no L genu recurvatuma nd significantly less L genu varum with ambulation. High Level Ambulation High Level Ambulation: Side stepping Side Stepping: Using rolling walke requiring supervision, cueing for safety Stairs / Additional Locomotion Stairs: Yes Stairs Assistance: 5: Supervision Stairs Assistance Details: Verbal cues for sequencing Stairs Assistance Details (indicate cue type and reason): Min verbal reminders for sequencing during descent. Stair Management Technique: Two rails;Step to pattern;Forwards Number of Stairs: 12 Ramp: 5: Supervision;Other (comment) (with bilat rails) Architect: Yes Wheelchair Assistance: 5: Supervision Wheelchair  Propulsion: Both upper extremities;Both lower extermities Wheelchair Parts Management: Needs assistance;Supervision/cueing;Other (comment) (Supervision/cueing with brakes; assist with arm/leg rests) Distance: 200  Trunk/Postural Assessment  Cervical Assessment Cervical Assessment: Within Functional Limits Thoracic Assessment Thoracic Assessment: Within Functional Limits Lumbar Assessment Lumbar Assessment: Within Functional Limits Postural Control Postural Control: Within Functional Limits  Balance Balance Balance Assessed: Yes Static Sitting Balance Static Sitting - Balance Support: Feet supported;No upper extremity supported Static Sitting - Level of Assistance: 7: Independent Dynamic Sitting Balance Dynamic Sitting - Balance Support: No upper extremity supported;During functional activity;Feet supported Dynamic Sitting - Level of Assistance: 6: Modified independent (Device/Increase time) Dynamic Sitting - Balance Activities: Lateral lean/weight shifting;Reaching for objects;Reaching across midline Static Standing Balance Static Standing - Balance Support: During functional activity;Bilateral upper extremity supported Static Standing - Level of Assistance: 6: Modified independent (Device/Increase time) Dynamic Standing Balance Dynamic Standing - Balance Support: During functional activity;No upper extremity supported Dynamic Standing - Level of Assistance: 5: Stand by assistance Dynamic Standing - Balance Activities: Forward lean/weight shifting;Reaching for objects;Reaching across midline Extremity Assessment  RUE Assessment RUE Assessment: Within Functional Limits LUE Assessment LUE Assessment: Within Functional Limits RLE Assessment RLE Assessment: Within Functional Limits LLE Strength Left Hip Flexion: 4/5 Left Knee Flexion: 5/5 Left Knee Extension: 4/5 Left Ankle Dorsiflexion: 5/5 Left Ankle Plantar Flexion: 5/5  See FIM for current functional status  [place], Blair  A 03/18/2014, 6:01 PM

## 2014-03-18 NOTE — Progress Notes (Signed)
Occupational Therapy Discharge Summary  Patient Details  Name: Shelia Marks MRN: [id number] Date of Birth: 1926-09-05  Today's Date: 03/18/2014 Time: 8250-0370 and 1330-1400 Time Calculation (min): 45 min and 30 min   Patient has met 11 of 11 long term goals due to improved activity tolerance, improved balance, ability to compensate for deficits, functional use of  LEFT upper and LEFT lower extremity, improved attention, improved awareness and improved coordination.  Patient to discharge at overall supervision-Modified Independent level.  Patient's care partner is independent to provide the necessary physical and cognitive assistance at discharge. Patient's sister will be providing intermittent supervision for patient for self-care tasks. She will also be assisting with home management tasks. Patient's sister has participated in family education where intermittent supervision was emphasized secondary to patient's impaired safety awareness. Patient and her sister report no questions or concerns about discharge at this time.   Reasons goals not met: N/A. All LTGs met.  Recommendation:  Patient will benefit from ongoing skilled OT services in home health setting to continue to advance functional skills in the area of BADLs, safety awareness, strengthening, activity tolerance.  Equipment: No equipment provided  Reasons for discharge: treatment goals met and discharge from hospital  Patient/family agrees with progress made and goals achieved: Yes  Skilled Therapeutic Intervention Session 1: Pt seen for ADL retraining with focus on dynamic standing balance, functional transfers, and safety awareness. Pt received sitting in recliner chair. Retrieved all clothing items and ambulated to w/c with RW with supervision. Completed bathing at sink at Mod I level. Pt required assistance for donning knee brace and able to assist with providing directions of donning to assist with caregiver at home. Pt  utilized shoe horn with no verbal cues to don shoes. Pt requesting to use bathroom and initiated use of RW to ambulate. Completed toilet transfer and toilet task at Mod I level. Pt with decreased safety awareness 2x during session as she attempted to stand before locking w/c brakes and no attempt to fix as she felt chair rolling away during attempted sit>stand. At end of session pt left sitting in w/c with all needs in reach. Pt required frequent rest breaks during therapy session d/t fatigue.   Session 2: Pt seen for 1:1 OT session with focus on functional transfers, safety awareness, and [place] and strengthening. Pt received sitting in recliner chair requesting to go to bathroom. Ambulated with RW to bathroom to complete toilet task at Mod I then completed hand hygiene in standing at sink. Engaged in 9 hole peg test: R:42 sec and L:63 sec. Engaged in coordination and strengthening activity using clothes pins of various resistances. Pt required 1 rest break during task. At end of session pt returned to room and left with all needs in reach. Pt with no questions or concerns about discharge at this time.    OT Discharge Precautions/Restrictions  Precautions Precautions: Fall Restrictions Weight Bearing Restrictions: No General   Vital Signs   Pain Pain Assessment Pain Assessment: No/denies pain Pain Score: 0-No pain ADL   Vision/Perception  Vision - History Baseline Vision: Wears glasses only for reading Visual History: Cataracts;Corrective eye surgery Patient Visual Report: Blurring of vision;No change from baseline Vision - Assessment Eye Alignment: Within Functional Limits Vision Assessment: Vision tested Perception Perception: Within Functional Limits Praxis Praxis: Intact  Cognition Overall Cognitive Status: Within Functional Limits for tasks assessed Arousal/Alertness: Awake/alert Attention: Sustained Sustained Attention: Appears intact Memory: Impaired Safety/Judgment:  Impaired Sensation Sensation Light Touch: Appears Intact Proprioception: Appears Intact  Coordination Gross Motor Movements are Fluid and Coordinated: No Fine Motor Movements are Fluid and Coordinated: No Finger Nose Finger Test: decreased speed and decreased coordination when reaching away from body Heel Shin Test: Coordination of movement appears equal bilaterally; decreased excursion of movement with LLE secondary to strength limitations Motor  Motor Motor: Within Functional Limits Motor - Discharge Observations: Decreased strength in LLE Mobility  Bed Mobility Bed Mobility: Supine to Sit;Sitting - Scoot to [employer] of Bed;Sit to Supine Supine to Sit: 6: Modified independent (Device/Increase time) Sitting - Scoot to Edge of Bed: 6: Modified independent (Device/Increase time) Sit to Supine: 6: Modified independent (Device/Increase time) Transfers Sit to Stand: 5: Supervision Sit to Stand Details: Verbal cues for precautions/safety;Verbal cues for safe use of DME/AE Sit to Stand Details (indicate cue type and reason): with rolling walker; intermittently requires verbal cueing to remind pt to utilize rolling walker and of safe hand placement Stand to Sit: 5: Supervision Stand to Sit Details (indicate cue type and reason): Verbal cues for precautions/safety;Visual cues for safe use of DME/AE Stand to Sit Details: with rolling walker;  intermittently requires verbal cueing to remind pt to utilize rolling walker and of safe hand placement  Trunk/Postural Assessment     Balance   Extremity/Trunk Assessment RUE Assessment RUE Assessment: Within Functional Limits LUE Assessment LUE Assessment: Within Functional Limits  See FIM for current functional status  Duayne Cal 03/18/2014, 12:22 PM

## 2014-03-19 DIAGNOSIS — I10 Essential (primary) hypertension: Secondary | ICD-10-CM

## 2014-03-19 DIAGNOSIS — I69920 Aphasia following unspecified cerebrovascular disease: Secondary | ICD-10-CM

## 2014-03-19 DIAGNOSIS — I69991 Dysphagia following unspecified cerebrovascular disease: Secondary | ICD-10-CM

## 2014-03-19 DIAGNOSIS — I6330 Cerebral infarction due to thrombosis of unspecified cerebral artery: Secondary | ICD-10-CM

## 2014-03-19 MED ORDER — ASPIRIN 325 MG PO TABS: 325.0000 mg | ORAL_TABLET | Freq: Every day | ORAL | Status: DC

## 2014-03-19 MED ORDER — ATORVASTATIN CALCIUM 20 MG PO TABS: 20.0000 mg | ORAL_TABLET | Freq: Every day | ORAL | Status: DC

## 2014-03-19 MED ORDER — ATENOLOL 25 MG PO TABS: 25.0000 mg | ORAL_TABLET | Freq: Every day | ORAL | Status: DC

## 2014-03-19 MED ORDER — HYDROCODONE-ACETAMINOPHEN 5-325 MG PO TABS: 1.0000 | ORAL_TABLET | Freq: Four times a day (QID) | ORAL | Status: DC | PRN

## 2014-03-19 MED ORDER — AMLODIPINE BESYLATE 10 MG PO TABS: 10.0000 mg | ORAL_TABLET | Freq: Every day | ORAL | Status: DC

## 2014-03-19 MED ORDER — ALLOPURINOL 300 MG PO TABS: 300.0000 mg | ORAL_TABLET | Freq: Every day | ORAL | Status: DC

## 2014-03-19 MED ORDER — LEVOTHYROXINE SODIUM 100 MCG PO TABS: 100.0000 ug | ORAL_TABLET | Freq: Every day | ORAL | Status: DC

## 2014-03-19 MED ORDER — LISINOPRIL 2.5 MG PO TABS: 2.5000 mg | ORAL_TABLET | Freq: Every day | ORAL | Status: DC

## 2014-03-19 NOTE — Progress Notes (Signed)
Social Work Discharge Note Discharge Note  The overall goal for the admission was met for:   Discharge location: [place] LEVEL  Length of Stay: Yes-11 DAYS  Discharge activity level: Yes-SUPERVISION LEVEL  Home/community participation: Yes  Services provided included: MD, RD, PT, OT, SLP, RN, CM, TR, Pharmacy and [place]: Medicare and Private Insurance: [place]  Follow-up services arranged: Home Health: [facility]-PT,OT,RN, DME: [facility] and Patient/Family has no preference for HH/DME agencies  Comments (or additional information):FAMILY EDUCATION COMPLETED AND SISTER FEELS COMFORTABLE WITH HER CARE AT HOME  Patient/Family verbalized understanding of follow-up arrangements: Yes  Individual responsible for coordination of the follow-up plan: SELF & [name]-SISTER  Confirmed correct DME delivered: Elease Hashimoto 03/19/2014    Elease Hashimoto

## 2014-03-19 NOTE — Progress Notes (Signed)
Patient and caregiver received discharge from [place], [place] with verbal understanding. Patient discharged to home with belongings.

## 2014-03-19 NOTE — Progress Notes (Signed)
Subjective/Complaints:  No C/os sister coming in for D/C Review of Systems - Negative except Left side weak Objective: Vital Signs: Blood pressure 153/97, pulse 81, temperature 98.5 F (36.9 C), temperature source Oral, resp. rate 19, height 6\' 5"  (1.956 m), weight 77.429 kg (170 lb 11.2 oz), SpO2 99.00%. No results found. No results found for this or any previous visit (from the past 72 hour(s)).   HEENT: normal and poor dentition Cardio: RRR and no murmur Resp: CTA B/L and unlabored GI: BS positive and non distended Extremity:  Pulses positive and No Edema Skin:   Intact, no evidence of rash on forearms Neuro: Alert/Oriented, Cranial Nerve II-XII normal, Normal Sensory and Abnormal Motor 5/5 RUE, 4-/5 LUE, 3-/5 B HF, Left Quad, TA/Gastroc, 4-/5 Distal RLE Musc/Skel:  Other absent L patella Gen NAD Neck:  No pain to palp or with ROM  Assessment/Plan: 1. Functional deficits secondary to Right Pontine infarct  Stable for D/C today F/u PCP in 1-2 weeks F/u PM&R 3 weeks See D/C summary See D/C instructions FIM: FIM - Bathing Bathing Steps Patient Completed: Chest;Right Arm;Left Arm;Abdomen;Left upper leg;Right upper leg;Buttocks;Front perineal area;Left lower leg (including foot);Right lower leg (including foot) Bathing: 6: More than reasonable amount of time  FIM - Upper Body Dressing/Undressing Upper body dressing/undressing steps patient completed: Thread/unthread right sleeve of pullover shirt/dresss;Thread/unthread left sleeve of pullover shirt/dress;Pull shirt over trunk;Put head through opening of pull over shirt/dress Upper body dressing/undressing: 6: More than reasonable amount of time FIM - Lower Body Dressing/Undressing Lower body dressing/undressing steps patient completed: Thread/unthread right pants leg;Pull pants up/down;Thread/unthread left pants leg;Thread/unthread right underwear leg;Pull underwear up/down;Don/Doff right shoe;Thread/unthread left underwear  leg;Don/Doff left shoe Lower body dressing/undressing: 5: Set-up assist to: Don/Doff AFO/prosthesis/orthosis  FIM - Toileting Toileting steps completed by patient: Adjust clothing prior to toileting;Performs perineal hygiene;Adjust clothing after toileting Toileting Assistive Devices: Grab bar or rail for support Toileting: 6: Assistive device: No helper  FIM - Radio producer Devices: Grab bars;Walker Toilet Transfers: 6-More than reasonable amt of time;6-Assistive device: No helper  FIM - Control and instrumentation engineer Devices: Arm rests;Walker;Orthosis (L knee orthosis) Bed/Chair Transfer: 5: Bed > Chair or W/C: Supervision (verbal cues/safety issues);5: Chair or W/C > Bed: Supervision (verbal cues/safety issues);6: Supine > Sit: No assist;6: Sit > Supine: No assist;6: More than reasonable amt of time  FIM - Locomotion: Wheelchair Distance: 200 Locomotion: Wheelchair: 5: Travels 150 ft or more: maneuvers on rugs and over door sills with supervision, cueing or coaxing FIM - Locomotion: Ambulation Locomotion: Ambulation Assistive Devices: Walker - Rolling;Other (comment) (Lt knee orthosis) Ambulation/Gait Assistance: 5: Supervision Locomotion: Ambulation: 5: Travels 150 ft or more with supervision/safety issues  Comprehension Comprehension Mode: Auditory Comprehension: 6-Follows complex conversation/direction: With extra time/assistive device  Expression Expression Mode: Verbal Expression: 6-Expresses complex ideas: With extra time/assistive device  Social Interaction Social Interaction: 6-Interacts appropriately with others with medication or extra time (anti-anxiety, antidepressant).  Problem Solving Problem Solving: 6-Solves complex problems: With extra time  Memory Memory: 6-More than reasonable amt of time  Medical Problem List and Plan:  1. Thrombotic right pontine infarct  2. DVT Prophylaxis/Anticoagulation: Subcutaneous  Lovenox. Monitor platelet counts and any signs of bleeding  3. Pain Management/osteoarthritis: Hydrocodone as needed. Monitor with increased mobility. Neck pain improving 4. Neuropsych: This patient is capable of making decisions on her own behalf.  5. Dysphagia. Dysphagia 3 and thin liquids /followup speech therapy  6. Hypertension. Norvasc 10 mg daily, Tenormin 25 mg daily, lisinopril  5 mg daily. Orthostatic changes from sit to stand documented 3/17, recheck vitals this am pt listed as 6\' 5"  7. Hypothyroidism. Synthroid. Latest TSH level I.686  8. Hyperlipidemia. Lipitor 9.  Itching localized to forearm suspect contact irritation rather than systemic, treat with topical  LOS (Days) 11 A FACE TO FACE EVALUATION WAS PERFORMED  Luken Shadowens E 03/19/2014, 7:27 AM

## 2014-03-20 DIAGNOSIS — M2550 Pain in unspecified joint: Secondary | ICD-10-CM | POA: Diagnosis not present

## 2014-03-20 DIAGNOSIS — I69991 Dysphagia following unspecified cerebrovascular disease: Secondary | ICD-10-CM | POA: Diagnosis not present

## 2014-03-20 DIAGNOSIS — I10 Essential (primary) hypertension: Secondary | ICD-10-CM | POA: Diagnosis not present

## 2014-03-20 DIAGNOSIS — Z602 Problems related to living alone: Secondary | ICD-10-CM | POA: Diagnosis not present

## 2014-03-20 DIAGNOSIS — I69959 Hemiplegia and hemiparesis following unspecified cerebrovascular disease affecting unspecified side: Secondary | ICD-10-CM | POA: Diagnosis not present

## 2014-03-21 DIAGNOSIS — Z602 Problems related to living alone: Secondary | ICD-10-CM | POA: Diagnosis not present

## 2014-03-21 DIAGNOSIS — I69991 Dysphagia following unspecified cerebrovascular disease: Secondary | ICD-10-CM | POA: Diagnosis not present

## 2014-03-21 DIAGNOSIS — I69959 Hemiplegia and hemiparesis following unspecified cerebrovascular disease affecting unspecified side: Secondary | ICD-10-CM | POA: Diagnosis not present

## 2014-03-21 DIAGNOSIS — M2550 Pain in unspecified joint: Secondary | ICD-10-CM | POA: Diagnosis not present

## 2014-03-21 DIAGNOSIS — I10 Essential (primary) hypertension: Secondary | ICD-10-CM | POA: Diagnosis not present

## 2014-03-22 DIAGNOSIS — I10 Essential (primary) hypertension: Secondary | ICD-10-CM | POA: Diagnosis not present

## 2014-03-22 DIAGNOSIS — I69959 Hemiplegia and hemiparesis following unspecified cerebrovascular disease affecting unspecified side: Secondary | ICD-10-CM | POA: Diagnosis not present

## 2014-03-22 DIAGNOSIS — M2550 Pain in unspecified joint: Secondary | ICD-10-CM | POA: Diagnosis not present

## 2014-03-22 DIAGNOSIS — I69991 Dysphagia following unspecified cerebrovascular disease: Secondary | ICD-10-CM | POA: Diagnosis not present

## 2014-03-22 DIAGNOSIS — Z602 Problems related to living alone: Secondary | ICD-10-CM | POA: Diagnosis not present

## 2014-03-23 DIAGNOSIS — M2550 Pain in unspecified joint: Secondary | ICD-10-CM | POA: Diagnosis not present

## 2014-03-23 DIAGNOSIS — I10 Essential (primary) hypertension: Secondary | ICD-10-CM | POA: Diagnosis not present

## 2014-03-23 DIAGNOSIS — I69991 Dysphagia following unspecified cerebrovascular disease: Secondary | ICD-10-CM | POA: Diagnosis not present

## 2014-03-23 DIAGNOSIS — Z602 Problems related to living alone: Secondary | ICD-10-CM | POA: Diagnosis not present

## 2014-03-23 DIAGNOSIS — I69959 Hemiplegia and hemiparesis following unspecified cerebrovascular disease affecting unspecified side: Secondary | ICD-10-CM | POA: Diagnosis not present

## 2014-03-24 ENCOUNTER — Telehealth: Payer: Self-pay

## 2014-03-24 DIAGNOSIS — I69959 Hemiplegia and hemiparesis following unspecified cerebrovascular disease affecting unspecified side: Secondary | ICD-10-CM | POA: Diagnosis not present

## 2014-03-24 DIAGNOSIS — I69991 Dysphagia following unspecified cerebrovascular disease: Secondary | ICD-10-CM | POA: Diagnosis not present

## 2014-03-24 DIAGNOSIS — I10 Essential (primary) hypertension: Secondary | ICD-10-CM | POA: Diagnosis not present

## 2014-03-24 DIAGNOSIS — Z602 Problems related to living alone: Secondary | ICD-10-CM | POA: Diagnosis not present

## 2014-03-24 DIAGNOSIS — M2550 Pain in unspecified joint: Secondary | ICD-10-CM | POA: Diagnosis not present

## 2014-03-24 NOTE — Telephone Encounter (Signed)
Order given to [facility]

## 2014-03-24 NOTE — Telephone Encounter (Signed)
Ok for twice a wk nsg assist

## 2014-03-24 NOTE — Telephone Encounter (Signed)
Patty @ [facility] is requesting a verbal order for a nursing assistant to begin helping patient with bathing 2x a week. Is this okay?

## 2014-03-26 DIAGNOSIS — Z602 Problems related to living alone: Secondary | ICD-10-CM | POA: Diagnosis not present

## 2014-03-26 DIAGNOSIS — I69959 Hemiplegia and hemiparesis following unspecified cerebrovascular disease affecting unspecified side: Secondary | ICD-10-CM | POA: Diagnosis not present

## 2014-03-26 DIAGNOSIS — I69991 Dysphagia following unspecified cerebrovascular disease: Secondary | ICD-10-CM | POA: Diagnosis not present

## 2014-03-26 DIAGNOSIS — M2550 Pain in unspecified joint: Secondary | ICD-10-CM | POA: Diagnosis not present

## 2014-03-26 DIAGNOSIS — I10 Essential (primary) hypertension: Secondary | ICD-10-CM | POA: Diagnosis not present

## 2014-03-29 DIAGNOSIS — Z602 Problems related to living alone: Secondary | ICD-10-CM | POA: Diagnosis not present

## 2014-03-29 DIAGNOSIS — I10 Essential (primary) hypertension: Secondary | ICD-10-CM | POA: Diagnosis not present

## 2014-03-29 DIAGNOSIS — M2550 Pain in unspecified joint: Secondary | ICD-10-CM | POA: Diagnosis not present

## 2014-03-29 DIAGNOSIS — I69959 Hemiplegia and hemiparesis following unspecified cerebrovascular disease affecting unspecified side: Secondary | ICD-10-CM | POA: Diagnosis not present

## 2014-03-29 DIAGNOSIS — I69991 Dysphagia following unspecified cerebrovascular disease: Secondary | ICD-10-CM | POA: Diagnosis not present

## 2014-03-31 DIAGNOSIS — Z602 Problems related to living alone: Secondary | ICD-10-CM | POA: Diagnosis not present

## 2014-03-31 DIAGNOSIS — I69959 Hemiplegia and hemiparesis following unspecified cerebrovascular disease affecting unspecified side: Secondary | ICD-10-CM | POA: Diagnosis not present

## 2014-03-31 DIAGNOSIS — I10 Essential (primary) hypertension: Secondary | ICD-10-CM | POA: Diagnosis not present

## 2014-03-31 DIAGNOSIS — I69991 Dysphagia following unspecified cerebrovascular disease: Secondary | ICD-10-CM | POA: Diagnosis not present

## 2014-03-31 DIAGNOSIS — M2550 Pain in unspecified joint: Secondary | ICD-10-CM | POA: Diagnosis not present

## 2014-04-01 ENCOUNTER — Telehealth: Payer: Self-pay

## 2014-04-01 DIAGNOSIS — I69959 Hemiplegia and hemiparesis following unspecified cerebrovascular disease affecting unspecified side: Secondary | ICD-10-CM | POA: Diagnosis not present

## 2014-04-01 DIAGNOSIS — I10 Essential (primary) hypertension: Secondary | ICD-10-CM | POA: Diagnosis not present

## 2014-04-01 DIAGNOSIS — M2550 Pain in unspecified joint: Secondary | ICD-10-CM | POA: Diagnosis not present

## 2014-04-01 DIAGNOSIS — Z602 Problems related to living alone: Secondary | ICD-10-CM

## 2014-04-01 DIAGNOSIS — I69991 Dysphagia following unspecified cerebrovascular disease: Secondary | ICD-10-CM | POA: Diagnosis not present

## 2014-04-01 NOTE — Telephone Encounter (Signed)
Patient sister/caregiver called to see if it was ok for the patient to have easter dinner at her sisters house instead of her house.  Advised patient sister that would be fine.

## 2014-04-02 DIAGNOSIS — M2550 Pain in unspecified joint: Secondary | ICD-10-CM | POA: Diagnosis not present

## 2014-04-02 DIAGNOSIS — I69991 Dysphagia following unspecified cerebrovascular disease: Secondary | ICD-10-CM | POA: Diagnosis not present

## 2014-04-02 DIAGNOSIS — Z602 Problems related to living alone: Secondary | ICD-10-CM | POA: Diagnosis not present

## 2014-04-02 DIAGNOSIS — I69959 Hemiplegia and hemiparesis following unspecified cerebrovascular disease affecting unspecified side: Secondary | ICD-10-CM | POA: Diagnosis not present

## 2014-04-02 DIAGNOSIS — I10 Essential (primary) hypertension: Secondary | ICD-10-CM | POA: Diagnosis not present

## 2014-04-07 DIAGNOSIS — I10 Essential (primary) hypertension: Secondary | ICD-10-CM | POA: Diagnosis not present

## 2014-04-07 DIAGNOSIS — M2550 Pain in unspecified joint: Secondary | ICD-10-CM | POA: Diagnosis not present

## 2014-04-07 DIAGNOSIS — I69991 Dysphagia following unspecified cerebrovascular disease: Secondary | ICD-10-CM | POA: Diagnosis not present

## 2014-04-07 DIAGNOSIS — Z602 Problems related to living alone: Secondary | ICD-10-CM | POA: Diagnosis not present

## 2014-04-07 DIAGNOSIS — I69959 Hemiplegia and hemiparesis following unspecified cerebrovascular disease affecting unspecified side: Secondary | ICD-10-CM | POA: Diagnosis not present

## 2014-04-09 DIAGNOSIS — I69991 Dysphagia following unspecified cerebrovascular disease: Secondary | ICD-10-CM | POA: Diagnosis not present

## 2014-04-09 DIAGNOSIS — Z602 Problems related to living alone: Secondary | ICD-10-CM | POA: Diagnosis not present

## 2014-04-09 DIAGNOSIS — M2550 Pain in unspecified joint: Secondary | ICD-10-CM | POA: Diagnosis not present

## 2014-04-09 DIAGNOSIS — I10 Essential (primary) hypertension: Secondary | ICD-10-CM | POA: Diagnosis not present

## 2014-04-09 DIAGNOSIS — I69959 Hemiplegia and hemiparesis following unspecified cerebrovascular disease affecting unspecified side: Secondary | ICD-10-CM | POA: Diagnosis not present

## 2014-04-12 ENCOUNTER — Encounter: Payer: Self-pay | Admitting: Internal Medicine

## 2014-04-12 ENCOUNTER — Ambulatory Visit (INDEPENDENT_AMBULATORY_CARE_PROVIDER_SITE_OTHER): Payer: Medicare Other | Admitting: Internal Medicine

## 2014-04-12 VITALS — BP 134/70 | HR 65 | Temp 98.1°F | Wt 168.4 lb

## 2014-04-12 DIAGNOSIS — F0280 Dementia in other diseases classified elsewhere without behavioral disturbance: Secondary | ICD-10-CM

## 2014-04-12 DIAGNOSIS — I10 Essential (primary) hypertension: Secondary | ICD-10-CM | POA: Diagnosis not present

## 2014-04-12 DIAGNOSIS — I69991 Dysphagia following unspecified cerebrovascular disease: Secondary | ICD-10-CM | POA: Diagnosis not present

## 2014-04-12 DIAGNOSIS — E890 Postprocedural hypothyroidism: Secondary | ICD-10-CM | POA: Diagnosis not present

## 2014-04-12 DIAGNOSIS — G309 Alzheimer's disease, unspecified: Secondary | ICD-10-CM

## 2014-04-12 DIAGNOSIS — I69391 Dysphagia following cerebral infarction: Secondary | ICD-10-CM

## 2014-04-12 DIAGNOSIS — K5901 Slow transit constipation: Secondary | ICD-10-CM

## 2014-04-12 DIAGNOSIS — E039 Hypothyroidism, unspecified: Secondary | ICD-10-CM | POA: Diagnosis not present

## 2014-04-12 DIAGNOSIS — I6350 Cerebral infarction due to unspecified occlusion or stenosis of unspecified cerebral artery: Secondary | ICD-10-CM | POA: Diagnosis not present

## 2014-04-12 MED ORDER — ALLOPURINOL 300 MG PO TABS: 300.0000 mg | ORAL_TABLET | Freq: Every day | ORAL | Status: DC

## 2014-04-12 MED ORDER — ATORVASTATIN CALCIUM 20 MG PO TABS: 20.0000 mg | ORAL_TABLET | Freq: Every day | ORAL | Status: DC

## 2014-04-12 MED ORDER — ATENOLOL 25 MG PO TABS: 25.0000 mg | ORAL_TABLET | Freq: Every day | ORAL | Status: DC

## 2014-04-12 MED ORDER — AMLODIPINE BESYLATE 10 MG PO TABS: 10.0000 mg | ORAL_TABLET | Freq: Every day | ORAL | Status: DC

## 2014-04-12 NOTE — Progress Notes (Signed)
Patient ID: Shelia Marks, female   DOB: 08/09/1926, 80 y.o.   MRN: [id number]   Location:  [facility]  Code Status: full code  No Known Allergies  Chief Complaint  Patient presents with  . Medical Managment of Chronic Issues    6 month f/u & hospital f/u form 03/05/14  . other    ? wants to know why the change in cholesterol med's  . Immunizations    declines Tdap & shingles vaccines, ? Pneumo ?    HPI: Patient is a 80 y.o. black female seen in the office today for medical mgt of chronic diseases.  Pt was hospitalized in march for thrombotic right pontine stroke.  She had presented 03/05/14 with slurred speech and left-sided weakness.  MRI of the brain showed a 6 mm acute right pontine infarct. MRA of the head and neck with no major occlusion or stenosis. Carotid Dopplers with no ICA stenosis. Echocardiogram with ejection fraction of 60% and grade 1 diastolic dysfunction. The patient did not receive t-PA.  She had dysphagia and underwent ST and was placed on a mechanical soft diet.  She then had [facility].    Discharge meds: 1. Allopurinol 300 mg p.o. daily.  2. Norvasc 10 mg p.o. daily.  3. Aspirin 325 mg p.o. daily.  4. Tenormin 25 mg p.o. daily.  5. Lipitor 20 mg p.o. daily.  6. Hydrocodone 1 tablet every 6 hours as needed for moderate pain,  dispense of 60 tablets.  7. Synthroid 100 mcg p.o. daily.  8. Lisinopril 2.5 mg p.o. daily.  Is still eating soft foods.  Speech remains mildly dysarthric.  Using walker or cane to get around.  No falls.  No pain.  Feels like her voice is hoarse and it feels different when she swallows.  Is on asa 325mg  now.  Also changed her from zocor to lipitor.  Blood pressure is good today.  Frustrated that she can't walk steadily and do what she wants to do.  Will need to recheck memory next time.  Nothing hurts her.    Gets up frequently at night to urinate.  Hadn't been eating much until late  last week and early this week.  Has been constipated--sometimes more than 3 days between bms.  Takes prune juice warmed and milk sometimes.  Only using one colace pill.  Is going to try 2 colace before adding benefiber or fiber supplement.  Also advised to increase fiber and water intake early in the day.    Review of Systems:  Review of Systems  Constitutional: Negative for malaise/fatigue.  HENT: Negative for hearing loss.   Eyes: Negative for blurred vision.  Respiratory: Negative for shortness of breath.   Cardiovascular: Negative for chest pain and leg swelling.  Genitourinary: Positive for frequency.       3 times  Musculoskeletal: Negative for falls.  Skin: Negative for rash.  Neurological: Positive for sensory change and focal weakness. Negative for dizziness, loss of consciousness and headaches.       Had dizziness morning of stroke;  Left side  Psychiatric/Behavioral: Positive for memory loss. Negative for depression. The patient does not have insomnia.     Past Medical History  Diagnosis Date  . Hypothyroidism   . Cancer   . Hypertension   . Sleep related leg cramps   . Senile osteoporosis   . Malignant neoplasm of thyroid gland   . Nontoxic uninodular goiter   . Dizziness  and giddiness   . Other symptoms involving cardiovascular system   . Alzheimer's disease   . Varicose veins of lower extremities with inflammation   . Disorder of bone and cartilage, unspecified   . Hyperlipidemia LDL goal < 100   . Unspecified disorder of lipoid metabolism   . Unspecified hereditary and idiopathic peripheral neuropathy   . Phlebitis and thrombophlebitis of unspecified site   . Other malaise and fatigue   . Stroke     Past Surgical History  Procedure Laterality Date  . Abdominal hysterectomy      DR HUGES  . Knee surgery      right  . Colon surgery    . Thyroid surgery  July 20, 2011  . Parathyroid adenoma removed  1983  . Catract surgery  2008    Social History:    reports that she has never smoked. She has never used smokeless tobacco. She reports that she does not drink alcohol or use illicit drugs.  Family History  Problem Relation Age of Onset  . Diabetes Brother     Medications: Patient's Medications  New Prescriptions   No medications on file  Previous Medications   ALLOPURINOL (ZYLOPRIM) 300 MG TABLET    Take 1 tablet (300 mg total) by mouth daily.   AMLODIPINE (NORVASC) 10 MG TABLET    Take 1 tablet (10 mg total) by mouth daily.   ASPIRIN 325 MG TABLET    Take 1 tablet (325 mg total) by mouth daily.   ATENOLOL (TENORMIN) 25 MG TABLET    Take 1 tablet (25 mg total) by mouth daily.   ATORVASTATIN (LIPITOR) 20 MG TABLET    Take 1 tablet (20 mg total) by mouth daily at 6 PM.   CALCIUM CARBONATE (TUMS - DOSED IN MG ELEMENTAL CALCIUM) 500 MG CHEWABLE TABLET    Chew 1 tablet by mouth as needed for indigestion or heartburn.   HYDROCODONE-ACETAMINOPHEN (NORCO/VICODIN) 5-325 MG PER TABLET    Take 1 tablet by mouth every 6 (six) hours as needed for moderate pain.   LEVOTHYROXINE (SYNTHROID, LEVOTHROID) 100 MCG TABLET    Take 1 tablet (100 mcg total) by mouth daily. On an empty stomach separate prior to other medications   LISINOPRIL (PRINIVIL,ZESTRIL) 2.5 MG TABLET    Take 1 tablet (2.5 mg total) by mouth daily.  Modified Medications   No medications on file  Discontinued Medications   No medications on file     Physical Exam: Filed Vitals:   04/12/14 1031  BP: 134/70  Pulse: 65  Temp: 98.1 F (36.7 C)  TempSrc: Oral  Weight: 168 lb 6.4 oz (76.386 kg)  SpO2: 99%  Physical Exam  Constitutional: She appears well-developed and well-nourished. No distress.  HENT:  Head: Normocephalic and atraumatic.  Eyes: EOM are normal. Pupils are equal, round, and reactive to light.  Cardiovascular:  irreg irreg  Pulmonary/Chest: Effort normal and breath sounds normal. No respiratory distress.  Abdominal: Soft. Bowel sounds are normal. She exhibits no  distension and no mass. There is no tenderness.  Musculoskeletal: Normal range of motion. She exhibits no edema and no tenderness.  Neurological: She is alert.  Oriented to person and place, but not precise time;  Now walking with walker;  Has 4+/5 strength of LLE, seems remainder are 5/5  Skin: Skin is warm and dry.  Psychiatric: She has a normal mood and affect.    Labs reviewed: Basic Metabolic Panel:  Recent Labs  06/04/13 1001 10/12/13 1116 03/05/14  9381 03/05/14 0843 03/08/14 2005 03/09/14 0530 03/15/14 0553  NA 146* 146* 143 143  --  145  --   K 4.4 4.5 3.6* 3.5*  --  4.2  --   CL 108 106 105 108  --  107  --   CO2 23 25 23 23   --  27  --   GLUCOSE 90 90 98 87  --  82  --   BUN 25 17 24* 20  --  18  --   CREATININE 1.17* 0.92 0.98 0.94 0.98 1.03 0.87  CALCIUM 9.4 10.1 10.1 9.6  --  9.7  --   TSH  --  0.366*  --  1.686  --   --   --    Liver Function Tests:  Recent Labs  03/05/14 0414 03/05/14 0843 03/09/14 0530  AST 19 16 21   ALT 12 9 13   ALKPHOS 80 67 60  BILITOT 0.6 0.6 0.4  PROT 7.8 7.1 6.4  ALBUMIN 4.0 3.3* 2.9*  CBC:  Recent Labs  03/05/14 0414 03/05/14 0843 03/08/14 2005 03/09/14 0530  WBC 6.2 3.6* 5.3 5.5  NEUTROABS 3.3 2.1  --  2.5  HGB 13.0 11.3* 11.4* 11.6*  HCT 39.1 34.9* 34.9* 36.0  MCV 91.1 92.6 90.9 91.1  PLT 184 152 164 153   Lipid Panel:  Recent Labs  03/06/14 0457  CHOL 149  HDL 46  LDLCALC 83  TRIG 101  CHOLHDL 3.2   Lab Results  Component Value Date   HGBA1C 5.1 03/06/2014    Past Procedures during 3/9-3/19/15 hospitalization: MRI of the brain showed a 6 mm acute right pontine infarct.   MRA of the head and neck with no major occlusion or stenosis.   Carotid Dopplers with no ICA stenosis.   Echocardiogram with ejection fraction of 60% and grade 1 diastolic dysfunction.  Assessment/Plan 1. Right pontine stroke -with some residual LLE weakness, dysarthria and dysphagia -cont home health therapy and secondary  prevention of further strokes  2. Hypothyroidism, postsurgical -f/u tsh next time, cont synthroid  3. Alzheimer's disease -likely also has a vascular component -she continues to believe her memory is fine -will recheck next time as it has probably worsened since her stroke  4. Dysphagia as late effect of stroke -cont soft foods and working with home health speech therapy--has made great progress  5. Essential hypertension, benign -bp is at goal with current regimen -goal is <150/90 due to her age and fall risk  6. Constipation, slow transit -pt to continue with prune juice, warm milk, try increasing fiber in diet and use of colace 2 tabs daily  Labs/tests ordered: Orders Placed This Encounter  Procedures  . HM MAMMOGRAPHY    This external order was created through the Results Console.  Marland Kitchen HM DEXA SCAN    This external order was created through the Results Console.  Marland Kitchen HM COLONOSCOPY    This external order was created through the Results Console.    Next appt:  6 weeks

## 2014-04-12 NOTE — Patient Instructions (Signed)
Try 2 colace tablets to help soften your stool Eat more fiber and drink plenty of water early in the day to keep your bowels moving.  You may also use the prune juice and milk if they work for you.

## 2014-04-13 DIAGNOSIS — I69959 Hemiplegia and hemiparesis following unspecified cerebrovascular disease affecting unspecified side: Secondary | ICD-10-CM | POA: Diagnosis not present

## 2014-04-13 DIAGNOSIS — Z602 Problems related to living alone: Secondary | ICD-10-CM | POA: Diagnosis not present

## 2014-04-13 DIAGNOSIS — I10 Essential (primary) hypertension: Secondary | ICD-10-CM | POA: Diagnosis not present

## 2014-04-13 DIAGNOSIS — I69991 Dysphagia following unspecified cerebrovascular disease: Secondary | ICD-10-CM | POA: Diagnosis not present

## 2014-04-13 DIAGNOSIS — M2550 Pain in unspecified joint: Secondary | ICD-10-CM | POA: Diagnosis not present

## 2014-04-16 DIAGNOSIS — Z602 Problems related to living alone: Secondary | ICD-10-CM | POA: Diagnosis not present

## 2014-04-16 DIAGNOSIS — I69991 Dysphagia following unspecified cerebrovascular disease: Secondary | ICD-10-CM | POA: Diagnosis not present

## 2014-04-16 DIAGNOSIS — M2550 Pain in unspecified joint: Secondary | ICD-10-CM | POA: Diagnosis not present

## 2014-04-16 DIAGNOSIS — I10 Essential (primary) hypertension: Secondary | ICD-10-CM | POA: Diagnosis not present

## 2014-04-16 DIAGNOSIS — I69959 Hemiplegia and hemiparesis following unspecified cerebrovascular disease affecting unspecified side: Secondary | ICD-10-CM | POA: Diagnosis not present

## 2014-04-18 DIAGNOSIS — I10 Essential (primary) hypertension: Secondary | ICD-10-CM | POA: Insufficient documentation

## 2014-04-18 DIAGNOSIS — K5901 Slow transit constipation: Secondary | ICD-10-CM | POA: Insufficient documentation

## 2014-04-18 DIAGNOSIS — I6350 Cerebral infarction due to unspecified occlusion or stenosis of unspecified cerebral artery: Secondary | ICD-10-CM | POA: Insufficient documentation

## 2014-04-18 DIAGNOSIS — E890 Postprocedural hypothyroidism: Secondary | ICD-10-CM | POA: Insufficient documentation

## 2014-04-18 DIAGNOSIS — I69391 Dysphagia following cerebral infarction: Secondary | ICD-10-CM | POA: Insufficient documentation

## 2014-04-19 DIAGNOSIS — I10 Essential (primary) hypertension: Secondary | ICD-10-CM | POA: Diagnosis not present

## 2014-04-19 DIAGNOSIS — I69991 Dysphagia following unspecified cerebrovascular disease: Secondary | ICD-10-CM | POA: Diagnosis not present

## 2014-04-19 DIAGNOSIS — Z602 Problems related to living alone: Secondary | ICD-10-CM | POA: Diagnosis not present

## 2014-04-19 DIAGNOSIS — M2550 Pain in unspecified joint: Secondary | ICD-10-CM | POA: Diagnosis not present

## 2014-04-19 DIAGNOSIS — I69959 Hemiplegia and hemiparesis following unspecified cerebrovascular disease affecting unspecified side: Secondary | ICD-10-CM | POA: Diagnosis not present

## 2014-04-21 DIAGNOSIS — Z602 Problems related to living alone: Secondary | ICD-10-CM | POA: Diagnosis not present

## 2014-04-21 DIAGNOSIS — I10 Essential (primary) hypertension: Secondary | ICD-10-CM | POA: Diagnosis not present

## 2014-04-21 DIAGNOSIS — M2550 Pain in unspecified joint: Secondary | ICD-10-CM | POA: Diagnosis not present

## 2014-04-21 DIAGNOSIS — I69959 Hemiplegia and hemiparesis following unspecified cerebrovascular disease affecting unspecified side: Secondary | ICD-10-CM | POA: Diagnosis not present

## 2014-04-21 DIAGNOSIS — I69991 Dysphagia following unspecified cerebrovascular disease: Secondary | ICD-10-CM | POA: Diagnosis not present

## 2014-04-22 DIAGNOSIS — I69991 Dysphagia following unspecified cerebrovascular disease: Secondary | ICD-10-CM | POA: Diagnosis not present

## 2014-04-22 DIAGNOSIS — Z602 Problems related to living alone: Secondary | ICD-10-CM | POA: Diagnosis not present

## 2014-04-22 DIAGNOSIS — I69959 Hemiplegia and hemiparesis following unspecified cerebrovascular disease affecting unspecified side: Secondary | ICD-10-CM | POA: Diagnosis not present

## 2014-04-22 DIAGNOSIS — I10 Essential (primary) hypertension: Secondary | ICD-10-CM | POA: Diagnosis not present

## 2014-04-22 DIAGNOSIS — M2550 Pain in unspecified joint: Secondary | ICD-10-CM | POA: Diagnosis not present

## 2014-04-23 ENCOUNTER — Encounter: Payer: Self-pay | Admitting: Physical Medicine & Rehabilitation

## 2014-04-23 ENCOUNTER — Ambulatory Visit (HOSPITAL_BASED_OUTPATIENT_CLINIC_OR_DEPARTMENT_OTHER): Payer: Medicare Other | Admitting: Physical Medicine & Rehabilitation

## 2014-04-23 ENCOUNTER — Encounter: Payer: Medicare Other | Attending: Physical Medicine & Rehabilitation

## 2014-04-23 VITALS — BP 156/65 | HR 70 | Resp 14 | Ht 63.0 in | Wt 168.8 lb

## 2014-04-23 DIAGNOSIS — E785 Hyperlipidemia, unspecified: Secondary | ICD-10-CM | POA: Diagnosis not present

## 2014-04-23 DIAGNOSIS — E039 Hypothyroidism, unspecified: Secondary | ICD-10-CM | POA: Diagnosis not present

## 2014-04-23 DIAGNOSIS — I6350 Cerebral infarction due to unspecified occlusion or stenosis of unspecified cerebral artery: Secondary | ICD-10-CM

## 2014-04-23 DIAGNOSIS — I69993 Ataxia following unspecified cerebrovascular disease: Secondary | ICD-10-CM | POA: Diagnosis not present

## 2014-04-23 DIAGNOSIS — I10 Essential (primary) hypertension: Secondary | ICD-10-CM | POA: Diagnosis not present

## 2014-04-23 DIAGNOSIS — G309 Alzheimer's disease, unspecified: Secondary | ICD-10-CM | POA: Diagnosis not present

## 2014-04-23 DIAGNOSIS — F0280 Dementia in other diseases classified elsewhere without behavioral disturbance: Secondary | ICD-10-CM | POA: Insufficient documentation

## 2014-04-23 NOTE — Progress Notes (Signed)
Subjective:    Patient ID: Shelia Marks, female    DOB: [birth year], 80 y.o.   MRN: [id number]  HPI This is an 80 year old right-handed female with history of hypertension, who was admitted March 05, 2014, with slurred speech and left-sided weakness.  The patient lives alone, used a cane prior to admission.  MRI of the brain showed a 6 mm acute right pontine infarct.  MRA of the head and neck with no major occlusion or stenosis.  Carotid Dopplers with no ICA stenosis.  Echocardiogram with ejection fraction of 60% and grade 1 diastolic dysfunction.  The patient did not receive t-PA.  Neurology Service was consulted, placed on aspirin for CVA prophylaxis  Currently receiving home health PT and OT 3 times per week. He is at home by herself during the day and often in the evening Independent with dressing and bathing Ambulating with a rolling walker Denies falls at home No longer driving Has followed up with primary care MD on 04/12/2014  Has not tried cooking but is interested in doing so. Has not address this with OT yet Pain Inventory Average Pain 0 Pain Right Now 0 My pain is no pain  In the last 24 hours, has pain interfered with the following? General activity 0 Relation with others 0 Enjoyment of life 0 What TIME of day is your pain at its worst? no pain Sleep (in general) Good  Pain is worse with: no pain Pain improves with: no pain Relief from Meds: no pain  Mobility walk with assistance use a walker  Function retired I need assistance with the following:  shopping  Neuro/Psych bladder control problems trouble walking  Prior Studies Any changes since last visit?  no  Physicians involved in your care Any changes since last visit?  no   Family History  Problem Relation Age of Onset  . Diabetes Brother    History   Social History  . Marital Status: Widowed    Spouse Name: N/A    Number of Children: N/A  . Years of Education: N/A   Social  History Main Topics  . Smoking status: Never Smoker   . Smokeless tobacco: Never Used  . Alcohol Use: No  . Drug Use: No  . Sexual Activity: None   Other Topics Concern  . None   Social History Narrative  . None   Past Surgical History  Procedure Laterality Date  . Abdominal hysterectomy      DR HUGES  . Knee surgery      right  . Colon surgery    . Thyroid surgery  July 20, 2011  . Parathyroid adenoma removed  1983  . Catract surgery  2008   Past Medical History  Diagnosis Date  . Hypothyroidism   . Cancer   . Hypertension   . Sleep related leg cramps   . Senile osteoporosis   . Malignant neoplasm of thyroid gland   . Nontoxic uninodular goiter   . Dizziness and giddiness   . Other symptoms involving cardiovascular system   . Alzheimer's disease   . Varicose veins of lower extremities with inflammation   . Disorder of bone and cartilage, unspecified   . Hyperlipidemia LDL goal < 100   . Unspecified disorder of lipoid metabolism   . Unspecified hereditary and idiopathic peripheral neuropathy   . Phlebitis and thrombophlebitis of unspecified site   . Other malaise and fatigue   . Stroke    BP 156/65  Pulse 70  Resp  14  Ht 5\' 3"  (1.6 m)  Wt 168 lb 12.8 oz (76.567 kg)  BMI 29.91 kg/m2  SpO2 98%  Opioid Risk Score:   Fall Risk Score: Moderate Fall Risk (6-13 points) (educated and handout given on fall prevention in the home) Review of Systems  Genitourinary:       Bladder control problems  Musculoskeletal: Positive for gait problem.  All other systems reviewed and are negative.      Objective:   Physical Exam  Motor strength is 5/5 bilateral biceps triceps and grip 3 minus right deltoid secondary to pain and limited range of motion to 5/5 left deltoid 5/5 bilateral hip flexor knee extensor, ankle dorsiflexor and plantar flexor Sensation intact to pinprick bilateral upper extremities with exception of the ulnar distribution left hand Sit with a rolling  walker no evidence of toe drag her knee instability Mild fine motor deficits with left finger to thumb opposition  Mild dysdiadochokinesis in the left upper Cerebellar testing intact in the uppers Heel to situation testing is normal on the right but impaired to the left side      Assessment & Plan:  1. Right pontine infarct with residual balance disorder as well as fine motor deficits and mild ataxia. Making progress with therapy. Patient does not have transportation to outpatient therapy. She will be finishing up with home health therapy and about 2 weeks. Patient is to call if she still using a walker because she has goals of getting back to a cane with additional therapy She will also need to find transportation Follow up with primary care physician  Advise no driving Advise trial of simple meal preparation with occupational therapy in the home environment Physical medicine rehabilitation followup on an as-needed basis

## 2014-04-23 NOTE — Patient Instructions (Signed)
If you have transportation to get to outpatient therapy please call me to order outpatient OT and PT

## 2014-04-28 ENCOUNTER — Encounter: Payer: Self-pay | Admitting: Internal Medicine

## 2014-04-28 DIAGNOSIS — I10 Essential (primary) hypertension: Secondary | ICD-10-CM | POA: Diagnosis not present

## 2014-04-28 DIAGNOSIS — M2550 Pain in unspecified joint: Secondary | ICD-10-CM | POA: Diagnosis not present

## 2014-04-28 DIAGNOSIS — I69959 Hemiplegia and hemiparesis following unspecified cerebrovascular disease affecting unspecified side: Secondary | ICD-10-CM | POA: Diagnosis not present

## 2014-04-28 DIAGNOSIS — I69991 Dysphagia following unspecified cerebrovascular disease: Secondary | ICD-10-CM | POA: Diagnosis not present

## 2014-04-28 DIAGNOSIS — Z602 Problems related to living alone: Secondary | ICD-10-CM | POA: Diagnosis not present

## 2014-04-29 ENCOUNTER — Telehealth: Payer: Self-pay

## 2014-04-29 NOTE — Telephone Encounter (Signed)
Patient's dentist office called indicating they sent over a medical clearance form for completion and has not heard anything back yet. Patient with pending surgery date for 05/04/14  I called Dr.Reed to see if she was familiar with receiving medical clearance on patient. Per Dr.Reed no paperwork was received.  I contacted patient to clarify what type of surgery she had pending for 05/04/14- tooth extraction    I tried several times to call [place] back to have paperwork re-faxed, line was continually busy. I will try again later

## 2014-05-03 ENCOUNTER — Other Ambulatory Visit: Payer: Self-pay | Admitting: Internal Medicine

## 2014-05-03 NOTE — Progress Notes (Signed)
Medical consultation form received and completed today from [place] and [place], [place], [place].  She is to have multiple dental extractions.  Pt had a recent pontine stroke and is now on aspirin 325mg  daily.  She takes no other anticoagulation.  Her surgery is 05/04/14.  Continue aspirin.

## 2014-05-03 NOTE — Telephone Encounter (Signed)
Per Dr.Reed ok to proceed with surgery (tooth extraction), fax was sent to Dr.Brown's office

## 2014-05-19 ENCOUNTER — Other Ambulatory Visit: Payer: Self-pay | Admitting: Internal Medicine

## 2014-05-27 ENCOUNTER — Ambulatory Visit (INDEPENDENT_AMBULATORY_CARE_PROVIDER_SITE_OTHER): Payer: Medicare Other | Admitting: Internal Medicine

## 2014-05-27 ENCOUNTER — Encounter: Payer: Self-pay | Admitting: Internal Medicine

## 2014-05-27 VITALS — BP 138/70 | HR 72 | Temp 97.8°F | Resp 10 | Wt 170.0 lb

## 2014-05-27 DIAGNOSIS — I6350 Cerebral infarction due to unspecified occlusion or stenosis of unspecified cerebral artery: Secondary | ICD-10-CM | POA: Diagnosis not present

## 2014-05-27 DIAGNOSIS — G309 Alzheimer's disease, unspecified: Secondary | ICD-10-CM | POA: Diagnosis not present

## 2014-05-27 DIAGNOSIS — E890 Postprocedural hypothyroidism: Secondary | ICD-10-CM | POA: Diagnosis not present

## 2014-05-27 DIAGNOSIS — F0280 Dementia in other diseases classified elsewhere without behavioral disturbance: Secondary | ICD-10-CM

## 2014-05-27 DIAGNOSIS — I69991 Dysphagia following unspecified cerebrovascular disease: Secondary | ICD-10-CM | POA: Diagnosis not present

## 2014-05-27 DIAGNOSIS — K5901 Slow transit constipation: Secondary | ICD-10-CM

## 2014-05-27 DIAGNOSIS — I10 Essential (primary) hypertension: Secondary | ICD-10-CM

## 2014-05-27 DIAGNOSIS — I69391 Dysphagia following cerebral infarction: Secondary | ICD-10-CM

## 2014-05-27 NOTE — Patient Instructions (Signed)
Please buy a weekly pillbox with morning and evening at the pharmacy and use this for your pills.

## 2014-05-27 NOTE — Progress Notes (Signed)
Patient ID: Shelia Marks, female   DOB: [birth year], 80 y.o.   MRN: [id number]   Location:  [facility]  Code Status: full code  No Known Allergies  Chief Complaint  Patient presents with  . Follow-up    6 week follow-up, MMSE completed (22/30)   . Results    Discuss Genetic Testing     HPI: Patient is a 80 y.o. black female seen in the office today for medical mgt of chronic diseases.  Her sister has come here with her and requests I call her tomorrow b/c of her diagnosis of Alzheimer's on her chart.  We have discussed her memory loss for the past two years, but [name] seems surprised that Malicia has this.    Scored 22/30.    Still doing exercising at home as taught by therapist after stroke.    Says she gets real tired when doing something.  Has been drinking boost.  Appetite is good.  Eating full meals.  Mood is fine.  Her sister makes a face about this.  Always says she is so used to doing for herself.  Taking all of her medicines, she says.  Does not use a pillbox.  Takes them out of the bottles.  Sometimes does forget cholesterol pill at 6pm, but sees it later and takes it  Swallowing: still getting strangled at times.  Only bothers her when drinking.  Solids do ok.  Cannot lift her pots and pans like she used to so isn't cooking now.  Mostly microwaving and family is bringing things.  Always has food from her sister, [name] and [name]'s daughter.    Review of Systems:  Review of Systems  Constitutional: Negative for fever.  HENT:       Had several teeth extracted  Eyes: Negative for blurred vision.  Respiratory: Negative for shortness of breath.   Cardiovascular: Negative for chest pain and leg swelling.  Gastrointestinal: Negative for constipation.  Genitourinary: Negative for dysuria.  Musculoskeletal: Negative for falls and myalgias.  Neurological: Positive for sensory change, focal weakness and weakness. Negative for  dizziness, loss of consciousness and headaches.       Left side weaker than right  Psychiatric/Behavioral: Positive for depression and memory loss.       Sister notes irritability, but pt denies then complains about everything she has lost recently with her stroke, her teeth, etc.    Past Medical History  Diagnosis Date  . Hypothyroidism   . Cancer   . Hypertension   . Sleep related leg cramps   . Senile osteoporosis   . Malignant neoplasm of thyroid gland   . Nontoxic uninodular goiter   . Dizziness and giddiness   . Other symptoms involving cardiovascular system   . Alzheimer's disease   . Varicose veins of lower extremities with inflammation   . Disorder of bone and cartilage, unspecified   . Hyperlipidemia LDL goal < 100   . Unspecified disorder of lipoid metabolism   . Unspecified hereditary and idiopathic peripheral neuropathy   . Phlebitis and thrombophlebitis of unspecified site   . Other malaise and fatigue   . Stroke     Past Surgical History  Procedure Laterality Date  . Abdominal hysterectomy      DR HUGES  . Knee surgery      right  . Colon surgery    . Thyroid surgery  July 20, 2011  . Parathyroid adenoma removed  1983  . Catract  surgery  2008    Social History:   reports that she has never smoked. She has never used smokeless tobacco. She reports that she does not drink alcohol or use illicit drugs.  Family History  Problem Relation Age of Onset  . Diabetes Brother     Medications: Patient's Medications  New Prescriptions   No medications on file  Previous Medications   ALLOPURINOL (ZYLOPRIM) 300 MG TABLET    Take 1 tablet (300 mg total) by mouth daily.   AMLODIPINE (NORVASC) 10 MG TABLET    Take 1 tablet (10 mg total) by mouth daily.   ASPIRIN 325 MG TABLET    Take 1 tablet (325 mg total) by mouth daily.   ATENOLOL (TENORMIN) 25 MG TABLET    Take 1 tablet (25 mg total) by mouth daily.   ATORVASTATIN (LIPITOR) 20 MG TABLET    Take 1 tablet (20  mg total) by mouth daily at 6 PM. Take 1 tablet by mouth daily at 6 pm for cholesterol   CALCIUM CARBONATE (TUMS - DOSED IN MG ELEMENTAL CALCIUM) 500 MG CHEWABLE TABLET    Chew 1 tablet by mouth as needed for indigestion or heartburn.   LEVOTHYROXINE (SYNTHROID, LEVOTHROID) 100 MCG TABLET    Take 1 tablet (100 mcg total) by mouth daily. On an empty stomach separate prior to other medications   LISINOPRIL (PRINIVIL,ZESTRIL) 2.5 MG TABLET    Take 1 tablet (2.5 mg total) by mouth daily.  Modified Medications   No medications on file  Discontinued Medications   LISINOPRIL (PRINIVIL,ZESTRIL) 5 MG TABLET    TAKE 1 TABLET (5 MG TOTAL) BY MOUTH DAILY.     Physical Exam: Filed Vitals:   05/27/14 1041  BP: 138/70  Pulse: 72  Temp: 97.8 F (36.6 C)  TempSrc: Oral  Resp: 10  Weight: 170 lb (77.111 kg)  Physical Exam  Constitutional: She appears well-developed and well-nourished. No distress.  Cardiovascular: Normal rate, regular rhythm, normal heart sounds and intact distal pulses.   Pulmonary/Chest: Effort normal and breath sounds normal. No respiratory distress.  Abdominal: Soft. Bowel sounds are normal. She exhibits no distension and no mass. There is no tenderness.  Musculoskeletal: She exhibits no edema and no tenderness.  4+/5 strength LLE, remainder extremities 5/5  Neurological: She is alert.  Oriented to person and place, not time--see MMSE 22/30 today  Skin: Skin is warm and dry.  Psychiatric: She has a normal mood and affect.    Labs reviewed: Basic Metabolic Panel:  Recent Labs  06/04/13 1001 10/12/13 1116 03/05/14 0414 03/05/14 0843 03/08/14 2005 03/09/14 0530 03/15/14 0553  NA 146* 146* 143 143  --  145  --   K 4.4 4.5 3.6* 3.5*  --  4.2  --   CL 108 106 105 108  --  107  --   CO2 23 25 23 23   --  27  --   GLUCOSE 90 90 98 87  --  82  --   BUN 25 17 24* 20  --  18  --   CREATININE 1.17* 0.92 0.98 0.94 0.98 1.03 0.87  CALCIUM 9.4 10.1 10.1 9.6  --  9.7  --   TSH   --  0.366*  --  1.686  --   --   --    Liver Function Tests:  Recent Labs  03/05/14 0414 03/05/14 0843 03/09/14 0530  AST 19 16 21   ALT 12 9 13   ALKPHOS 80 67 60  BILITOT  0.6 0.6 0.4  PROT 7.8 7.1 6.4  ALBUMIN 4.0 3.3* 2.9*   No results found for this basename: LIPASE, AMYLASE,  in the last 8760 hours No results found for this basename: AMMONIA,  in the last 8760 hours CBC:  Recent Labs  03/05/14 0414 03/05/14 0843 03/08/14 2005 03/09/14 0530  WBC 6.2 3.6* 5.3 5.5  NEUTROABS 3.3 2.1  --  2.5  HGB 13.0 11.3* 11.4* 11.6*  HCT 39.1 34.9* 34.9* 36.0  MCV 91.1 92.6 90.9 91.1  PLT 184 152 164 153   Lipid Panel:  Recent Labs  03/06/14 0457  CHOL 149  HDL 46  LDLCALC 83  TRIG 101  CHOLHDL 3.2   Lab Results  Component Value Date   HGBA1C 5.1 03/06/2014    Assessment/Plan 1. Right pontine stroke -still recovering--doing therapy exercises at home -continues on secondary preventive meds--reviewed crucial nature of each one today (she was trying to get me to stop some) -seems this has affected her memory more, plus has left sided weakness, decreased coordination and some dysphagia to liquids  2. Alzheimer's disease -progressing slowly, plus has some vascular component as well -her sister [name] requests that I call her tomorrow to discuss this -pt has refused to take meds for this when previously prescribed -emphasized importance of using pillbox and for [name] to check on whether she is taking them  3. Hypothyroidism, postsurgical -cont current synthroid  4. Dysphagia as late effect of stroke -gave some suggestions for swallowing liquids to help prevent aspiration--if persists or worsens, may need repeat swallow study  5. Essential hypertension, benign -bp at goal with current meds--would not change regimen  6. Constipation, slow transit -cont current regimen and high fiber diet  Labs/tests ordered:  None today, stable Next appt:  3 mos

## 2014-05-27 NOTE — Progress Notes (Signed)
Failed clock drawing  

## 2014-06-08 ENCOUNTER — Encounter: Payer: Self-pay | Admitting: Internal Medicine

## 2014-08-11 ENCOUNTER — Other Ambulatory Visit: Payer: Self-pay | Admitting: Internal Medicine

## 2014-08-27 ENCOUNTER — Encounter: Payer: Self-pay | Admitting: Internal Medicine

## 2014-08-27 ENCOUNTER — Ambulatory Visit (INDEPENDENT_AMBULATORY_CARE_PROVIDER_SITE_OTHER): Payer: Medicare Other | Admitting: Internal Medicine

## 2014-08-27 VITALS — BP 130/76 | HR 62 | Temp 98.0°F | Ht 63.0 in | Wt 168.6 lb

## 2014-08-27 DIAGNOSIS — Z23 Encounter for immunization: Secondary | ICD-10-CM

## 2014-08-27 DIAGNOSIS — K5901 Slow transit constipation: Secondary | ICD-10-CM

## 2014-08-27 DIAGNOSIS — F3289 Other specified depressive episodes: Secondary | ICD-10-CM

## 2014-08-27 DIAGNOSIS — I10 Essential (primary) hypertension: Secondary | ICD-10-CM

## 2014-08-27 DIAGNOSIS — IMO0002 Reserved for concepts with insufficient information to code with codable children: Secondary | ICD-10-CM

## 2014-08-27 DIAGNOSIS — I6350 Cerebral infarction due to unspecified occlusion or stenosis of unspecified cerebral artery: Secondary | ICD-10-CM | POA: Diagnosis not present

## 2014-08-27 DIAGNOSIS — F329 Major depressive disorder, single episode, unspecified: Secondary | ICD-10-CM

## 2014-08-27 DIAGNOSIS — E890 Postprocedural hypothyroidism: Secondary | ICD-10-CM

## 2014-08-27 DIAGNOSIS — I69991 Dysphagia following unspecified cerebrovascular disease: Secondary | ICD-10-CM

## 2014-08-27 DIAGNOSIS — I69391 Dysphagia following cerebral infarction: Secondary | ICD-10-CM

## 2014-08-27 MED ORDER — LISINOPRIL 2.5 MG PO TABS: 2.5000 mg | ORAL_TABLET | Freq: Every day | ORAL | Status: DC

## 2014-08-27 NOTE — Patient Instructions (Signed)
Let me know if you want to take an antidepressant.  I recommend you do.

## 2014-08-27 NOTE — Progress Notes (Signed)
Patient ID: Shelia Marks, female   DOB: 1926-04-15, 80 y.o.   MRN: [id number]   Location:  [facility] [place] [facility]  Code Status: full code  No Known Allergies  Chief Complaint  Patient presents with  . Medical Management of Chronic Issues    3 month f/u (no labs)  . other    neg fall screening, positive depression screening, weakness in the LT leg since getting out of hospital from having a stroke.  . Immunizations    declines Tdap  & shingles    HPI: Patient is a 80 y.o. black female seen in the office today for medical mgt of chronic diseases.  Her depression scale was positive today.  Is disappointed she has not recovered her strength since her stroke.  Left leg still very weak and she can't walk well.  She is tearful telling me about how she is tired and just can't get up and go.  Says she is used to doing something.  Still lives alone.    Agrees to prevnar vaccine.  Review of Systems:  Review of Systems  Constitutional: Positive for malaise/fatigue. Negative for fever.  HENT: Negative for congestion.   Eyes: Negative for blurred vision.  Respiratory: Negative for shortness of breath.   Cardiovascular: Negative for chest pain.  Gastrointestinal: Negative for abdominal pain, constipation, blood in stool and melena.  Genitourinary: Negative for dysuria.  Musculoskeletal: Negative for falls and myalgias.       Left leg weakness  Skin: Negative for rash.  Neurological: Negative for dizziness, loss of consciousness, weakness and headaches.  Endo/Heme/Allergies: Does not bruise/bleed easily.  Psychiatric/Behavioral: Positive for depression. Negative for memory loss.     Past Medical History  Diagnosis Date  . Hypothyroidism   . Cancer   . Hypertension   . Sleep related leg cramps   . Senile osteoporosis   . Malignant neoplasm of thyroid gland   . Nontoxic uninodular goiter   . Dizziness and giddiness   . Other symptoms involving  cardiovascular system   . Alzheimer's disease   . Varicose veins of lower extremities with inflammation   . Disorder of bone and cartilage, unspecified   . Hyperlipidemia LDL goal < 100   . Unspecified disorder of lipoid metabolism   . Unspecified hereditary and idiopathic peripheral neuropathy   . Phlebitis and thrombophlebitis of unspecified site   . Other malaise and fatigue   . Stroke     Past Surgical History  Procedure Laterality Date  . Abdominal hysterectomy      DR HUGES  . Knee surgery      right  . Colon surgery    . Thyroid surgery  July 20, 2011  . Parathyroid adenoma removed  1983  . Catract surgery  2008    Social History:   reports that she has never smoked. She has never used smokeless tobacco. She reports that she does not drink alcohol or use illicit drugs.  Family History  Problem Relation Age of Onset  . Diabetes Brother     Medications: Patient's Medications  New Prescriptions   No medications on file  Previous Medications   ALLOPURINOL (ZYLOPRIM) 300 MG TABLET    Take 1 tablet (300 mg total) by mouth daily.   AMLODIPINE (NORVASC) 10 MG TABLET    Take 1 tablet (10 mg total) by mouth daily.   ASPIRIN 325 MG TABLET    Take 1 tablet (325 mg total) by mouth daily.  ATENOLOL (TENORMIN) 25 MG TABLET    Take 1 tablet (25 mg total) by mouth daily.   ATORVASTATIN (LIPITOR) 20 MG TABLET    TAKE 1 TABLET EVERY DAY  AT  6  PM FOR CHOLESTEROL   CALCIUM CARBONATE (TUMS - DOSED IN MG ELEMENTAL CALCIUM) 500 MG CHEWABLE TABLET    Chew 1 tablet by mouth as needed for indigestion or heartburn.   LEVOTHYROXINE (SYNTHROID, LEVOTHROID) 100 MCG TABLET    Take 1 tablet (100 mcg total) by mouth daily. On an empty stomach separate prior to other medications   LISINOPRIL (PRINIVIL,ZESTRIL) 2.5 MG TABLET    Take 2.5 mg by mouth daily.  Modified Medications   No medications on file  Discontinued Medications   LISINOPRIL (PRINIVIL,ZESTRIL) 2.5 MG TABLET    Take 1 tablet (2.5  mg total) by mouth daily.   LISINOPRIL (PRINIVIL,ZESTRIL) 5 MG TABLET    TAKE 1 TABLET (5 MG TOTAL) BY MOUTH DAILY.     Physical Exam: Filed Vitals:   08/27/14 1052  BP: 130/76  Pulse: 62  Temp: 98 F (36.7 C)  TempSrc: Oral  Height: 5\' 3"  (1.6 m)  Weight: 168 lb 9.6 oz (76.476 kg)  SpO2: 98%  Physical Exam  Constitutional: She appears well-developed and well-nourished. No distress.  Cardiovascular: Normal rate, regular rhythm, normal heart sounds and intact distal pulses.   Pulmonary/Chest: Effort normal and breath sounds normal. No respiratory distress.  Abdominal: Soft. Bowel sounds are normal.  Musculoskeletal:  4+/5 LLE, 5/5 other extremities, walking with cane slowly  Neurological: She is alert.  Psychiatric:  Tearful during visit, flat affect     Labs reviewed: Basic Metabolic Panel:  Recent Labs  10/12/13 1116 03/05/14 0414 03/05/14 0843 03/08/14 2005 03/09/14 0530 03/15/14 0553  NA 146* 143 143  --  145  --   K 4.5 3.6* 3.5*  --  4.2  --   CL 106 105 108  --  107  --   CO2 25 23 23   --  27  --   GLUCOSE 90 98 87  --  82  --   BUN 17 24* 20  --  18  --   CREATININE 0.92 0.98 0.94 0.98 1.03 0.87  CALCIUM 10.1 10.1 9.6  --  9.7  --   TSH 0.366*  --  1.686  --   --   --    Liver Function Tests:  Recent Labs  03/05/14 0414 03/05/14 0843 03/09/14 0530  AST 19 16 21   ALT 12 9 13   ALKPHOS 80 67 60  BILITOT 0.6 0.6 0.4  PROT 7.8 7.1 6.4  ALBUMIN 4.0 3.3* 2.9*   No results found for this basename: LIPASE, AMYLASE,  in the last 8760 hours No results found for this basename: AMMONIA,  in the last 8760 hours CBC:  Recent Labs  03/05/14 0414 03/05/14 0843 03/08/14 2005 03/09/14 0530  WBC 6.2 3.6* 5.3 5.5  NEUTROABS 3.3 2.1  --  2.5  HGB 13.0 11.3* 11.4* 11.6*  HCT 39.1 34.9* 34.9* 36.0  MCV 91.1 92.6 90.9 91.1  PLT 184 152 164 153   Lipid Panel:  Recent Labs  03/06/14 0457  CHOL 149  HDL 46  LDLCALC 83  TRIG 101  CHOLHDL 3.2   Lab  Results  Component Value Date   HGBA1C 5.1 03/06/2014   Assessment/Plan 1. Depression due to stroke -refusing medication despite my recommendation -advised she needs to go out and socialize with her friends like  she used to -advised I will complete a [employer] bus application portion for her and she has looked into meals on wheels but there was a one year waiting list  2. Right pontine stroke - still has some residual left leg weakness, is ambulating with cane - CBC With differential/Platelet  3. Dysphagia as late effect of stroke -has osteophyte in her neck that sometimes interferes with swallowing if she turns her head left--less bothersome than right after stroke  4. Essential hypertension, benign -at goal - lisinopril (PRINIVIL,ZESTRIL) 2.5 MG tablet; Take 1 tablet (2.5 mg total) by mouth daily.  Dispense: 90 tablet; Refill: 1 - CBC With differential/Platelet - Basic metabolic panel  5. Hypothyroidism, postsurgical -will f/u lab due to worsened depression and to reassess if dose is best - TSH  6. Constipation, slow transit -bowels move, but needs to take MOM sometimes  7. Need for vaccination with 13-polyvalent pneumococcal conjugate vaccine -prevnar given today  Labs/tests ordered:   Orders Placed This Encounter  Procedures  . CBC With differential/Platelet  . Basic metabolic panel  . TSH    Next appt:  3 mos

## 2014-08-28 LAB — CBC WITH DIFFERENTIAL
Basophils Absolute: 0 10*3/uL (ref 0.0–0.2)
Basos: 1 %
Eos: 3 %
Eosinophils Absolute: 0.1 10*3/uL (ref 0.0–0.4)
HCT: 37.6 % (ref 34.0–46.6)
Hemoglobin: 12.3 g/dL (ref 11.1–15.9)
Immature Grans (Abs): 0 10*3/uL (ref 0.0–0.1)
Immature Granulocytes: 0 %
Lymphocytes Absolute: 1.6 10*3/uL (ref 0.7–3.1)
Lymphs: 36 %
MCH: 30 pg (ref 26.6–33.0)
MCHC: 32.7 g/dL (ref 31.5–35.7)
MCV: 92 fL (ref 79–97)
Monocytes Absolute: 0.4 10*3/uL (ref 0.1–0.9)
Monocytes: 9 %
Neutrophils Absolute: 2.3 10*3/uL (ref 1.4–7.0)
Neutrophils Relative %: 51 %
Platelets: 210 10*3/uL (ref 150–379)
RBC: 4.1 x10E6/uL (ref 3.77–5.28)
RDW: 15.9 % — ABNORMAL HIGH (ref 12.3–15.4)
WBC: 4.4 10*3/uL (ref 3.4–10.8)

## 2014-08-28 LAB — BASIC METABOLIC PANEL
BUN/Creatinine Ratio: 13 (ref 11–26)
BUN: 13 mg/dL (ref 8–27)
CO2: 26 mmol/L (ref 18–29)
Calcium: 10 mg/dL (ref 8.7–10.3)
Chloride: 104 mmol/L (ref 97–108)
Creatinine, Ser: 0.99 mg/dL (ref 0.57–1.00)
GFR calc Af Amer: 60 mL/min/{1.73_m2} (ref >59)
GFR calc non Af Amer: 52 mL/min/{1.73_m2} — ABNORMAL LOW (ref >59)
Glucose: 87 mg/dL (ref 65–99)
Potassium: 4.1 mmol/L (ref 3.5–5.2)
Sodium: 144 mmol/L (ref 134–144)

## 2014-08-28 LAB — TSH: TSH: 1.06 u[IU]/mL (ref 0.450–4.500)

## 2014-08-30 ENCOUNTER — Encounter: Payer: Self-pay | Admitting: *Deleted

## 2014-10-20 DIAGNOSIS — M1712 Unilateral primary osteoarthritis, left knee: Secondary | ICD-10-CM | POA: Diagnosis not present

## 2014-10-27 ENCOUNTER — Other Ambulatory Visit: Payer: Self-pay | Admitting: Internal Medicine

## 2014-10-28 ENCOUNTER — Ambulatory Visit: Payer: Self-pay | Admitting: Nurse Practitioner

## 2014-11-09 DIAGNOSIS — Z23 Encounter for immunization: Secondary | ICD-10-CM | POA: Diagnosis not present

## 2014-12-02 ENCOUNTER — Encounter: Payer: Self-pay | Admitting: Internal Medicine

## 2014-12-02 ENCOUNTER — Ambulatory Visit (INDEPENDENT_AMBULATORY_CARE_PROVIDER_SITE_OTHER): Payer: Medicare Other | Admitting: Internal Medicine

## 2014-12-02 VITALS — BP 122/80 | HR 57 | Temp 98.0°F | Resp 10 | Ht 63.0 in | Wt 167.0 lb

## 2014-12-02 DIAGNOSIS — E890 Postprocedural hypothyroidism: Secondary | ICD-10-CM | POA: Diagnosis not present

## 2014-12-02 DIAGNOSIS — Z8639 Personal history of other endocrine, nutritional and metabolic disease: Secondary | ICD-10-CM

## 2014-12-02 DIAGNOSIS — I10 Essential (primary) hypertension: Secondary | ICD-10-CM | POA: Diagnosis not present

## 2014-12-02 DIAGNOSIS — I639 Cerebral infarction, unspecified: Secondary | ICD-10-CM | POA: Diagnosis not present

## 2014-12-02 DIAGNOSIS — I6350 Cerebral infarction due to unspecified occlusion or stenosis of unspecified cerebral artery: Secondary | ICD-10-CM

## 2014-12-02 DIAGNOSIS — Z8739 Personal history of other diseases of the musculoskeletal system and connective tissue: Secondary | ICD-10-CM

## 2014-12-02 DIAGNOSIS — IMO0002 Reserved for concepts with insufficient information to code with codable children: Secondary | ICD-10-CM

## 2014-12-02 DIAGNOSIS — F329 Major depressive disorder, single episode, unspecified: Secondary | ICD-10-CM

## 2014-12-02 DIAGNOSIS — I69391 Dysphagia following cerebral infarction: Secondary | ICD-10-CM | POA: Diagnosis not present

## 2014-12-02 DIAGNOSIS — K5901 Slow transit constipation: Secondary | ICD-10-CM

## 2014-12-02 NOTE — Progress Notes (Signed)
Patient ID: Shelia Marks, female   DOB: August 15, 1926, 80 y.o.   MRN: [id number]   Location:  [facility] [place] [facility]  Code Status: full code  No Known Allergies  Chief Complaint  Patient presents with  . Medical Management of Chronic Issues    3 month follow-up, last labs 07/2014 (not fasting)     HPI: Patient is a 80 y.o. black female seen in the office today for medical mgt of chronic diseases.  Not a thing new per patient.  Balance has stayed the same. Took something from the drug store for constipation that is brown on one side and gray on the other.   Only slow moving, not hard stools.  Discussed trying a fiber supplement like benefiber  Has had no gout flares-advised to continue allopurinol to prevent.    Wants to eat just one grapefruit.  Discussed this is ok, but not to make it a habit with her medications.  Review of Systems:  Review of Systems  Constitutional: Negative for fever and chills.  Eyes: Negative for blurred vision.  Respiratory: Negative for cough and shortness of breath.   Cardiovascular: Negative for chest pain and leg swelling.  Gastrointestinal: Positive for constipation. Negative for abdominal pain, diarrhea, blood in stool and melena.  Genitourinary: Negative for dysuria.  Musculoskeletal: Negative for myalgias and falls.  Skin: Negative for rash.  Neurological: Positive for focal weakness. Negative for headaches.       Balance remains poor, using cane  Endo/Heme/Allergies: Does not bruise/bleed easily.  Psychiatric/Behavioral: Positive for memory loss. Negative for depression.       Mood is better, no longer tearful     Past Medical History  Diagnosis Date  . Hypothyroidism   . Cancer   . Hypertension   . Sleep related leg cramps   . Senile osteoporosis   . Malignant neoplasm of thyroid gland   . Nontoxic uninodular goiter   . Dizziness and giddiness   . Other symptoms involving cardiovascular system   .  Alzheimer's disease   . Varicose veins of lower extremities with inflammation   . Disorder of bone and cartilage, unspecified   . Hyperlipidemia LDL goal < 100   . Unspecified disorder of lipoid metabolism   . Unspecified hereditary and idiopathic peripheral neuropathy   . Phlebitis and thrombophlebitis of unspecified site   . Other malaise and fatigue   . Stroke     Past Surgical History  Procedure Laterality Date  . Abdominal hysterectomy      DR HUGES  . Knee surgery      right  . Colon surgery    . Thyroid surgery  July 20, 2011  . Parathyroid adenoma removed  1983  . Catract surgery  2008    Social History:   reports that she has never smoked. She has never used smokeless tobacco. She reports that she does not drink alcohol or use illicit drugs.  Family History  Problem Relation Age of Onset  . Diabetes Brother     Medications: Patient's Medications  New Prescriptions   No medications on file  Previous Medications   ALLOPURINOL (ZYLOPRIM) 300 MG TABLET    Take 1 tablet (300 mg total) by mouth daily.   AMLODIPINE (NORVASC) 10 MG TABLET    Take 1 tablet (10 mg total) by mouth daily.   ASPIRIN 325 MG TABLET    Take 1 tablet (325 mg total) by mouth daily.   ATENOLOL (TENORMIN) 25  MG TABLET    Take 1 tablet (25 mg total) by mouth daily.   ATORVASTATIN (LIPITOR) 20 MG TABLET    TAKE 1 TABLET EVERY DAY  AT  6  PM FOR CHOLESTEROL   CALCIUM CARBONATE (TUMS - DOSED IN MG ELEMENTAL CALCIUM) 500 MG CHEWABLE TABLET    Chew 1 tablet by mouth as needed for indigestion or heartburn.   LEVOTHYROXINE (SYNTHROID, LEVOTHROID) 100 MCG TABLET    TAKE 1 TABLET EVERY DAY ON AN EMPTY STOMACH AND SEPARATED FROM OTHER MEDICATIONS   LISINOPRIL (PRINIVIL,ZESTRIL) 2.5 MG TABLET    Take 1 tablet (2.5 mg total) by mouth daily.  Modified Medications   No medications on file  Discontinued Medications   No medications on file     Physical Exam: Filed Vitals:   12/02/14 1045  BP: 122/80    Pulse: 57  Temp: 98 F (36.7 C)  TempSrc: Oral  Resp: 10  Height: 5\' 3"  (1.6 m)  Weight: 167 lb (75.751 kg)  SpO2: 99%  Physical Exam  Constitutional: She appears well-developed and well-nourished. No distress.  Eyes: Conjunctivae are normal.  Cardiovascular: Normal rate, regular rhythm, normal heart sounds and intact distal pulses.   Pulmonary/Chest: Effort normal and breath sounds normal. No respiratory distress.  Abdominal: Soft. Bowel sounds are normal. She exhibits no distension. There is no tenderness.  Musculoskeletal:  Walks with cane due to leg weakness  Neurological: She is alert.  oriented to person and place, not time  Skin: Skin is warm and dry.  Psychiatric: She has a normal mood and affect.     Labs reviewed: Basic Metabolic Panel:  Recent Labs  03/05/14 0843  03/09/14 0530 03/15/14 0553 08/27/14 1151  NA 143  --  145  --  144  K 3.5*  --  4.2  --  4.1  CL 108  --  107  --  104  CO2 23  --  27  --  26  GLUCOSE 87  --  82  --  87  BUN 20  --  18  --  13  CREATININE 0.94  < > 1.03 0.87 0.99  CALCIUM 9.6  --  9.7  --  10.0  TSH 1.686  --   --   --  1.060  < > = values in this interval not displayed. Liver Function Tests:  Recent Labs  03/05/14 0414 03/05/14 0843 03/09/14 0530  AST 19 16 21   ALT 12 9 13   ALKPHOS 80 67 60  BILITOT 0.6 0.6 0.4  PROT 7.8 7.1 6.4  ALBUMIN 4.0 3.3* 2.9*   No results for input(s): LIPASE, AMYLASE in the last 8760 hours. No results for input(s): AMMONIA in the last 8760 hours. CBC:  Recent Labs  03/05/14 0843 03/08/14 2005 03/09/14 0530 08/27/14 1151  WBC 3.6* 5.3 5.5 4.4  NEUTROABS 2.1  --  2.5 2.3  HGB 11.3* 11.4* 11.6* 12.3  HCT 34.9* 34.9* 36.0 37.6  MCV 92.6 90.9 91.1 92  PLT 152 164 153 210   Lipid Panel:  Recent Labs  03/06/14 0457  CHOL 149  HDL 46  LDLCALC 83  TRIG 101  CHOLHDL 3.2   Lab Results  Component Value Date   HGBA1C 5.1 03/06/2014   Assessment/Plan 1. Right pontine  stroke - cont secondary prevention -cont asa 325mg , statin, bp control - Lipid panel; Future  2. Essential hypertension, benign -cot atenolol and norvasc and lisinopril - CBC With differential/Platelet; Future - Comprehensive metabolic panel; Future  3. Depression due to stroke -has gotten better now without meds  4. Hypothyroidism, postsurgical -cont synthroid - TSH; Future  5. Constipation, slow transit -encouraged use of benefiber daily to help with this -if persists, would try miralax next or linzess  6. Dysphagia as late effect of stroke -still has some difficulty occasionally, but no choking episodes or aspiration  7. History of gout - cont allopurinol - Uric Acid; Future  Labs/tests ordered: Orders Placed This Encounter  Procedures  . CBC With differential/Platelet    Standing Status: Future     Number of Occurrences:      Standing Expiration Date: 08/03/2015  . Comprehensive metabolic panel    Standing Status: Future     Number of Occurrences:      Standing Expiration Date: 08/03/2015    Order Specific Question:  Has the patient fasted?    Answer:  Yes  . Lipid panel    Standing Status: Future     Number of Occurrences:      Standing Expiration Date: 08/03/2015    Order Specific Question:  Has the patient fasted?    Answer:  Yes  . TSH    Standing Status: Future     Number of Occurrences:      Standing Expiration Date: 08/03/2015  . Uric Acid    Standing Status: Future     Number of Occurrences:      Standing Expiration Date: 08/03/2015   Next appt:  4 mos  Cattie Tineo L. Sheniece Ruggles, D.O. [facility] [address] [place], [place] [address] Cell Phone (Mon-Fri 8am-5pm):  [phone] On Call:  [phone] & follow prompts after 5pm & weekends Office Phone:  [phone] Office Fax:  [phone]

## 2014-12-02 NOTE — Patient Instructions (Signed)
Try benefiber to help bowels move.

## 2014-12-06 ENCOUNTER — Telehealth: Payer: Self-pay | Admitting: *Deleted

## 2014-12-06 MED ORDER — SERTRALINE HCL 25 MG PO TABS: ORAL_TABLET | ORAL | Status: DC

## 2014-12-06 MED ORDER — SERTRALINE HCL 50 MG PO TABS
ORAL_TABLET | ORAL | Status: DC
Start: 2014-12-06 — End: 2015-04-07

## 2014-12-06 NOTE — Telephone Encounter (Signed)
She actually told me her mood was a lot better recently at the appointment.  Let's start zoloft 25mg  daily for 2 weeks, then increase to 50mg  daily thereafter.  She should call back if she is not feeling better in 4-6 wks.

## 2014-12-06 NOTE — Telephone Encounter (Signed)
Patient stated that she discussed with you at appointment regarding Depression. She states she needs the medication now due to the [place] and a lot going on. Please Advise.

## 2014-12-06 NOTE — Telephone Encounter (Signed)
Patient Notified and agreed, faxed Rx's to pharmacy.

## 2014-12-16 ENCOUNTER — Other Ambulatory Visit: Payer: Self-pay | Admitting: *Deleted

## 2014-12-16 DIAGNOSIS — I10 Essential (primary) hypertension: Secondary | ICD-10-CM

## 2014-12-16 MED ORDER — LISINOPRIL 2.5 MG PO TABS: ORAL_TABLET | ORAL | Status: DC

## 2014-12-16 MED ORDER — ATORVASTATIN CALCIUM 20 MG PO TABS: ORAL_TABLET | ORAL | Status: DC

## 2014-12-16 NOTE — Telephone Encounter (Signed)
Patient caregiver requested Rx to be faxed to [facility]

## 2015-02-11 DIAGNOSIS — C73 Malignant neoplasm of thyroid gland: Secondary | ICD-10-CM | POA: Diagnosis not present

## 2015-02-11 DIAGNOSIS — E890 Postprocedural hypothyroidism: Secondary | ICD-10-CM | POA: Diagnosis not present

## 2015-02-19 ENCOUNTER — Other Ambulatory Visit: Payer: Self-pay | Admitting: Nurse Practitioner

## 2015-02-19 ENCOUNTER — Other Ambulatory Visit: Payer: Self-pay | Admitting: Internal Medicine

## 2015-03-08 ENCOUNTER — Other Ambulatory Visit: Payer: Self-pay

## 2015-03-08 DIAGNOSIS — Z1231 Encounter for screening mammogram for malignant neoplasm of breast: Secondary | ICD-10-CM

## 2015-03-17 ENCOUNTER — Ambulatory Visit
Admission: RE | Admit: 2015-03-17 | Discharge: 2015-03-17 | Disposition: A | Discharge status: Home / Self Care | Payer: Medicare Other | Source: Ambulatory Visit

## 2015-03-17 DIAGNOSIS — Z1231 Encounter for screening mammogram for malignant neoplasm of breast: Secondary | ICD-10-CM | POA: Diagnosis not present

## 2015-04-05 ENCOUNTER — Other Ambulatory Visit: Payer: Medicare Other

## 2015-04-05 DIAGNOSIS — Z8739 Personal history of other diseases of the musculoskeletal system and connective tissue: Secondary | ICD-10-CM

## 2015-04-05 DIAGNOSIS — I10 Essential (primary) hypertension: Secondary | ICD-10-CM | POA: Diagnosis not present

## 2015-04-05 DIAGNOSIS — Z8639 Personal history of other endocrine, nutritional and metabolic disease: Secondary | ICD-10-CM | POA: Diagnosis not present

## 2015-04-05 DIAGNOSIS — E890 Postprocedural hypothyroidism: Secondary | ICD-10-CM | POA: Diagnosis not present

## 2015-04-05 DIAGNOSIS — I6350 Cerebral infarction due to unspecified occlusion or stenosis of unspecified cerebral artery: Secondary | ICD-10-CM | POA: Diagnosis not present

## 2015-04-06 LAB — CBC WITH DIFFERENTIAL
Basophils Absolute: 0 10*3/uL (ref 0.0–0.2)
Basos: 0 %
Eos: 3 %
Eosinophils Absolute: 0.1 10*3/uL (ref 0.0–0.4)
HCT: 36 % (ref 34.0–46.6)
Hemoglobin: 11.5 g/dL (ref 11.1–15.9)
Immature Grans (Abs): 0 10*3/uL (ref 0.0–0.1)
Immature Granulocytes: 0 %
Lymphocytes Absolute: 1.3 10*3/uL (ref 0.7–3.1)
Lymphs: 29 %
MCH: 28.5 pg (ref 26.6–33.0)
MCHC: 31.9 g/dL (ref 31.5–35.7)
MCV: 89 fL (ref 79–97)
Monocytes Absolute: 0.3 10*3/uL (ref 0.1–0.9)
Monocytes: 6 %
Neutrophils Absolute: 2.6 10*3/uL (ref 1.4–7.0)
Neutrophils Relative %: 62 %
RBC: 4.04 x10E6/uL (ref 3.77–5.28)
RDW: 16.3 % — ABNORMAL HIGH (ref 12.3–15.4)
WBC: 4.3 10*3/uL (ref 3.4–10.8)

## 2015-04-06 LAB — COMPREHENSIVE METABOLIC PANEL
ALT: 11 IU/L (ref 0–32)
AST: 19 IU/L (ref 0–40)
Albumin/Globulin Ratio: 1.6 (ref 1.1–2.5)
Albumin: 4.2 g/dL (ref 3.5–4.7)
Alkaline Phosphatase: 83 IU/L (ref 39–117)
BUN/Creatinine Ratio: 14 (ref 11–26)
BUN: 14 mg/dL (ref 8–27)
Bilirubin Total: 0.5 mg/dL (ref 0.0–1.2)
CO2: 25 mmol/L (ref 18–29)
Calcium: 9.8 mg/dL (ref 8.7–10.3)
Chloride: 102 mmol/L (ref 97–108)
Creatinine, Ser: 0.97 mg/dL (ref 0.57–1.00)
GFR calc Af Amer: 61 mL/min/{1.73_m2} (ref >59)
GFR calc non Af Amer: 53 mL/min/{1.73_m2} — ABNORMAL LOW (ref >59)
Globulin, Total: 2.6 g/dL (ref 1.5–4.5)
Glucose: 91 mg/dL (ref 65–99)
Potassium: 4.1 mmol/L (ref 3.5–5.2)
Sodium: 143 mmol/L (ref 134–144)
Total Protein: 6.8 g/dL (ref 6.0–8.5)

## 2015-04-06 LAB — LIPID PANEL
Chol/HDL Ratio: 3.7 ratio units (ref 0.0–4.4)
Cholesterol, Total: 178 mg/dL (ref 100–199)
HDL: 48 mg/dL (ref >39)
LDL Calculated: 108 mg/dL — ABNORMAL HIGH (ref 0–99)
Triglycerides: 110 mg/dL (ref 0–149)
VLDL Cholesterol Cal: 22 mg/dL (ref 5–40)

## 2015-04-06 LAB — TSH: TSH: 5.98 u[IU]/mL — ABNORMAL HIGH (ref 0.450–4.500)

## 2015-04-06 LAB — URIC ACID: Uric Acid: 3.1 mg/dL (ref 2.5–7.1)

## 2015-04-07 ENCOUNTER — Ambulatory Visit (INDEPENDENT_AMBULATORY_CARE_PROVIDER_SITE_OTHER): Payer: Medicare Other | Admitting: Internal Medicine

## 2015-04-07 ENCOUNTER — Encounter: Payer: Self-pay | Admitting: Internal Medicine

## 2015-04-07 VITALS — BP 130/80 | HR 64 | Temp 97.7°F | Resp 18 | Ht 61.0 in | Wt 162.4 lb

## 2015-04-07 DIAGNOSIS — F329 Major depressive disorder, single episode, unspecified: Secondary | ICD-10-CM

## 2015-04-07 DIAGNOSIS — IMO0002 Reserved for concepts with insufficient information to code with codable children: Secondary | ICD-10-CM

## 2015-04-07 DIAGNOSIS — I6350 Cerebral infarction due to unspecified occlusion or stenosis of unspecified cerebral artery: Secondary | ICD-10-CM | POA: Diagnosis not present

## 2015-04-07 DIAGNOSIS — I10 Essential (primary) hypertension: Secondary | ICD-10-CM

## 2015-04-07 DIAGNOSIS — E785 Hyperlipidemia, unspecified: Secondary | ICD-10-CM | POA: Diagnosis not present

## 2015-04-07 DIAGNOSIS — E890 Postprocedural hypothyroidism: Secondary | ICD-10-CM | POA: Diagnosis not present

## 2015-04-07 MED ORDER — SERTRALINE HCL 50 MG PO TABS: ORAL_TABLET | ORAL | Status: DC

## 2015-04-07 NOTE — Patient Instructions (Signed)
Please use a pillbox to lay out your medications to make sure you don't miss any pills.

## 2015-04-07 NOTE — Progress Notes (Signed)
Patient ID: Shelia Marks, female   DOB: 10-31-1926, 81 y.o.   MRN: [id number]   Location:  [facility]  Code Status: DNR when previously discussed Goals of Care:  Advanced Directive information Does patient have an advance directive?: No, Would patient like information on creating an advanced directive?: Yes - Educational materials given (again today)  No Known Allergies  Chief Complaint  Patient presents with  . Medical Management of Chronic Issues    HPI: Patient is a 81 y.o. black female seen in the office today for med mgt of chronic diseases.  Says nothing is new.    Taking thyroid medicine first thing in am on empty stomach separate from other pills.  Is taking her cholesterol medication also, she says.    SCAT form completed today.  Review of Systems:  Review of Systems  Constitutional: Negative for fever and chills.  HENT: Negative for congestion.   Eyes: Negative for blurred vision.  Respiratory: Negative for shortness of breath.   Cardiovascular: Negative for chest pain.  Gastrointestinal: Negative for abdominal pain, constipation, blood in stool and melena.  Genitourinary: Negative for dysuria, urgency and frequency.  Musculoskeletal: Negative for falls.  Skin: Negative for rash.  Neurological: Positive for sensory change and focal weakness.  Psychiatric/Behavioral: Negative for memory loss.    Past Medical History  Diagnosis Date  . Hypothyroidism   . Cancer   . Hypertension   . Sleep related leg cramps   . Senile osteoporosis   . Malignant neoplasm of thyroid gland   . Nontoxic uninodular goiter   . Dizziness and giddiness   . Other symptoms involving cardiovascular system   . Alzheimer's disease   . Varicose veins of lower extremities with inflammation   . Disorder of bone and cartilage, unspecified   . Hyperlipidemia LDL goal < 100   . Unspecified disorder of lipoid metabolism   . Unspecified hereditary  and idiopathic peripheral neuropathy   . Phlebitis and thrombophlebitis of unspecified site   . Other malaise and fatigue   . Stroke     Past Surgical History  Procedure Laterality Date  . Abdominal hysterectomy      DR HUGES  . Knee surgery      right  . Colon surgery    . Thyroid surgery  July 20, 2011  . Parathyroid adenoma removed  1983  . Catract surgery  2008    Social History:   reports that she has never smoked. She has never used smokeless tobacco. She reports that she does not drink alcohol or use illicit drugs.  Family History  Problem Relation Age of Onset  . Diabetes Brother     Medications: Patient's Medications  New Prescriptions   No medications on file  Previous Medications   ALLOPURINOL (ZYLOPRIM) 300 MG TABLET    TAKE 1 TABLET EVERY DAY   AMLODIPINE (NORVASC) 10 MG TABLET    TAKE 1 TABLET EVERY DAY   ASPIRIN 325 MG TABLET    Take 1 tablet (325 mg total) by mouth daily.   ATENOLOL (TENORMIN) 25 MG TABLET    TAKE 1 TABLET EVERY DAY   ATORVASTATIN (LIPITOR) 20 MG TABLET    Take one tablet by mouth once daily at 6pm for cholesterol   CALCIUM CARBONATE (TUMS - DOSED IN MG ELEMENTAL CALCIUM) 500 MG CHEWABLE TABLET    Chew 1 tablet by mouth as needed for indigestion or heartburn.   LEVOTHYROXINE (SYNTHROID, LEVOTHROID) 100 MCG  TABLET    TAKE 1 TABLET EVERY DAY ON AN EMPTY STOMACH AND SEPARATED FROM OTHER MEDICATIONS   LISINOPRIL (PRINIVIL,ZESTRIL) 2.5 MG TABLET    Take one tablet by mouth once daily to control blood pressure   SERTRALINE (ZOLOFT) 50 MG TABLET    Take one tablet by mouth once daily for depression after taking 25mg   Modified Medications   No medications on file  Discontinued Medications   SERTRALINE (ZOLOFT) 25 MG TABLET    Take one tablet by mouth once daily for 2 weeks for depression then increase to 50mg  thereafter     Physical Exam: Filed Vitals:   04/07/15 1013  BP: 130/80  Pulse: 64  Temp: 97.7 F (36.5 C)  TempSrc: Oral  Resp:  18  Height: 5\' 1"  (1.549 m)  Weight: 162 lb 6.4 oz (73.664 kg)  SpO2: 98%   Physical Exam  Constitutional: She is oriented to person, place, and time. She appears well-developed and well-nourished.  Cardiovascular: Normal rate, regular rhythm and normal heart sounds.   Pulmonary/Chest: Effort normal and breath sounds normal. No respiratory distress.  Abdominal: Soft. Bowel sounds are normal. She exhibits no distension and no mass. There is no tenderness.  Musculoskeletal: Normal range of motion.  Uses cane to ambulate  Neurological: She is alert and oriented to person, place, and time.  Skin: Skin is warm and dry.  Psychiatric: She has a normal mood and affect.    Labs reviewed: Basic Metabolic Panel:  Recent Labs  08/27/14 1151 04/05/15 0942  NA 144 143  K 4.1 4.1  CL 104 102  CO2 26 25  GLUCOSE 87 91  BUN 13 14  CREATININE 0.99 0.97  CALCIUM 10.0 9.8  TSH 1.060 5.980*   Liver Function Tests:  Recent Labs  04/05/15 0942  AST 19  ALT 11  ALKPHOS 83  BILITOT 0.5  PROT 6.8   No results for input(s): LIPASE, AMYLASE in the last 8760 hours. No results for input(s): AMMONIA in the last 8760 hours. CBC:  Recent Labs  08/27/14 1151 04/05/15 0942  WBC 4.4 4.3  NEUTROABS 2.3 2.6  HGB 12.3 11.5  HCT 37.6 36.0  MCV 92 89  PLT 210  --    Lipid Panel:  Recent Labs  04/05/15 0942  CHOL 178  HDL 48  LDLCALC 108*  TRIG 110  CHOLHDL 3.7   Lab Results  Component Value Date   HGBA1C 5.1 03/06/2014    Assessment/Plan 1. Right pontine stroke -still with weakness of her leg -cont use of cane for support -seems she's more accepting now  2. Essential hypertension, benign -bp at goal with current regimen, no changes  3. Hypothyroidism, postsurgical - cont current synthroid, but must take faithfully and I think she is forgetting it sometimes--to use pillbox and have her sister help keep track - TSH; Future  4. Depression due to stroke -improved with  zoloft and her sister adamantly wants this continued  5. Hyperlipidemia -not at goal and had been--suspect she is missing doses of this also so will not increase but advised pillbox use - Lipid panel; Future  Labs/tests ordered:   Orders Placed This Encounter  Procedures  . Lipid panel    Standing Status: Future     Number of Occurrences:      Standing Expiration Date: 10/07/2015    Order Specific Question:  Has the patient fasted?    Answer:  Yes  . TSH    Standing Status: Future  Number of Occurrences:      Standing Expiration Date: 10/07/2015    Next appt:  3 mos with labs before  Aviraj Kentner L. Janmichael Giraud, D.O. [facility] [address],  [address] Cell Phone (Mon-Fri 8am-5pm):  [phone] On Call:  [phone] & follow prompts after 5pm & weekends Office Phone:  [phone] Office Fax:  [phone]

## 2015-04-15 ENCOUNTER — Other Ambulatory Visit: Payer: Self-pay | Admitting: Internal Medicine

## 2015-04-16 ENCOUNTER — Other Ambulatory Visit: Payer: Self-pay | Admitting: Internal Medicine

## 2015-04-27 ENCOUNTER — Other Ambulatory Visit: Payer: Self-pay | Admitting: Internal Medicine

## 2015-05-11 ENCOUNTER — Telehealth: Payer: Self-pay

## 2015-05-11 NOTE — Telephone Encounter (Signed)
Message left on triage voicemail. Dr.'s office would like to make sure it is ok for patient to have sedation on 05/18/15.   I will call tomorrow to clarify what procedure patient will have.

## 2015-05-12 NOTE — Telephone Encounter (Signed)
I called to clarify what procedure patient will have completed.  Patient is having 5 teeth extracted and removal of her tori (extra bone in mouth). Question if it is ok to sedate patient with Versed and fentanyl, please advise?

## 2015-05-13 NOTE — Telephone Encounter (Signed)
This sedation should be acceptable.  I suspect she will be quite confused postop.  She has dementia.  Her sister should plan to stay with her for a couple of days after this to make sure she is safe.

## 2015-05-13 NOTE — Telephone Encounter (Signed)
Left message on voicemail with Dr.Reed's instructions. Instructed Elmyra Ricks to call me back if she needs me to fax her this phone note.

## 2015-06-03 ENCOUNTER — Other Ambulatory Visit: Payer: Self-pay | Admitting: *Deleted

## 2015-06-03 DIAGNOSIS — I10 Essential (primary) hypertension: Secondary | ICD-10-CM

## 2015-06-03 MED ORDER — AMLODIPINE BESYLATE 10 MG PO TABS: 10.0000 mg | ORAL_TABLET | Freq: Every day | ORAL | Status: DC

## 2015-06-03 MED ORDER — ATORVASTATIN CALCIUM 20 MG PO TABS: ORAL_TABLET | ORAL | Status: DC

## 2015-06-03 MED ORDER — LISINOPRIL 2.5 MG PO TABS: ORAL_TABLET | ORAL | Status: DC

## 2015-06-03 MED ORDER — SERTRALINE HCL 50 MG PO TABS: ORAL_TABLET | ORAL | Status: DC

## 2015-06-03 MED ORDER — ATENOLOL 25 MG PO TABS: 25.0000 mg | ORAL_TABLET | Freq: Every day | ORAL | Status: DC

## 2015-06-03 MED ORDER — ALLOPURINOL 300 MG PO TABS: 300.0000 mg | ORAL_TABLET | Freq: Every day | ORAL | Status: DC

## 2015-06-03 NOTE — Telephone Encounter (Signed)
Patient sister requested Rx's to be faxed to [facility].

## 2015-07-11 ENCOUNTER — Encounter: Payer: Self-pay | Admitting: Internal Medicine

## 2015-07-11 ENCOUNTER — Ambulatory Visit (INDEPENDENT_AMBULATORY_CARE_PROVIDER_SITE_OTHER): Payer: Medicare Other | Admitting: Internal Medicine

## 2015-07-11 VITALS — BP 128/88 | HR 60 | Temp 98.4°F | Resp 20 | Ht 61.0 in | Wt 154.8 lb

## 2015-07-11 DIAGNOSIS — I6350 Cerebral infarction due to unspecified occlusion or stenosis of unspecified cerebral artery: Secondary | ICD-10-CM | POA: Diagnosis not present

## 2015-07-11 DIAGNOSIS — D649 Anemia, unspecified: Secondary | ICD-10-CM | POA: Diagnosis not present

## 2015-07-11 DIAGNOSIS — R634 Abnormal weight loss: Secondary | ICD-10-CM

## 2015-07-11 DIAGNOSIS — E890 Postprocedural hypothyroidism: Secondary | ICD-10-CM

## 2015-07-11 DIAGNOSIS — H9193 Unspecified hearing loss, bilateral: Secondary | ICD-10-CM

## 2015-07-11 DIAGNOSIS — R410 Disorientation, unspecified: Secondary | ICD-10-CM

## 2015-07-11 DIAGNOSIS — J0100 Acute maxillary sinusitis, unspecified: Secondary | ICD-10-CM

## 2015-07-11 DIAGNOSIS — E785 Hyperlipidemia, unspecified: Secondary | ICD-10-CM

## 2015-07-11 DIAGNOSIS — R630 Anorexia: Secondary | ICD-10-CM

## 2015-07-11 MED ORDER — AMOXICILLIN-POT CLAVULANATE 875-125 MG PO TABS: 1.0000 | ORAL_TABLET | Freq: Two times a day (BID) | ORAL | Status: DC

## 2015-07-11 NOTE — Patient Instructions (Addendum)
Keep appt Thursday.  Labs were all done today.  Eat yogurt each day while taking your augmentin antibiotic.    Drink 6-8 glasses of water per day.

## 2015-07-11 NOTE — Progress Notes (Signed)
Patient ID: Shelia Marks, female   DOB: 11/15/1926, 81 y.o.   MRN: [id number]   Location:  [facility]  Code Status: DNR Goals of Care: Advanced Directive information Does patient have an advance directive?: Yes, Type of Advance Directive: [place], Does patient want to make changes to advanced directive?: No - Patient declined   Chief Complaint  Patient presents with  . Acute Visit    cold for almost 2 wks, confusion, weakness, sister questions if she's had a mini-stroke    HPI: Patient is a 81 y.o. black female seen in the office today for an acute visit due to change in mental status, weakness and cold symptoms for past two weeks.  Both legs are weak.  Her sister questions if she's had a mini stroke b/c of the weakness and confusion.  Pt admits to cough, head congestion and difficulty hearing more out of her left than right ear for the past 1.5-2wks.  No sore throat, fever, chills, headache.  Her intake is poor, too, but she had several teeth extracted and is supposed to go get a new plate mold done this week.  She is tearful and depressed about not having teeth.  She is very weak and off balance.  She has no urgency, frequency or dysuria.  She has no abdominal pain, n/v/d or back/flank pain.  She had a great deal of difficulty getting up onto the exam table.  She was walking with a cane unsteadily.  She is staying with her sister right now.    Review of Systems:  Review of Systems  Constitutional: Negative for fever and chills.  HENT: Positive for congestion, ear pain and hearing loss. Negative for ear discharge, sore throat and tinnitus.   Eyes: Negative for blurred vision.  Respiratory: Positive for cough and sputum production. Negative for shortness of breath.   Cardiovascular: Negative for chest pain and leg swelling.  Gastrointestinal: Negative for nausea, vomiting, abdominal pain, diarrhea, constipation, blood in  stool and melena.  Genitourinary: Negative for dysuria, urgency, frequency, hematuria and flank pain.  Musculoskeletal: Negative for falls.  Neurological: Positive for focal weakness. Negative for dizziness, tingling, tremors, sensory change, speech change, seizures, loss of consciousness, weakness and headaches.  Psychiatric/Behavioral: Positive for memory loss.       Memory much worse and sister concerned.    Past Medical History  Diagnosis Date  . Hypothyroidism   . Cancer   . Hypertension   . Sleep related leg cramps   . Senile osteoporosis   . Malignant neoplasm of thyroid gland   . Nontoxic uninodular goiter   . Dizziness and giddiness   . Other symptoms involving cardiovascular system   . Alzheimer's disease   . Varicose veins of lower extremities with inflammation   . Disorder of bone and cartilage, unspecified   . Hyperlipidemia LDL goal < 100   . Unspecified disorder of lipoid metabolism   . Unspecified hereditary and idiopathic peripheral neuropathy   . Phlebitis and thrombophlebitis of unspecified site   . Other malaise and fatigue   . Stroke     Past Surgical History  Procedure Laterality Date  . Abdominal hysterectomy      DR HUGES  . Knee surgery      right  . Colon surgery    . Thyroid surgery  July 20, 2011  . Parathyroid adenoma removed  1983  . Catract surgery  2008    No Known  Allergies Medications: Patient's Medications  New Prescriptions   AMOXICILLIN-CLAVULANATE (AUGMENTIN) 875-125 MG PER TABLET    Take 1 tablet by mouth 2 (two) times daily.  Previous Medications   ALLOPURINOL (ZYLOPRIM) 300 MG TABLET    Take 1 tablet (300 mg total) by mouth daily.   AMLODIPINE (NORVASC) 10 MG TABLET    Take 1 tablet (10 mg total) by mouth daily.   ASPIRIN 325 MG TABLET    Take 1 tablet (325 mg total) by mouth daily.   ATENOLOL (TENORMIN) 25 MG TABLET    Take 1 tablet (25 mg total) by mouth daily.   ATORVASTATIN (LIPITOR) 20 MG TABLET    Take one tablet by  mouth once daily at 6pm for cholesterol   CALCIUM CARBONATE (TUMS - DOSED IN MG ELEMENTAL CALCIUM) 500 MG CHEWABLE TABLET    Chew 1 tablet by mouth as needed for indigestion or heartburn.   LEVOTHYROXINE (SYNTHROID, LEVOTHROID) 100 MCG TABLET    TAKE 1 TABLET EVERY DAY ON AN EMPTY STOMACH AND SEPARATED FROM OTHER MEDICATIONS   LISINOPRIL (PRINIVIL,ZESTRIL) 2.5 MG TABLET    Take one tablet by mouth once daily to control blood pressure   SERTRALINE (ZOLOFT) 50 MG TABLET    Take one tablet by mouth once daily for depression  Modified Medications   No medications on file  Discontinued Medications   No medications on file    Physical Exam: Filed Vitals:   07/11/15 1155  BP: 128/88  Pulse: 60  Temp: 98.4 F (36.9 C)  TempSrc: Oral  Resp: 20  Height: 5\' 1"  (1.549 m)  Weight: 154 lb 12.8 oz (70.217 kg)   Physical Exam  Constitutional: She appears well-developed and well-nourished.  Appears unsteady and dry on gross exam  HENT:  Head: Normocephalic and atraumatic.  Bilateral ears with TMS erythematous and dull; no significant cerumen present  Eyes: EOM are normal. Pupils are equal, round, and reactive to light.  Cardiovascular: Normal rate, regular rhythm, normal heart sounds and intact distal pulses.   Pulmonary/Chest: Effort normal and breath sounds normal. No respiratory distress. She has no wheezes. She has no rales.  Abdominal: Soft. Bowel sounds are normal. She exhibits no distension. There is no tenderness.  Genitourinary:  No suprapubic or flank tenderness  Musculoskeletal: She exhibits no edema or tenderness.  Chronic left lower extremity weakness; other extremities 5/5, normal 2+ patellar reflexes bilaterally  Neurological: She is alert. She has normal reflexes. No cranial nerve deficit.  Skin: Skin is warm and dry.  Psychiatric:  Tearful during visit    Labs reviewed: Basic Metabolic Panel:  Recent Labs  08/27/14 1151 04/05/15 0942  NA 144 143  K 4.1 4.1  CL 104  102  CO2 26 25  GLUCOSE 87 91  BUN 13 14  CREATININE 0.99 0.97  CALCIUM 10.0 9.8  TSH 1.060 5.980*   Liver Function Tests:  Recent Labs  04/05/15 0942  AST 19  ALT 11  ALKPHOS 83  BILITOT 0.5  PROT 6.8   No results for input(s): LIPASE, AMYLASE in the last 8760 hours. No results for input(s): AMMONIA in the last 8760 hours. CBC:  Recent Labs  08/27/14 1151 04/05/15 0942  WBC 4.4 4.3  NEUTROABS 2.3 2.6  HGB 12.3 11.5  HCT 37.6 36.0  MCV 92 89  PLT 210  --    Lipid Panel:  Recent Labs  04/05/15 0942  CHOL 178  HDL 48  LDLCALC 108*  TRIG 110  CHOLHDL 3.7  Lab Results  Component Value Date   HGBA1C 5.1 03/06/2014    Assessment/Plan 1. Acute maxillary sinusitis, recurrence not specified - over past two weeks with decreased hearing, cough, congestion and confusion, poor intake, lungs clear - Comprehensive metabolic panel - CBC with Differential/Platelet - amoxicillin-clavulanate (AUGMENTIN) 875-125 MG per tablet; Take 1 tablet by mouth 2 (two) times daily.  Dispense: 20 tablet; Refill: 0 ordered to treat due to associated delirium and hearing loss, erythematous, dull TMs -take with yogurt daily to prevent diarrhea and yeast infections  2. Acute delirium -possibly secondary to sinus infection, but basic lab workup ordered -has no s/s of UTI so UA c+s not done - no other localizing symptoms to explain and no change in neurologic exam aside from "generalized weakness" -cont to stay with her sister so she can be monitored -keep appt in 3 days for reassessment--may need CT brain and ENT referral if not improving with abx - Comprehensive metabolic panel - CBC with Differential/Platelet  3. Poor appetite -due to URI it seems, has lost weight -encouraged her to eat more--also limited by recent tooth extractions  4. Loss of weight -multifactorial -tooth extractions, URI, depression, dementia -regular labs that were to be done tomorrow including tsh, lipid  were done today -is on zoloft and may need to increase dosage again  5. Hypothyroidism, postsurgical - reassess due to weight loss: - TSH  6. Hearing loss, bilateral -seems left has worsened more than right acutely--?due to pressure in ears and fluid from sinus infection -treat it and see if improves--if not, refer to ENT  7. Hyperlipidemia - cont on lipitor for this and see what lab looks like - Lipid panel  Labs/tests ordered:  Orders Placed This Encounter  Procedures  . Comprehensive metabolic panel  . CBC with Differential/Platelet  tsh, lipid  Next appt:  Keep on 7/14  Tecora Eustache L. Rishik Tubby, D.O. [facility] [address], [place] [address] Cell Phone (Mon-Fri 8am-5pm):  [phone] On Call:  [phone] & follow prompts after 5pm & weekends Office Phone:  [phone] Office Fax:  [phone]

## 2015-07-12 ENCOUNTER — Other Ambulatory Visit: Payer: Medicare Other

## 2015-07-12 LAB — COMPREHENSIVE METABOLIC PANEL
ALT: 12 IU/L (ref 0–32)
AST: 16 IU/L (ref 0–40)
Albumin/Globulin Ratio: 1.2 (ref 1.1–2.5)
Albumin: 3.8 g/dL (ref 3.5–4.7)
Alkaline Phosphatase: 70 IU/L (ref 39–117)
BUN/Creatinine Ratio: 21 (ref 11–26)
BUN: 18 mg/dL (ref 8–27)
Bilirubin Total: 0.4 mg/dL (ref 0.0–1.2)
CO2: 26 mmol/L (ref 18–29)
Calcium: 9.5 mg/dL (ref 8.7–10.3)
Chloride: 103 mmol/L (ref 97–108)
Creatinine, Ser: 0.85 mg/dL (ref 0.57–1.00)
GFR calc Af Amer: 72 mL/min/{1.73_m2} (ref >59)
GFR calc non Af Amer: 62 mL/min/{1.73_m2} (ref >59)
Globulin, Total: 3.2 g/dL (ref 1.5–4.5)
Glucose: 82 mg/dL (ref 65–99)
Potassium: 3.8 mmol/L (ref 3.5–5.2)
Sodium: 144 mmol/L (ref 134–144)
Total Protein: 7 g/dL (ref 6.0–8.5)

## 2015-07-12 LAB — CBC WITH DIFFERENTIAL/PLATELET
Basophils Absolute: 0 10*3/uL (ref 0.0–0.2)
Basos: 1 %
EOS (ABSOLUTE): 0.2 10*3/uL (ref 0.0–0.4)
Eos: 3 %
Hematocrit: 31.6 % — ABNORMAL LOW (ref 34.0–46.6)
Hemoglobin: 9.6 g/dL — ABNORMAL LOW (ref 11.1–15.9)
Immature Grans (Abs): 0 10*3/uL (ref 0.0–0.1)
Immature Granulocytes: 0 %
Lymphocytes Absolute: 1.7 10*3/uL (ref 0.7–3.1)
Lymphs: 30 %
MCH: 27.6 pg (ref 26.6–33.0)
MCHC: 30.4 g/dL — ABNORMAL LOW (ref 31.5–35.7)
MCV: 91 fL (ref 79–97)
Monocytes Absolute: 0.5 10*3/uL (ref 0.1–0.9)
Monocytes: 8 %
Neutrophils Absolute: 3.5 10*3/uL (ref 1.4–7.0)
Neutrophils: 58 %
Platelets: 310 10*3/uL (ref 150–379)
RBC: 3.48 x10E6/uL — ABNORMAL LOW (ref 3.77–5.28)
RDW: 16.4 % — ABNORMAL HIGH (ref 12.3–15.4)
WBC: 5.9 10*3/uL (ref 3.4–10.8)

## 2015-07-12 LAB — LIPID PANEL
Chol/HDL Ratio: 4.3 ratio units (ref 0.0–4.4)
Cholesterol, Total: 167 mg/dL (ref 100–199)
HDL: 39 mg/dL — ABNORMAL LOW (ref >39)
LDL Calculated: 107 mg/dL — ABNORMAL HIGH (ref 0–99)
Triglycerides: 105 mg/dL (ref 0–149)
VLDL Cholesterol Cal: 21 mg/dL (ref 5–40)

## 2015-07-12 LAB — TSH: TSH: 1.37 u[IU]/mL (ref 0.450–4.500)

## 2015-07-14 ENCOUNTER — Encounter: Payer: Self-pay | Admitting: Internal Medicine

## 2015-07-14 ENCOUNTER — Ambulatory Visit (INDEPENDENT_AMBULATORY_CARE_PROVIDER_SITE_OTHER): Payer: Medicare Other | Admitting: Internal Medicine

## 2015-07-14 VITALS — BP 122/76 | HR 67 | Temp 98.3°F | Resp 20 | Ht 61.0 in | Wt 155.2 lb

## 2015-07-14 DIAGNOSIS — R531 Weakness: Secondary | ICD-10-CM

## 2015-07-14 DIAGNOSIS — H9193 Unspecified hearing loss, bilateral: Secondary | ICD-10-CM

## 2015-07-14 DIAGNOSIS — R634 Abnormal weight loss: Secondary | ICD-10-CM

## 2015-07-14 DIAGNOSIS — I6350 Cerebral infarction due to unspecified occlusion or stenosis of unspecified cerebral artery: Secondary | ICD-10-CM | POA: Diagnosis not present

## 2015-07-14 DIAGNOSIS — R630 Anorexia: Secondary | ICD-10-CM | POA: Diagnosis not present

## 2015-07-14 DIAGNOSIS — F0150 Vascular dementia without behavioral disturbance: Secondary | ICD-10-CM

## 2015-07-14 DIAGNOSIS — K921 Melena: Secondary | ICD-10-CM | POA: Diagnosis not present

## 2015-07-14 DIAGNOSIS — R41841 Cognitive communication deficit: Secondary | ICD-10-CM

## 2015-07-14 DIAGNOSIS — J0100 Acute maxillary sinusitis, unspecified: Secondary | ICD-10-CM

## 2015-07-14 DIAGNOSIS — D62 Acute posthemorrhagic anemia: Secondary | ICD-10-CM

## 2015-07-14 DIAGNOSIS — R4189 Other symptoms and signs involving cognitive functions and awareness: Secondary | ICD-10-CM

## 2015-07-14 LAB — IRON AND TIBC
Iron Saturation: 25 % (ref 15–55)
Iron: 57 ug/dL (ref 27–139)
Total Iron Binding Capacity: 230 ug/dL — ABNORMAL LOW (ref 250–450)
UIBC: 173 ug/dL (ref 118–369)

## 2015-07-14 LAB — FERRITIN: Ferritin: 263 ng/mL — ABNORMAL HIGH (ref 15–150)

## 2015-07-14 LAB — SPECIMEN STATUS REPORT

## 2015-07-14 MED ORDER — POLYSACCHARIDE IRON COMPLEX 150 MG PO CAPS: 150.0000 mg | ORAL_CAPSULE | Freq: Every day | ORAL | Status: DC

## 2015-07-14 MED ORDER — ASPIRIN EC 81 MG PO TBEC: 81.0000 mg | DELAYED_RELEASE_TABLET | Freq: Every day | ORAL | Status: AC

## 2015-07-14 NOTE — Progress Notes (Signed)
Patient ID: Shelia Marks, female   DOB: 04/26/1926, 81 y.o.   MRN: [id number]   Location:  [facility]  Code Status: DNR Goals of Care: Advanced Directive information Does patient have an advance directive?: Yes, Type of Advance Directive: [place], Does patient want to make changes to advanced directive?: No - Patient declined   Chief Complaint  Patient presents with  . Medical Management of Chronic Issues    3 month f/u  . Acute Visit    new anemia, weakness, worsening mentation    HPI: Patient is a 81 y.o. black female seen in the office today for her 3 month f/u to manage chronic diseases, but is seen primarily to deal with acute concerns of new anemia, weakness, worsening of hearing and confusion.  Her sister [name] is with her to help with history.  I started her on abx for a sinus infection due to decreased hearing, head congestions, initial sore throat.  Head is less congested and pt says she can hear better, but her sister and I note no difference.  Seems she hears but doesn't comprehend?  Neuro exam unchanged from baseline.    She admits to some dark colored, black stools and occasional bright red blood.  She says she thought it was her hemorrhoids.  She does sometimes have abdominal pain after a BM.    Mentation remains altered vs. A few mos ago.  She is very weak and off balance.  Discussion with her and [name] and they want to avoid invasive procedures to assess this.  Jeidy says she does not want to be brought back if her heart stops or she stops breathing and they requested I complete the DNR paperwork today.    She is staying with her sister right now who is looking after her and making her meals.  She has lost 13 lbs over the past year, but has already gained since staying with her sister.    Review of Systems:  Review of Systems  Constitutional: Positive for malaise/fatigue. Negative for fever and  chills.  HENT: Positive for hearing loss. Negative for congestion, ear discharge, ear pain and sore throat.   Eyes: Negative for blurred vision.  Respiratory: Negative for cough and shortness of breath.   Cardiovascular: Negative for chest pain and leg swelling.  Gastrointestinal: Positive for abdominal pain, blood in stool and melena. Negative for heartburn, nausea, vomiting, diarrhea and constipation.  Genitourinary: Negative for dysuria and urgency.  Musculoskeletal: Negative for myalgias and falls.       Near falls frequently  Skin: Negative for rash.  Neurological: Positive for dizziness and weakness. Negative for tremors, loss of consciousness and headaches.       Unsteady; chronic weakness of left leg  Psychiatric/Behavioral: Positive for memory loss.    Past Medical History  Diagnosis Date  . Hypothyroidism   . Cancer   . Hypertension   . Sleep related leg cramps   . Senile osteoporosis   . Malignant neoplasm of thyroid gland   . Nontoxic uninodular goiter   . Dizziness and giddiness   . Other symptoms involving cardiovascular system   . Alzheimer's disease   . Varicose veins of lower extremities with inflammation   . Disorder of bone and cartilage, unspecified   . Hyperlipidemia LDL goal < 100   . Unspecified disorder of lipoid metabolism   . Unspecified hereditary and idiopathic peripheral neuropathy   . Phlebitis and thrombophlebitis of  unspecified site   . Other malaise and fatigue   . Stroke     Past Surgical History  Procedure Laterality Date  . Abdominal hysterectomy      DR HUGES  . Knee surgery      right  . Colon surgery    . Thyroid surgery  July 20, 2011  . Parathyroid adenoma removed  1983  . Catract surgery  2008    No Known Allergies Medications: Patient's Medications  New Prescriptions   ASPIRIN EC 81 MG TABLET    Take 1 tablet (81 mg total) by mouth daily.   IRON POLYSACCHARIDES (NU-IRON) 150 MG CAPSULE    Take 1 capsule (150 mg total) by  mouth daily.  Previous Medications   ALLOPURINOL (ZYLOPRIM) 300 MG TABLET    Take 1 tablet (300 mg total) by mouth daily.   AMLODIPINE (NORVASC) 10 MG TABLET    Take 1 tablet (10 mg total) by mouth daily.   AMOXICILLIN-CLAVULANATE (AUGMENTIN) 875-125 MG PER TABLET    Take 1 tablet by mouth 2 (two) times daily.   ATENOLOL (TENORMIN) 25 MG TABLET    Take 1 tablet (25 mg total) by mouth daily.   ATORVASTATIN (LIPITOR) 20 MG TABLET    Take one tablet by mouth once daily at 6pm for cholesterol   CALCIUM CARBONATE (TUMS - DOSED IN MG ELEMENTAL CALCIUM) 500 MG CHEWABLE TABLET    Chew 1 tablet by mouth as needed for indigestion or heartburn.   LEVOTHYROXINE (SYNTHROID, LEVOTHROID) 100 MCG TABLET    TAKE 1 TABLET EVERY DAY ON AN EMPTY STOMACH AND SEPARATED FROM OTHER MEDICATIONS   LISINOPRIL (PRINIVIL,ZESTRIL) 2.5 MG TABLET    Take one tablet by mouth once daily to control blood pressure   SERTRALINE (ZOLOFT) 50 MG TABLET    Take one tablet by mouth once daily for depression  Modified Medications   No medications on file  Discontinued Medications   ASPIRIN 325 MG TABLET    Take 1 tablet (325 mg total) by mouth daily.    Physical Exam: Filed Vitals:   07/14/15 1043  BP: 122/76  Pulse: 67  Temp: 98.3 F (36.8 C)  TempSrc: Oral  Resp: 20  Height: 5\' 1"  (1.549 m)  Weight: 155 lb 3.2 oz (70.398 kg)  SpO2: 97%   Physical Exam  Constitutional:  Unsteady black female walking with cane and support of wall  HENT:  Right Ear: External ear normal.  Left Ear: External ear normal.  Nose: Nose normal.  Mouth/Throat: Oropharynx is clear and moist. No oropharyngeal exudate.  TMs still dull, but no longer has erythema of TMs or canals  Cardiovascular: Normal rate, regular rhythm, normal heart sounds and intact distal pulses.   Pulmonary/Chest: Effort normal and breath sounds normal.  Abdominal: Soft. Bowel sounds are normal. She exhibits no distension. There is no tenderness.  Genitourinary: Guaiac  negative stool.  External hemorrhoids present; no mass palpable, soft light brown stool was present in rectal vault and heme negative  Musculoskeletal: Normal range of motion.  Chronic left leg 4+/5 vs 5/5 other extremities  Neurological: She is alert.  Oriented to person and place  Skin: Skin is warm and dry.    Labs reviewed: Basic Metabolic Panel:  Recent Labs  08/27/14 1151 04/05/15 0942 07/11/15 1232  NA 144 143 144  K 4.1 4.1 3.8  CL 104 102 103  CO2 26 25 26   GLUCOSE 87 91 82  BUN 13 14 18   CREATININE 0.99 0.97  0.85  CALCIUM 10.0 9.8 9.5  TSH 1.060 5.980* 1.370   Liver Function Tests:  Recent Labs  04/05/15 0942 07/11/15 1232  AST 19 16  ALT 11 12  ALKPHOS 83 70  BILITOT 0.5 0.4  PROT 6.8 7.0   No results for input(s): LIPASE, AMYLASE in the last 8760 hours. No results for input(s): AMMONIA in the last 8760 hours. CBC:  Recent Labs  08/27/14 1151 04/05/15 0942 07/11/15 1232  WBC 4.4 4.3 5.9  NEUTROABS 2.3 2.6 3.5  HGB 12.3 11.5  --   HCT 37.6 36.0 31.6*  MCV 92 89  --   PLT 210  --   --    Lipid Panel:  Recent Labs  04/05/15 0942 07/11/15 1232  CHOL 178 167  HDL 48 39*  LDLCALC 108* 107*  TRIG 110 105  CHOLHDL 3.7 4.3   Lab Results  Component Value Date   HGBA1C 5.1 03/06/2014   Assessment/Plan 1. Acute hearing loss of both ears - will r/o stroke as cause b/c her sister and I don't see any improvement with treatment of her sinus infection - CT Head Wo Contrast; Future--if this is unremarkable, will refer to ENT  2. Acute blood loss anemia - over 2pt drop in hgb with episodes of melena  -hemoccult was negative -will due some noninvasive testing first to see if anything visible on imaging b/c she does not want cscope or other invasive tests - CT Abdomen Pelvis W Wo Contrast; Future - start iron polysaccharides (NU-IRON) 150 MG capsule; Take 1 capsule (150 mg total) by mouth daily.  Dispense: 30 capsule; Refill: 3  3. Generalized  weakness - is progressive and some of it is likely due to malnutrition from her dementia and not being able to take care of herself at home, but she had been refusing help -will assess: - CT Head Wo Contrast; Future - CT Abdomen Pelvis W Wo Contrast; Future  4. Poor appetite - r/o GI mass as cause but could simply be from her advancing dementia - CT Head Wo Contrast; Future - CT Abdomen Pelvis W Wo Contrast; Future  5. Acute cognitive decline - is suddenly worse--had been gradually progressive--no new focal deficits, but does have acute hearing loss not improving with treatment of sinus infection so obtain ct brain to look for stroke involving that area of her brainstem - CT Head Wo Contrast; Future  6. Acute maxillary sinusitis, recurrence not specified -complete abx course - CT Head Wo Contrast; Future  7. Loss of weight -r/o gi malignancy as cause, but could be all b/c of progressing dementia  - CT Head Wo Contrast; Future - CT Abdomen Pelvis W Wo Contrast; Future  8. Melena -describes this, but stool was normal today and heme negative -obtain imaging first due to desire to avoid invasive testing - CT Abdomen Pelvis W Wo Contrast; Future  9. Vascular dementia, without behavioral disturbance -is progressing, suddenly quite rapidly so r/o recent stroke - CT Head Wo Contrast; Future - also will reduce her asa dose due to the acute anemia from 325mg  to 81mg --risk of stroke from embolic event explained to her and her sister -aspirin EC 81 MG tablet; Take 1 tablet (81 mg total) by mouth daily.  Dispense: 30 tablet; Refill: 3   Labs/tests ordered: Orders Placed This Encounter  Procedures  . CT Head Wo Contrast    Wt-155/no needs/ins-humana mcr/np/pt's sister with epic order    Standing Status: Future     Number of  Occurrences:      Standing Expiration Date: 10/13/2016    Order Specific Question:  Reason for Exam (SYMPTOM  OR DIAGNOSIS REQUIRED)    Answer:  cognitive decline,  acute hearing loss    Order Specific Question:  Preferred imaging location?    Answer:  [facility]  . CT Abdomen Pelvis W Wo Contrast    Labs 07-11-15 in epic/Not diabetic or prediabetic/no kidney ca,dz,or solitary/nkda to iv/pt walks with cane/ins-humana mcr/np/pt's sister with epic order Pt to pick up oral contrast    Standing Status: Future     Number of Occurrences:      Standing Expiration Date: 10/13/2016    Order Specific Question:  Reason for exam:    Answer:  acute blood loss anemia, melena off and on, weakness, poor appetite    Order Specific Question:  Preferred imaging location?    Answer:  [facility]    Next appt:  2 wks f/u on anemia, memory loss  Houa Nie L. Judye Lorino, D.O. [facility] [address], [place] [address] Cell Phone (Mon-Fri 8am-5pm):  [phone] On Call:  [phone] & follow prompts after 5pm & weekends Office Phone:  [phone] Office Fax:  [phone]

## 2015-07-22 ENCOUNTER — Other Ambulatory Visit: Payer: Self-pay | Admitting: Internal Medicine

## 2015-07-22 ENCOUNTER — Ambulatory Visit
Admission: RE | Admit: 2015-07-22 | Discharge: 2015-07-22 | Disposition: A | Discharge status: Home / Self Care | Payer: Medicare Other | Source: Ambulatory Visit | Attending: Internal Medicine | Admitting: Internal Medicine

## 2015-07-22 DIAGNOSIS — R41841 Cognitive communication deficit: Secondary | ICD-10-CM

## 2015-07-22 DIAGNOSIS — H9193 Unspecified hearing loss, bilateral: Secondary | ICD-10-CM

## 2015-07-22 DIAGNOSIS — I714 Abdominal aortic aneurysm, without rupture: Secondary | ICD-10-CM | POA: Diagnosis not present

## 2015-07-22 DIAGNOSIS — R634 Abnormal weight loss: Secondary | ICD-10-CM

## 2015-07-22 DIAGNOSIS — R42 Dizziness and giddiness: Secondary | ICD-10-CM | POA: Diagnosis not present

## 2015-07-22 DIAGNOSIS — K429 Umbilical hernia without obstruction or gangrene: Secondary | ICD-10-CM | POA: Diagnosis not present

## 2015-07-22 DIAGNOSIS — R531 Weakness: Secondary | ICD-10-CM

## 2015-07-22 DIAGNOSIS — H9123 Sudden idiopathic hearing loss, bilateral: Secondary | ICD-10-CM

## 2015-07-22 DIAGNOSIS — K921 Melena: Secondary | ICD-10-CM

## 2015-07-22 DIAGNOSIS — J0100 Acute maxillary sinusitis, unspecified: Secondary | ICD-10-CM

## 2015-07-22 DIAGNOSIS — R630 Anorexia: Secondary | ICD-10-CM

## 2015-07-22 DIAGNOSIS — D62 Acute posthemorrhagic anemia: Secondary | ICD-10-CM

## 2015-07-22 DIAGNOSIS — R4189 Other symptoms and signs involving cognitive functions and awareness: Secondary | ICD-10-CM

## 2015-07-22 DIAGNOSIS — F0150 Vascular dementia without behavioral disturbance: Secondary | ICD-10-CM

## 2015-07-22 MED ORDER — IOPAMIDOL (ISOVUE-300) INJECTION 61%
100.0000 mL | Freq: Once | INTRAVENOUS | Status: AC | PRN
  Administered 2015-07-22: 100 mL via INTRAVENOUS

## 2015-07-28 ENCOUNTER — Ambulatory Visit (INDEPENDENT_AMBULATORY_CARE_PROVIDER_SITE_OTHER): Payer: Medicare Other | Admitting: Internal Medicine

## 2015-07-28 ENCOUNTER — Encounter: Payer: Self-pay | Admitting: Internal Medicine

## 2015-07-28 VITALS — BP 144/80 | HR 68 | Temp 98.1°F | Resp 20 | Ht 61.0 in | Wt 158.2 lb

## 2015-07-28 DIAGNOSIS — F329 Major depressive disorder, single episode, unspecified: Secondary | ICD-10-CM

## 2015-07-28 DIAGNOSIS — F0150 Vascular dementia without behavioral disturbance: Secondary | ICD-10-CM | POA: Diagnosis not present

## 2015-07-28 DIAGNOSIS — R41841 Cognitive communication deficit: Secondary | ICD-10-CM

## 2015-07-28 DIAGNOSIS — IMO0002 Reserved for concepts with insufficient information to code with codable children: Secondary | ICD-10-CM

## 2015-07-28 DIAGNOSIS — R634 Abnormal weight loss: Secondary | ICD-10-CM | POA: Diagnosis not present

## 2015-07-28 DIAGNOSIS — D62 Acute posthemorrhagic anemia: Secondary | ICD-10-CM

## 2015-07-28 DIAGNOSIS — R4189 Other symptoms and signs involving cognitive functions and awareness: Secondary | ICD-10-CM

## 2015-07-28 DIAGNOSIS — I6350 Cerebral infarction due to unspecified occlusion or stenosis of unspecified cerebral artery: Secondary | ICD-10-CM

## 2015-07-28 DIAGNOSIS — R531 Weakness: Secondary | ICD-10-CM | POA: Diagnosis not present

## 2015-07-28 NOTE — Progress Notes (Signed)
Patient ID: Shelia Marks, female   DOB: 1926/04/08, 81 y.o.   MRN: [id number]   Location:  [facility]  Code Status: DNR Goals of Care: Advanced Directive information Does patient have an advance directive?: Yes, Type of Advance Directive: [place], Does patient want to make changes to advanced directive?: No - Patient declined   Chief Complaint  Patient presents with  . Follow-up    2 week follow-up for anemia, memory loss  . Medical Management of Chronic Issues    HPI: Patient is a 81 y.o. black female seen in the office today for 2 week f/u on anemia, sudden hearing loss and memory loss.  Is using her walker now and feels much steadier.    Had all her teeth extracted, then scraping of gums.  Then got sinus infection.  Seems these events led to her sudden hearing loss and delirium. Hearing is much better--seems head congestion finally got better.  Asks if she can get her teeth now--yes.    Is taking iron supplement for anemia.  Does make stools black. Constipation no worse.  Softeners don't do a bit of good.  BMs were going well last week.  Has gone a little bit after drinking her milk.  Milk of magnesia helps the bowels move.    Depression:  Says she is doing better now and wants to come off zoloft.  Her sister and I agree that she should continue this.  Review of Systems:  Review of Systems  Constitutional: Positive for malaise/fatigue. Negative for fever and chills.  HENT: Positive for hearing loss. Negative for congestion, ear pain and tinnitus.        Has improved; has only a couple of teeth in place  Eyes:       Wearing glasses today  Respiratory: Negative for shortness of breath.   Cardiovascular: Negative for chest pain.  Gastrointestinal: Negative for abdominal pain.  Genitourinary: Negative for dysuria, urgency and frequency.  Musculoskeletal: Negative for falls.  Neurological: Positive for weakness.  Negative for dizziness and headaches.  Psychiatric/Behavioral: Positive for memory loss.    Past Medical History  Diagnosis Date  . Hypothyroidism   . Cancer   . Hypertension   . Sleep related leg cramps   . Senile osteoporosis   . Malignant neoplasm of thyroid gland   . Nontoxic uninodular goiter   . Dizziness and giddiness   . Other symptoms involving cardiovascular system   . Alzheimer's disease   . Varicose veins of lower extremities with inflammation   . Disorder of bone and cartilage, unspecified   . Hyperlipidemia LDL goal < 100   . Unspecified disorder of lipoid metabolism   . Unspecified hereditary and idiopathic peripheral neuropathy   . Phlebitis and thrombophlebitis of unspecified site   . Other malaise and fatigue   . Stroke     Past Surgical History  Procedure Laterality Date  . Abdominal hysterectomy      DR HUGES  . Knee surgery      right  . Colon surgery    . Thyroid surgery  July 20, 2011  . Parathyroid adenoma removed  1983  . Catract surgery  2008    No Known Allergies Medications: Patient's Medications  New Prescriptions   No medications on file  Previous Medications   ALLOPURINOL (ZYLOPRIM) 300 MG TABLET    Take 1 tablet (300 mg total) by mouth daily.   AMLODIPINE (NORVASC) 10 MG TABLET  Take 1 tablet (10 mg total) by mouth daily.   ASPIRIN EC 81 MG TABLET    Take 1 tablet (81 mg total) by mouth daily.   ATENOLOL (TENORMIN) 25 MG TABLET    Take 1 tablet (25 mg total) by mouth daily.   ATORVASTATIN (LIPITOR) 20 MG TABLET    Take one tablet by mouth once daily at 6pm for cholesterol   CALCIUM CARBONATE (TUMS - DOSED IN MG ELEMENTAL CALCIUM) 500 MG CHEWABLE TABLET    Chew 1 tablet by mouth as needed for indigestion or heartburn.   IRON POLYSACCHARIDES (NU-IRON) 150 MG CAPSULE    Take 1 capsule (150 mg total) by mouth daily.   LEVOTHYROXINE (SYNTHROID, LEVOTHROID) 100 MCG TABLET    TAKE 1 TABLET EVERY DAY ON AN EMPTY STOMACH AND SEPARATED FROM  OTHER MEDICATIONS   LISINOPRIL (PRINIVIL,ZESTRIL) 2.5 MG TABLET    Take one tablet by mouth once daily to control blood pressure   SERTRALINE (ZOLOFT) 50 MG TABLET    Take one tablet by mouth once daily for depression  Modified Medications   No medications on file  Discontinued Medications   AMOXICILLIN-CLAVULANATE (AUGMENTIN) 875-125 MG PER TABLET    Take 1 tablet by mouth 2 (two) times daily.    Physical Exam: Filed Vitals:   07/28/15 1535  BP: 144/80  Pulse: 68  Temp: 98.1 F (36.7 C)  TempSrc: Oral  Resp: 20  Height: 5\' 1"  (1.549 m)  Weight: 158 lb 3.2 oz (71.759 kg)  SpO2: 96%   Physical Exam  Constitutional: No distress.  Walking much more steadily with her walker  HENT:  Head: Normocephalic and atraumatic.  Hearing better, has been [facility] and seems back to baseline  Cardiovascular: Normal rate, regular rhythm, normal heart sounds and intact distal pulses.   Pulmonary/Chest: Effort normal and breath sounds normal.  Musculoskeletal: Normal range of motion.  Neurological: She is alert.  Is responding quickly and appropriately to questions again now  Skin: Skin is warm and dry.  Psychiatric: She has a normal mood and affect.  More pleasant and conversive    Labs reviewed: Basic Metabolic Panel:  Recent Labs  08/27/14 1151 04/05/15 0942 07/11/15 1232  NA 144 143 144  K 4.1 4.1 3.8  CL 104 102 103  CO2 26 25 26   GLUCOSE 87 91 82  BUN 13 14 18   CREATININE 0.99 0.97 0.85  CALCIUM 10.0 9.8 9.5  TSH 1.060 5.980* 1.370   Liver Function Tests:  Recent Labs  04/05/15 0942 07/11/15 1232  AST 19 16  ALT 11 12  ALKPHOS 83 70  BILITOT 0.5 0.4  PROT 6.8 7.0   No results for input(s): LIPASE, AMYLASE in the last 8760 hours. No results for input(s): AMMONIA in the last 8760 hours. CBC:  Recent Labs  08/27/14 1151 04/05/15 0942 07/11/15 1232  WBC 4.4 4.3 5.9  NEUTROABS 2.3 2.6 3.5  HGB 12.3 11.5  --   HCT 37.6 36.0 31.6*  MCV 92 89  --   PLT 210  --    --    Lipid Panel:  Recent Labs  04/05/15 0942 07/11/15 1232  CHOL 178 167  HDL 48 39*  LDLCALC 108* 107*  TRIG 110 105  CHOLHDL 3.7 4.3   Lab Results  Component Value Date   HGBA1C 5.1 03/06/2014    Procedures since last visit:  Reviewed with her and her sister:  07/22/15:  CT head w/o contrast:  No acute intracranial abnormality. Stable  noncontrast CT appearance of the brain since 2015. CT abd/pelvis w/ contrast:  1. No evidence of a primary malignancy or metastatic disease in the abdomen or pelvis. 2. Small 2.6 cm infrarenal saccular abdominal aortic aneurysm. 3. Small to moderate fat containing supraumbilical abdominal hernia. 4. Additional chronic findings as above.  Assessment/Plan 1. Acute cognitive decline -seems this resolved and was due to acute sinus infection, recent tooth extraction and possibly some GI bleeding, as well -CT abd/pelvis was negative for any obvious malignancy and she refuses any invasive testing at her age especially since she now feels better  2. Vascular dementia, without behavioral disturbance -has refused treatment with dementia meds -cont support from her sister, [name]  3. Loss of weight -seems she is improving with use of boost/ensure--some samples provided today of different flavor options -is planning to now get her teeth which should help her gain weight back if they are fitted well  4. Generalized weakness -ongoing but a bit better than the past few weeks -should continue to improve with use of walker and supplements and when she can eat solid food  5. Depression due to stroke -cont zoloft 50mg  -if anything, might increase dose if she takes anymore steps backward--suspect this is also a contributor to her symptoms  6. Acute blood loss anemia -cont iron until next visit then recheck cbc  Labs/tests ordered:No orders of the defined types were placed in this encounter.  will do cbc at least day of her next visit  Next appt: 3 mos  for med mgt  Abdurrahman Petersheim L. Oluwateniola Leitch, D.O. [facility] [address], [place] [address] Cell Phone (Mon-Fri 8am-5pm):  [phone] On Call:  [phone] & follow prompts after 5pm & weekends Office Phone:  [phone] Office Fax:  [phone]

## 2015-10-22 ENCOUNTER — Other Ambulatory Visit: Payer: Self-pay | Admitting: Internal Medicine

## 2015-10-31 ENCOUNTER — Ambulatory Visit (INDEPENDENT_AMBULATORY_CARE_PROVIDER_SITE_OTHER): Payer: Medicare Other | Admitting: Internal Medicine

## 2015-10-31 ENCOUNTER — Encounter: Payer: Self-pay | Admitting: Internal Medicine

## 2015-10-31 ENCOUNTER — Other Ambulatory Visit: Payer: Self-pay

## 2015-10-31 VITALS — BP 150/80 | HR 66 | Temp 98.0°F | Resp 20 | Ht 61.0 in | Wt 156.0 lb

## 2015-10-31 DIAGNOSIS — F0150 Vascular dementia without behavioral disturbance: Secondary | ICD-10-CM | POA: Diagnosis not present

## 2015-10-31 DIAGNOSIS — I10 Essential (primary) hypertension: Secondary | ICD-10-CM

## 2015-10-31 DIAGNOSIS — E785 Hyperlipidemia, unspecified: Secondary | ICD-10-CM

## 2015-10-31 DIAGNOSIS — Z23 Encounter for immunization: Secondary | ICD-10-CM

## 2015-10-31 DIAGNOSIS — IMO0002 Reserved for concepts with insufficient information to code with codable children: Secondary | ICD-10-CM

## 2015-10-31 DIAGNOSIS — F0631 Mood disorder due to known physiological condition with depressive features: Secondary | ICD-10-CM

## 2015-10-31 DIAGNOSIS — R739 Hyperglycemia, unspecified: Secondary | ICD-10-CM

## 2015-10-31 DIAGNOSIS — E890 Postprocedural hypothyroidism: Secondary | ICD-10-CM | POA: Diagnosis not present

## 2015-10-31 DIAGNOSIS — D62 Acute posthemorrhagic anemia: Secondary | ICD-10-CM

## 2015-10-31 DIAGNOSIS — I639 Cerebral infarction, unspecified: Secondary | ICD-10-CM | POA: Diagnosis not present

## 2015-10-31 DIAGNOSIS — I6350 Cerebral infarction due to unspecified occlusion or stenosis of unspecified cerebral artery: Secondary | ICD-10-CM | POA: Diagnosis not present

## 2015-10-31 MED ORDER — ZOSTER VACCINE LIVE 19400 UNT/0.65ML ~~LOC~~ SOLR: 0.6500 mL | Freq: Once | SUBCUTANEOUS | Status: DC

## 2015-10-31 NOTE — Progress Notes (Signed)
Patient ID: Shelia Marks, female   DOB: 11/12/1926, 81 y.o.   MRN: [id number]   Location: [place] Provider: Rexene Edison. Mariea Clonts, D.O., C.M.D.  Code Status: DNR Goals of Care: Advanced Directive information Does patient have an advance directive?: No, Would patient like information on creating an advanced directive?: Yes - Educational materials given  Chief Complaint  Patient presents with  . Medical Management of Chronic Issues    3 month follow-up for Hypertension, Hypothyroidism, Alzhemier Diease3  . Immunizations    Will take flu shot today    HPI: Patient is a 81 y.o. female seen in the office today for med mgt of chronic diseases including prior stroke, mixed dementia, htn, hypothyroidism.    No new problems.  Walking slowly, using cane--leaning against the wall.  No falls.    BP up a little this morning.  Ate pork yesterday.    Anemia:  Needs reassessment.  Had been trending downward in context of illness before.  Taking iron supplement.  Notes she remains constipated despite senokot so using milk of magnesia.  Uses about weekly.  Does not drink enough water to use a fiber supplement and eats a lot of veggies.  Has urinary frequency.   Admits to some urge incontinence.  Wears a pad.    Lost another lb and 1/2.    Eating well.    Mood:  Pt says it's fine.  Says she doesn't get mad at people.  Sometimes sad when lonely.  Says she is getting old.    Memory:  Says doing pretty good.    Gout:  No flare ups.    Asks about shingles vaccine.    Review of Systems:  ROS  Past Medical History  Diagnosis Date  . Hypothyroidism   . Cancer ([place])   . Hypertension   . Sleep related leg cramps   . Senile osteoporosis   . Malignant neoplasm of thyroid gland ([place])   . Nontoxic uninodular goiter   . Dizziness and giddiness   . Other symptoms involving cardiovascular system   . Alzheimer's disease   . Varicose veins of lower extremities with inflammation   .  Disorder of bone and cartilage, unspecified   . Hyperlipidemia LDL goal < 100   . Unspecified disorder of lipoid metabolism   . Unspecified hereditary and idiopathic peripheral neuropathy   . Phlebitis and thrombophlebitis of unspecified site   . Other malaise and fatigue   . Stroke [facility])     Past Surgical History  Procedure Laterality Date  . Abdominal hysterectomy      DR HUGES  . Knee surgery      right  . Colon surgery    . Thyroid surgery  July 20, 2011  . Parathyroid adenoma removed  1983  . Catract surgery  2008    No Known Allergies    Medication List       This list is accurate as of: 10/31/15 10:37 AM.  Always use your most recent med list.               allopurinol 300 MG tablet  Commonly known as:  ZYLOPRIM  Take 1 tablet (300 mg total) by mouth daily.     amLODipine 10 MG tablet  Commonly known as:  NORVASC  Take 1 tablet (10 mg total) by mouth daily.     aspirin EC 81 MG tablet  Take 1 tablet (81 mg total) by mouth daily.     atenolol  25 MG tablet  Commonly known as:  TENORMIN  Take 1 tablet (25 mg total) by mouth daily.     atorvastatin 20 MG tablet  Commonly known as:  LIPITOR  Take one tablet by mouth once daily at 6pm for cholesterol     calcium carbonate 500 MG chewable tablet  Commonly known as:  TUMS - dosed in mg elemental calcium  Chew 1 tablet by mouth as needed for indigestion or heartburn.     iron polysaccharides 150 MG capsule  Commonly known as:  NU-IRON  Take 1 capsule (150 mg total) by mouth daily.     levothyroxine 100 MCG tablet  Commonly known as:  SYNTHROID, LEVOTHROID  TAKE 1 TABLET EVERY DAY ON AN EMPTY STOMACH AND SEPARATED FROM OTHER MEDICATIONS     lisinopril 2.5 MG tablet  Commonly known as:  PRINIVIL,ZESTRIL  Take one tablet by mouth once daily to control blood pressure     sertraline 50 MG tablet  Commonly known as:  ZOLOFT  Take one tablet by mouth once daily for depression        Health Maintenance   Topic Date Due  . PNA vac Low Risk Adult (2 of 2 - PPSV23) 08/28/2015  . ZOSTAVAX  12/01/2015 (Originally 12/07/1988)  . TETANUS/TDAP  12/01/2015 (Originally 12/08/1947)  . INFLUENZA VACCINE  02/29/2016 (Originally 08/01/2015)  . DEXA SCAN  Completed    Physical Exam: Filed Vitals:   10/31/15 1009  BP: 150/80  Pulse: 66  Temp: 98 F (36.7 C)  TempSrc: Oral  Resp: 20  Height: 5\' 1"  (1.549 m)  Weight: 156 lb (70.761 kg)  SpO2: 97%   Body mass index is 29.49 kg/(m^2). Physical Exam  Labs reviewed: Basic Metabolic Panel:  Recent Labs  04/05/15 0942 07/11/15 1232  NA 143 144  K 4.1 3.8  CL 102 103  CO2 25 26  GLUCOSE 91 82  BUN 14 18  CREATININE 0.97 0.85  CALCIUM 9.8 9.5  TSH 5.980* 1.370   Liver Function Tests:  Recent Labs  04/05/15 0942 07/11/15 1232  AST 19 16  ALT 11 12  ALKPHOS 83 70  BILITOT 0.5 0.4  PROT 6.8 7.0  ALBUMIN 4.2 3.8   No results for input(s): LIPASE, AMYLASE in the last 8760 hours. No results for input(s): AMMONIA in the last 8760 hours. CBC:  Recent Labs  04/05/15 0942 07/11/15 1232  WBC 4.3 5.9  NEUTROABS 2.6 3.5  HGB 11.5  --   HCT 36.0 31.6*  MCV 89  --    Lipid Panel:  Recent Labs  04/05/15 0942 07/11/15 1232  CHOL 178 167  HDL 48 39*  LDLCALC 108* 107*  TRIG 110 105  CHOLHDL 3.7 4.3   Lab Results  Component Value Date   HGBA1C 5.1 03/06/2014   Assessment/Plan 1. Vascular dementia, without behavioral disturbance -had a severe episode of delirium with heme positive stool, but this resolved and she's back to herself -cont to monitor mentation and cont nu iron  2. Depression due to stroke [facility]) -doing much better with this also--getting more attention from her sister and alone less which has helped -also continue zoloft  3. Acute blood loss anemia - pt doing much better, hopefully iron has helped as she does not want an invasive workup -reassess: - CBC with Differential/Platelet  4. Hypothyroidism,  postsurgical -last tsh was normal, will reassess now, cont current synthroid - TSH  5. Hyperlipidemia -last lipids not at goal so will need to  reassess after med changes were made - Comprehensive metabolic panel - Lipid panel  6. Essential hypertension, benign - bp well controlled with current therapy and better adherence since her sister has been helping her more - Comprehensive metabolic panel  7. Hyperglycemia -has been stable, is eating better again so reassess - Hemoglobin A1c  8. Need for shingles vaccine -Rx provided again - zoster vaccine live, PF, (ZOSTAVAX) 12878 UNT/0.65ML injection; Inject 19,400 Units into the skin once.  Dispense: 1 each; Refill: 0  9. Need for prophylactic vaccination and inoculation against influenza -flu shot given  Labs/tests ordered:   Orders Placed This Encounter  Procedures  . CBC with Differential/Platelet  . Comprehensive metabolic panel    Order Specific Question:  Has the patient fasted?    Answer:  Yes  . Hemoglobin A1c  . Lipid panel    Order Specific Question:  Has the patient fasted?    Answer:  Yes  . TSH    Next appt: 11/09/2015 for labs, 3 mos for visit   [place]. Jaimen Melone, D.O. [facility] [address], [place] [address] Cell Phone (Mon-Fri 8am-5pm):  [phone] On Call:  [phone] & follow prompts after 5pm & weekends Office Phone:  [phone] Office Fax:  [phone]

## 2015-11-09 ENCOUNTER — Other Ambulatory Visit: Payer: Medicare Other

## 2015-11-09 DIAGNOSIS — I10 Essential (primary) hypertension: Secondary | ICD-10-CM

## 2015-11-09 DIAGNOSIS — E890 Postprocedural hypothyroidism: Secondary | ICD-10-CM | POA: Diagnosis not present

## 2015-11-09 DIAGNOSIS — R739 Hyperglycemia, unspecified: Secondary | ICD-10-CM

## 2015-11-09 DIAGNOSIS — E785 Hyperlipidemia, unspecified: Secondary | ICD-10-CM

## 2015-11-09 DIAGNOSIS — D62 Acute posthemorrhagic anemia: Secondary | ICD-10-CM

## 2015-11-10 LAB — CBC WITH DIFFERENTIAL/PLATELET
Basophils Absolute: 0 10*3/uL (ref 0.0–0.2)
Basos: 1 %
EOS (ABSOLUTE): 0.1 10*3/uL (ref 0.0–0.4)
Eos: 3 %
Hematocrit: 36.4 % (ref 34.0–46.6)
Hemoglobin: 11.2 g/dL (ref 11.1–15.9)
Immature Grans (Abs): 0 10*3/uL (ref 0.0–0.1)
Immature Granulocytes: 0 %
Lymphocytes Absolute: 1.3 10*3/uL (ref 0.7–3.1)
Lymphs: 36 %
MCH: 28.4 pg (ref 26.6–33.0)
MCHC: 30.8 g/dL — ABNORMAL LOW (ref 31.5–35.7)
MCV: 92 fL (ref 79–97)
Monocytes Absolute: 0.2 10*3/uL (ref 0.1–0.9)
Monocytes: 6 %
Neutrophils Absolute: 2 10*3/uL (ref 1.4–7.0)
Neutrophils: 54 %
Platelets: 180 10*3/uL (ref 150–379)
RBC: 3.94 x10E6/uL (ref 3.77–5.28)
RDW: 16.6 % — ABNORMAL HIGH (ref 12.3–15.4)
WBC: 3.6 10*3/uL (ref 3.4–10.8)

## 2015-11-10 LAB — COMPREHENSIVE METABOLIC PANEL
ALT: 13 IU/L (ref 0–32)
AST: 18 IU/L (ref 0–40)
Albumin/Globulin Ratio: 1.5 (ref 1.1–2.5)
Albumin: 4.1 g/dL (ref 3.5–4.7)
Alkaline Phosphatase: 83 IU/L (ref 39–117)
BUN/Creatinine Ratio: 18 (ref 11–26)
BUN: 16 mg/dL (ref 8–27)
Bilirubin Total: 0.5 mg/dL (ref 0.0–1.2)
CO2: 25 mmol/L (ref 18–29)
Calcium: 9.6 mg/dL (ref 8.7–10.3)
Chloride: 103 mmol/L (ref 97–106)
Creatinine, Ser: 0.87 mg/dL (ref 0.57–1.00)
GFR calc Af Amer: 70 mL/min/{1.73_m2} (ref >59)
GFR calc non Af Amer: 61 mL/min/{1.73_m2} (ref >59)
Globulin, Total: 2.7 g/dL (ref 1.5–4.5)
Glucose: 79 mg/dL (ref 65–99)
Potassium: 3.8 mmol/L (ref 3.5–5.2)
Sodium: 142 mmol/L (ref 136–144)
Total Protein: 6.8 g/dL (ref 6.0–8.5)

## 2015-11-10 LAB — TSH: TSH: 2.72 u[IU]/mL (ref 0.450–4.500)

## 2015-11-10 LAB — LIPID PANEL
Chol/HDL Ratio: 2.8 ratio units (ref 0.0–4.4)
Cholesterol, Total: 166 mg/dL (ref 100–199)
HDL: 60 mg/dL (ref >39)
LDL Calculated: 90 mg/dL (ref 0–99)
Triglycerides: 81 mg/dL (ref 0–149)
VLDL Cholesterol Cal: 16 mg/dL (ref 5–40)

## 2015-11-10 LAB — HEMOGLOBIN A1C
Est. average glucose Bld gHb Est-mCnc: 97 mg/dL
Hgb A1c MFr Bld: 5 % (ref 4.8–5.6)

## 2015-11-22 NOTE — Addendum Note (Signed)
Addended by: Leigh Aurora C on: 11/22/2015 05:00 PM   Modules accepted: Orders

## 2016-02-02 ENCOUNTER — Ambulatory Visit (INDEPENDENT_AMBULATORY_CARE_PROVIDER_SITE_OTHER): Payer: Medicare Other | Admitting: Internal Medicine

## 2016-02-02 ENCOUNTER — Encounter: Payer: Self-pay | Admitting: Internal Medicine

## 2016-02-02 VITALS — BP 180/80 | HR 64 | Temp 97.4°F | Resp 20 | Ht 62.0 in | Wt 159.2 lb

## 2016-02-02 DIAGNOSIS — I639 Cerebral infarction, unspecified: Secondary | ICD-10-CM | POA: Diagnosis not present

## 2016-02-02 DIAGNOSIS — F0631 Mood disorder due to known physiological condition with depressive features: Secondary | ICD-10-CM | POA: Diagnosis not present

## 2016-02-02 DIAGNOSIS — K5901 Slow transit constipation: Secondary | ICD-10-CM | POA: Diagnosis not present

## 2016-02-02 DIAGNOSIS — F0150 Vascular dementia without behavioral disturbance: Secondary | ICD-10-CM

## 2016-02-02 DIAGNOSIS — D62 Acute posthemorrhagic anemia: Secondary | ICD-10-CM | POA: Diagnosis not present

## 2016-02-02 DIAGNOSIS — E890 Postprocedural hypothyroidism: Secondary | ICD-10-CM | POA: Diagnosis not present

## 2016-02-02 DIAGNOSIS — IMO0002 Reserved for concepts with insufficient information to code with codable children: Secondary | ICD-10-CM

## 2016-02-02 DIAGNOSIS — I10 Essential (primary) hypertension: Secondary | ICD-10-CM

## 2016-02-02 NOTE — Progress Notes (Signed)
Patient ID: Shelia Marks, female   DOB: May 20, 1926, 82 y.o.   MRN: [id number]   Location: [place] Provider: Rexene Edison. Mariea Clonts, D.O., C.M.D.  Code Status:  Goals of Care: Advanced Directive information    Chief Complaint  Patient presents with  . Medical Management of Chronic Issues    3 mo f/u    HPI: Patient is a 82 y.o. female seen in the office today for med mgt of chronic diseases.  No concerns.    BP sky high.  Reviewed again that she must take her pills with water before coming even if fasting for labs or I can't tell if her meds are working for her blood pressure and she runs the risk of another stroke.  Walking with her walker today.  Uses it sometimes at home and sometimes the cane.  No falls.    Depression:  Spirits have been fine lately.  Gout:  Has not had any flare ups.    Constipation:  Due to ca with D and iron.  Uses prune juice, mom.  Stool softeners don't help.  Has always had constipation.  Still not drinking much water.  Did get lemon juice to put in it.  Does drink a lot of milk.    Review of Systems:  Review of Systems  Constitutional: Negative for fever, chills and malaise/fatigue.  HENT: Positive for hearing loss. Negative for congestion.   Eyes: Negative for blurred vision.  Respiratory: Negative for shortness of breath.   Cardiovascular: Negative for chest pain and leg swelling.  Gastrointestinal: Positive for constipation. Negative for abdominal pain, diarrhea, blood in stool and melena.  Genitourinary: Negative for dysuria.  Musculoskeletal: Negative for falls.       Using walker or cane   Skin: Negative for rash.  Neurological: Positive for weakness. Negative for dizziness and loss of consciousness.  Psychiatric/Behavioral: Positive for memory loss. Negative for depression.    Past Medical History  Diagnosis Date  . Hypothyroidism   . Cancer ([place])   . Hypertension   . Sleep related leg cramps   . Senile osteoporosis   .  Malignant neoplasm of thyroid gland ([place])   . Nontoxic uninodular goiter   . Dizziness and giddiness   . Other symptoms involving cardiovascular system   . Alzheimer's disease   . Varicose veins of lower extremities with inflammation   . Disorder of bone and cartilage, unspecified   . Hyperlipidemia LDL goal < 100   . Unspecified disorder of lipoid metabolism   . Unspecified hereditary and idiopathic peripheral neuropathy   . Phlebitis and thrombophlebitis of unspecified site   . Other malaise and fatigue   . Stroke [facility])     Past Surgical History  Procedure Laterality Date  . Abdominal hysterectomy      DR HUGES  . Knee surgery      right  . Colon surgery    . Thyroid surgery  July 20, 2011  . Parathyroid adenoma removed  1983  . Catract surgery  2008    No Known Allergies    Medication List       This list is accurate as of: 02/02/16 11:06 AM.  Always use your most recent med list.               allopurinol 300 MG tablet  Commonly known as:  ZYLOPRIM  Take 1 tablet (300 mg total) by mouth daily.     amLODipine 10 MG tablet  Commonly known as:  NORVASC  Take 1 tablet (10 mg total) by mouth daily.     aspirin EC 81 MG tablet  Take 1 tablet (81 mg total) by mouth daily.     atenolol 25 MG tablet  Commonly known as:  TENORMIN  Take 1 tablet (25 mg total) by mouth daily.     atorvastatin 20 MG tablet  Commonly known as:  LIPITOR  Take one tablet by mouth once daily at 6pm for cholesterol     calcium carbonate 500 MG chewable tablet  Commonly known as:  TUMS - dosed in mg elemental calcium  Chew 1 tablet by mouth as needed for indigestion or heartburn.     iron polysaccharides 150 MG capsule  Commonly known as:  NU-IRON  Take 1 capsule (150 mg total) by mouth daily.     levothyroxine 100 MCG tablet  Commonly known as:  SYNTHROID, LEVOTHROID  TAKE 1 TABLET EVERY DAY ON AN EMPTY STOMACH AND SEPARATED FROM OTHER MEDICATIONS     lisinopril 2.5 MG tablet    Commonly known as:  PRINIVIL,ZESTRIL  Take one tablet by mouth once daily to control blood pressure     sertraline 50 MG tablet  Commonly known as:  ZOLOFT  Take one tablet by mouth once daily for depression     zoster vaccine live (PF) 19400 UNT/0.65ML injection  Commonly known as:  ZOSTAVAX  Inject 19,400 Units into the skin once.        Health Maintenance  Topic Date Due  . TETANUS/TDAP  12/08/1947  . ZOSTAVAX  12/07/1988  . PNA vac Low Risk Adult (2 of 2 - PPSV23) 08/28/2015  . INFLUENZA VACCINE  07/31/2016  . DEXA SCAN  Completed    Physical Exam: Filed Vitals:   02/02/16 1015  BP: 180/80  Pulse: 64  Temp: 97.4 F (36.3 C)  TempSrc: Oral  Resp: 20  Height: 5\' 2"  (1.575 m)  Weight: 159 lb 3.2 oz (72.213 kg)  SpO2: 97%   Body mass index is 29.11 kg/(m^2). Physical Exam  Constitutional: She appears well-developed and well-nourished. No distress.  Cardiovascular: Normal rate, regular rhythm and normal heart sounds.   Pulmonary/Chest: Effort normal and breath sounds normal. No respiratory distress.  Abdominal: Soft. Bowel sounds are normal. She exhibits no distension. There is no tenderness.  Musculoskeletal: Normal range of motion.  Walks with walker  Neurological: She is alert.  Oriented to person and place,not time, answers questions appropriately, but can be poor historian at times  Skin: Skin is warm and dry.  Psychiatric: She has a normal mood and affect.    Labs reviewed: Basic Metabolic Panel:  Recent Labs  04/05/15 0942 07/11/15 1232 11/09/15 1013  NA 143 144 142  K 4.1 3.8 3.8  CL 102 103 103  CO2 25 26 25   GLUCOSE 91 82 79  BUN 14 18 16   CREATININE 0.97 0.85 0.87  CALCIUM 9.8 9.5 9.6  TSH 5.980* 1.370 2.720   Liver Function Tests:  Recent Labs  04/05/15 0942 07/11/15 1232 11/09/15 1013  AST 19 16 18   ALT 11 12 13   ALKPHOS 83 70 83  BILITOT 0.5 0.4 0.5  PROT 6.8 7.0 6.8  ALBUMIN 4.2 3.8 4.1   No results for input(s):  LIPASE, AMYLASE in the last 8760 hours. No results for input(s): AMMONIA in the last 8760 hours. CBC:  Recent Labs  04/05/15 0942 07/11/15 1232 11/09/15 1013  WBC 4.3 5.9 3.6  NEUTROABS 2.6 3.5 2.0  HGB 11.5  --   --  HCT 36.0 31.6* 36.4  MCV 89 91 92  PLT  --  310 180   Lipid Panel:  Recent Labs  04/05/15 0942 07/11/15 1232 11/09/15 1013  CHOL 178 167 166  HDL 48 39* 60  LDLCALC 108* 107* 90  TRIG 110 105 81  CHOLHDL 3.7 4.3 2.8   Lab Results  Component Value Date   HGBA1C 5.0 11/09/2015    Assessment/Plan 1. Vascular dementia, without behavioral disturbance -stable lately, her sister reports no new problems -gait unsteady and does well using walker when she does use it - Basic metabolic panel  2. Depression due to stroke [facility]) -improved lately, remains on low dose zoloft and tolerating it well  3. Acute blood loss anemia - continues on iron, will see where her h/h is and iron levels - CBC with Differential/Platelet - Ferritin  4. Hypothyroidism, postsurgical -cont on synthroid--is taking properly and no problems - TSH  5. Essential hypertension, benign -bp high w/o meds, so must faithfully take them--cont lisinopril, amlodipine, and atenolol - Basic metabolic panel  6. Constipation, slow transit -due to iron and ca with D, cont high fiber diet, improve water intake, try to be more active, cont prunes and MOM if needed  Labs/tests ordered:   Orders Placed This Encounter  Procedures  . CBC with Differential/Platelet  . Basic metabolic panel  . TSH  . Ferritin   Next appt:  06/01/2016 for annual  [place]. Tacuma Graffam, D.O. [facility] [address], [place] [address] Cell Phone (Mon-Fri 8am-5pm):  [phone] On Call:  [phone] & follow prompts after 5pm & weekends Office Phone:  [phone] Office Fax:  [phone]

## 2016-02-02 NOTE — Patient Instructions (Signed)
Drink 6 8oz glasses of water.

## 2016-02-03 ENCOUNTER — Telehealth: Payer: Self-pay

## 2016-02-03 LAB — CBC WITH DIFFERENTIAL/PLATELET
Basophils Absolute: 0 10*3/uL (ref 0.0–0.2)
Basos: 1 %
EOS (ABSOLUTE): 0.1 10*3/uL (ref 0.0–0.4)
Eos: 3 %
Hematocrit: 37.4 % (ref 34.0–46.6)
Hemoglobin: 12 g/dL (ref 11.1–15.9)
Immature Grans (Abs): 0 10*3/uL (ref 0.0–0.1)
Immature Granulocytes: 0 %
Lymphocytes Absolute: 1.5 10*3/uL (ref 0.7–3.1)
Lymphs: 37 %
MCH: 29 pg (ref 26.6–33.0)
MCHC: 32.1 g/dL (ref 31.5–35.7)
MCV: 90 fL (ref 79–97)
Monocytes Absolute: 0.3 10*3/uL (ref 0.1–0.9)
Monocytes: 6 %
Neutrophils Absolute: 2.2 10*3/uL (ref 1.4–7.0)
Neutrophils: 53 %
Platelets: 189 10*3/uL (ref 150–379)
RBC: 4.14 x10E6/uL (ref 3.77–5.28)
RDW: 16.2 % — ABNORMAL HIGH (ref 12.3–15.4)
WBC: 4 10*3/uL (ref 3.4–10.8)

## 2016-02-03 LAB — BASIC METABOLIC PANEL
BUN/Creatinine Ratio: 19 (ref 11–26)
BUN: 16 mg/dL (ref 8–27)
CO2: 24 mmol/L (ref 18–29)
Calcium: 9.9 mg/dL (ref 8.7–10.3)
Chloride: 103 mmol/L (ref 96–106)
Creatinine, Ser: 0.86 mg/dL (ref 0.57–1.00)
GFR calc Af Amer: 70 mL/min/{1.73_m2} (ref >59)
GFR calc non Af Amer: 61 mL/min/{1.73_m2} (ref >59)
Glucose: 76 mg/dL (ref 65–99)
Potassium: 4 mmol/L (ref 3.5–5.2)
Sodium: 143 mmol/L (ref 134–144)

## 2016-02-03 LAB — FERRITIN: Ferritin: 112 ng/mL (ref 15–150)

## 2016-02-03 LAB — TSH: TSH: 2.75 u[IU]/mL (ref 0.450–4.500)

## 2016-02-03 NOTE — Telephone Encounter (Signed)
-----   Message from Gayland Curry, DO sent at 02/03/2016  9:01 AM EST ----- Labs are doing well.   Anemia has resolved. Let's decrease her iron to every other day Thyroid and kidneys are normal

## 2016-02-03 NOTE — Telephone Encounter (Signed)
Discussed with patient, patient verbalized understanding of results. Medication list updated to reflect change in iron supplement

## 2016-02-13 ENCOUNTER — Other Ambulatory Visit: Payer: Self-pay | Admitting: *Deleted

## 2016-02-13 DIAGNOSIS — C73 Malignant neoplasm of thyroid gland: Secondary | ICD-10-CM | POA: Diagnosis not present

## 2016-02-13 DIAGNOSIS — E890 Postprocedural hypothyroidism: Secondary | ICD-10-CM | POA: Diagnosis not present

## 2016-02-13 MED ORDER — POLYSACCHARIDE IRON COMPLEX 150 MG PO CAPS: 150.0000 mg | ORAL_CAPSULE | ORAL | Status: DC

## 2016-02-13 NOTE — Telephone Encounter (Signed)
Shelia Marks called requested iron medication to be faxed to [facility].

## 2016-02-17 ENCOUNTER — Other Ambulatory Visit: Payer: Self-pay | Admitting: Internal Medicine

## 2016-03-09 ENCOUNTER — Encounter (HOSPITAL_COMMUNITY): Payer: Self-pay | Admitting: *Deleted

## 2016-03-09 ENCOUNTER — Emergency Department (HOSPITAL_COMMUNITY)
Admission: EM | Admit: 2016-03-09 | Discharge: 2016-03-09 | Disposition: A | Discharge status: Home / Self Care | Payer: Medicare Other | Attending: Emergency Medicine | Admitting: Emergency Medicine

## 2016-03-09 ENCOUNTER — Emergency Department (HOSPITAL_COMMUNITY): Payer: Medicare Other

## 2016-03-09 DIAGNOSIS — M810 Age-related osteoporosis without current pathological fracture: Secondary | ICD-10-CM | POA: Diagnosis not present

## 2016-03-09 DIAGNOSIS — G309 Alzheimer's disease, unspecified: Secondary | ICD-10-CM | POA: Insufficient documentation

## 2016-03-09 DIAGNOSIS — E039 Hypothyroidism, unspecified: Secondary | ICD-10-CM | POA: Insufficient documentation

## 2016-03-09 DIAGNOSIS — W19XXXA Unspecified fall, initial encounter: Secondary | ICD-10-CM

## 2016-03-09 DIAGNOSIS — R42 Dizziness and giddiness: Secondary | ICD-10-CM | POA: Diagnosis not present

## 2016-03-09 DIAGNOSIS — I10 Essential (primary) hypertension: Secondary | ICD-10-CM | POA: Insufficient documentation

## 2016-03-09 DIAGNOSIS — Z8673 Personal history of transient ischemic attack (TIA), and cerebral infarction without residual deficits: Secondary | ICD-10-CM | POA: Insufficient documentation

## 2016-03-09 DIAGNOSIS — M25569 Pain in unspecified knee: Secondary | ICD-10-CM | POA: Diagnosis not present

## 2016-03-09 DIAGNOSIS — S8002XA Contusion of left knee, initial encounter: Secondary | ICD-10-CM

## 2016-03-09 DIAGNOSIS — S299XXA Unspecified injury of thorax, initial encounter: Secondary | ICD-10-CM | POA: Diagnosis not present

## 2016-03-09 DIAGNOSIS — M25562 Pain in left knee: Secondary | ICD-10-CM | POA: Diagnosis not present

## 2016-03-09 DIAGNOSIS — T148 Other injury of unspecified body region: Secondary | ICD-10-CM | POA: Diagnosis not present

## 2016-03-09 DIAGNOSIS — Z7982 Long term (current) use of aspirin: Secondary | ICD-10-CM | POA: Diagnosis not present

## 2016-03-09 DIAGNOSIS — Z79899 Other long term (current) drug therapy: Secondary | ICD-10-CM | POA: Insufficient documentation

## 2016-03-09 DIAGNOSIS — E785 Hyperlipidemia, unspecified: Secondary | ICD-10-CM | POA: Diagnosis not present

## 2016-03-09 DIAGNOSIS — Y9289 Other specified places as the place of occurrence of the external cause: Secondary | ICD-10-CM | POA: Insufficient documentation

## 2016-03-09 DIAGNOSIS — Z9889 Other specified postprocedural states: Secondary | ICD-10-CM | POA: Insufficient documentation

## 2016-03-09 DIAGNOSIS — Y9301 Activity, walking, marching and hiking: Secondary | ICD-10-CM | POA: Diagnosis not present

## 2016-03-09 DIAGNOSIS — Y998 Other external cause status: Secondary | ICD-10-CM | POA: Diagnosis not present

## 2016-03-09 DIAGNOSIS — R531 Weakness: Secondary | ICD-10-CM | POA: Diagnosis not present

## 2016-03-09 DIAGNOSIS — Z85850 Personal history of malignant neoplasm of thyroid: Secondary | ICD-10-CM | POA: Insufficient documentation

## 2016-03-09 DIAGNOSIS — W010XXA Fall on same level from slipping, tripping and stumbling without subsequent striking against object, initial encounter: Secondary | ICD-10-CM | POA: Insufficient documentation

## 2016-03-09 DIAGNOSIS — S0990XA Unspecified injury of head, initial encounter: Secondary | ICD-10-CM | POA: Diagnosis not present

## 2016-03-09 DIAGNOSIS — S8992XA Unspecified injury of left lower leg, initial encounter: Secondary | ICD-10-CM | POA: Diagnosis present

## 2016-03-09 LAB — CBC WITH DIFFERENTIAL/PLATELET
Basophils Absolute: 0 10*3/uL (ref 0.0–0.1)
Basophils Relative: 0 %
Eosinophils Absolute: 0.2 10*3/uL (ref 0.0–0.7)
Eosinophils Relative: 3 %
HCT: 37.1 % (ref 36.0–46.0)
Hemoglobin: 11.8 g/dL — ABNORMAL LOW (ref 12.0–15.0)
Lymphocytes Relative: 36 %
Lymphs Abs: 2 10*3/uL (ref 0.7–4.0)
MCH: 28.6 pg (ref 26.0–34.0)
MCHC: 31.8 g/dL (ref 30.0–36.0)
MCV: 90 fL (ref 78.0–100.0)
Monocytes Absolute: 0.3 10*3/uL (ref 0.1–1.0)
Monocytes Relative: 5 %
Neutro Abs: 3.1 10*3/uL (ref 1.7–7.7)
Neutrophils Relative %: 56 %
Platelets: 179 10*3/uL (ref 150–400)
RBC: 4.12 MIL/uL (ref 3.87–5.11)
RDW: 15.3 % (ref 11.5–15.5)
WBC: 5.6 10*3/uL (ref 4.0–10.5)

## 2016-03-09 LAB — URINALYSIS, ROUTINE W REFLEX MICROSCOPIC
Bilirubin Urine: NEGATIVE
Glucose, UA: NEGATIVE mg/dL
Hgb urine dipstick: NEGATIVE
Ketones, ur: NEGATIVE mg/dL
Leukocytes, UA: NEGATIVE
Nitrite: NEGATIVE
Protein, ur: NEGATIVE mg/dL
Specific Gravity, Urine: 1.006 (ref 1.005–1.030)
pH: 7.5 (ref 5.0–8.0)

## 2016-03-09 LAB — BASIC METABOLIC PANEL
Anion gap: 11 (ref 5–15)
BUN: 12 mg/dL (ref 6–20)
CO2: 24 mmol/L (ref 22–32)
Calcium: 9.8 mg/dL (ref 8.9–10.3)
Chloride: 108 mmol/L (ref 101–111)
Creatinine, Ser: 0.88 mg/dL (ref 0.44–1.00)
GFR calc Af Amer: >60 mL/min (ref >60)
GFR calc non Af Amer: 57 mL/min — ABNORMAL LOW (ref >60)
Glucose, Bld: 112 mg/dL — ABNORMAL HIGH (ref 65–99)
Potassium: 3.3 mmol/L — ABNORMAL LOW (ref 3.5–5.1)
Sodium: 143 mmol/L (ref 135–145)

## 2016-03-09 LAB — TROPONIN I: Troponin I: <0.03 ng/mL (ref <0.031)

## 2016-03-09 NOTE — Discharge Instructions (Signed)
Contusion There is no evidence of broken bone. There is no infection or new stroke. Follow up with your doctor. Return to the ED if you develop new or worsening symptoms. A contusion is a deep bruise. Contusions are the result of a blunt injury to tissues and muscle fibers under the skin. The injury causes bleeding under the skin. The skin overlying the contusion may turn blue, purple, or yellow. Minor injuries will give you a painless contusion, but more severe contusions may stay painful and swollen for a few weeks.  CAUSES  This condition is usually caused by a blow, trauma, or direct force to an area of the body. SYMPTOMS  Symptoms of this condition include:  Swelling of the injured area.  Pain and tenderness in the injured area.  Discoloration. The area may have redness and then turn blue, purple, or yellow. DIAGNOSIS  This condition is diagnosed based on a physical exam and medical history. An X-ray, CT scan, or MRI may be needed to determine if there are any associated injuries, such as broken bones (fractures). TREATMENT  Specific treatment for this condition depends on what area of the body was injured. In general, the best treatment for a contusion is resting, icing, applying pressure to (compression), and elevating the injured area. This is often called the RICE strategy. Over-the-counter anti-inflammatory medicines may also be recommended for pain control.  HOME CARE INSTRUCTIONS   Rest the injured area.  If directed, apply ice to the injured area:  Put ice in a plastic bag.  Place a towel between your skin and the bag.  Leave the ice on for 20 minutes, 2-3 times per day.  If directed, apply light compression to the injured area using an elastic bandage. Make sure the bandage is not wrapped too tightly. Remove and reapply the bandage as directed by your health care provider.  If possible, raise (elevate) the injured area above the level of your heart while you are sitting  or lying down.  Take over-the-counter and prescription medicines only as told by your health care provider. SEEK MEDICAL CARE IF:  Your symptoms do not improve after several days of treatment.  Your symptoms get worse.  You have difficulty moving the injured area. SEEK IMMEDIATE MEDICAL CARE IF:   You have severe pain.  You have numbness in a hand or foot.  Your hand or foot turns pale or cold.   This information is not intended to replace advice given to you by your health care provider. Make sure you discuss any questions you have with your health care provider.   Document Released: 09/26/2005 Document Revised: 09/07/2015 Document Reviewed: 05/04/2015 Elsevier Interactive Patient Education [employer].

## 2016-03-09 NOTE — ED Notes (Signed)
thye pt arrived by ems from home.  She fell going to the br.  She is supposed to use a walker but was walking alone.  She is c/o lt knee pain.  She was dizzy when she woke up this am but none now alert oriented skin warm and dry

## 2016-03-09 NOTE — ED Notes (Signed)
Pt to mri 

## 2016-03-09 NOTE — ED Notes (Signed)
Going to ct

## 2016-03-09 NOTE — ED Notes (Signed)
Pt returned from mri approx 30 minutes ago alert no distress

## 2016-03-09 NOTE — ED Provider Notes (Signed)
CSN: [id number]     Arrival date & time 03/09/16  1738 History   First MD Initiated Contact with Patient 03/09/16 1757     Chief Complaint  Patient presents with  . Fall     (Consider location/radiation/quality/duration/timing/severity/associated sxs/prior Treatment) HPI Comments: Patient from home after fall with left knee pain. She states she was walking to the bathroom and not using her walker when she fell onto her left knee. Denies hitting her head or losing consciousness. Fall was unwitnessed. States she had some dizziness this morning when she woke up but none now. She describes the dizziness as a lightheaded and off balance sensation which is since improved. Denies any recent nausea vomiting or diarrhea. Denies any chest pain or shortness of breath. Denies any focal weakness, numbness or tingling. Planes of pain in the left knee only. No head, neck, back pain. No chest pain or shortness of breath. History of pontine infarct two years ago with minimal residual deficits. She is on aspirin without any other anticoagulation.  Patient is a 82 y.o. female presenting with fall. The history is provided by the patient and the EMS personnel.  Fall Pertinent negatives include no chest pain, no abdominal pain and no shortness of breath.    Past Medical History  Diagnosis Date  . Hypothyroidism   . Cancer ([place])   . Hypertension   . Sleep related leg cramps   . Senile osteoporosis   . Malignant neoplasm of thyroid gland ([place])   . Nontoxic uninodular goiter   . Dizziness and giddiness   . Other symptoms involving cardiovascular system   . Alzheimer's disease   . Varicose veins of lower extremities with inflammation   . Disorder of bone and cartilage, unspecified   . Hyperlipidemia LDL goal < 100   . Unspecified disorder of lipoid metabolism   . Unspecified hereditary and idiopathic peripheral neuropathy   . Phlebitis and thrombophlebitis of unspecified site   . Other malaise and fatigue    . Stroke [facility])    Past Surgical History  Procedure Laterality Date  . Abdominal hysterectomy      DR HUGES  . Knee surgery      right  . Colon surgery    . Thyroid surgery  July 20, 2011  . Parathyroid adenoma removed  1983  . Catract surgery  2008   Family History  Problem Relation Age of Onset  . Diabetes Brother    Social History  Substance Use Topics  . Smoking status: Never Smoker   . Smokeless tobacco: Never Used  . Alcohol Use: No   OB History    No data available     Review of Systems  Constitutional: Negative for fever, activity change and appetite change.  HENT: Negative for congestion and postnasal drip.   Respiratory: Negative for cough, chest tightness and shortness of breath.   Cardiovascular: Negative for chest pain and leg swelling.  Gastrointestinal: Negative for nausea, vomiting and abdominal pain.  Genitourinary: Negative for dysuria, hematuria, vaginal bleeding and vaginal discharge.  Musculoskeletal: Positive for myalgias and arthralgias. Negative for back pain and neck pain.  Skin: Negative for rash.  Neurological: Positive for weakness and light-headedness.  A complete 10 system review of systems was obtained and all systems are negative except as noted in the HPI and PMH.      Allergies  Review of patient's allergies indicates no known allergies.  Home Medications   Prior to Admission medications   Medication Sig Start  Date End Date Taking? Authorizing Provider  allopurinol (ZYLOPRIM) 300 MG tablet TAKE 1 TABLET EVERY DAY 02/20/16  Yes Tiffany L Reed, DO  amLODipine (NORVASC) 10 MG tablet TAKE 1 TABLET EVERY DAY 02/20/16  Yes Tiffany L Reed, DO  aspirin EC 81 MG tablet Take 1 tablet (81 mg total) by mouth daily. 07/14/15  Yes Tiffany L Reed, DO  atenolol (TENORMIN) 25 MG tablet TAKE 1 TABLET EVERY DAY 02/20/16  Yes Tiffany L Reed, DO  atorvastatin (LIPITOR) 20 MG tablet TAKE 1 TABLET ONE TIME DAILY AT 6PM FOR CHOLESTEROL 02/20/16  Yes Tiffany  L Reed, DO  calcium carbonate (TUMS - DOSED IN MG ELEMENTAL CALCIUM) 500 MG chewable tablet Chew 1 tablet by mouth as needed for indigestion or heartburn.   Yes Historical Provider, MD  iron polysaccharides (NIFEREX) 150 MG capsule Take 1 capsule (150 mg total) by mouth every other day. 02/13/16  Yes Tiffany L Reed, DO  levothyroxine (SYNTHROID, LEVOTHROID) 100 MCG tablet TAKE 1 TABLET EVERY DAY ON AN EMPTY STOMACH AND SEPARATED FROM OTHER MEDICATIONS 10/24/15  Yes Tiffany L Reed, DO  lisinopril (PRINIVIL,ZESTRIL) 2.5 MG tablet TAKE ONE TABLET ONCE DAILY TO CONTROL BLOOD PRESSURE 02/20/16  Yes Tiffany L Reed, DO  sertraline (ZOLOFT) 50 MG tablet TAKE 1 TABLET EVERY DAY  FOR  DEPRESSION 02/20/16  Yes Tiffany L Reed, DO  zoster vaccine live, PF, (ZOSTAVAX) [address] UNT/0.65ML injection Inject 19,400 Units into the skin once. Patient not taking: Reported on 02/02/2016 10/31/15   Tiffany L Reed, DO   BP 151/76 mmHg  Pulse 62  Temp(Src) 98.4 F (36.9 C)  Resp 18  Wt 156 lb (70.761 kg)  SpO2 100% Physical Exam  Constitutional: She is oriented to person, place, and time. She appears well-developed and well-nourished. No distress.  HENT:  Head: Normocephalic and atraumatic.  Mouth/Throat: Oropharynx is clear and moist. No oropharyngeal exudate.  Eyes: Conjunctivae and EOM are normal. Pupils are equal, round, and reactive to light.  Neck: Normal range of motion. Neck supple.  No C spine tenderness  Cardiovascular: Normal rate, regular rhythm, normal heart sounds and intact distal pulses.   No murmur heard. Pulmonary/Chest: Effort normal and breath sounds normal. No respiratory distress. She exhibits no tenderness.  Abdominal: Soft. There is no tenderness. There is no rebound and no guarding.  Musculoskeletal: Normal range of motion. She exhibits tenderness. She exhibits no edema.  Patella surgically absent on L. Tenderness and hematoma to left anterior tibia below knee joint. Range of motion intact.  Intact DP and PT pulses. Full range of motion of hips bilaterally  Neurological: She is alert and oriented to person, place, and time. No cranial nerve deficit. She exhibits normal muscle tone. Coordination normal.  Cranial nerves II-12 intact. No appreciable nystagmus. No ataxia in finger to nose. 4/5 strength throughout. No pronator drift. Subjective sensation intact  Skin: Skin is warm.  Psychiatric: She has a normal mood and affect. Her behavior is normal.  Nursing note and vitals reviewed.   ED Course  Procedures (including critical care time) Labs Review Labs Reviewed  CBC WITH DIFFERENTIAL/PLATELET - Abnormal; Notable for the following:    Hemoglobin 11.8 (*)    All other components within normal limits  BASIC METABOLIC PANEL - Abnormal; Notable for the following:    Potassium 3.3 (*)    Glucose, Bld 112 (*)    GFR calc non Af Amer 57 (*)    All other components within normal limits  URINALYSIS, ROUTINE W  REFLEX MICROSCOPIC (NOT AT [facility]) - Abnormal; Notable for the following:    Color, Urine STRAW (*)    All other components within normal limits  TROPONIN I    Imaging Review Dg Chest 2 View  03/09/2016  CLINICAL DATA:  Fall EXAM: CHEST  2 VIEW COMPARISON:  07/16/2011 FINDINGS: Lungs are clear.  No pleural effusion or pneumothorax. The heart is within the upper limits of normal for size. Degenerative changes of the visualized thoracolumbar spine. Degenerative changes of the bilateral shoulders. IMPRESSION: No evidence of acute cardiopulmonary disease. Electronically Signed   By: Julian Hy M.D.   On: 03/09/2016 19:20   Ct Head Wo Contrast  03/09/2016  CLINICAL DATA:  Fall. EXAM: CT HEAD WITHOUT CONTRAST TECHNIQUE: Contiguous axial images were obtained from the base of the skull through the vertex without intravenous contrast. COMPARISON:  CT head 07/22/2015 FINDINGS: Generalized atrophy. Mild ventricular enlargement similar to the prior study. Patchy hypodensity in the  cerebral white matter bilaterally unchanged and consistent with chronic microvascular ischemia. Negative for acute infarct.  Negative for hemorrhage or mass. Calvarium intact.  Mucosal edema right and left sphenoid sinus. IMPRESSION: Atrophy and chronic ischemia.  No acute abnormality. Electronically Signed   By: Franchot Gallo M.D.   On: 03/09/2016 19:35   Mr Brain Wo Contrast  03/09/2016  CLINICAL DATA:  Initial evaluation for acute dizziness, fall, vertigo. EXAM: MRI HEAD WITHOUT CONTRAST TECHNIQUE: Multiplanar, multiecho pulse sequences of the brain and surrounding structures were obtained without intravenous contrast. COMPARISON:  Prior CT from earlier the same day. FINDINGS: Diffuse prominence of the CSF containing spaces is compatible with generalized age-related cerebral atrophy. Patchy and confluent T2/FLAIR hyperintensity within the periventricular and deep white matter both cerebral hemispheres most compatible with chronic small vessel ischemic disease, fairly mild for patient age. Small remote lacunar infarcts within the pons. No abnormal foci of restricted diffusion to suggest acute intracranial infarct. Major intracranial vascular flow voids are maintained. No acute or chronic intracranial hemorrhage. No mass lesion, midline shift, or mass effect. Mild ventricular prominence related to global parenchymal volume loss of hydrocephalus. No extra-axial fluid collection. Craniocervical junction within normal limits. Multilevel degenerative spondylolysis noted within the visualized upper cervical spine without significant stenosis. Pituitary gland normal. No acute abnormality about the orbits. Sequela prior bilateral lens extraction noted. Mild scattered mucosal thickening within the paranasal sinuses. Small mild layering opacity within the lateral recess of the right sphenoid sinus. No mastoid effusion. Inner ear structures grossly normal. Bone marrow signal intensity within normal limits. Mild  hyperostosis frontalis interna noted. Scalp soft tissues unremarkable. IMPRESSION: 1. No acute intracranial process identified. 2. Generalized age-related cerebral atrophy with mild chronic small vessel ischemic disease. Small remote lacunar pontine infarcts. Electronically Signed   By: Jeannine Boga M.D.   On: 03/09/2016 21:22   Dg Knee Complete 4 Views Left  03/09/2016  CLINICAL DATA:  Acute left knee pain following fall. Initial encounter. History of patellar resection. EXAM: LEFT KNEE - COMPLETE 4+ VIEW COMPARISON:  None. FINDINGS: There is no evidence of acute fracture or dislocation. No joint effusion is noted. Degenerative changes are present, severe in the medial compartment. There has been resection of the patella. IMPRESSION: No acute abnormality. Degenerative changes, severe in the medial compartment. Electronically Signed   By: Margarette Canada M.D.   On: 03/09/2016 19:20   I have personally reviewed and evaluated these images and lab results as part of my medical decision-making.   EKG Interpretation  Date/Time:  Friday March 09 2016 18:03:57 EST Ventricular Rate:  68 PR Interval:  140 QRS Duration: 150 QT Interval:  448 QTC Calculation: 476 R Axis:   -72 Text Interpretation:  Normal sinus rhythm Possible Left atrial enlargement  Left axis deviation Right bundle branch block Anteroseptal infarct , age  undetermined T wave abnormality, consider inferolateral ischemia Abnormal  ECG new RBBB ST depression laterally similar to 03/05/14 Reconfirmed by  Wyvonnia Dusky  MD, [place] [phone]) on 03/09/2016 9:18:57 PM      MDM   Final diagnoses:  Fall, initial encounter  Knee contusion, left, initial encounter    Patient complains of left knee pain after a fall at home. Denies any loss of consciousness or syncope. Is not sure why she fell. Had some dizziness earlier today that has since resolved. Admits she was not using her walker.  Nonfocal neurological exam.  X-rays negative for  acute fracture or dislocation. EKG is unchanged from previous. No chest pain or shortness of breath. CT head is stable.  Patient is able to ambulate. Imaging and labs are reassuring. UA negative.  MRI without new stroke.  Patient is tolerating PO and ambulatory. Follow up with PCP. Return precautions discussed.  Ezequiel Essex, MD 03/10/16 [phone]

## 2016-03-26 ENCOUNTER — Telehealth: Payer: Self-pay

## 2016-03-26 NOTE — Telephone Encounter (Signed)
Patient called want OTC medication for vertigo. Told her there wasn't one. She went to the ED 03/09/16 had fallen, told them she was dizzy, they didn't give her anything for it. Told her she needed to come in before Dr. Mariea Clonts would give Rx. She has appt 06/01/16 will wait, doesn't want to come in any sooner. If it gets any worse she will call back.

## 2016-04-10 ENCOUNTER — Other Ambulatory Visit: Payer: Self-pay | Admitting: *Deleted

## 2016-04-10 ENCOUNTER — Telehealth: Payer: Self-pay | Admitting: *Deleted

## 2016-04-10 DIAGNOSIS — H8110 Benign paroxysmal vertigo, unspecified ear: Secondary | ICD-10-CM

## 2016-04-10 NOTE — Telephone Encounter (Signed)
I recommend physical therapy.  Her sister, Shelia Marks, is usually the one we communicate with b/c Shelia Marks has memory loss.  Would they like for her to do home health PT or outpatient PT?

## 2016-04-10 NOTE — Telephone Encounter (Signed)
Referral sent for Physical Therapy per Dr. Mariea Clonts.

## 2016-04-10 NOTE — Telephone Encounter (Signed)
Patient called regarding her vertigo, she stated that she has used some over the counter medication. But it has not helped much, wants to know how long does this last, and is there anything that she can use that will make it better.Please advise!

## 2016-04-17 ENCOUNTER — Ambulatory Visit: Payer: Medicare Other | Attending: Internal Medicine | Admitting: Rehabilitative and Restorative Service Providers"

## 2016-04-17 VITALS — BP 179/83

## 2016-04-17 DIAGNOSIS — R2689 Other abnormalities of gait and mobility: Secondary | ICD-10-CM

## 2016-04-17 DIAGNOSIS — H8111 Benign paroxysmal vertigo, right ear: Secondary | ICD-10-CM | POA: Diagnosis not present

## 2016-04-17 NOTE — Therapy (Signed)
[facility] [address] [place], [place], [address] Phone: [phone]   Fax:  [phone]  Physical Therapy Evaluation  Patient Details  Name: Shelia Marks MRN: [id number] Date of Birth: 02-03-1926 Referring Provider: Hollace Kinnier, MD  Encounter Date: 04/17/2016      PT End of Session - 04/17/16 1351    Visit Number 1   Number of Visits 6   Date for PT Re-Evaluation 06/01/16   Authorization Type G code every 10th visit   PT Start Time 1150   PT Stop Time 1230   PT Time Calculation (min) 40 min   Activity Tolerance Patient tolerated treatment well   Behavior During Therapy [facility] for tasks assessed/performed      Past Medical History  Diagnosis Date  . Hypothyroidism   . Cancer ([place])   . Hypertension   . Sleep related leg cramps   . Senile osteoporosis   . Malignant neoplasm of thyroid gland ([place])   . Nontoxic uninodular goiter   . Dizziness and giddiness   . Other symptoms involving cardiovascular system   . Alzheimer's disease   . Varicose veins of lower extremities with inflammation   . Disorder of bone and cartilage, unspecified   . Hyperlipidemia LDL goal < 100   . Unspecified disorder of lipoid metabolism   . Unspecified hereditary and idiopathic peripheral neuropathy   . Phlebitis and thrombophlebitis of unspecified site   . Other malaise and fatigue   . Stroke [facility])     Past Surgical History  Procedure Laterality Date  . Abdominal hysterectomy      DR HUGES  . Knee surgery      right  . Colon surgery    . Thyroid surgery  July 20, 2011  . Parathyroid adenoma removed  1983  . Catract surgery  2008    Filed Vitals:   04/17/16 1214  BP: 179/83         Subjective Assessment - 04/17/16 1149    Subjective The patient reports that she had a recent fall and went to the ED.  She notes dizziness that began a few weeks ago and is improving slowly.  "It's almost gone".  She reports some  occasional dizziness when transitioning from sitting to lying down or getting up quickly.   Patient is accompained by: Family member  Sister   Pertinent History Alzheimer's disease, h/o CVA, falls.   Patient Stated Goals "What am I here for".  She reports she is not dizzy when she falls.  Get rid of swimmingin the head "it is leaving."   Currently in Pain? No/denies            [facility] PT Assessment - 04/17/16 1156    Assessment   Medical Diagnosis Vertigo   Referring Provider Hollace Kinnier, MD   Onset Date/Surgical Date --  02/2016   Prior Therapy none   Precautions   Precautions Fall   Balance Screen   Has the patient fallen in the past 6 months Yes   How many times? 3   Has the patient had a decrease in activity level because of a fear of falling?  Yes   Is the patient reluctant to leave their home because of a fear of falling?  No  "I'm doing fine now"   [place] residence   Observation/Other Assessments   Other Surveys  --  DHI=58%; filled out by sister   Ambulation/Gait   Ambulation/Gait  Yes   Ambulation/Gait Assistance 4: Min guard   Ambulation Distance (Feet) 100 Feet   Assistive device Straight cane   Gait Pattern Decreased stride length  dec'd L weight shift, relies on furniture of hand held supp   Ambulation Surface Level   Gait velocity 0.83 ft/sec            Vestibular Assessment - 04/17/16 1158    Vestibular Assessment   General Observation The patient walks into clinic with [facility] requiring HHA from therapist.     Symptom Behavior   Type of Dizziness --  "moving a little bit and if you lay there awhile it leaves"   Frequency of Dizziness --  daily   Duration of Dizziness seconds   Aggravating Factors --  movement   Relieving Factors Head stationary   Occulomotor Exam   Occulomotor Alignment Normal   Spontaneous Absent   Gaze-induced Absent   Smooth Pursuits Intact  when she is able to keep attending to task,  needs verbal cue   Saccades Intact;Slow   Vestibulo-Occular Reflex   VOR 1 Head Only (x 1 viewing) Gaze x 1 performed with head impulse test is positive to both sides with right side having a greater refixation saccade.   Positional Testing   Sidelying Test Sidelying Right;Sidelying Left   Sidelying Right   Sidelying Right Duration seconds   Sidelying Right Symptoms --  unsure of direction due to pt closes eyes          Vestibular Treatment/Exercise - 04/17/16 1215    Vestibular Treatment/Exercise   Vestibular Treatment Provided Canalith Repositioning   Canalith Repositioning Epley Manuever Right    EPLEY MANUEVER RIGHT   Number of Reps  1   Overall Response Symptoms Resolved   Response Details  Retested with R sidelying test and no vertigo or dizziness noted.                PT Education - 04/17/16 1350    Education provided Yes   Education Details nature of BPPV, treatment, handout    Person(s) Educated Patient;Caregiver(s)   Methods Explanation;Handout   Comprehension Verbalized understanding          PT Short Term Goals - 04/17/16 1354    PT SHORT TERM GOAL #1   Title The patient will return demo HEP with sister's assistance for habituation and balance activities as indicated.   Baseline Target date 05/17/2016   Time 4   Period Weeks   PT SHORT TERM GOAL #2   Title The patient will have negative right sidelying test indicating resolution of BPPV.   Baseline Target date 05/17/2016   Time 4   Period Weeks           PT Long Term Goals - 04/17/16 1357    PT LONG TERM GOAL #1   Title Thepatient will reduce Dizziness handicap inventory from 58% to < or equal to 45% to demo improved subjective perception of dizziness.   Baseline Target date: 06/01/2016   Time 6   Period Weeks   PT LONG TERM GOAL #2   Title The patient will improve gait speed from 0.83 ft/sec to > or equal to 1.3 ft/sec to indicate improved "limited community" ambulation.   Baseline Target  date 06/01/2016   Time 8   Period Weeks               Plan - 04/17/16 1358    Clinical Impression Statement The patient is an 82 yo  female presenting to OP PT with positive R sidelying test indicating R BPPV.  Patient tolerated treatment (with neck supported due to neck discomfort) and had negative sidelying test s/p Epley's maneuver.  PT to reassess and help provide home activities for balance and mobility, as indicated.  [name] was present today due to patient's cognitive status.   Rehab Potential Good   PT Frequency 1x / week   PT Duration 6 weeks   PT Treatment/Interventions ADLs/Self Care Home Management;Canalith Repostioning;Vestibular;Therapeutic exercise;Therapeutic activities;Functional mobility training;Patient/family education;Gait training;Neuromuscular re-education   PT Next Visit Plan Check R BPPV with sidelying test.  Perform habituation as needed and CRT.  Provide further balance assessment/treatment as indicated.   Consulted and Agree with Plan of Care Patient;Family member/caregiver   Family Member Consulted sister, [name]      Patient will benefit from skilled therapeutic intervention in order to improve the following deficits and impairments:  Abnormal gait, Decreased balance, Dizziness  Visit Diagnosis: BPPV (benign paroxysmal positional vertigo), right  Other abnormalities of gait and mobility      G-Codes - April 22, 2016 1401    Functional Assessment Tool Used DHI=58%   Functional Limitation Self care   Self Care Current Status [id number]) At least 40 percent but less than 60 percent impaired, limited or restricted   Self Care Goal Status [id number]) At least 20 percent but less than 40 percent impaired, limited or restricted       Problem List Patient Active Problem List   Diagnosis Date Noted  . Ataxia, late effect of cerebrovascular disease 04/23/2014  . Right pontine stroke ([place]) 04/18/2014  . Hypothyroidism, postsurgical 04/18/2014  .  Dysphagia as late effect of stroke 04/18/2014  . Essential hypertension, benign 04/18/2014  . Constipation, slow transit 04/18/2014  . 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 Hypothyroidism   . Hypertension   . Senile osteoporosis   . Hyperlipidemia LDL goal < 100   . Alzheimer's disease     Weldon Nouri, PT 04-22-2016, 2:01 PM  [facility] [address] [place], [place], [address] Phone: [phone]   Fax:  [phone]  Name: Treanna Wolgemuth MRN: [id number] Date of Birth: 02-29-1926

## 2016-04-19 ENCOUNTER — Other Ambulatory Visit: Payer: Self-pay | Admitting: Internal Medicine

## 2016-04-20 ENCOUNTER — Ambulatory Visit: Payer: Medicare Other | Admitting: Rehabilitative and Restorative Service Providers"

## 2016-04-20 DIAGNOSIS — R2689 Other abnormalities of gait and mobility: Secondary | ICD-10-CM | POA: Diagnosis not present

## 2016-04-20 DIAGNOSIS — H8111 Benign paroxysmal vertigo, right ear: Secondary | ICD-10-CM | POA: Diagnosis not present

## 2016-04-20 NOTE — Therapy (Signed)
[facility] [address] [place], [place], [address] Phone: [phone]   Fax:  [phone]  Physical Therapy Treatment  Patient Details  Name: Shelia Marks MRN: [id number] Date of Birth: [birth year] Referring Provider: Hollace Kinnier, MD  Encounter Date: 04/20/2016      PT End of Session - 04/20/16 1357    Visit Number 2   Number of Visits 6   Date for PT Re-Evaluation 06/01/16   Authorization Type G code every 10th visit   PT Start Time 1318   PT Stop Time 1358   PT Time Calculation (min) 40 min   Activity Tolerance Patient tolerated treatment well   Behavior During Therapy [facility] for tasks assessed/performed      Past Medical History  Diagnosis Date  . Hypothyroidism   . Cancer ([place])   . Hypertension   . Sleep related leg cramps   . Senile osteoporosis   . Malignant neoplasm of thyroid gland ([place])   . Nontoxic uninodular goiter   . Dizziness and giddiness   . Other symptoms involving cardiovascular system   . Alzheimer's disease   . Varicose veins of lower extremities with inflammation   . Disorder of bone and cartilage, unspecified   . Hyperlipidemia LDL goal < 100   . Unspecified disorder of lipoid metabolism   . Unspecified hereditary and idiopathic peripheral neuropathy   . Phlebitis and thrombophlebitis of unspecified site   . Other malaise and fatigue   . Stroke [facility])     Past Surgical History  Procedure Laterality Date  . Abdominal hysterectomy      DR HUGES  . Knee surgery      right  . Colon surgery    . Thyroid surgery  July 20, 2011  . Parathyroid adenoma removed  1983  . Catract surgery  2008    There were no vitals filed for this visit.      Subjective Assessment - 04/20/16 1323    Subjective The patient reports her vertigo improved and she has not noticed it since last visit.     Patient is accompained by: Family member  sister   Patient Stated Goals "What am I here for".   She reports she is not dizzy when she falls.  Get rid of swimmingin the head "it is leaving."   Currently in Pain? No/denies            [facility] PT Assessment - 04/20/16 1345    Standardized Balance Assessment   Standardized Balance Assessment Berg Balance Test   Berg Balance Test   Sit to Stand Able to stand  independently using hands   Standing Unsupported Able to stand safely 2 minutes   Sitting with Back Unsupported but Feet Supported on Floor or Stool Able to sit safely and securely 2 minutes   Stand to Sit Controls descent by using hands   Transfers Able to transfer safely, definite need of hands   Standing Unsupported with Eyes Closed Able to stand 10 seconds with supervision   Standing Ubsupported with Feet Together Able to place feet together independently and stand for 1 minute with supervision   From Standing, Reach Forward with Outstretched Arm Reaches forward but needs supervision   From Standing Position, Pick up Object from Floor Able to pick up shoe, needs supervision   From Standing Position, Turn to Look Behind Over each Shoulder Turn sideways only but maintains balance   Turn 360 Degrees Needs assistance while turning  Standing Unsupported, Alternately Place Feet on Step/Stool Needs assistance to keep from falling or unable to try   Standing Unsupported, One Foot in [place] to take small step independently and hold 30 seconds   Standing on One Leg Unable to try or needs assist to prevent fall   Total Score 31   Berg comment: 31/56 indicating high fall risk.            Vestibular Assessment - 04/20/16 1633    Positional Testing   Sidelying Test Sidelying Right;Sidelying Left   Sidelying Right   Sidelying Right Duration none   Sidelying Right Symptoms No nystagmus   Sidelying Left   Sidelying Left Duration none   Sidelying Left Symptoms No nystagmus                 [facility] Adult PT Treatment/Exercise - 04/20/16 1345    Ambulation/Gait    Ambulation/Gait Yes   Ambulation/Gait Assistance 4: Min guard  hand held assistance   Ambulation Distance (Feet) 230 Feet  x 3 reps   Assistive device Straight cane   Gait Pattern Decreased stride length   Ambulation Surface Level   Gait Comments Gait activities emphasizing upright posture, longer stride and decreasing reliance on hand held support.   Self-Care   Self-Care Other Self-Care Comments   Other Self-Care Comments  Patient inquired about walking up/down hill at her house.  PT recommended she walk with family assistance and have them call [employer] [facility] local [employer] to find safe, level, indoor surface for gait activities.               Balance Exercises - 04/20/16 1327    [place] PROGRAM   Head Movements Standing;5 reps   Ankle Movements Sitting;10 reps   Knee Extensor 10 reps   Ankle Plantorflexors --  10 reps   Ankle Dorsiflexors --  10reps   Sideways Walking Assistive device  countertop   Sit to Stand 5 reps, one support   Overall [place] Comments Provided written instructions for HEP.  Recommended patient's sister provide supervision and CGA with in the home.             PT Short Term Goals - 04/20/16 1338    PT SHORT TERM GOAL #1   Title The patient will return demo HEP with sister's assistance for habituation and balance activities as indicated.   Baseline Met on 04/20/2016   Time 4   Period Weeks   Status Achieved   PT SHORT TERM GOAL #2   Title The patient will have negative right sidelying test indicating resolution of BPPV.   Baseline Met on 04/20/2016   Time 4   Period Weeks   Status Achieved           PT Long Term Goals - 04/20/16 1635    PT LONG TERM GOAL #1   Title Thepatient will reduce Dizziness handicap inventory from 58% to < or equal to 45% to demo improved subjective perception of dizziness.   Baseline Target date: 06/01/2016   Time 6   Period Weeks   PT LONG TERM GOAL #2   Title The patient will improve gait speed from 0.83  ft/sec to > or equal to 1.3 ft/sec to indicate improved "limited community" ambulation.   Baseline Target date 06/01/2016   Time 8   Period Weeks               Plan - 04/20/16 1344    Clinical Impression Statement  The patient's vertigo appears cleared today.  PT educated patient and her sister in [place] for balance/mobility to reduce fall risk.  Patient's sister reports she will be able to assist with prescribed exercises.  PT recommended further f/u for gait/balance, however patient wants to return only if vertigo returns.  PT provided contact information and will keep chart open for 2-3 weeks to ensure no further visits needed.   PT Treatment/Interventions ADLs/Self Care Home Management;Canalith Repostioning;Vestibular;Therapeutic exercise;Therapeutic activities;Functional mobility training;Patient/family education;Gait training;Neuromuscular re-education   PT Next Visit Plan f/u if patient initiates appt by phone.  Check [place] HEP, gait/balance.   Consulted and Agree with Plan of Care Patient;Family member/caregiver   Family Member Consulted sister, [name]      Patient will benefit from skilled therapeutic intervention in order to improve the following deficits and impairments:  Abnormal gait, Decreased balance, Dizziness  Visit Diagnosis: Other abnormalities of gait and mobility  BPPV (benign paroxysmal positional vertigo), right       G-Codes - 05-04-2016 1634    Functional Assessment Tool Used Dizziness resolved after first treatment   Functional Limitation Self care   Self Care Goal Status ([id number]) At least 20 percent but less than 40 percent impaired, limited or restricted   Self Care Discharge Status [phone]) At least 20 percent but less than 40 percent impaired, limited or restricted      Problem List Patient Active Problem List   Diagnosis Date Noted  . Ataxia, late effect of cerebrovascular disease 04/23/2014  . Right pontine stroke ([place]) 04/18/2014  .  Hypothyroidism, postsurgical 04/18/2014  . Dysphagia as late effect of stroke 04/18/2014  . Essential hypertension, benign 04/18/2014  . Constipation, slow transit 04/18/2014  . 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 Hypothyroidism   . Hypertension   . Senile osteoporosis   . Hyperlipidemia LDL goal < 100   . Alzheimer's disease     Amand Lemoine, PT 2016-05-04, 4:37 PM  [facility] [address] [place], [place], [address] Phone: [phone]   Fax:  [phone]  Name: Shelia Marks MRN: [id number] Date of Birth: 1926/05/12

## 2016-04-24 ENCOUNTER — Other Ambulatory Visit: Payer: Self-pay

## 2016-04-24 DIAGNOSIS — Z1231 Encounter for screening mammogram for malignant neoplasm of breast: Secondary | ICD-10-CM

## 2016-04-28 IMAGING — CT CT ABD-PELV W/ CM
3 of 5 series · 12 of 36 positions shown, 18 images · IV contrast (iopamidol)
Comparison: None.

CLINICAL DATA: 88-year-old female with a reported history of
vascular dementia, weight loss, hysterectomy, generalized weakness
and anorexia.

EXAM:
CT ABDOMEN AND PELVIS WITH CONTRAST
TECHNIQUE: Multidetector CT imaging of the abdomen and pelvis was performed
using the standard protocol following bolus administration of
intravenous contrast.
CONTRAST:  100mL ZWQDQK-PCC IOPAMIDOL (ZWQDQK-PCC) INJECTION 61%

[Series 3: abd/pelvis with · axial · 0.79mm/px · z∈[-310,-30]mm · 6 of 80 slices shown, 11 images]
[im 12/80  soft-tissue]
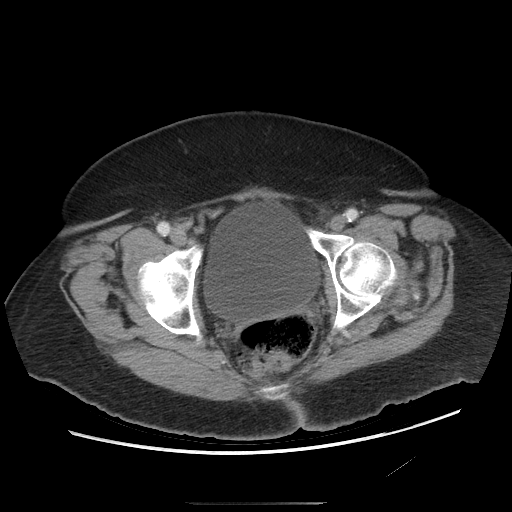
[im 12/80  bone]
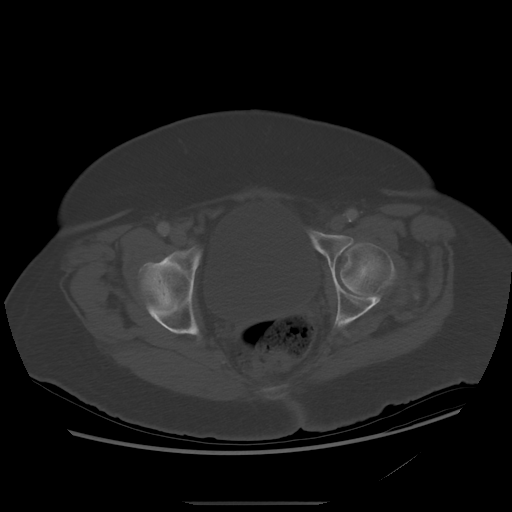
[im 23/80  soft-tissue]
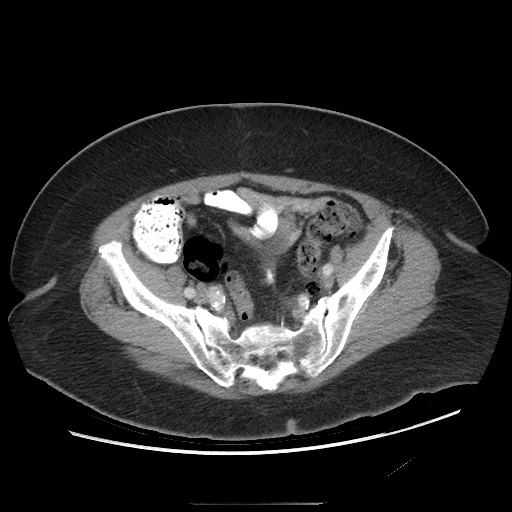
[im 34/80  soft-tissue]
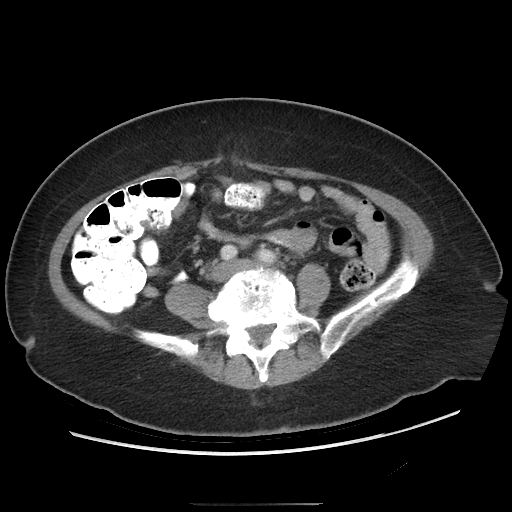
[im 34/80  lung]
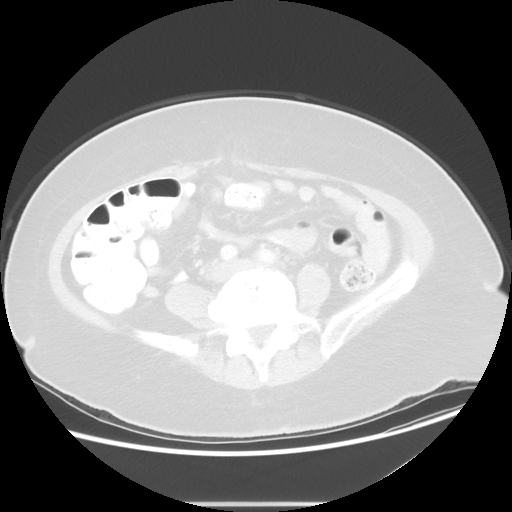
[im 46/80  soft-tissue]
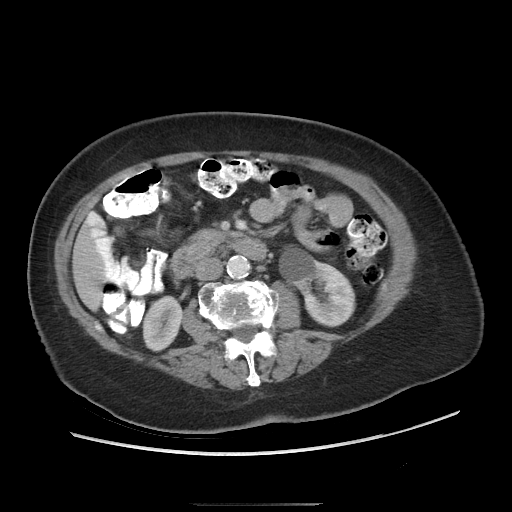
[im 46/80  lung]
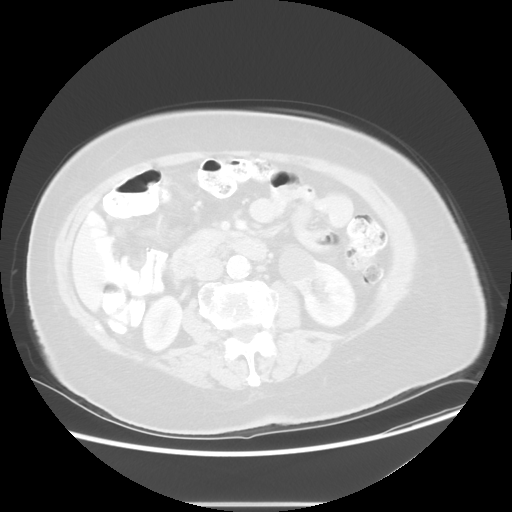
[im 57/80  soft-tissue]
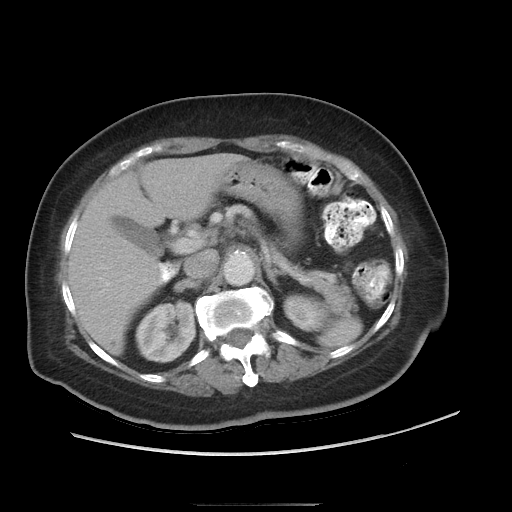
[im 57/80  lung]
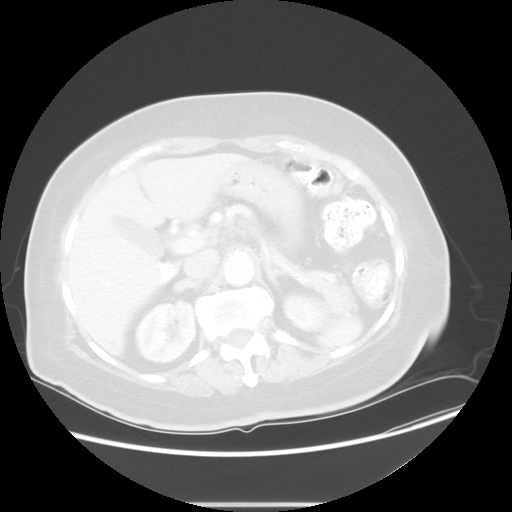
[im 68/80  soft-tissue]
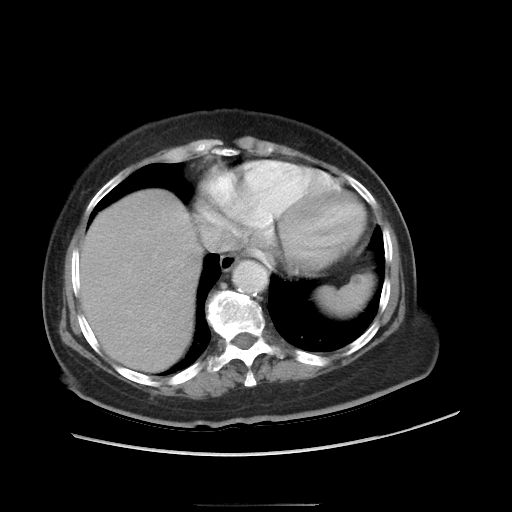
[im 68/80  lung]
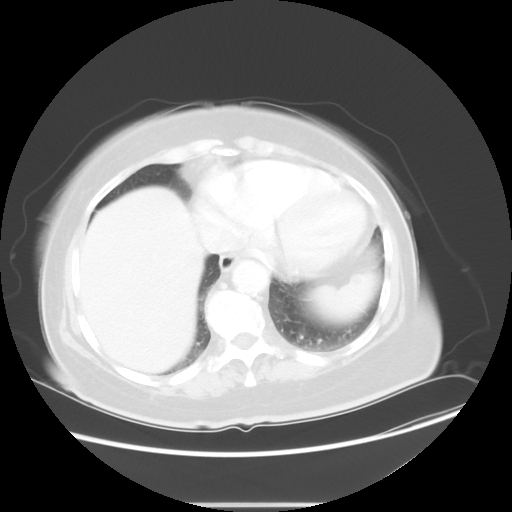

[Series 601: coronal body · coronal · 0.84mm/px · 1 of 113 slices shown, 2 images]
[im 38/113  soft-tissue]
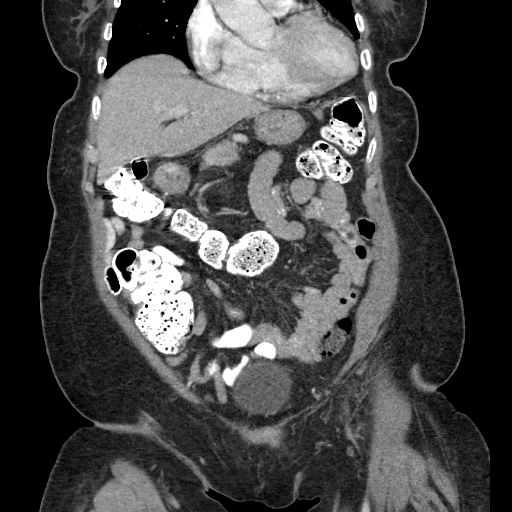
[im 38/113  bone]
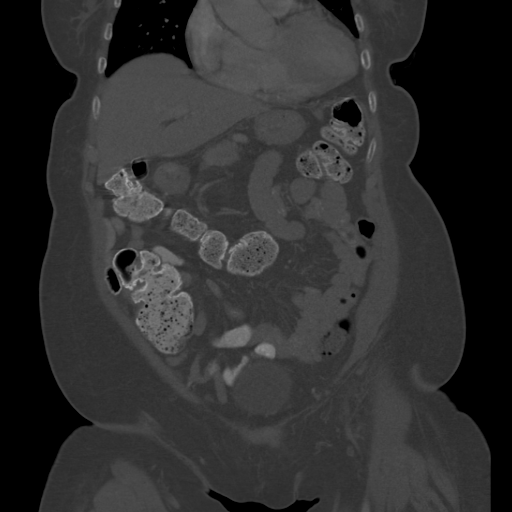

[Series 602: sagittal body · sagittal · 0.84mm/px · 5 of 162 slices shown]
[im 19/162  soft-tissue]
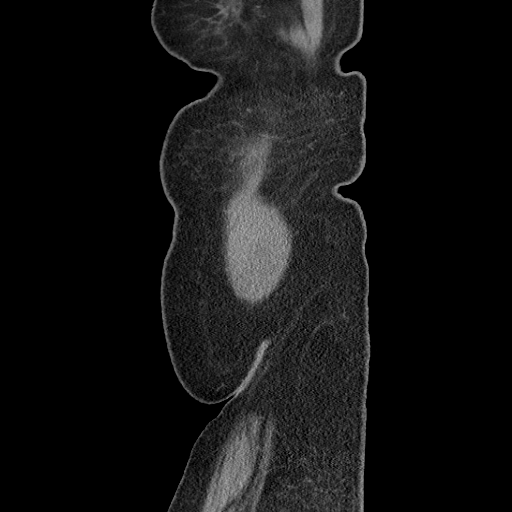
[im 38/162  soft-tissue]
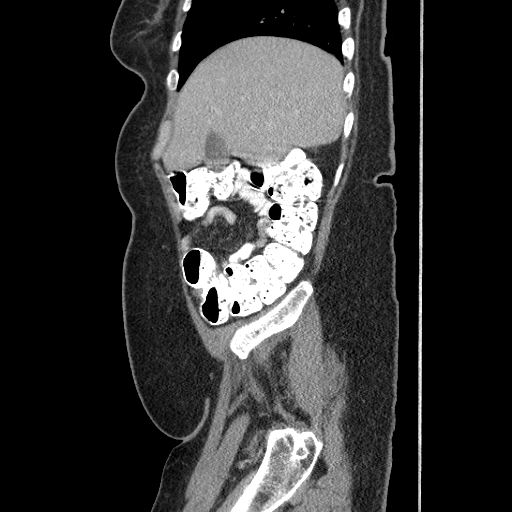
[im 57/162  soft-tissue]
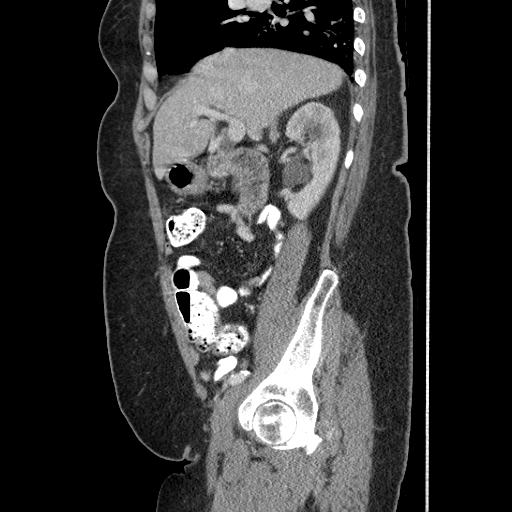
[im 76/162  soft-tissue]
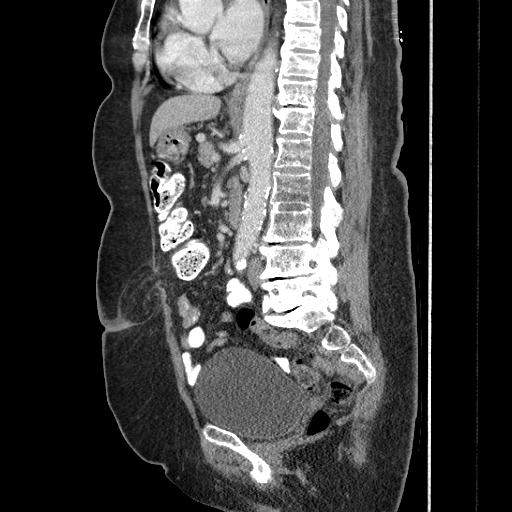
[im 95/162  soft-tissue]
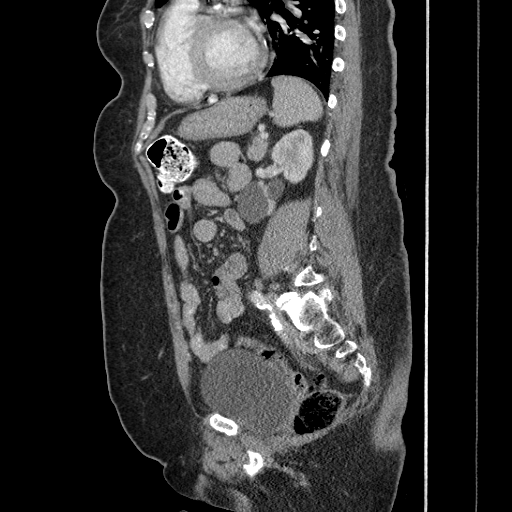

[12 of 36 positions shown; findings below may reference images not displayed]

FINDINGS: Lower chest: Mild hypoventilatory changes in the dependent lung
bases. Coronary artery calcifications are present in the left
circumflex coronary artery.

Hepatobiliary: Hypodense 0.6 cm lesion in the posterior right liver
lobe, too small to characterize, statistically likely a benign cyst
or hemangioma. Subcentimeter coarse calcification in the anterior
left liver lobe is likely from prior granulomatous disease.
Otherwise normal liver. Normal gallbladder with no radiopaque
cholelithiasis. No intrahepatic biliary ductal dilatation. Common
bile duct measures 8 mm diameter, within expected limits for
patient's age. No radiopaque choledocholithiasis.

Pancreas: Normal.

Spleen: Normal size spleen. Hypodense 0.7 cm lesion in the superior
spleen, too small to characterize, statistically likely to represent
a benign lesion. Focal subcapsular calcification in the posterior
inferior spleen, likely from prior injury or granulomatous disease.

Adrenals/Urinary Tract: Mild irregular thickening of both adrenal
glands without discrete adrenal lesion, likely benign hyperplasia.
Simple 2.7 cm renal cyst in the medial lower left kidney. Additional
subcentimeter hypodense lesions in both kidneys are too small to
characterize. No hydronephrosis.

Stomach/Bowel: Small hiatal hernia. Otherwise collapsed and grossly
normal stomach. Normal caliber small bowel with no small bowel wall
thickening. Appendix is not discretely visualized. Mild
diverticulosis in the descending and sigmoid colon, with no evidence
of acute diverticulitis. Oral contrast traverses to the distal
colon. No colonic wall thickening.

Vascular/Lymphatic: Small 2.6 cm saccular aneurysm of the
atherosclerotic infrarenal abdominal aorta. No lymphadenopathy in
the abdomen or pelvis.

Reproductive: Status post hysterectomy, with no abnormal findings at
the vaginal cuff. No adnexal abnormality.

Other: No pneumoperitoneum. No ascites. No intra-abdominal fluid
collections.

Musculoskeletal: Small to moderate fat containing supraumbilical
ventral abdominal wall hernia. Marked degenerative changes in the
visualized thoracolumbar spine. No suspicious focal osseous lesions.
IMPRESSION: 1. No evidence of a primary malignancy or metastatic disease in the
abdomen or pelvis.
2. Small 2.6 cm infrarenal saccular abdominal aortic aneurysm.
3. Small to moderate fat containing supraumbilical abdominal hernia.
4. Additional chronic findings as above.

## 2016-05-07 ENCOUNTER — Ambulatory Visit
Admission: RE | Admit: 2016-05-07 | Discharge: 2016-05-07 | Disposition: A | Discharge status: Home / Self Care | Payer: Medicare Other | Source: Ambulatory Visit

## 2016-05-07 DIAGNOSIS — Z1231 Encounter for screening mammogram for malignant neoplasm of breast: Secondary | ICD-10-CM | POA: Diagnosis not present

## 2016-05-09 ENCOUNTER — Other Ambulatory Visit: Payer: Self-pay | Admitting: Internal Medicine

## 2016-05-09 DIAGNOSIS — R928 Other abnormal and inconclusive findings on diagnostic imaging of breast: Secondary | ICD-10-CM

## 2016-05-14 ENCOUNTER — Encounter: Payer: Self-pay | Admitting: Rehabilitative and Restorative Service Providers"

## 2016-05-14 NOTE — Therapy (Signed)
[facility] [address] [place], [place], [address] Phone: [phone]   Fax:  [phone]  Patient Details  Name: Shelia Marks MRN: [id number] Date of Birth: September 29, 1926 Referring Provider:  No ref. provider found  Encounter Date: last encounter 04/20/2016  PHYSICAL THERAPY DISCHARGE SUMMARY  Visits from Start of Care: 2  Current functional level related to goals / functional outcomes:     PT Short Term Goals - 04/20/16 1338    PT SHORT TERM GOAL #1   Title The patient will return demo HEP with sister's assistance for habituation and balance activities as indicated.   Baseline Met on 04/20/2016   Time 4   Period Weeks   Status Achieved   PT SHORT TERM GOAL #2   Title The patient will have negative right sidelying test indicating resolution of BPPV.   Baseline Met on 04/20/2016   Time 4   Period Weeks   Status Achieved         PT Long Term Goals - 04/20/16 1635    PT LONG TERM GOAL #1   Title Thepatient will reduce Dizziness handicap inventory from 58% to < or equal to 45% to demo improved subjective perception of dizziness.   Baseline Target date: 06/01/2016   Time 6   Period Weeks   PT LONG TERM GOAL #2   Title The patient will improve gait speed from 0.83 ft/sec to > or equal to 1.3 ft/sec to indicate improved "limited community" ambulation.   Baseline Target date 06/01/2016   Time 8   Period Weeks    *long term goals not reassessed as patient did not return after vertigo cleared.    Remaining deficits: Imbalance, fall risk.  PT educated patient and her sister on fall risk.  Provided HEP that family wishes to assist with versus continuing PT.      Education / Equipment: HEP, home safety.  PT educated patient/family to call clinic within 3-4 weeks if vertigo returned.  No follow-up initiated, therefore d/c'ing patient.  Plan: Patient agrees to discharge.  Patient goals were partially met. Patient  is being discharged due to meeting the stated rehab goals.  ?????       Thank you for the referral of this patient. Rudell Cobb, MPT    [facility] 05/14/2016, 10:11 AM  [facility] [address] [place], [place], [address] Phone: [phone]   Fax:  [phone]

## 2016-05-15 ENCOUNTER — Ambulatory Visit
Admission: RE | Admit: 2016-05-15 | Discharge: 2016-05-15 | Disposition: A | Discharge status: Home / Self Care | Payer: Medicare Other | Source: Ambulatory Visit | Attending: Internal Medicine | Admitting: Internal Medicine

## 2016-05-15 ENCOUNTER — Other Ambulatory Visit: Payer: Self-pay | Admitting: Internal Medicine

## 2016-05-15 DIAGNOSIS — R921 Mammographic calcification found on diagnostic imaging of breast: Secondary | ICD-10-CM

## 2016-05-15 DIAGNOSIS — R928 Other abnormal and inconclusive findings on diagnostic imaging of breast: Secondary | ICD-10-CM

## 2016-05-21 ENCOUNTER — Ambulatory Visit
Admission: RE | Admit: 2016-05-21 | Discharge: 2016-05-21 | Disposition: A | Discharge status: Home / Self Care | Payer: Medicare Other | Source: Ambulatory Visit | Attending: Internal Medicine | Admitting: Internal Medicine

## 2016-05-21 DIAGNOSIS — D0512 Intraductal carcinoma in situ of left breast: Secondary | ICD-10-CM | POA: Diagnosis not present

## 2016-05-21 DIAGNOSIS — R921 Mammographic calcification found on diagnostic imaging of breast: Secondary | ICD-10-CM

## 2016-05-21 HISTORY — PX: BREAST BIOPSY: SHX20

## 2016-05-24 DIAGNOSIS — D0512 Intraductal carcinoma in situ of left breast: Secondary | ICD-10-CM | POA: Diagnosis not present

## 2016-05-25 ENCOUNTER — Telehealth: Payer: Self-pay | Admitting: Hematology

## 2016-05-25 NOTE — Telephone Encounter (Signed)
Telephone call to patient to schedule an appointment. There wasn't a voicemail to leave a message. Will try to call patient on 5/30.

## 2016-05-30 ENCOUNTER — Telehealth: Payer: Self-pay | Admitting: Oncology

## 2016-05-30 ENCOUNTER — Encounter: Payer: Self-pay | Admitting: Oncology

## 2016-05-30 NOTE — Telephone Encounter (Signed)
Telephone call to Ms. Shelia Marks's emergency contact due to her being unable to hear. Ms. Shelia Marks was informed regarding the patient's appointment date and time with Dr. Jana Hakim. She agreed. Demographics were verified and letter sent to the referring office.

## 2016-06-01 ENCOUNTER — Telehealth: Payer: Self-pay | Admitting: *Deleted

## 2016-06-01 ENCOUNTER — Ambulatory Visit (INDEPENDENT_AMBULATORY_CARE_PROVIDER_SITE_OTHER): Payer: Medicare Other | Admitting: Internal Medicine

## 2016-06-01 ENCOUNTER — Encounter: Payer: Self-pay | Admitting: Internal Medicine

## 2016-06-01 VITALS — BP 148/80 | HR 62 | Temp 98.3°F | Ht 61.0 in | Wt 157.0 lb

## 2016-06-01 DIAGNOSIS — R634 Abnormal weight loss: Secondary | ICD-10-CM | POA: Diagnosis not present

## 2016-06-01 DIAGNOSIS — F0631 Mood disorder due to known physiological condition with depressive features: Secondary | ICD-10-CM | POA: Diagnosis not present

## 2016-06-01 DIAGNOSIS — E890 Postprocedural hypothyroidism: Secondary | ICD-10-CM | POA: Diagnosis not present

## 2016-06-01 DIAGNOSIS — I639 Cerebral infarction, unspecified: Secondary | ICD-10-CM

## 2016-06-01 DIAGNOSIS — I10 Essential (primary) hypertension: Secondary | ICD-10-CM | POA: Diagnosis not present

## 2016-06-01 DIAGNOSIS — Z23 Encounter for immunization: Secondary | ICD-10-CM | POA: Diagnosis not present

## 2016-06-01 DIAGNOSIS — Z0000 Encounter for general adult medical examination without abnormal findings: Secondary | ICD-10-CM

## 2016-06-01 DIAGNOSIS — F320 Major depressive disorder, single episode, mild: Secondary | ICD-10-CM | POA: Insufficient documentation

## 2016-06-01 DIAGNOSIS — F0150 Vascular dementia without behavioral disturbance: Secondary | ICD-10-CM | POA: Diagnosis not present

## 2016-06-01 DIAGNOSIS — D0512 Intraductal carcinoma in situ of left breast: Secondary | ICD-10-CM

## 2016-06-01 DIAGNOSIS — IMO0002 Reserved for concepts with insufficient information to code with codable children: Secondary | ICD-10-CM

## 2016-06-01 NOTE — Telephone Encounter (Signed)
Received appt date/time from Andrea.  Mailed new pt packet to pt.  

## 2016-06-01 NOTE — Progress Notes (Signed)
Patient ID: Shelia Marks, female   DOB: Aug 07, 1926, 82 y.o.   MRN: [id number]  MMSE 23/30 failed clock drawing   Location:  [facility] clinic Provider: Correne Lalani L. Mariea Clonts, D.O., C.M.D.  Patient Care Team: Gayland Curry, DO as PCP - General (Geriatric Medicine) Rutherford Guys, MD as Consulting Physician (Ophthalmology)  Extended Emergency Contact Information Primary Emergency Contact: Davis,[name] Address: [address] [place]          [place] [address] Johnnette [place]itter of [place] Phone: [phone] Mobile Phone: [phone] Relation: Sister  Code Status: DNR Goals of Care: Advanced Directive information Advanced Directives 06/01/2016  Does patient have an advance directive? No  Type of Advance Directive -  Does patient want to make changes to advanced directive? -  Copy of advanced directive(s) in chart? -     Chief Complaint  Patient presents with  . Annual Exam    wellness  . MMSE    23/30 failed clock drawing    HPI: Patient is a 82 y.o. female seen in today for an annual wellness exam.    On screening mammogram on 05/07/16, left breast calcifications were discovered. Diagnostic 5/16 showed suspicious left breast calcifications, as well and stereotactic biopsy was performed on 5/22. Pathology revealed HIGH GRADE DUCTAL CARCINOMA IN SITU WITH COMEDONECROSIS AND CALCIFICATIONS of the upper outer quadrant of the left breast.  No discharge from the site. Saw Dr. Marlou Starks:  Surgical consultation performed.  Due to her personal goals, she has opted for medical mgt only and now has appt with Dr. Jana Hakim.    Feeling weak and has been losing weight, has been more anemic for some time.  Was resistant to testing at first.    Has difficulty using her hand since her stroke.    Depression screen [facility] 2/9 06/01/2016 04/07/2015 12/02/2014 08/27/2014 06/08/2013  Decreased Interest 0 0 0 1 0  Down, Depressed, Hopeless 0 0 0 1 0  PHQ - 2 Score 0 0 0 2 0  Altered sleeping - - - 0 -  Tired, decreased  energy - - - 2 -  Change in appetite - - - 0 -  Feeling bad or failure about yourself  - - - 0 -  Trouble concentrating - - - 0 -  Moving slowly or fidgety/restless - - - 0 -  Suicidal thoughts - - - 0 -  PHQ-9 Score - - - 4 -    Fall Risk  06/01/2016 02/02/2016 10/31/2015 07/28/2015 07/14/2015  Falls in the past year? Yes No No No No  Number falls in past yr: 1 - - - -  Injury with Fall? Yes - - - -   MMSE - Mini Mental State Exam 06/01/2016 05/27/2014 06/08/2013  Orientation to time 5 3 3   Orientation to Place 5 4 5   Registration 3 3 3   Attention/ Calculation 3 3 3   Recall 0 0 1  Language- name 2 objects 2 2 2   Language- repeat 1 1 1   Language- follow 3 step command 2 3 3   Language- read & follow direction 1 1 1   Write a sentence 1 1 1   Copy design 0 1 0  Total score 23 22 23   failed clock drawing  Health Maintenance  Topic Date Due  . TETANUS/TDAP  12/08/1947  . ZOSTAVAX  12/07/1988  . PNA vac Low Risk Adult (2 of 2 - PPSV23) 08/28/2015  . INFLUENZA VACCINE  07/31/2016  . DEXA SCAN  Completed   Urinary incontinence?  Does not  always make it on time.  Goes frequently. Functional Status Survey: Is the patient deaf or have difficulty hearing?: Yes Does the patient have difficulty seeing, even when wearing glasses/contacts?: No Does the patient have difficulty concentrating, remembering, or making decisions?: Yes Does the patient have difficulty walking or climbing stairs?: Yes Does the patient have difficulty dressing or bathing?: No Does the patient have difficulty doing errands alone such as visiting a doctor's office or shopping?: Yes (sister helps her) Exercise?  Does exercise around the house Diet?  No special diet.  Is taking her ensure. Vision Screening Comments: Refuses eye exam Hearing: HOH. Dentition:  No problems there--dentures not like her own.  Able to eat with them. Pain:  No pain.    Past Medical History  Diagnosis Date  . Hypothyroidism   . Cancer ([place])    . Hypertension   . Sleep related leg cramps   . Senile osteoporosis   . Malignant neoplasm of thyroid gland ([place])   . Nontoxic uninodular goiter   . Dizziness and giddiness   . Other symptoms involving cardiovascular system   . Alzheimer's disease   . Varicose veins of lower extremities with inflammation   . Disorder of bone and cartilage, unspecified   . Hyperlipidemia LDL goal < 100   . Unspecified disorder of lipoid metabolism   . Unspecified hereditary and idiopathic peripheral neuropathy   . Phlebitis and thrombophlebitis of unspecified site   . Other malaise and fatigue   . Stroke [facility])     Past Surgical History  Procedure Laterality Date  . Abdominal hysterectomy      DR HUGES  . Knee surgery      right  . Colon surgery    . Thyroid surgery  July 20, 2011  . Parathyroid adenoma removed  1983  . Catract surgery  2008    Social History   Social History  . Marital Status: Widowed    Spouse Name: N/A  . Number of Children: N/A  . Years of Education: N/A   Occupational History  . Not on file.   Social History Main Topics  . Smoking status: Never Smoker   . Smokeless tobacco: Never Used  . Alcohol Use: No  . Drug Use: No  . Sexual Activity: Not on file   Other Topics Concern  . Not on file   Social History Narrative    No Known Allergies    Medication List       This list is accurate as of: 06/01/16  9:39 AM.  Always use your most recent med list.               allopurinol 300 MG tablet  Commonly known as:  ZYLOPRIM  TAKE 1 TABLET EVERY DAY     amLODipine 10 MG tablet  Commonly known as:  NORVASC  TAKE 1 TABLET EVERY DAY     aspirin EC 81 MG tablet  Take 1 tablet (81 mg total) by mouth daily.     atenolol 25 MG tablet  Commonly known as:  TENORMIN  TAKE 1 TABLET EVERY DAY     atorvastatin 20 MG tablet  Commonly known as:  LIPITOR  TAKE 1 TABLET ONE TIME DAILY AT 6PM FOR CHOLESTEROL     calcium carbonate 500 MG chewable tablet   Commonly known as:  TUMS - dosed in mg elemental calcium  Chew 1 tablet by mouth as needed for indigestion or heartburn.     iron polysaccharides 150  MG capsule  Commonly known as:  NIFEREX  Take 1 capsule (150 mg total) by mouth every other day.     levothyroxine 100 MCG tablet  Commonly known as:  SYNTHROID, LEVOTHROID  TAKE 1 TABLET EVERY DAY ON AN EMPTY STOMACH AND SEPARATED FROM OTHER MEDICATIONS     lisinopril 2.5 MG tablet  Commonly known as:  PRINIVIL,ZESTRIL  TAKE ONE TABLET ONCE DAILY TO CONTROL BLOOD PRESSURE     sertraline 50 MG tablet  Commonly known as:  ZOLOFT  TAKE 1 TABLET EVERY DAY  FOR  DEPRESSION         Review of Systems:  Review of Systems  Constitutional: Positive for weight loss and malaise/fatigue. Negative for fever, chills and diaphoresis.  HENT: Positive for hearing loss. Negative for congestion.   Eyes: Negative for blurred vision.       Refused to do eye exam for [place]  Respiratory: Negative for cough and shortness of breath.   Cardiovascular: Negative for chest pain, palpitations and leg swelling.  Gastrointestinal: Negative for abdominal pain, constipation, blood in stool and melena.  Genitourinary: Positive for urgency and frequency. Negative for dysuria, hematuria and flank pain.  Musculoskeletal: Positive for falls. Negative for myalgias, back pain, joint pain and neck pain.  Skin: Negative for itching and rash.  Neurological: Positive for focal weakness and weakness. Negative for dizziness, loss of consciousness and headaches.  Psychiatric/Behavioral: Positive for depression and memory loss. The patient does not have insomnia.     Physical Exam: Filed Vitals:   06/01/16 0847  BP: 148/80  Pulse: 62  Temp: 98.3 F (36.8 C)  TempSrc: Oral  Height: 5\' 1"  (1.549 m)  Weight: 157 lb (71.215 kg)  SpO2: 98%   Body mass index is 29.68 kg/(m^2). Physical Exam  Constitutional: She is oriented to person, place, and time. She appears  well-developed and well-nourished. No distress.  HENT:  Head: Normocephalic and atraumatic.  Right Ear: External ear normal.  Left Ear: External ear normal.  Nose: Nose normal.  Mouth/Throat: Oropharynx is clear and moist. No oropharyngeal exudate.  Eyes: Conjunctivae and EOM are normal. Pupils are equal, round, and reactive to light.  Neck: Normal range of motion. Neck supple. No JVD present.  Cardiovascular: Normal rate, regular rhythm, normal heart sounds and intact distal pulses.   Pulmonary/Chest: Effort normal and breath sounds normal.  Abdominal: Soft. Bowel sounds are normal.  Musculoskeletal: Normal range of motion.  left hand weakness persists 4+/5; left leg 4/5  Lymphadenopathy:    She has no cervical adenopathy.  Neurological: She is alert and oriented to person, place, and time. A cranial nerve deficit is present.  Minor dysarthria persists  Skin: Skin is warm and dry.  Psychiatric: She has a normal mood and affect.    Labs reviewed: Basic Metabolic Panel:  Recent Labs  07/11/15 1232 11/09/15 1013 02/02/16 1128 03/09/16 1837  NA 144 142 143 143  K 3.8 3.8 4.0 3.3*  CL 103 103 103 108  CO2 26 25 24 24   GLUCOSE 82 79 76 112*  BUN 18 16 16 12   CREATININE 0.85 0.87 0.86 0.88  CALCIUM 9.5 9.6 9.9 9.8  TSH 1.370 2.720 2.750  --    Liver Function Tests:  Recent Labs  07/11/15 1232 11/09/15 1013  AST 16 18  ALT 12 13  ALKPHOS 70 83  BILITOT 0.4 0.5  PROT 7.0 6.8  ALBUMIN 3.8 4.1   No results for input(s): LIPASE, AMYLASE in the last 8760  hours. No results for input(s): AMMONIA in the last 8760 hours. CBC:  Recent Labs  11/09/15 1013 02/02/16 1128 03/09/16 1837  WBC 3.6 4.0 5.6  NEUTROABS 2.0 2.2 3.1  HGB  --   --  11.8*  HCT 36.4 37.4 37.1  MCV 92 90 90.0  PLT 180 189 179   Lipid Panel:  Recent Labs  07/11/15 1232 11/09/15 1013  CHOL 167 166  HDL 39* 60  LDLCALC 107* 90  TRIG 105 81  CHOLHDL 4.3 2.8   Lab Results  Component Value  Date   HGBA1C 5.0 11/09/2015    Procedures: Mm Digital Diagnostic Unilat L  05/21/2016  CLINICAL DATA:  Post biopsy mammogram of the left breast for clip placement. EXAM: DIAGNOSTIC LEFT MAMMOGRAM POST STEREOTACTIC BIOPSY COMPARISON:  Previous exam(s). FINDINGS: Mammographic images were obtained following stereotactic guided biopsy of calcifications in the upper-outer left breast. The coil shaped biopsy marking clip is appropriately positioned at the intended site of biopsy in the upper-outer quadrant of the left breast. IMPRESSION: Appropriate positioning of the coil shaped biopsy marking clip at the site of biopsied calcifications in the upper-outer left breast. Final Assessment: Post Procedure Mammograms for Marker Placement Electronically Signed   By: Ammie Ferrier M.D.   On: 05/21/2016 08:59   Mm Digital Diagnostic Unilat L  05/15/2016  CLINICAL DATA:  Screening recall for left breast calcifications. EXAM: DIGITAL DIAGNOSTIC LEFT MAMMOGRAM COMPARISON:  Previous exam(s). ACR Breast Density Category b: There are scattered areas of fibroglandular density. FINDINGS: Spot compression magnification views were performed over the upper-outer left breast. There are suspicious calcifications varying in shape size and density spanning a distance of approximately 1.7 cm. IMPRESSION: Suspicious left breast calcifications. RECOMMENDATION: Stereotactic guided biopsy of the suspicious calcifications in the upper-outer left breast is recommended. This is scheduled for 05/21/2016 at 8 a.m. I have discussed the findings and recommendations with the patient. Results were also provided in writing at the conclusion of the visit. If applicable, a reminder letter will be sent to the patient regarding the next appointment. BI-RADS CATEGORY  5: Highly suggestive of malignancy. Electronically Signed   By: Everlean Alstrom M.D.   On: 05/15/2016 12:42   Mm Digital Screening Bilateral  05/07/2016  CLINICAL DATA:  Screening.  EXAM: DIGITAL SCREENING BILATERAL MAMMOGRAM WITH CAD COMPARISON:  Previous exam(s). ACR Breast Density Category b: There are scattered areas of fibroglandular density. FINDINGS: In the left breast, calcifications warrant further evaluation. In the right breast, no findings suspicious for malignancy. Images were processed with CAD. IMPRESSION: Further evaluation is suggested for calcifications in the left breast. RECOMMENDATION: Diagnostic mammogram of the left breast. (Code:[id number]) The patient will be contacted regarding the findings, and additional imaging will be scheduled. BI-RADS CATEGORY  0: Incomplete. Need additional imaging evaluation and/or prior mammograms for comparison. Electronically Signed   By: Lajean Manes M.D.   On: 05/07/2016 11:01   Mm Lt Breast Bx W Loc Dev 1st Lesion Image Bx Spec Stereo Guide  05/22/2016  ADDENDUM REPORT: 05/22/2016 12:35 ADDENDUM: Pathology revealed HIGH GRADE DUCTAL CARCINOMA IN SITU WITH COMEDONECROSIS AND CALCIFICATIONS of the upper outer quadrant of the Left breast. This was found to be concordant by Dr. Ammie Ferrier. Pathology results were discussed with the patient and her sister, Bryon Lions, by telephone. The patient reported doing well after the biopsy with oozing at the site. Post biopsy instructions and care were reviewed and questions were answered. The patient was encouraged to call [facility] for any additional concerns. Surgical consultation has been arranged with Dr. Autumn Messing at [facility] on May 24, 2016. Pathology results reported by Terie Purser, RN on 05/22/2016. Electronically Signed   By: Ammie Ferrier M.D.   On: 05/22/2016 12:35  05/22/2016  CLINICAL DATA:  82 year old female presenting for stereotactic biopsy of left breast calcifications. EXAM: LEFT BREAST STEREOTACTIC CORE NEEDLE BIOPSY COMPARISON:  Previous exams. FINDINGS: The patient and I discussed the procedure of stereotactic-guided  biopsy including benefits and alternatives. We discussed the high likelihood of a successful procedure. We discussed the risks of the procedure including infection, bleeding, tissue injury, clip migration, and inadequate sampling. Informed written consent was given. The usual time out protocol was performed immediately prior to the procedure. Using sterile technique and 1% Lidocaine as local anesthetic, under stereotactic guidance, a 9 gauge vacuum assisted device was used to perform core needle biopsy of calcifications in the upper-outer quadrant of the left breast using a superior approach. Specimen radiograph was performed showing calcifications within multiple core samples. Specimens with calcifications are identified for pathology. At the conclusion of the procedure, a coil shaped tissue marker clip was deployed into the biopsy cavity. Follow-up 2-view mammogram was performed and dictated separately. IMPRESSION: Stereotactic-guided biopsy of calcifications in the upper-outer left breast. No apparent complications. Electronically Signed: By: Ammie Ferrier M.D. On: 05/21/2016 08:46    Assessment/Plan 1. Medicare annual wellness visit, subsequent -performed today -still has not had f/u bone density study, but currently tied up with breast cancer diagnosis and related treatment appts -will plan on ordering this when things calm down -given pneumovax  2. Ductal carcinoma in situ (DCIS) of left breast - is for labs and visit with Dr. Jana Hakim to being oral therapy for breast cancer - CBC with Differential/Platelet - Basic metabolic panel - Prealbumin  3. Hypothyroidism, postsurgical - cont current synthroid and check tsh - TSH  4. Vascular dementia, without behavioral disturbance -MMSE actually up one point from last time -sister is helping her some with meals, home chores, etc.   5. Depression due to stroke ([place]) -stable, not tearful like she'd been soon after stroke even with new breast  cancer diagnosis  6. Essential hypertension, benign - bp at goal, cont same meds - Basic metabolic panel  7. Loss of weight - has breast cancer now - had been losing weight and weak for some time now -also question if stroke related to her breast cancer - Basic metabolic panel - Prealbumin  8. Need for prophylactic vaccination against Streptococcus pneumoniae (pneumococcus) -pneumovax was given  Labs/tests ordered:   Orders Placed This Encounter  Procedures  . Pneumococcal polysaccharide vaccine 23-valent greater than or equal to 2yo subcutaneous/IM  . CBC with Differential/Platelet  . TSH  . Basic metabolic panel  . Prealbumin    Next appt:  08/02/2016 f/u breast cancer   Berley Gambrell L. Argelia Formisano, D.O. [facility] [address], [place] [address] Cell Phone (Mon-Fri 8am-5pm):  [phone] On Call:  [phone] & follow prompts after 5pm & weekends Office Phone:  [phone] Office Fax:  [phone]

## 2016-06-02 LAB — CBC WITH DIFFERENTIAL/PLATELET
Basophils Absolute: 0 10*3/uL (ref 0.0–0.2)
Basos: 1 %
EOS (ABSOLUTE): 0.1 10*3/uL (ref 0.0–0.4)
Eos: 3 %
Hematocrit: 37.1 % (ref 34.0–46.6)
Hemoglobin: 11.7 g/dL (ref 11.1–15.9)
Immature Grans (Abs): 0 10*3/uL (ref 0.0–0.1)
Immature Granulocytes: 1 %
Lymphocytes Absolute: 1.1 10*3/uL (ref 0.7–3.1)
Lymphs: 29 %
MCH: 29.3 pg (ref 26.6–33.0)
MCHC: 31.5 g/dL (ref 31.5–35.7)
MCV: 93 fL (ref 79–97)
Monocytes Absolute: 0.3 10*3/uL (ref 0.1–0.9)
Monocytes: 8 %
Neutrophils Absolute: 2.2 10*3/uL (ref 1.4–7.0)
Neutrophils: 58 %
Platelets: 193 10*3/uL (ref 150–379)
RBC: 3.99 x10E6/uL (ref 3.77–5.28)
RDW: 16 % — ABNORMAL HIGH (ref 12.3–15.4)
WBC: 3.8 10*3/uL (ref 3.4–10.8)

## 2016-06-02 LAB — BASIC METABOLIC PANEL
BUN/Creatinine Ratio: 21 (ref 12–28)
BUN: 21 mg/dL (ref 8–27)
CO2: 26 mmol/L (ref 18–29)
Calcium: 9.8 mg/dL (ref 8.7–10.3)
Chloride: 102 mmol/L (ref 96–106)
Creatinine, Ser: 0.98 mg/dL (ref 0.57–1.00)
GFR calc Af Amer: 60 mL/min/{1.73_m2} (ref >59)
GFR calc non Af Amer: 52 mL/min/{1.73_m2} — ABNORMAL LOW (ref >59)
Glucose: 78 mg/dL (ref 65–99)
Potassium: 3.9 mmol/L (ref 3.5–5.2)
Sodium: 143 mmol/L (ref 134–144)

## 2016-06-02 LAB — PREALBUMIN: PREALBUMIN: 25 mg/dL (ref 9–32)

## 2016-06-02 LAB — TSH: TSH: 1.85 u[IU]/mL (ref 0.450–4.500)

## 2016-06-12 ENCOUNTER — Other Ambulatory Visit: Payer: Self-pay

## 2016-06-13 ENCOUNTER — Other Ambulatory Visit: Payer: Medicare Other

## 2016-06-13 ENCOUNTER — Ambulatory Visit (HOSPITAL_BASED_OUTPATIENT_CLINIC_OR_DEPARTMENT_OTHER): Payer: Medicare Other | Admitting: Oncology

## 2016-06-13 VITALS — BP 157/78 | HR 67 | Temp 98.8°F | Resp 16 | Ht 61.0 in | Wt 159.8 lb

## 2016-06-13 DIAGNOSIS — Z170 Estrogen receptor positive status [ER+]: Secondary | ICD-10-CM | POA: Diagnosis not present

## 2016-06-13 DIAGNOSIS — D0512 Intraductal carcinoma in situ of left breast: Secondary | ICD-10-CM | POA: Diagnosis not present

## 2016-06-13 MED ORDER — ANASTROZOLE 1 MG PO TABS: 1.0000 mg | ORAL_TABLET | Freq: Every day | ORAL | Status: DC

## 2016-06-13 NOTE — Progress Notes (Signed)
Shelia Marks  [phone]) [phone] [phone]     ID: Shelia Marks DOB: 19-Jun-1926  MR#: [id number]  [id number]  Patient Care Team: Gayland Curry, DO as PCP - General (Geriatric Medicine) Rutherford Guys, MD as Consulting Physician (Ophthalmology) Chauncey Cruel, MD as Consulting Physician (Oncology) Autumn Messing III, MD as Consulting Physician (General Surgery) Jacelyn Pi, MD as Consulting Physician (Endocrinology) PCP: Hollace Kinnier, DO GYN: OTHER MD:  CHIEF COMPLAINT: Ductal carcinoma in situ  CURRENT TREATMENT: Anastrozole versus surgery   BREAST CANCER HISTORY: "Shelia Marks" had bilateral screening mammography at the [facility] 06/07/2016 showing calcifications in the left breast. She was recalled  05/15/2016 for left diagnostic mammography. This showed the breast density to be category be. In the upper outer left breast there was an area of suspicious calcifications measuring 1.7 cm.  Biopsy of this area was obtained 05/21/2016, and showed ([id number]) ductal carcinoma in situ, high-grade, estrogen receptor 100% positive, progesterone receptor 5% positive, WITH strong staining intensity.  Her subsequent history is as detailed below.  INTERVAL HISTORY: Shelia Marks was evaluated in the breast clinic 06/13/2016 accompanied by her sister Shelia Marks, and by her friend Shelia Marks. Her case was also presented in the multidisciplinary breast cancer conference 05/30/2016.  At that time a preliminary plan was proposed: since the patient refused surgery after meeting with the surgeon, she was referred for neoadjuvant anti-estrogens.   REVIEW OF SYSTEMS: There were no specific symptoms leading to the original mammogram, which was routinely scheduled. The patient denies unusual headaches, visual changes, nausea, vomiting, stiff neck, dizziness, or gait imbalance. There has been no cough, phlegm production, or pleurisy, no chest pain or pressure, and no change  in bowel or bladder habits. The patient denies fever, rash, bleeding, unexplained fatigue or unexplained weight loss. Shelia Marks does admit to a history of gout and arthritis, which makes walking difficult. She uses a walker. Sometimes she is depressed. A detailed review of systems was otherwise entirely negative.  PAST MEDICAL HISTORY: Past Medical History  Diagnosis Date  . Hypothyroidism   . Cancer ([place])   . Hypertension   . Sleep related leg cramps   . Senile osteoporosis   . Malignant neoplasm of thyroid gland ([place])   . Nontoxic uninodular goiter   . Dizziness and giddiness   . Other symptoms involving cardiovascular system   . Alzheimer's disease   . Varicose veins of lower extremities with inflammation   . Disorder of bone and cartilage, unspecified   . Hyperlipidemia LDL goal < 100   . Unspecified disorder of lipoid metabolism   . Unspecified hereditary and idiopathic peripheral neuropathy   . Phlebitis and thrombophlebitis of unspecified site   . Other malaise and fatigue   . Stroke [facility])     PAST SURGICAL HISTORY: Past Surgical History  Procedure Laterality Date  . Abdominal hysterectomy      DR HUGES  . Knee surgery      right  . Colon surgery    . Thyroid surgery  July 20, 2011  . Parathyroid adenoma removed  1983  . Catract surgery  2008    FAMILY HISTORY Family History  Problem Relation Age of Onset  . Diabetes Brother   The patient's father died from liver problems in his 10s. The patient's mother died in her 58s from "natural causes". The patient had no brothers. She had 6 sisters, 2 of whom died from complications of diabetes and one from a ruptured aneurysm. There is no history  of breast or ovarian cancer in the family.   GYNECOLOGIC HISTORY:  No LMP recorded. Patient has had a hysterectomy.  Shelia Marks does not remember how she was when she had her first period. She never carried a child to term. He is status post hysterectomy and at least one ovary was  removed. She never took hormone replacement.   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     ADVANCED DIRECTIVES:  [name] Shelia Marks IS HER HEALTHCARE PART OF ATTORNEY. Shelia CAN BE REACHED AT [phone].   HEALTH MAINTENANCE: Social History  Substance Use Topics  . Smoking status: Never Smoker   . Smokeless tobacco: Never Used  . Alcohol Use: No     No Known Allergies  Current Outpatient Prescriptions  Medication Sig Dispense Refill  . allopurinol (ZYLOPRIM) 300 MG tablet TAKE 1 TABLET EVERY DAY 90 tablet 3  . amLODipine (NORVASC) 10 MG tablet TAKE 1 TABLET EVERY DAY 90 tablet 3  . aspirin EC 81 MG tablet Take 1 tablet (81 mg total) by mouth daily. 30 tablet 3  . atenolol (TENORMIN) 25 MG tablet TAKE 1 TABLET EVERY DAY 90 tablet 1  . atorvastatin (LIPITOR) 20 MG tablet TAKE 1 TABLET ONE TIME DAILY AT 6PM FOR CHOLESTEROL 90 tablet 3  . calcium carbonate (TUMS - DOSED IN MG ELEMENTAL CALCIUM) 500 MG chewable tablet Chew 1 tablet by mouth as needed for indigestion or heartburn.    . iron polysaccharides (NIFEREX) 150 MG capsule Take 1 capsule (150 mg total) by mouth every other day. 15 capsule 3  . levothyroxine (SYNTHROID, LEVOTHROID) 100 MCG tablet TAKE 1 TABLET EVERY DAY ON AN EMPTY STOMACH AND SEPARATED FROM OTHER MEDICATIONS 90 tablet 1  . lisinopril (PRINIVIL,ZESTRIL) 2.5 MG tablet TAKE ONE TABLET ONCE DAILY TO CONTROL BLOOD PRESSURE 90 tablet 3  . sertraline (ZOLOFT) 50 MG tablet TAKE 1 TABLET EVERY DAY  FOR  DEPRESSION 90 tablet 3   No current facility-administered medications for this visit.    OBJECTIVE: Older white woman using a walker  Filed Vitals:   06/13/16 1611  BP: 157/78  Pulse: 67  Temp: 98.8 F (37.1 C)  Resp: 16     Body mass index is 30.21 kg/(m^2).    ECOG FS:2 - Symptomatic, <50% confined to bed  Ocular: Sclerae unicteric, EOMs  intact Ear-nose-throat: Oropharynx clear and moist Lymphatic: No cervical or supraclavicular adenopathy Lungs no rales or rhonchi, good excursion bilaterally Heart regular rate and rhythm Abd soft, nontender, positive bowel sounds MSK no focal spinal tenderness, no joint edema, decreased range of motion right upper extremity due to shoulder discomfort  Neuro: non-focal, appropriate affect Breasts:  the right breast is unremarkable. In the upper outer quadrant of the left breast there is an easily palpable mass which measures a little over a centimeter. There is no overlying erythema. There is no tenderness to palpation. The left axilla is benign.    LAB RESULTS:  CMP     Component Value Date/Time   NA 143 06/01/2016 0958   NA 143 03/09/2016 1837   K 3.9 06/01/2016 0958   CL 102 06/01/2016 0958   CO2 26 06/01/2016 0958   GLUCOSE 78 06/01/2016 0958   GLUCOSE 112* 03/09/2016 1837   BUN 21 06/01/2016 0958   BUN 12 03/09/2016 1837   CREATININE 0.98 06/01/2016 0958   CREATININE 1.09 07/24/2011 1656  CALCIUM 9.8 06/01/2016 0958   PROT 6.8 11/09/2015 1013   PROT 6.4 03/09/2014 0530   ALBUMIN 4.1 11/09/2015 1013   ALBUMIN 2.9* 03/09/2014 0530   AST 18 11/09/2015 1013   ALT 13 11/09/2015 1013   ALKPHOS 83 11/09/2015 1013   BILITOT 0.5 11/09/2015 1013   BILITOT 0.4 03/09/2014 0530   GFRNONAA 52* 06/01/2016 0958   GFRAA 60 06/01/2016 0958    INo results found for: SPEP, UPEP  Lab Results  Component Value Date   WBC 3.8 06/01/2016   NEUTROABS 2.2 06/01/2016   HGB 11.8* 03/09/2016   HCT 37.1 06/01/2016   MCV 93 06/01/2016   PLT 193 06/01/2016      Chemistry      Component Value Date/Time   NA 143 06/01/2016 0958   NA 143 03/09/2016 1837   K 3.9 06/01/2016 0958   CL 102 06/01/2016 0958   CO2 26 06/01/2016 0958   BUN 21 06/01/2016 0958   BUN 12 03/09/2016 1837   CREATININE 0.98 06/01/2016 0958   CREATININE 1.09 07/24/2011 1656      Component Value Date/Time    CALCIUM 9.8 06/01/2016 0958   ALKPHOS 83 11/09/2015 1013   AST 18 11/09/2015 1013   ALT 13 11/09/2015 1013   BILITOT 0.5 11/09/2015 1013   BILITOT 0.4 03/09/2014 0530       No results found for: LABCA2  No components found for: LABCA125  No results for input(s): INR in the last 168 hours.  Urinalysis    Component Value Date/Time   COLORURINE STRAW* 03/09/2016 2001   APPEARANCEUR CLEAR 03/09/2016 2001   LABSPEC 1.006 03/09/2016 2001   PHURINE 7.5 03/09/2016 2001   GLUCOSEU NEGATIVE 03/09/2016 2001   HGBUR NEGATIVE 03/09/2016 2001   [place] NEGATIVE 03/09/2016 2001   [place] NEGATIVE 03/09/2016 2001   PROTEINUR NEGATIVE 03/09/2016 2001   UROBILINOGEN 0.2 03/05/2014 2055   NITRITE NEGATIVE 03/09/2016 2001   LEUKOCYTESUR NEGATIVE 03/09/2016 2001        STUDIES: Mm Digital Diagnostic Unilat L  05/21/2016  CLINICAL DATA:  Post biopsy mammogram of the left breast for clip placement. EXAM: DIAGNOSTIC LEFT MAMMOGRAM POST STEREOTACTIC BIOPSY COMPARISON:  Previous exam(s). FINDINGS: Mammographic images were obtained following stereotactic guided biopsy of calcifications in the upper-outer left breast. The coil shaped biopsy marking clip is appropriately positioned at the intended site of biopsy in the upper-outer quadrant of the left breast. IMPRESSION: Appropriate positioning of the coil shaped biopsy marking clip at the site of biopsied calcifications in the upper-outer left breast. Final Assessment: Post Procedure Mammograms for Marker Placement Electronically Signed   By: Ammie Ferrier M.D.   On: 05/21/2016 08:59   Mm Digital Diagnostic Unilat L  05/15/2016  CLINICAL DATA:  Screening recall for left breast calcifications. EXAM: DIGITAL DIAGNOSTIC LEFT MAMMOGRAM COMPARISON:  Previous exam(s). ACR Breast Density Category b: There are scattered areas of fibroglandular density. FINDINGS: Spot compression magnification views were performed over the upper-outer left breast.  There are suspicious calcifications varying in shape size and density spanning a distance of approximately 1.7 cm. IMPRESSION: Suspicious left breast calcifications. RECOMMENDATION: Stereotactic guided biopsy of the suspicious calcifications in the upper-outer left breast is recommended. This is scheduled for 05/21/2016 at 8 a.m. I have discussed the findings and recommendations with the patient. Results were also provided in writing at the conclusion of the visit. If applicable, a reminder letter will be sent to the patient regarding the next appointment. BI-RADS CATEGORY  5: Highly suggestive  of malignancy. Electronically Signed   By: Everlean Alstrom M.D.   On: 05/15/2016 12:42   Mm Lt Breast Bx W Loc Dev 1st Lesion Image Bx Spec Stereo Guide  05/22/2016  ADDENDUM REPORT: 05/22/2016 12:35 ADDENDUM: Pathology revealed HIGH GRADE DUCTAL CARCINOMA IN SITU WITH COMEDONECROSIS AND CALCIFICATIONS of the upper outer quadrant of the Left breast. This was found to be concordant by Dr. Ammie Ferrier. Pathology results were discussed with the patient and her sister, Shelia Marks, by telephone. The patient reported doing well after the biopsy with oozing at the site. Post biopsy instructions and care were reviewed and questions were answered. The patient was encouraged to call [facility] for any additional concerns. Surgical consultation has been arranged with Dr. Autumn Messing at [facility] on May 24, 2016. Pathology results reported by Terie Purser, RN on 05/22/2016. Electronically Signed   By: Ammie Ferrier M.D.   On: 05/22/2016 12:35  05/22/2016  CLINICAL DATA:  82 year old female presenting for stereotactic biopsy of left breast calcifications. EXAM: LEFT BREAST STEREOTACTIC CORE NEEDLE BIOPSY COMPARISON:  Previous exams. FINDINGS: The patient and I discussed the procedure of stereotactic-guided biopsy including benefits and alternatives. We discussed the high likelihood  of a successful procedure. We discussed the risks of the procedure including infection, bleeding, tissue injury, clip migration, and inadequate sampling. Informed written consent was given. The usual time out protocol was performed immediately prior to the procedure. Using sterile technique and 1% Lidocaine as local anesthetic, under stereotactic guidance, a 9 gauge vacuum assisted device was used to perform core needle biopsy of calcifications in the upper-outer quadrant of the left breast using a superior approach. Specimen radiograph was performed showing calcifications within multiple core samples. Specimens with calcifications are identified for pathology. At the conclusion of the procedure, a coil shaped tissue marker clip was deployed into the biopsy cavity. Follow-up 2-view mammogram was performed and dictated separately. IMPRESSION: Stereotactic-guided biopsy of calcifications in the upper-outer left breast. No apparent complications. Electronically Signed: By: Ammie Ferrier M.D. On: 05/21/2016 08:46    ELIGIBLE FOR AVAILABLE RESEARCH PROTOCOL: no  ASSESSMENT: 82 y.o. [place] woman status post left breast upper outer quadrant biopsy 05/21/2016 for ductal carcinoma in situ, high-grade, estrogen and progesterone receptor positive  (1)opted against surgery   (2)offered and anastrozole neoadjuvantly 06/13/2016   PLAN: We spent the better part of today's hour-long appointment discussing the biology of breast cancer in general, and the specifics of the patient's tumor in particular. The patient understands that in noninvasive ductal carcinoma, also called ductal carcinoma in situ ("DCIS") the breast cancer cells remain trapped in the ducts were they started. They cannot travel to a vital organ. For that reason these cancers in themselves are not life-threatening.  If the whole breast is removed then all the ducts are removed and since the cancer cells are trapped in the ducts, the cure rate  with mastectomy for noninvasive breast cancer is approximately 99%. Nevertheless we recommend lumpectomy, because there is no survival advantage to mastectomy and because the cosmetic result is generally superior with breast conservation.  If Carmon had a lumpectomy instead, there would be some risk of recurrence. The recurrence can only be in the same breast since, again, the cells are trapped in the ducts. The risk of local recurrence is cut by more than half with radiation, which is standard in this situation.  In estrogen receptor positive cancers like Nemiah's, anti-estrogens can also be considered. They will  further reduce the risk of recurrence by one half. In addition anti-estrogens will lower the risk of a new breast cancer developing in either breast also by one half. That risk approaches 1% per year. Anti-estrogens reduce it to 1/2%.  I explained to Trevi that I do not think surgery would be difficult for her. Could probably be done under local anesthesia. This would be her easiest choice, as it would be very easy to follow, with yearly mammography and ultrasonography and physical exam.  If she chooses and anastrozole instead, there are the possibilities of hot flashes, vaginal dryness, arthralgias and myalgias, and worsening osteopenia. Also we would have to monitor the cancer more frequently. If the cancer learn how to get around the anastrozole then she would still need surgery  I would be comfortable with her choosing surgery are anastrozole but I would not be comfortable with her not choosing 1 of these options. I would simply allow the cancer to grow, perhaps become invasive, and possibly cause her significant problems, even perhaps taking her life.   Rusty could not make up her mind today. I gave her all the information in writing. I went ahead and placed the anastrozole prescription and I asked her to call us in a week or so to let us know what her decision was. I made  her a return appointment in 3 months. At that time we should be able to assess by exam whether the cancer is responding or not.  Christineas a good understandinher options. She will call with any problems that may develop before her next visit here.  Chauncey Cruel, MD   06/13/2016 4:43 PM Medical Oncology and Hematology [facility] [address] [place], [place] [address] Tel. [phone]    Fax. [phone]

## 2016-06-14 ENCOUNTER — Telehealth: Payer: Self-pay | Admitting: Oncology

## 2016-06-14 NOTE — Telephone Encounter (Signed)
Spoke with relative to inform them of pt follow up appt 9/27 at 3 pm per GM 6/14 pof

## 2016-06-15 ENCOUNTER — Telehealth: Payer: Self-pay | Admitting: *Deleted

## 2016-06-15 NOTE — Telephone Encounter (Signed)
Left vm for pt to return call to give navigation resources. Contact information provided.

## 2016-06-28 ENCOUNTER — Telehealth: Payer: Self-pay | Admitting: *Deleted

## 2016-06-28 NOTE — Telephone Encounter (Signed)
SPoke with patient and she does not want to have surgery right now. She started taking the anastrozole last week.  She is aware of her follow up with Dr. Jana Hakim in September.

## 2016-07-11 ENCOUNTER — Other Ambulatory Visit: Payer: Self-pay | Admitting: *Deleted

## 2016-07-11 MED ORDER — ATENOLOL 25 MG PO TABS: 25.0000 mg | ORAL_TABLET | Freq: Every day | ORAL | Status: DC

## 2016-07-11 NOTE — Telephone Encounter (Signed)
Shelia Marks requested to be sent to local pharmacy.

## 2016-08-02 ENCOUNTER — Encounter: Payer: Self-pay | Admitting: Internal Medicine

## 2016-08-02 ENCOUNTER — Ambulatory Visit (INDEPENDENT_AMBULATORY_CARE_PROVIDER_SITE_OTHER): Payer: Medicare Other | Admitting: Internal Medicine

## 2016-08-02 VITALS — BP 138/70 | HR 67 | Temp 98.4°F | Ht 61.0 in | Wt 156.0 lb

## 2016-08-02 DIAGNOSIS — M810 Age-related osteoporosis without current pathological fracture: Secondary | ICD-10-CM

## 2016-08-02 DIAGNOSIS — F0631 Mood disorder due to known physiological condition with depressive features: Secondary | ICD-10-CM | POA: Diagnosis not present

## 2016-08-02 DIAGNOSIS — E890 Postprocedural hypothyroidism: Secondary | ICD-10-CM | POA: Diagnosis not present

## 2016-08-02 DIAGNOSIS — I639 Cerebral infarction, unspecified: Secondary | ICD-10-CM

## 2016-08-02 DIAGNOSIS — I10 Essential (primary) hypertension: Secondary | ICD-10-CM | POA: Diagnosis not present

## 2016-08-02 DIAGNOSIS — IMO0002 Reserved for concepts with insufficient information to code with codable children: Secondary | ICD-10-CM

## 2016-08-02 DIAGNOSIS — F0150 Vascular dementia without behavioral disturbance: Secondary | ICD-10-CM | POA: Diagnosis not present

## 2016-08-02 DIAGNOSIS — N644 Mastodynia: Secondary | ICD-10-CM

## 2016-08-02 DIAGNOSIS — D0512 Intraductal carcinoma in situ of left breast: Secondary | ICD-10-CM | POA: Diagnosis not present

## 2016-08-02 NOTE — Progress Notes (Signed)
Location:  [facility] clinic Provider:  Roby Donaway L. Mariea Clonts, D.O., C.M.D.  Code Status: DNR Goals of Care:  Advanced Directives 08/02/2016  Does patient have an advance directive? No  Type of Advance Directive -  Does patient want to make changes to advanced directive? -  Copy of advanced directive(s) in chart? -  Would patient like information on creating an advanced directive? -  Pre-existing out of facility DNR order (yellow form or pink MOST form) -     Chief Complaint  Patient presents with  . Medical Management of Chronic Issues    follow-up    HPI: Patient is a 82 y.o. female seen today for medical management of chronic diseases.    Walking slowly, but walking.  Does not like using her walker and uses her cane, but unsteady with it.  Discussed importance of walker.    BP stable today.  No dizziness, lightheadedness.    Has dark stools, but on iron.    For her breast cancer, she is now on anastrozole.  Stings where she had her biopsy.  Down 4 more lbs in about 2 mos.    Depression: says her mood is fine.  Still on her zoloft.  Notes she is gaining weight.  Her sister reports she can tell when she hasn't taken it.    No gout flares.  On allopurinol.  Past Medical History:  Diagnosis Date  . Alzheimer's disease   . Cancer ([place])   . Disorder of bone and cartilage, unspecified   . Dizziness and giddiness   . Hyperlipidemia LDL goal < 100   . Hypertension   . Hypothyroidism   . Malignant neoplasm of thyroid gland ([place])   . Nontoxic uninodular goiter   . Other malaise and fatigue   . Other symptoms involving cardiovascular system   . Phlebitis and thrombophlebitis of unspecified site   . Senile osteoporosis   . Sleep related leg cramps   . Stroke ([place])   . Unspecified disorder of lipoid metabolism   . Unspecified hereditary and idiopathic peripheral neuropathy   . Varicose veins of lower extremities with inflammation     Past Surgical History:  Procedure Laterality  Date  . ABDOMINAL HYSTERECTOMY     DR HUGES  . CATRACT SURGERY  2008  . COLON SURGERY    . KNEE SURGERY     right  . PARATHYROID ADENOMA REMOVED  1983  . THYROID SURGERY  July 20, 2011    No Known Allergies    Medication List       Accurate as of 08/02/16 10:58 AM. Always use your most recent med list.          allopurinol 300 MG tablet Commonly known as:  ZYLOPRIM TAKE 1 TABLET EVERY DAY   amLODipine 10 MG tablet Commonly known as:  NORVASC TAKE 1 TABLET EVERY DAY   anastrozole 1 MG tablet Commonly known as:  ARIMIDEX Take 1 tablet (1 mg total) by mouth daily.   aspirin EC 81 MG tablet Take 1 tablet (81 mg total) by mouth daily.   atenolol 25 MG tablet Commonly known as:  TENORMIN Take 1 tablet (25 mg total) by mouth daily.   atorvastatin 20 MG tablet Commonly known as:  LIPITOR TAKE 1 TABLET ONE TIME DAILY AT 6PM FOR CHOLESTEROL   calcium carbonate 500 MG chewable tablet Commonly known as:  TUMS - dosed in mg elemental calcium Chew 1 tablet by mouth as needed for indigestion or heartburn.  iron polysaccharides 150 MG capsule Commonly known as:  NIFEREX Take 1 capsule (150 mg total) by mouth every other day.   levothyroxine 100 MCG tablet Commonly known as:  SYNTHROID, LEVOTHROID TAKE 1 TABLET EVERY DAY ON AN EMPTY STOMACH AND SEPARATED FROM OTHER MEDICATIONS   lisinopril 2.5 MG tablet Commonly known as:  PRINIVIL,ZESTRIL TAKE ONE TABLET ONCE DAILY TO CONTROL BLOOD PRESSURE   sertraline 50 MG tablet Commonly known as:  ZOLOFT TAKE 1 TABLET EVERY DAY  FOR  DEPRESSION       Review of Systems:  Review of Systems  Constitutional: Positive for weight loss. Negative for chills and fever.  HENT: Positive for hearing loss.   Eyes: Positive for blurred vision.  Respiratory: Negative for cough and shortness of breath.   Cardiovascular: Negative for chest pain, palpitations and leg swelling.       Does have a stinging pain through her left breast at  biopsy site  Gastrointestinal: Negative for abdominal pain, blood in stool and constipation.       Dark stools  Genitourinary: Positive for frequency. Negative for dysuria, hematuria and urgency.  Musculoskeletal: Positive for falls.       Unsteady gait with cane--again counseled on importance of using walker  Skin: Negative for rash.  Neurological: Negative for dizziness, loss of consciousness and weakness.  Endo/Heme/Allergies: Does not bruise/bleed easily.  Psychiatric/Behavioral: Positive for depression and memory loss.    Health Maintenance  Topic Date Due  . TETANUS/TDAP  12/08/1947  . ZOSTAVAX  12/07/1988  . INFLUENZA VACCINE  07/31/2016  . DEXA SCAN  Completed  . PNA vac Low Risk Adult  Completed    Physical Exam: Vitals:   08/02/16 1034  BP: 138/70  Pulse: 67  Temp: 98.4 F (36.9 C)  TempSrc: Oral  SpO2: 96%  Weight: 156 lb (70.8 kg)  Height: 5\' 1"  (1.549 m)   Body mass index is 29.48 kg/m. Physical Exam  Constitutional: She appears well-developed and well-nourished.  Cardiovascular: Normal rate, regular rhythm, normal heart sounds and intact distal pulses.   Pulmonary/Chest: Effort normal and breath sounds normal. No respiratory distress.  Left breast tender at biopsy site--area there remains hard, no additional palpable masses  Abdominal: Soft. Bowel sounds are normal.  Musculoskeletal:  Right leg weakness s/p stroke--walks unsteadily with cane  Neurological: She is alert. Coordination normal.  Oriented to person and place, not time  Skin: Skin is warm and dry. Capillary refill takes less than 2 seconds.  Psychiatric: She has a normal mood and affect.   Labs reviewed: Basic Metabolic Panel:  Recent Labs  11/09/15 1013 02/02/16 1128 03/09/16 1837 06/01/16 0958  NA 142 143 143 143  K 3.8 4.0 3.3* 3.9  CL 103 103 108 102  CO2 25 24 24 26   GLUCOSE 79 76 112* 78  BUN 16 16 12 21   CREATININE 0.87 0.86 0.88 0.98  CALCIUM 9.6 9.9 9.8 9.8  TSH 2.720  2.750  --  1.850   Liver Function Tests:  Recent Labs  11/09/15 1013  AST 18  ALT 13  ALKPHOS 83  BILITOT 0.5  PROT 6.8  ALBUMIN 4.1   No results for input(s): LIPASE, AMYLASE in the last 8760 hours. No results for input(s): AMMONIA in the last 8760 hours. CBC:  Recent Labs  02/02/16 1128 03/09/16 1837 06/01/16 0958  WBC 4.0 5.6 3.8  NEUTROABS 2.2 3.1 2.2  HGB  --  11.8*  --   HCT 37.4 37.1 37.1  MCV  90 90.0 93  PLT 189 179 193   Lipid Panel:  Recent Labs  11/09/15 1013  CHOL 166  HDL 60  LDLCALC 90  TRIG 81  CHOLHDL 2.8   Lab Results  Component Value Date   HGBA1C 5.0 11/09/2015    Procedures since last visit: No results found.  Assessment/Plan 1. Breast pain, left -? If nerve was hit during needle biopsy based on stinging shooting pain occasionally--not regular and she does not ask for treatment  2. Ductal carcinoma in situ (DCIS) of left breast -cont anastrazole for 5 year course -has lost 4 more lbs since 06/13/16  3. Depression due to stroke [facility]) -doing better with regular zoloft, cont same  4. Vascular dementia, without behavioral disturbance -secondary stroke prevention -cont baby asa, statin   5. Hypothyroidism, postsurgical -stable, cont sythroid  6. Essential hypertension, benign -bp satisfactory, no dizziness, cont same regimen and monitor  7. Senile osteoporosis - again discussed importance of getting bone density--hasn't had in over 5 years and prior showed osteoporosis - mysteriously, she is only on calcium (tums prn) and no regular Vitamin D3 or calcium--needs to be and also to walk more with her walker - DG Bone Density; Future  Labs/tests ordered:  Orders Placed This Encounter  Procedures  . DG Bone Density    Standing Status:   Future    Standing Expiration Date:   10/02/2017    Order Specific Question:   Reason for Exam (SYMPTOM  OR DIAGNOSIS REQUIRED)    Answer:   osteoporosis f/u    Order Specific Question:    Preferred imaging location?    Answer:   [facility]    Next appt:  11/05/2016 med mgt, come fasting for same day labs   Ginna Schuur L. Brendalyn Vallely, D.O. [facility] [address], [place] [address] Cell Phone (Mon-Fri 8am-5pm):  [phone] On Call:  [phone] & follow prompts after 5pm & weekends Office Phone:  [phone] Office Fax:  [phone]

## 2016-09-26 ENCOUNTER — Ambulatory Visit (HOSPITAL_BASED_OUTPATIENT_CLINIC_OR_DEPARTMENT_OTHER): Payer: Medicare Other | Admitting: Oncology

## 2016-09-26 VITALS — BP 155/70 | HR 69 | Temp 98.5°F | Resp 18 | Ht 61.0 in | Wt 160.2 lb

## 2016-09-26 DIAGNOSIS — Z79811 Long term (current) use of aromatase inhibitors: Secondary | ICD-10-CM | POA: Diagnosis not present

## 2016-09-26 DIAGNOSIS — D0512 Intraductal carcinoma in situ of left breast: Secondary | ICD-10-CM

## 2016-09-26 DIAGNOSIS — Z170 Estrogen receptor positive status [ER+]: Secondary | ICD-10-CM | POA: Diagnosis not present

## 2016-09-26 NOTE — Progress Notes (Signed)
Shelia Marks  [phone] [phone]     ID: Shelia Marks DOB: 03/14/1926  MR#: [id number]  ID:[id number]  Patient Care Team: Shelia Curry, DO as PCP - General (Geriatric Medicine) Shelia Guys, MD as Consulting Physician (Ophthalmology) Shelia Cruel, MD as Consulting Physician (Oncology) Shelia Messing III, MD as Consulting Physician (General Surgery) Shelia Pi, MD as Consulting Physician (Endocrinology) PCP: Shelia Kinnier, DO GYN: OTHER MD:  CHIEF COMPLAINT: Ductal carcinoma in situ  CURRENT TREATMENT: Anastrozole    BREAST CANCER HISTORY: From the original intake note:  "Shelia Marks" had bilateral screening mammography at [facility] 06/07/2016 showing calcifications in the left breast. She was recalled  05/15/2016 for left diagnostic mammography. This showed the breast density to be category be. In the upper outer left breast there was an area of suspicious calcifications measuring 1.7 cm.  Biopsy of this area was obtained 05/21/2016, and showed ([id number]) ductal carcinoma in situ, high-grade, estrogen receptor 100% positive, progesterone receptor 5% positive, WITH strong staining intensity.  Her subsequent history is as detailed below.  INTERVAL HISTORY: Shelia Marks returns today for follow-up of her breast cancer accompanied by her sister Shelia Marks. Since her last visit here Shelia Marks made a definitive decision against surgery and started anastrozole. She has been on it since mid June. She has had some hot flashes and her hair is thinning a little she tells me. Otherwise she tolerates it well. She obtains it at approximately $5 per month  REVIEW OF SYSTEMS: She has decreased mobility because of her stroke. She denies pain. There have been no unusual headaches, visual changes, nausea, or vomiting. She does say she needs new glasses. A detailed review of systems today was otherwise stable.  PAST MEDICAL HISTORY: Past  Medical History:  Diagnosis Date  . Alzheimer's disease   . Cancer ([place])   . Disorder of bone and cartilage, unspecified   . Dizziness and giddiness   . Hyperlipidemia LDL goal < 100   . Hypertension   . Hypothyroidism   . Malignant neoplasm of thyroid gland ([place])   . Nontoxic uninodular goiter   . Other malaise and fatigue   . Other symptoms involving cardiovascular system   . Phlebitis and thrombophlebitis of unspecified site   . Senile osteoporosis   . Sleep related leg cramps   . Stroke ([place])   . Unspecified disorder of lipoid metabolism   . Unspecified hereditary and idiopathic peripheral neuropathy   . Varicose veins of lower extremities with inflammation     PAST SURGICAL HISTORY: Past Surgical History:  Procedure Laterality Date  . ABDOMINAL HYSTERECTOMY     DR HUGES  . CATRACT SURGERY  2008  . COLON SURGERY    . KNEE SURGERY     right  . PARATHYROID ADENOMA REMOVED  1983  . THYROID SURGERY  July 20, 2011    FAMILY HISTORY Family History  Problem Relation Age of Onset  . Diabetes Brother   The patient's father died from liver problems in his 108s. The patient's mother died in her 72s from "natural causes". The patient had no brothers. She had 6 sisters, 2 of whom died from complications of diabetes and one from a ruptured aneurysm. There is no history of breast or ovarian cancer in the family.   GYNECOLOGIC HISTORY:  No LMP recorded. Patient has had a hysterectomy.  Shelia Marks does not remember how she was when she had her first period. She never carried a child to term. He  is status post hysterectomy and at least one ovary was removed. She never took hormone replacement.   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     ADVANCED DIRECTIVES:  [name] Shelia Marks IS HER HEALTHCARE PART OF ATTORNEY. Shelia CAN BE REACHED AT [phone].   HEALTH  MAINTENANCE: Social History  Substance Use Topics  . Smoking status: Never Smoker  . Smokeless tobacco: Never Used  . Alcohol use No     No Known Allergies  Current Outpatient Prescriptions  Medication Sig Dispense Refill  . allopurinol (ZYLOPRIM) 300 MG tablet TAKE 1 TABLET EVERY DAY 90 tablet 3  . amLODipine (NORVASC) 10 MG tablet TAKE 1 TABLET EVERY DAY 90 tablet 3  . anastrozole (ARIMIDEX) 1 MG tablet Take 1 tablet (1 mg total) by mouth daily. 90 tablet 4  . aspirin EC 81 MG tablet Take 1 tablet (81 mg total) by mouth daily. 30 tablet 3  . atenolol (TENORMIN) 25 MG tablet Take 1 tablet (25 mg total) by mouth daily. 90 tablet 1  . atorvastatin (LIPITOR) 20 MG tablet TAKE 1 TABLET ONE TIME DAILY AT 6PM FOR CHOLESTEROL 90 tablet 3  . calcium carbonate (TUMS - DOSED IN MG ELEMENTAL CALCIUM) 500 MG chewable tablet Chew 1 tablet by mouth as needed for indigestion or heartburn.    . iron polysaccharides (NIFEREX) 150 MG capsule Take 1 capsule (150 mg total) by mouth every other day. 15 capsule 3  . levothyroxine (SYNTHROID, LEVOTHROID) 100 MCG tablet TAKE 1 TABLET EVERY DAY ON AN EMPTY STOMACH AND SEPARATED FROM OTHER MEDICATIONS 90 tablet 1  . lisinopril (PRINIVIL,ZESTRIL) 2.5 MG tablet TAKE ONE TABLET ONCE DAILY TO CONTROL BLOOD PRESSURE 90 tablet 3  . sertraline (ZOLOFT) 50 MG tablet TAKE 1 TABLET EVERY DAY  FOR  DEPRESSION 90 tablet 3   No current facility-administered medications for this visit.     OBJECTIVE: Older white woman Who appears stated age 82:   09/26/16 1456  BP: (!) 155/70  Pulse: 69  Resp: 18  Temp: 98.5 F (36.9 C)     Body mass index is 30.27 kg/m.    ECOG FS:2 - Symptomatic, <50% confined to bed  Sclerae unicteric, bilateral arcus senilis Oropharynx clear and moist--full upper plate  No cervical or supraclavicular adenopathy Lungs no rales or rhonchi Heart regular rate and rhythm Abd soft, nontender, positive bowel sounds MSK no focal spinal  tenderness, no upper extremity lymphedema Neuro: LL weakness secondary to strokeE  well oriented, appropriate affect Breasts: no masses palpated in either breast, both axillae are benign.   LAB RESULTS:  CMP     Component Value Date/Time   NA 143 06/01/2016 0958   K 3.9 06/01/2016 0958   CL 102 06/01/2016 0958   CO2 26 06/01/2016 0958   GLUCOSE 78 06/01/2016 0958   GLUCOSE 112 (H) 03/09/2016 1837   BUN 21 06/01/2016 0958   CREATININE 0.98 06/01/2016 0958   CREATININE 1.09 07/24/2011 1656   CALCIUM 9.8 06/01/2016 0958   PROT 6.8 11/09/2015 1013   ALBUMIN 4.1 11/09/2015 1013   AST 18 11/09/2015 1013   ALT 13 11/09/2015 1013   ALKPHOS 83 11/09/2015 1013   BILITOT 0.5 11/09/2015 1013   GFRNONAA 52 (L) 06/01/2016 0958   GFRAA 60 06/01/2016 0958    INo results found for: SPEP, UPEP  Lab Results  Component Value Date  WBC 3.8 06/01/2016   NEUTROABS 2.2 06/01/2016   HGB 11.8 (L) 03/09/2016   HCT 37.1 06/01/2016   MCV 93 06/01/2016   PLT 193 06/01/2016      Chemistry      Component Value Date/Time   NA 143 06/01/2016 0958   K 3.9 06/01/2016 0958   CL 102 06/01/2016 0958   CO2 26 06/01/2016 0958   BUN 21 06/01/2016 0958   CREATININE 0.98 06/01/2016 0958   CREATININE 1.09 07/24/2011 1656      Component Value Date/Time   CALCIUM 9.8 06/01/2016 0958   ALKPHOS 83 11/09/2015 1013   AST 18 11/09/2015 1013   ALT 13 11/09/2015 1013   BILITOT 0.5 11/09/2015 1013       No results found for: LABCA2  No components found for: LABCA125  No results for input(s): INR in the last 168 hours.  Urinalysis    Component Value Date/Time   COLORURINE STRAW (A) 03/09/2016 2001   APPEARANCEUR CLEAR 03/09/2016 2001   LABSPEC 1.006 03/09/2016 2001   PHURINE 7.5 03/09/2016 2001   GLUCOSEU NEGATIVE 03/09/2016 2001   HGBUR NEGATIVE 03/09/2016 2001   [place] NEGATIVE 03/09/2016 2001   [place] 03/09/2016 2001   PROTEINUR NEGATIVE 03/09/2016 2001   UROBILINOGEN  0.2 03/05/2014 2055   NITRITE NEGATIVE 03/09/2016 2001   LEUKOCYTESUR NEGATIVE 03/09/2016 2001        STUDIES: No results found.  ELIGIBLE FOR AVAILABLE RESEARCH PROTOCOL: no  ASSESSMENT: 82 y.o. [place] woman status post left breast upper outer quadrant biopsy 05/21/2016 for ductal carcinoma in situ, high-grade, estrogen and progesterone receptor positive  (1) opted against surgery   (2) started anastrozole neoadjuvantly 06/13/2016   PLAN:  Ms. Wagster is tolerating the anastrozole well, and the plan will be to continue that for a total of 5 years. She is concerned about weight gain. This is an issue because she cannot exercise given her stroke. I suggested she taste E but small servings.  She will see me again in 6 months. If all is going well at that time I will start seeing her once a year.  She knows to call for any problems that may develop before her next visit here.   Shelia Cruel, MD   09/26/2016 3:31 PM Medical Oncology and Hematology [facility] [address] [place], [place] [address] Tel. [phone]    Fax. [phone]

## 2016-10-02 ENCOUNTER — Other Ambulatory Visit: Payer: Self-pay | Admitting: *Deleted

## 2016-10-02 MED ORDER — SERTRALINE HCL 50 MG PO TABS: ORAL_TABLET | ORAL | 3 refills | Status: DC

## 2016-10-02 MED ORDER — ALLOPURINOL 300 MG PO TABS: 300.0000 mg | ORAL_TABLET | Freq: Every day | ORAL | 3 refills | Status: DC

## 2016-10-02 MED ORDER — AMLODIPINE BESYLATE 10 MG PO TABS: 10.0000 mg | ORAL_TABLET | Freq: Every day | ORAL | 3 refills | Status: DC

## 2016-10-02 MED ORDER — ATORVASTATIN CALCIUM 20 MG PO TABS: ORAL_TABLET | ORAL | 3 refills | Status: DC

## 2016-10-02 MED ORDER — LISINOPRIL 2.5 MG PO TABS: ORAL_TABLET | ORAL | 3 refills | Status: DC

## 2016-10-02 NOTE — Telephone Encounter (Signed)
Horris Latino, caregiver requested medications to be faxed to [facility].

## 2016-10-05 DIAGNOSIS — Z23 Encounter for immunization: Secondary | ICD-10-CM | POA: Diagnosis not present

## 2016-10-17 ENCOUNTER — Other Ambulatory Visit: Payer: Self-pay | Admitting: Internal Medicine

## 2016-11-05 ENCOUNTER — Ambulatory Visit (INDEPENDENT_AMBULATORY_CARE_PROVIDER_SITE_OTHER): Payer: Medicare Other | Admitting: Internal Medicine

## 2016-11-05 ENCOUNTER — Encounter: Payer: Self-pay | Admitting: Internal Medicine

## 2016-11-05 VITALS — BP 150/80 | HR 61 | Temp 98.3°F | Wt 157.0 lb

## 2016-11-05 DIAGNOSIS — I69993 Ataxia following unspecified cerebrovascular disease: Secondary | ICD-10-CM | POA: Diagnosis not present

## 2016-11-05 DIAGNOSIS — E890 Postprocedural hypothyroidism: Secondary | ICD-10-CM

## 2016-11-05 DIAGNOSIS — F0150 Vascular dementia without behavioral disturbance: Secondary | ICD-10-CM | POA: Diagnosis not present

## 2016-11-05 DIAGNOSIS — M810 Age-related osteoporosis without current pathological fracture: Secondary | ICD-10-CM

## 2016-11-05 DIAGNOSIS — IMO0002 Reserved for concepts with insufficient information to code with codable children: Secondary | ICD-10-CM

## 2016-11-05 DIAGNOSIS — I639 Cerebral infarction, unspecified: Secondary | ICD-10-CM | POA: Diagnosis not present

## 2016-11-05 DIAGNOSIS — F0631 Mood disorder due to known physiological condition with depressive features: Secondary | ICD-10-CM | POA: Diagnosis not present

## 2016-11-05 DIAGNOSIS — M1732 Unilateral post-traumatic osteoarthritis, left knee: Secondary | ICD-10-CM | POA: Diagnosis not present

## 2016-11-05 DIAGNOSIS — K5901 Slow transit constipation: Secondary | ICD-10-CM | POA: Diagnosis not present

## 2016-11-05 DIAGNOSIS — D0512 Intraductal carcinoma in situ of left breast: Secondary | ICD-10-CM

## 2016-11-05 DIAGNOSIS — M1712 Unilateral primary osteoarthritis, left knee: Secondary | ICD-10-CM | POA: Insufficient documentation

## 2016-11-05 LAB — COMPLETE METABOLIC PANEL WITH GFR

## 2016-11-05 NOTE — Patient Instructions (Signed)
Increase your fiber and water intake. If you are still having constipation, try metamucil or benefiber--they have flavors now so it won't be as bad.

## 2016-11-05 NOTE — Progress Notes (Signed)
Location:  [facility] clinic Provider:  Karsynn Deweese L. Mariea Marks, D.O., C.M.D.  Code Status: DNR Goals of Care:  Advanced Directives 09/26/2016  Does patient have an advance directive? Yes  Type of Advance Directive -  Does patient want to make changes to advanced directive? -  Copy of advanced directive(s) in chart? -  Would patient like information on creating an advanced directive? -  Pre-existing out of facility DNR order (yellow form or pink MOST form) -     Chief Complaint  Patient presents with  . Medical Management of Chronic Issues    3 mth follow-up    HPI: Patient is a 82 y.o. female seen today for medical management of chronic diseases.    Breast cancer:  She continues on anastrazole and declined surgical treatment.  She saw Dr. Jana Marks in f/u 09/26/16 and reported some hot flashes and thinning hair, but no other side effects.  He wanted to see her again in 6 mos in March.    BP up at upper limits at 150.  Is anxious and hungry.    Hypothyroidism:  Last thyroid was checked in summer, due today.  Ataxia:  Using rolling walker.    Constipation:  Says sometimes she goes a week w/o a bm.  Sometimes hard, sometimes soft.  Uses MOM which seems to work.  Left knee painful at times.  Has no cartilage on that side.  Not a surgical candidate.  Uses her walker for balance.  Feels like it rubs together.  Is relieved some with topical rubs otc and her sister encourages her to take tylenol and I did, as well.  Past Medical History:  Diagnosis Date  . Alzheimer's disease   . Cancer ([place])   . Disorder of bone and cartilage, unspecified   . Dizziness and giddiness   . Hyperlipidemia LDL goal < 100   . Hypertension   . Hypothyroidism   . Malignant neoplasm of thyroid gland ([place])   . Nontoxic uninodular goiter   . Other malaise and fatigue   . Other symptoms involving cardiovascular system   . Phlebitis and thrombophlebitis of unspecified site   . Senile osteoporosis   . Sleep related  leg cramps   . Stroke ([place])   . Unspecified disorder of lipoid metabolism   . Unspecified hereditary and idiopathic peripheral neuropathy   . Varicose veins of lower extremities with inflammation     Past Surgical History:  Procedure Laterality Date  . ABDOMINAL HYSTERECTOMY     DR HUGES  . CATRACT SURGERY  2008  . COLON SURGERY    . KNEE SURGERY     right  . PARATHYROID ADENOMA REMOVED  1983  . THYROID SURGERY  July 20, 2011    No Known Allergies    Medication List       Accurate as of 11/05/16 10:16 AM. Always use your most recent med list.          allopurinol 300 MG tablet Commonly known as:  ZYLOPRIM Take 1 tablet (300 mg total) by mouth daily.   amLODipine 10 MG tablet Commonly known as:  NORVASC Take 1 tablet (10 mg total) by mouth daily.   anastrozole 1 MG tablet Commonly known as:  ARIMIDEX Take 1 tablet (1 mg total) by mouth daily.   aspirin EC 81 MG tablet Take 1 tablet (81 mg total) by mouth daily.   atenolol 25 MG tablet Commonly known as:  TENORMIN Take 1 tablet (25 mg total) by mouth  daily.   atorvastatin 20 MG tablet Commonly known as:  LIPITOR Take one tablet by mouth once daily for cholesterol   calcium carbonate 500 MG chewable tablet Commonly known as:  TUMS - dosed in mg elemental calcium Chew 1 tablet by mouth as needed for indigestion or heartburn.   iron polysaccharides 150 MG capsule Commonly known as:  NIFEREX Take 1 capsule (150 mg total) by mouth every other day.   levothyroxine 100 MCG tablet Commonly known as:  SYNTHROID, LEVOTHROID TAKE 1 TABLET EVERY DAY ON AN EMPTY STOMACH AND SEPARATED FROM OTHER MEDICATIONS   lisinopril 2.5 MG tablet Commonly known as:  PRINIVIL,ZESTRIL Take one tablet by mouth once daily to control blood pressure   sertraline 50 MG tablet Commonly known as:  ZOLOFT Take one tablet by mouth once daily for depression       Review of Systems:  Review of Systems  Constitutional: Positive for  malaise/fatigue. Negative for chills and fever.  HENT: Positive for hearing loss.   Eyes: Negative for blurred vision.       Glasses  Respiratory: Negative for shortness of breath.   Cardiovascular: Negative for chest pain and palpitations.  Gastrointestinal: Positive for constipation. Negative for abdominal pain, blood in stool, diarrhea and melena.  Genitourinary: Positive for frequency. Negative for dysuria and urgency.       In am  Musculoskeletal: Positive for joint pain. Negative for falls.  Skin: Negative for itching and rash.  Neurological: Positive for focal weakness and weakness. Negative for dizziness and loss of consciousness.  Psychiatric/Behavioral: Positive for memory loss. Negative for depression. The patient has insomnia.        Sleep is off and on, people on her street are loud fixing cars and phone rings she reports    Health Maintenance  Topic Date Due  . TETANUS/TDAP  12/08/1945  . ZOSTAVAX  12/07/1988  . INFLUENZA VACCINE  07/31/2016  . DEXA SCAN  Completed  . PNA vac Low Risk Adult  Completed    Physical Exam: Vitals:   11/05/16 1008  BP: (!) 150/80  Pulse: 61  Temp: 98.3 F (36.8 C)  TempSrc: Oral  SpO2: 98%  Weight: 157 lb (71.2 kg)   Body mass index is 29.66 kg/m. Physical Exam  Constitutional: She appears well-developed and well-nourished. No distress.  HENT:  Head: Normocephalic and atraumatic.  Eyes: EOM are normal. Pupils are equal, round, and reactive to light.  glasses  Cardiovascular: Normal rate, regular rhythm, normal heart sounds and intact distal pulses.   Pulmonary/Chest: Effort normal and breath sounds normal. No respiratory distress.  Abdominal: Soft. Bowel sounds are normal.  Musculoskeletal: Normal range of motion.  Neurological: She is alert. No cranial nerve deficit.  Oriented to person, place, not time  Skin: Skin is warm and dry. Capillary refill takes less than 2 seconds.  Hair is thinning  Psychiatric: She has a  normal mood and affect.    Labs reviewed: Basic Metabolic Panel:  Recent Labs  11/09/15 1013 02/02/16 1128 03/09/16 1837 06/01/16 0958  NA 142 143 143 143  K 3.8 4.0 3.3* 3.9  CL 103 103 108 102  CO2 25 24 24 26   GLUCOSE 79 76 112* 78  BUN 16 16 12 21   CREATININE 0.87 0.86 0.88 0.98  CALCIUM 9.6 9.9 9.8 9.8  TSH 2.720 2.750  --  1.850   Liver Function Tests:  Recent Labs  11/09/15 1013  AST 18  ALT 13  ALKPHOS 83  BILITOT  0.5  PROT 6.8  ALBUMIN 4.1   No results for input(s): LIPASE, AMYLASE in the last 8760 hours. No results for input(s): AMMONIA in the last 8760 hours. CBC:  Recent Labs  02/02/16 1128 03/09/16 1837 06/01/16 0958  WBC 4.0 5.6 3.8  NEUTROABS 2.2 3.1 2.2  HGB  --  11.8*  --   HCT 37.4 37.1 37.1  MCV 90 90.0 93  PLT 189 179 193   Lipid Panel:  Recent Labs  11/09/15 1013  CHOL 166  HDL 60  LDLCALC 90  TRIG 81  CHOLHDL 2.8   Lab Results  Component Value Date   HGBA1C 5.0 11/09/2015   Assessment/Plan 1. Ataxia, late effect of cerebrovascular disease - ongoing, using walker, no recent falls - Lipid panel  2. Vascular dementia without behavioral disturbance -mentation better today than usual and she was quite talkative and pleasant today--not on meds for this  3. Depression due to stroke ([place]) -Cont zoloft which she has tolerated well - Lipid panel  4. Post-traumatic osteoarthritis of left knee -continue topicals and encouraged use of tylenol on bad days  5. Ductal carcinoma in situ (DCIS) of left breast - cont to follow with oncology and cont anastazole - CBC with Differential/Platelet - COMPLETE METABOLIC PANEL WITH GFR  6. Senile osteoporosis -cont calcium and oddly she is off the vitamin D again which I keep recommending each visit -she also has not had her bone density (had her phone off the hook it sounds like so didn't answer when breast center called)--I've asked her sister, [name], to call and set it up for  her  7. Constipation, slow transit -counseled on fiber and fluids to help with this -recommended fiber supplement like miralax or benefiber  8. Hypothyroidism, postsurgical - cont current levothyroxine 168mcg daily - TSH  Labs/tests ordered:   Orders Placed This Encounter  Procedures  . TSH  . CBC with Differential/Platelet  . COMPLETE METABOLIC PANEL WITH GFR    [facility]  . Lipid panel    Order Specific Question:   Has the patient fasted?    Answer:   Yes    Next appt:  3 mos med mgt  Shelia Marks L. Rumaldo Difatta, D.O. [facility] [address], [place] [address] Cell Phone (Mon-Fri 8am-5pm):  [phone] On Call:  [phone] & follow prompts after 5pm & weekends Office Phone:  [phone] Office Fax:  [phone]

## 2016-11-05 NOTE — Addendum Note (Signed)
Addended by: Despina Hidden on: 11/05/2016 11:21 AM   Modules accepted: Orders

## 2016-11-07 ENCOUNTER — Other Ambulatory Visit: Payer: Medicare Other

## 2016-11-07 DIAGNOSIS — E890 Postprocedural hypothyroidism: Secondary | ICD-10-CM

## 2016-11-07 DIAGNOSIS — I639 Cerebral infarction, unspecified: Secondary | ICD-10-CM | POA: Diagnosis not present

## 2016-11-07 DIAGNOSIS — D0512 Intraductal carcinoma in situ of left breast: Secondary | ICD-10-CM | POA: Diagnosis not present

## 2016-11-07 DIAGNOSIS — I69993 Ataxia following unspecified cerebrovascular disease: Secondary | ICD-10-CM | POA: Diagnosis not present

## 2016-11-07 DIAGNOSIS — F0631 Mood disorder due to known physiological condition with depressive features: Secondary | ICD-10-CM | POA: Diagnosis not present

## 2016-11-07 DIAGNOSIS — F0150 Vascular dementia without behavioral disturbance: Secondary | ICD-10-CM

## 2016-11-07 DIAGNOSIS — M810 Age-related osteoporosis without current pathological fracture: Secondary | ICD-10-CM | POA: Diagnosis not present

## 2016-11-07 DIAGNOSIS — IMO0002 Reserved for concepts with insufficient information to code with codable children: Secondary | ICD-10-CM

## 2016-11-07 LAB — CBC WITH DIFFERENTIAL/PLATELET
Basophils Absolute: 43 cells/uL (ref 0–200)
Basophils Relative: 1 %
Eosinophils Absolute: 129 cells/uL (ref 15–500)
Eosinophils Relative: 3 %
HCT: 37.5 % (ref 35.0–45.0)
Hemoglobin: 11.8 g/dL (ref 11.7–15.5)
Lymphocytes Relative: 27 %
Lymphs Abs: 1161 cells/uL (ref 850–3900)
MCH: 29 pg (ref 27.0–33.0)
MCHC: 31.5 g/dL — ABNORMAL LOW (ref 32.0–36.0)
MCV: 92.1 fL (ref 80.0–100.0)
Monocytes Absolute: 344 cells/uL (ref 200–950)
Monocytes Relative: 8 %
Neutro Abs: 2623 cells/uL (ref 1500–7800)
Neutrophils Relative %: 61 %
Platelets: 198 10*3/uL (ref 140–400)
RBC: 4.07 MIL/uL (ref 3.80–5.10)
RDW: 16.4 % — ABNORMAL HIGH (ref 11.0–15.0)
WBC: 4.3 10*3/uL (ref 3.8–10.8)

## 2016-11-07 LAB — COMPLETE METABOLIC PANEL WITH GFR
ALT: 11 U/L (ref 6–29)
AST: 21 U/L (ref 10–35)
Albumin: 4 g/dL (ref 3.6–5.1)
Alkaline Phosphatase: 76 U/L (ref 33–130)
BUN: 13 mg/dL (ref 7–25)
CO2: 26 mmol/L (ref 20–31)
Calcium: 9.7 mg/dL (ref 8.6–10.4)
Chloride: 106 mmol/L (ref 98–110)
Creat: 0.86 mg/dL (ref 0.60–0.88)
GFR, Est African American: 70 mL/min (ref >=60)
GFR, Est Non African American: 61 mL/min (ref >=60)
Glucose, Bld: 86 mg/dL (ref 65–99)
Potassium: 3.8 mmol/L (ref 3.5–5.3)
Sodium: 143 mmol/L (ref 135–146)
Total Bilirubin: 0.8 mg/dL (ref 0.2–1.2)
Total Protein: 7.1 g/dL (ref 6.1–8.1)

## 2016-11-07 LAB — LIPID PANEL
Cholesterol: 180 mg/dL (ref <200)
HDL: 57 mg/dL (ref >50)
LDL Cholesterol: 105 mg/dL — ABNORMAL HIGH
Total CHOL/HDL Ratio: 3.2 Ratio (ref <5.0)
Triglycerides: 91 mg/dL (ref <150)
VLDL: 18 mg/dL (ref <30)

## 2016-11-07 LAB — TSH: TSH: 4.44 mIU/L

## 2016-11-09 ENCOUNTER — Other Ambulatory Visit: Payer: Self-pay

## 2016-11-09 MED ORDER — ATORVASTATIN CALCIUM 40 MG PO TABS
40.0000 mg | ORAL_TABLET | Freq: Every day | ORAL | 5 refills | Status: DC
Start: 2016-11-09 — End: 2017-02-19

## 2016-11-09 NOTE — Telephone Encounter (Signed)
Spoke with Bryon Lions regarding patients medication change and labs. meds sent to pharmacy.

## 2016-11-16 ENCOUNTER — Ambulatory Visit
Admission: RE | Admit: 2016-11-16 | Discharge: 2016-11-16 | Disposition: A | Discharge status: Home / Self Care | Payer: Medicare Other | Source: Ambulatory Visit | Attending: Internal Medicine | Admitting: Internal Medicine

## 2016-11-16 DIAGNOSIS — M810 Age-related osteoporosis without current pathological fracture: Secondary | ICD-10-CM

## 2016-11-16 DIAGNOSIS — Z780 Asymptomatic menopausal state: Secondary | ICD-10-CM | POA: Diagnosis not present

## 2016-11-20 ENCOUNTER — Telehealth: Payer: Self-pay | Admitting: *Deleted

## 2016-11-20 MED ORDER — ALENDRONATE SODIUM 70 MG PO TABS: 70.0000 mg | ORAL_TABLET | ORAL | 11 refills | Status: DC

## 2016-11-20 MED ORDER — VITAMIN D 50 MCG (2000 UT) PO CAPS: 1.0000 | ORAL_CAPSULE | Freq: Every day | ORAL | 2 refills | Status: DC

## 2016-11-20 NOTE — Telephone Encounter (Signed)
Spoke with the patient and her sister regarding her bone density results. She stated that she understood and did not have any questions at this time.I informed them that I will send the script to the pharmacy now.

## 2016-11-26 ENCOUNTER — Encounter: Payer: Self-pay | Admitting: *Deleted

## 2016-12-18 ENCOUNTER — Telehealth: Payer: Self-pay | Admitting: Internal Medicine

## 2016-12-18 NOTE — Telephone Encounter (Signed)
left msg asking pt to confirm this AWV w/ Alisa and EV w/ Dr. Mariea Clonts. VDM (DD)

## 2017-01-17 ENCOUNTER — Other Ambulatory Visit: Payer: Self-pay | Admitting: Internal Medicine

## 2017-01-28 ENCOUNTER — Ambulatory Visit: Payer: Medicare Other | Admitting: Nurse Practitioner

## 2017-02-07 ENCOUNTER — Encounter: Payer: Self-pay | Admitting: Internal Medicine

## 2017-02-07 ENCOUNTER — Ambulatory Visit (INDEPENDENT_AMBULATORY_CARE_PROVIDER_SITE_OTHER): Payer: Medicare Other | Admitting: Internal Medicine

## 2017-02-07 VITALS — BP 120/70 | HR 85 | Temp 97.9°F | Wt 154.0 lb

## 2017-02-07 DIAGNOSIS — Z7189 Other specified counseling: Secondary | ICD-10-CM | POA: Diagnosis not present

## 2017-02-07 DIAGNOSIS — A084 Viral intestinal infection, unspecified: Secondary | ICD-10-CM | POA: Diagnosis not present

## 2017-02-07 DIAGNOSIS — I10 Essential (primary) hypertension: Secondary | ICD-10-CM | POA: Diagnosis not present

## 2017-02-07 DIAGNOSIS — E890 Postprocedural hypothyroidism: Secondary | ICD-10-CM

## 2017-02-07 DIAGNOSIS — D0512 Intraductal carcinoma in situ of left breast: Secondary | ICD-10-CM | POA: Diagnosis not present

## 2017-02-07 DIAGNOSIS — F0150 Vascular dementia without behavioral disturbance: Secondary | ICD-10-CM | POA: Diagnosis not present

## 2017-02-07 LAB — CBC WITH DIFFERENTIAL/PLATELET
Basophils Absolute: 0 cells/uL (ref 0–200)
Basophils Relative: 0 %
Eosinophils Absolute: 80 cells/uL (ref 15–500)
Eosinophils Relative: 2 %
HCT: 37.3 % (ref 35.0–45.0)
Hemoglobin: 11.6 g/dL — ABNORMAL LOW (ref 11.7–15.5)
Lymphocytes Relative: 24 %
Lymphs Abs: 960 cells/uL (ref 850–3900)
MCH: 29.1 pg (ref 27.0–33.0)
MCHC: 31.1 g/dL — ABNORMAL LOW (ref 32.0–36.0)
MCV: 93.7 fL (ref 80.0–100.0)
MPV: 11 fL (ref 7.5–12.5)
Monocytes Absolute: 320 cells/uL (ref 200–950)
Monocytes Relative: 8 %
Neutro Abs: 2640 cells/uL (ref 1500–7800)
Neutrophils Relative %: 66 %
Platelets: 173 10*3/uL (ref 140–400)
RBC: 3.98 MIL/uL (ref 3.80–5.10)
RDW: 16 % — ABNORMAL HIGH (ref 11.0–15.0)
WBC: 4 10*3/uL (ref 3.8–10.8)

## 2017-02-07 LAB — BASIC METABOLIC PANEL
BUN: 13 mg/dL (ref 7–25)
CO2: 26 mmol/L (ref 20–31)
Calcium: 9.5 mg/dL (ref 8.6–10.4)
Chloride: 107 mmol/L (ref 98–110)
Creat: 0.88 mg/dL (ref 0.60–0.88)
Glucose, Bld: 90 mg/dL (ref 65–99)
Potassium: 3.8 mmol/L (ref 3.5–5.3)
Sodium: 143 mmol/L (ref 135–146)

## 2017-02-07 NOTE — Progress Notes (Signed)
Location:  [facility] clinic Provider:  Devlon Dosher L. Mariea Clonts, D.O., C.M.D.  Code Status:  DNR--reviewed with patient today--she says if her heart stops or she stops breathing, she does not want to be brought back--says that means it's her time.    Goals of Care:  Advanced Directives 02/07/2017  Does Patient Have a Medical Advance Directive? No  Type of Advance Directive -  Does patient want to make changes to medical advance directive? No - Patient declined  Copy of [place] in Chart? -  Would patient like information on creating a medical advance directive? -  Pre-existing out of facility DNR order (yellow form or pink MOST form) -  She reports she does have a living will and power of attorney for healthcare.    Chief Complaint  Patient presents with  . Medical Management of Chronic Issues    35mth follow-up    HPI: Patient is a 83 y.o. female seen today for medical management of chronic diseases.    Almost 2 weeks ago, she had diarrhea and vomiting--unsure how long it lasted.  She is just about recovered from it.  Still weak.    Now she has head congestion and a cough.  Starting this week.  No fever, sore throat.    She is eating ok.  She is drinking fluids, but probably not enough water per her sister.    Reports she is trying to find someone to stay with her.    Past Medical History:  Diagnosis Date  . Alzheimer's disease   . Cancer ([place])   . Disorder of bone and cartilage, unspecified   . Dizziness and giddiness   . Hyperlipidemia LDL goal < 100   . Hypertension   . Hypothyroidism   . Malignant neoplasm of thyroid gland ([place])   . Nontoxic uninodular goiter   . Other malaise and fatigue   . Other symptoms involving cardiovascular system   . Phlebitis and thrombophlebitis of unspecified site   . Senile osteoporosis   . Sleep related leg cramps   . Stroke ([place])   . Unspecified disorder of lipoid metabolism   . Unspecified hereditary and idiopathic  peripheral neuropathy   . Varicose veins of lower extremities with inflammation     Past Surgical History:  Procedure Laterality Date  . ABDOMINAL HYSTERECTOMY     DR HUGES  . CATRACT SURGERY  2008  . COLON SURGERY    . KNEE SURGERY     right  . PARATHYROID ADENOMA REMOVED  1983  . THYROID SURGERY  July 20, 2011    No Known Allergies  Allergies as of 02/07/2017   No Known Allergies     Medication List       Accurate as of 02/07/17 10:32 AM. Always use your most recent med list.          alendronate 70 MG tablet Commonly known as:  FOSAMAX Take 1 tablet (70 mg total) by mouth every 7 (seven) days. Take with a full glass of water on an empty stomach.   allopurinol 300 MG tablet Commonly known as:  ZYLOPRIM Take 1 tablet (300 mg total) by mouth daily.   amLODipine 10 MG tablet Commonly known as:  NORVASC Take 1 tablet (10 mg total) by mouth daily.   anastrozole 1 MG tablet Commonly known as:  ARIMIDEX Take 1 tablet (1 mg total) by mouth daily.   aspirin EC 81 MG tablet Take 1 tablet (81 mg total) by mouth  daily.   atenolol 25 MG tablet Commonly known as:  TENORMIN TAKE 1 TABLET (25 MG TOTAL) BY MOUTH DAILY.   atorvastatin 40 MG tablet Commonly known as:  LIPITOR Take 1 tablet (40 mg total) by mouth daily.   calcium carbonate 500 MG chewable tablet Commonly known as:  TUMS - dosed in mg elemental calcium Chew 1 tablet by mouth as needed for indigestion or heartburn.   iron polysaccharides 150 MG capsule Commonly known as:  NIFEREX Take 1 capsule (150 mg total) by mouth every other day.   levothyroxine 100 MCG tablet Commonly known as:  SYNTHROID, LEVOTHROID TAKE 1 TABLET EVERY DAY ON AN EMPTY STOMACH AND SEPARATED FROM OTHER MEDICATIONS   lisinopril 2.5 MG tablet Commonly known as:  PRINIVIL,ZESTRIL Take one tablet by mouth once daily to control blood pressure   sertraline 50 MG tablet Commonly known as:  ZOLOFT Take one tablet by mouth once daily  for depression   Vitamin D 2000 units Caps Take 1 capsule (2,000 Units total) by mouth daily.       Review of Systems:  Review of Systems  Constitutional: Negative for chills and fever.  HENT: Positive for congestion. Negative for ear discharge, ear pain, sinus pain and sore throat.   Eyes: Negative for blurred vision.  Respiratory: Positive for cough. Negative for sputum production, shortness of breath and wheezing.   Cardiovascular: Negative for chest pain and palpitations.  Gastrointestinal: Negative for abdominal pain, blood in stool, constipation and melena.  Genitourinary: Negative for dysuria.  Musculoskeletal: Positive for joint pain. Negative for falls and myalgias.       Right knee  Skin: Negative for rash.  Neurological: Positive for focal weakness. Negative for dizziness, loss of consciousness and headaches.       Chronic right sided weakness  Psychiatric/Behavioral: Positive for memory loss. Negative for depression.    Health Maintenance  Topic Date Due  . TETANUS/TDAP  12/08/1945  . ZOSTAVAX  12/07/1988  . INFLUENZA VACCINE  Completed  . DEXA SCAN  Completed  . PNA vac Low Risk Adult  Completed    Physical Exam: Vitals:   02/07/17 1024  BP: 120/70  Pulse: 85  Temp: 97.9 F (36.6 C)  TempSrc: Oral  SpO2: 91%  Weight: 154 lb (69.9 kg)   Body mass index is 29.1 kg/m. Physical Exam  Constitutional: She appears well-developed and well-nourished. No distress.  HENT:  Nasal congestion  Cardiovascular: Normal rate, regular rhythm, normal heart sounds and intact distal pulses.   Pulmonary/Chest: Effort normal and breath sounds normal. No respiratory distress.  Abdominal: Soft. Bowel sounds are normal.  Musculoskeletal: She exhibits tenderness.  Walks with cane in right hand; tenderness of right shoulder rotator cuff insertion  Neurological: She is alert.  Skin: Skin is warm and dry.    Labs reviewed: Basic Metabolic Panel:  Recent Labs   06/01/16 0958 11/05/16 1043 11/07/16 0952  NA 143 SEE NOTE 143  K 3.9 SEE NOTE 3.8  CL 102 SEE NOTE 106  CO2 26 SEE NOTE 26  GLUCOSE 78 SEE NOTE 86  BUN 21 SEE NOTE 13  CREATININE 0.98 SEE NOTE 0.86  CALCIUM 9.8 SEE NOTE 9.7  TSH 1.850  --  4.44   Liver Function Tests:  Recent Labs  11/05/16 1043 11/07/16 0952  AST SEE NOTE 21  ALT SEE NOTE 11  ALKPHOS SEE NOTE 76  BILITOT SEE NOTE 0.8  PROT SEE NOTE 7.1  ALBUMIN SEE NOTE 4.0   No  results for input(s): LIPASE, AMYLASE in the last 8760 hours. No results for input(s): AMMONIA in the last 8760 hours. CBC:  Recent Labs  03/09/16 1837 06/01/16 0958 11/07/16 0952  WBC 5.6 3.8 4.3  NEUTROABS 3.1 2.2 2,623  HGB 11.8*  --  11.8  HCT 37.1 37.1 37.5  MCV 90.0 93 92.1  PLT 179 193 198   Lipid Panel:  Recent Labs  11/07/16 0952  CHOL 180  HDL 57  LDLCALC 105*  TRIG 91  CHOLHDL 3.2   Lab Results  Component Value Date   HGBA1C 5.0 11/09/2015   Assessment/Plan 1. Viral gastroenteritis -resolved, but pt remains weak so check labs - CBC with Differential/Platelet - Basic metabolic panel  2. Vascular dementia without behavioral disturbance - ongoing, has short term memory loss, is trying to get more caregivers at home - DNR (Do Not Resuscitate)  3. Ductal carcinoma in situ (DCIS) of left breast - on tamoxifen for 5 year course - DNR (Do Not Resuscitate)  4. Hypothyroidism, postsurgical -cont current levothyroxine - Lab Results  Component Value Date   TSH 4.44 11/07/2016   - DNR (Do Not Resuscitate)  5. Essential hypertension, benign - bp at goal today, cont same regimen - DNR (Do Not Resuscitate)  6.  Advanced care planning/counseling: -reviewed code status today and pt requests DNR status -copies of advance directive requested -given copies of DNR form (one for sister [name] and one for pt to keep and bring to any hospitalizations)  Labs/tests ordered:  Orders Placed This Encounter  Procedures   . CBC with Differential/Platelet  . Basic metabolic panel  . DNR (Do Not Resuscitate)    See discussion from 02/07/17    Order Specific Question:   In the event of cardiac or respiratory ARREST    Answer:   Do not call a "code blue"    Order Specific Question:   In the event of cardiac or respiratory ARREST    Answer:   Do not perform Intubation, CPR, defibrillation or ACLS    Order Specific Question:   In the event of cardiac or respiratory ARREST    Answer:   Use medication by any route, position, wound care, and other measures to relive pain and suffering. May use oxygen, suction and manual treatment of airway obstruction as needed for comfort.    Next appt:  06/13/2017   Cyndee Giammarco L. Torianna Junio, D.O. [facility] [address], [place] [address] Cell Phone (Mon-Fri 8am-5pm):  [phone] On Call:  [phone] & follow prompts after 5pm & weekends Office Phone:  [phone] Office Fax:  [phone]

## 2017-02-07 NOTE — Patient Instructions (Signed)
PLEASE BRING [place] A COPY OF YOUR LIVING WILL AND HEALTH CARE POWER OF ATTORNEY. KEEP THE GOLD PAPER WITH DNR ON IT WITH YOUR MEDICATIONS.   WE WILL GIVE YOUR SISTER A COPY ALSO. IT SHOULD GO WITH YOU TO THE HOSPITAL IF YOU EVER HAVE TO GO.    DRINK PLENTY OF WATER AND JUICES.  GET YOUR REST.   YOU MAY USE OVER THE COUNTER COUGH SYRUP THAT SAYS YOU CAN TAKE IT WITH HIGH BLOOD PRESSURE.

## 2017-02-08 ENCOUNTER — Encounter: Payer: Self-pay | Admitting: *Deleted

## 2017-02-08 ENCOUNTER — Telehealth: Payer: Self-pay | Admitting: *Deleted

## 2017-02-08 MED ORDER — BENZONATATE 100 MG PO CAPS: ORAL_CAPSULE | ORAL | 0 refills | Status: DC

## 2017-02-08 NOTE — Telephone Encounter (Signed)
Let's send in tessalon perles 100mg  one capsule qhs prn coughing #30

## 2017-02-08 NOTE — Telephone Encounter (Signed)
[name] notified and agreed. Rx faxed to pharmacy.

## 2017-02-08 NOTE — Telephone Encounter (Signed)
Patient caregiver, [name] called and stated that patient was up all night with a dry hacking cough. Wants to know if something can be called in to ease this. Please Advise.

## 2017-02-13 DIAGNOSIS — E890 Postprocedural hypothyroidism: Secondary | ICD-10-CM | POA: Diagnosis not present

## 2017-02-13 DIAGNOSIS — C73 Malignant neoplasm of thyroid gland: Secondary | ICD-10-CM | POA: Diagnosis not present

## 2017-02-19 ENCOUNTER — Other Ambulatory Visit: Payer: Self-pay | Admitting: Internal Medicine

## 2017-03-25 ENCOUNTER — Ambulatory Visit (HOSPITAL_BASED_OUTPATIENT_CLINIC_OR_DEPARTMENT_OTHER): Payer: Medicare Other | Admitting: Oncology

## 2017-03-25 VITALS — BP 172/68 | HR 64 | Temp 98.3°F | Resp 17 | Ht 61.0 in | Wt 150.4 lb

## 2017-03-25 DIAGNOSIS — D0512 Intraductal carcinoma in situ of left breast: Secondary | ICD-10-CM | POA: Diagnosis not present

## 2017-03-25 DIAGNOSIS — Z170 Estrogen receptor positive status [ER+]: Secondary | ICD-10-CM | POA: Diagnosis not present

## 2017-03-25 DIAGNOSIS — M810 Age-related osteoporosis without current pathological fracture: Secondary | ICD-10-CM

## 2017-03-25 DIAGNOSIS — Z79811 Long term (current) use of aromatase inhibitors: Secondary | ICD-10-CM

## 2017-03-25 NOTE — Progress Notes (Signed)
[facility]  Telephone:([phone]) [phone] Fax:(336) [phone]     ID: Shelia Marks DOB: [birth year]  MR#: [id number]  VOH#:[id number]  Patient Care Team: Gayland Curry, DO as PCP - General (Geriatric Medicine) Rutherford Guys, MD as Consulting Physician (Ophthalmology) Chauncey Cruel, MD as Consulting Physician (Oncology) Autumn Messing III, MD as Consulting Physician (General Surgery) Jacelyn Pi, MD as Consulting Physician (Endocrinology) PCP: Hollace Kinnier, DO GYN: OTHER MD:  CHIEF COMPLAINT: Ductal carcinoma in situ  CURRENT TREATMENT: [facility] ]   BREAST CANCER HISTORY: From the original intake note:  "Shelia Marks" had bilateral screening mammography at the [facility] 06/07/2016 showing calcifications in the left breast. She was recalled  05/15/2016 for left diagnostic mammography. This showed the breast density to be category be. In the upper outer left breast there was an area of suspicious calcifications measuring 1.7 cm.  Biopsy of this area was obtained 05/21/2016, and showed (S [phone]) ductal carcinoma in situ, high-grade, estrogen receptor 100% positive, progesterone receptor 5% positive, WITH strong staining intensity.  Her subsequent history is as detailed below.  INTERVAL HISTORY: Shelia Marks returns today for follow-up of her noninvasive breast cancer accompanied by her sister. Shelia Marks continues on anastrozole, with good tolerance. She does not have problems with hot flashes or vaginal dryness. She obtains the drug at a good price.  REVIEW OF SYSTEMS: Her worst problem is her right arm which hurts and is not very useful right now. She is seeing her orthopedist and did receive a shot which she says helped a little. She is concerned about losing weight. She also tells me her left knee is a big problem and she has a great deal of difficulty walking although she uses a cane. Aside from these issues a detailed review of systems today was  stable.  PAST MEDICAL HISTORY: Past Medical History:  Diagnosis Date  . Alzheimer's disease   . Cancer ([place])   . Disorder of bone and cartilage, unspecified   . Dizziness and giddiness   . Hyperlipidemia LDL goal < 100   . Hypertension   . Hypothyroidism   . Malignant neoplasm of thyroid gland ([place])   . Nontoxic uninodular goiter   . Other malaise and fatigue   . Other symptoms involving cardiovascular system   . Phlebitis and thrombophlebitis of unspecified site   . Senile osteoporosis   . Sleep related leg cramps   . Stroke ([place])   . Unspecified disorder of lipoid metabolism   . Unspecified hereditary and idiopathic peripheral neuropathy   . Varicose veins of lower extremities with inflammation     PAST SURGICAL HISTORY: Past Surgical History:  Procedure Laterality Date  . ABDOMINAL HYSTERECTOMY     DR HUGES  . CATRACT SURGERY  2008  . COLON SURGERY    . KNEE SURGERY     right  . PARATHYROID ADENOMA REMOVED  1983  . THYROID SURGERY  July 20, 2011    FAMILY HISTORY Family History  Problem Relation Age of Onset  . Diabetes Brother   The patient's father died from liver problems in his 49s. The patient's mother died in her 7s from "natural causes". The patient had no brothers. She had 6 sisters, 2 of whom died from complications of diabetes and one from a ruptured aneurysm. There is no history of breast or ovarian cancer in the family.   GYNECOLOGIC HISTORY:  No LMP recorded. Patient has had a hysterectomy.  Shelia Marks does not remember how she was when she had her  first period. She never carried a child to term. He is status post hysterectomy and at least one ovary was removed. She never took hormone replacement.   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     ADVANCED DIRECTIVES:  [name] [name] DAVIS IS HER HEALTHCARE PART OF ATTORNEY. [name] CAN BE REACHED AT  [phone].   HEALTH MAINTENANCE: Social History  Substance Use Topics  . Smoking status: Never Smoker  . Smokeless tobacco: Never Used  . Alcohol use No     No Known Allergies  Current Outpatient Prescriptions  Medication Sig Dispense Refill  . alendronate (FOSAMAX) 70 MG tablet Take 1 tablet (70 mg total) by mouth every 7 (seven) days. Take with a full glass of water on an empty stomach. 4 tablet 11  . allopurinol (ZYLOPRIM) 300 MG tablet Take 1 tablet (300 mg total) by mouth daily. 90 tablet 3  . amLODipine (NORVASC) 10 MG tablet Take 1 tablet (10 mg total) by mouth daily. 90 tablet 3  . anastrozole (ARIMIDEX) 1 MG tablet Take 1 tablet (1 mg total) by mouth daily. 90 tablet 4  . aspirin EC 81 MG tablet Take 1 tablet (81 mg total) by mouth daily. 30 tablet 3  . atenolol (TENORMIN) 25 MG tablet TAKE 1 TABLET (25 MG TOTAL) BY MOUTH DAILY. 90 tablet 1  . atorvastatin (LIPITOR) 40 MG tablet TAKE 1 TABLET EVERY DAY 90 tablet 1  . benzonatate (TESSALON PERLES) 100 MG capsule Take one capsule by mouth at bedtime as needed for cough 30 capsule 0  . calcium carbonate (TUMS - DOSED IN MG ELEMENTAL CALCIUM) 500 MG chewable tablet Chew 1 tablet by mouth as needed for indigestion or heartburn.    . Cholecalciferol (VITAMIN D) 2000 units CAPS Take 1 capsule (2,000 Units total) by mouth daily. 30 capsule 2  . iron polysaccharides (NIFEREX) 150 MG capsule Take 1 capsule (150 mg total) by mouth every other day. 15 capsule 3  . levothyroxine (SYNTHROID, LEVOTHROID) 100 MCG tablet TAKE 1 TABLET EVERY DAY ON AN EMPTY STOMACH AND SEPARATED FROM OTHER MEDICATIONS 90 tablet 1  . lisinopril (PRINIVIL,ZESTRIL) 2.5 MG tablet Take one tablet by mouth once daily to control blood pressure 90 tablet 3  . sertraline (ZOLOFT) 50 MG tablet Take one tablet by mouth once daily for depression 90 tablet 3   No current facility-administered medications for this visit.     OBJECTIVE: Older white woman Walking with a  cane   Vitals:   03/25/17 1142  BP: (!) 172/68  Pulse: 64  Resp: 17  Temp: 98.3 F (36.8 C)     Body mass index is 28.42 kg/m.    ECOG FS:2 - Symptomatic, <50% confined to bed  Sclerae unicteric, EOMs intact Oropharynx clear and moist No cervical or supraclavicular adenopathy Lungs no rales or rhonchi Heart regular rate and rhythm Abd soft, nontender, positive bowel sounds MSK no focal spinal tenderness, no upper extremity lymphedema Neuro: nonfocal, well oriented, appropriate affect Breasts: No masses palpated in either breast. Both axillae are benign.   LAB RESULTS:  CMP     Component Value Date/Time   NA 143 02/07/2017 0001   NA 143 06/01/2016 0958   K 3.8 02/07/2017 0001   CL 107 02/07/2017 0001   CO2 26 02/07/2017 0001   GLUCOSE 90 02/07/2017 0001   BUN 13 02/07/2017 0001  BUN 21 06/01/2016 0958   CREATININE 0.88 02/07/2017 0001   CALCIUM 9.5 02/07/2017 0001   PROT 7.1 11/07/2016 0952   PROT 6.8 11/09/2015 1013   ALBUMIN 4.0 11/07/2016 0952   ALBUMIN 4.1 11/09/2015 1013   AST 21 11/07/2016 0952   ALT 11 11/07/2016 0952   ALKPHOS 76 11/07/2016 0952   BILITOT 0.8 11/07/2016 0952   BILITOT 0.5 11/09/2015 1013   GFRNONAA 61 11/07/2016 0952   GFRAA 70 11/07/2016 0952    INo results found for: SPEP, UPEP  Lab Results  Component Value Date   WBC 4.0 02/07/2017   NEUTROABS 2,640 02/07/2017   HGB 11.6 (L) 02/07/2017   HCT 37.3 02/07/2017   MCV 93.7 02/07/2017   PLT 173 02/07/2017      Chemistry      Component Value Date/Time   NA 143 02/07/2017 0001   NA 143 06/01/2016 0958   K 3.8 02/07/2017 0001   CL 107 02/07/2017 0001   CO2 26 02/07/2017 0001   BUN 13 02/07/2017 0001   BUN 21 06/01/2016 0958   CREATININE 0.88 02/07/2017 0001      Component Value Date/Time   CALCIUM 9.5 02/07/2017 0001   ALKPHOS 76 11/07/2016 0952   AST 21 11/07/2016 0952   ALT 11 11/07/2016 0952   BILITOT 0.8 11/07/2016 0952   BILITOT 0.5 11/09/2015 1013       No  results found for: LABCA2  No components found for: HCWCB762  No results for input(s): INR in the last 168 hours.  Urinalysis    Component Value Date/Time   COLORURINE STRAW (A) 03/09/2016 2001   APPEARANCEUR CLEAR 03/09/2016 2001   LABSPEC 1.006 03/09/2016 2001   PHURINE 7.5 03/09/2016 2001   GLUCOSEU NEGATIVE 03/09/2016 2001   HGBUR NEGATIVE 03/09/2016 2001   [place] NEGATIVE 03/09/2016 2001   [place] 03/09/2016 2001   PROTEINUR NEGATIVE 03/09/2016 2001   UROBILINOGEN 0.2 03/05/2014 2055   NITRITE NEGATIVE 03/09/2016 2001   LEUKOCYTESUR NEGATIVE 03/09/2016 2001        STUDIES: No results found.  ELIGIBLE FOR AVAILABLE RESEARCH PROTOCOL: no  ASSESSMENT: 83 y.o. [place] woman status post left breast upper outer quadrant biopsy 05/21/2016 for ductal carcinoma in situ, high-grade, estrogen and progesterone receptor positive  (1) opted against surgery   (2) started anastrozole neoadjuvantly 06/13/2016, discontinued June 2018 due to osteoporosis concerns  (a)  bone density 11/16/2016 shows a T score of -4.7 (osteoporosis). The the    PLAN:  Ms. Bessent continues to tolerate anastrozole with no side effects she is aware of. She just refilled a prescription for 3 months and is comfortable continuing on it.  However I am concerned about the extend of her osteoporosis. Anastrozole can make that worse, with most of the additional bone loss occurring in the first year.  She has just been started on Fosamax. She is doing well with that medication. I think a good plan may be to repeat a bone scan in one year and if there is improvement on the alendronate then give the anastrozole a second chance. Otherwise I think it would be best to follow her with observation alone recalling that she has noninvasive breast cancer which is in itself not life threatening.  She is scheduled for repeat mammography in May. I will also entered an order for repeat mammography of the  left breast only in November. This will allow [place] to keep a close watch on the area in question.  They are in  agreement with this plan. She will go ahead and take the medications that she has already purchased and then stop the anastrozole.  She will see me again in a year. She knows to call for any problems that may develop before then.    Chauncey Cruel, MD   03/25/2017 12:17 PM Medical Oncology and Hematology [facility] [address] [place], [place] [address] Tel. [phone]    Fax. [phone]

## 2017-04-15 ENCOUNTER — Other Ambulatory Visit: Payer: Self-pay | Admitting: Internal Medicine

## 2017-04-19 ENCOUNTER — Telehealth: Payer: Self-pay

## 2017-04-19 NOTE — Telephone Encounter (Signed)
Bryon Lions called to inform Dr.Reed patient's Levothyroxine is 112 mcg not 100 mcg. We filled rx for 100 mg on 04/15/17.   [name] informed the pharmacy that patient's refill request for Levothyroxine should come from [facility] only.  Pharmacy will provide patient with the correct dosage  Med list updated

## 2017-04-23 ENCOUNTER — Telehealth: Payer: Self-pay | Admitting: Internal Medicine

## 2017-04-23 MED ORDER — ATORVASTATIN CALCIUM 40 MG PO TABS: 40.0000 mg | ORAL_TABLET | Freq: Every day | ORAL | 3 refills | Status: DC

## 2017-04-23 MED ORDER — ATENOLOL 25 MG PO TABS
25.0000 mg | ORAL_TABLET | Freq: Every day | ORAL | 3 refills | Status: DC
Start: 2017-04-23 — End: 2017-07-15

## 2017-04-23 MED ORDER — LISINOPRIL 2.5 MG PO TABS: ORAL_TABLET | ORAL | 3 refills | Status: DC

## 2017-04-23 MED ORDER — POLYETHYLENE GLYCOL 3350 17 GM/SCOOP PO POWD: ORAL | 0 refills | Status: DC

## 2017-04-23 MED ORDER — ALLOPURINOL 300 MG PO TABS: 300.0000 mg | ORAL_TABLET | Freq: Every day | ORAL | 3 refills | Status: DC

## 2017-04-23 MED ORDER — ALENDRONATE SODIUM 70 MG PO TABS: 70.0000 mg | ORAL_TABLET | ORAL | 11 refills | Status: DC

## 2017-04-23 MED ORDER — AMLODIPINE BESYLATE 10 MG PO TABS: 10.0000 mg | ORAL_TABLET | Freq: Every day | ORAL | 3 refills | Status: DC

## 2017-04-23 MED ORDER — SERTRALINE HCL 50 MG PO TABS: ORAL_TABLET | ORAL | 3 refills | Status: DC

## 2017-04-23 NOTE — Telephone Encounter (Signed)
Patient caregiver notified and agreed. Medication list updated and faxed to pharmacy.  Caregiver also wants 90 day supply of all Rx's faxed to [facility].faxed

## 2017-04-23 NOTE — Telephone Encounter (Signed)
I recommend miralax 17 grams in 8oz of water or juice now and if no bm by bedtime, repeat miralax dose. If this is not successful, she should call us back.

## 2017-04-23 NOTE — Telephone Encounter (Signed)
Shelia Marks called for patient, stating that patient has been constipated for about a week now. Shelia Marks wanted to know what she needs to give the patient to help her get relief fast.   Please advise.

## 2017-05-15 DIAGNOSIS — E890 Postprocedural hypothyroidism: Secondary | ICD-10-CM | POA: Diagnosis not present

## 2017-05-15 DIAGNOSIS — C73 Malignant neoplasm of thyroid gland: Secondary | ICD-10-CM | POA: Diagnosis not present

## 2017-06-07 ENCOUNTER — Ambulatory Visit
Admission: RE | Admit: 2017-06-07 | Discharge: 2017-06-07 | Disposition: A | Discharge status: Home / Self Care | Payer: Medicare Other | Source: Ambulatory Visit | Attending: Oncology | Admitting: Oncology

## 2017-06-07 DIAGNOSIS — R922 Inconclusive mammogram: Secondary | ICD-10-CM | POA: Diagnosis not present

## 2017-06-07 DIAGNOSIS — D0512 Intraductal carcinoma in situ of left breast: Secondary | ICD-10-CM

## 2017-06-10 ENCOUNTER — Encounter: Payer: Self-pay | Admitting: Oncology

## 2017-06-10 ENCOUNTER — Other Ambulatory Visit: Payer: Self-pay | Admitting: Oncology

## 2017-06-10 DIAGNOSIS — D0512 Intraductal carcinoma in situ of left breast: Secondary | ICD-10-CM

## 2017-06-13 ENCOUNTER — Ambulatory Visit (INDEPENDENT_AMBULATORY_CARE_PROVIDER_SITE_OTHER): Payer: Medicare Other

## 2017-06-13 ENCOUNTER — Ambulatory Visit (INDEPENDENT_AMBULATORY_CARE_PROVIDER_SITE_OTHER): Payer: Medicare Other | Admitting: Internal Medicine

## 2017-06-13 ENCOUNTER — Encounter: Payer: Self-pay | Admitting: Internal Medicine

## 2017-06-13 VITALS — BP 130/70 | HR 56 | Temp 98.5°F | Ht 61.0 in | Wt 154.0 lb

## 2017-06-13 DIAGNOSIS — F0150 Vascular dementia without behavioral disturbance: Secondary | ICD-10-CM

## 2017-06-13 DIAGNOSIS — K5901 Slow transit constipation: Secondary | ICD-10-CM | POA: Diagnosis not present

## 2017-06-13 DIAGNOSIS — I10 Essential (primary) hypertension: Secondary | ICD-10-CM

## 2017-06-13 DIAGNOSIS — E890 Postprocedural hypothyroidism: Secondary | ICD-10-CM

## 2017-06-13 DIAGNOSIS — I69993 Ataxia following unspecified cerebrovascular disease: Secondary | ICD-10-CM | POA: Diagnosis not present

## 2017-06-13 DIAGNOSIS — M75101 Unspecified rotator cuff tear or rupture of right shoulder, not specified as traumatic: Secondary | ICD-10-CM | POA: Diagnosis not present

## 2017-06-13 DIAGNOSIS — Z0000 Encounter for general adult medical examination without abnormal findings: Secondary | ICD-10-CM | POA: Diagnosis not present

## 2017-06-13 DIAGNOSIS — Z135 Encounter for screening for eye and ear disorders: Secondary | ICD-10-CM | POA: Diagnosis not present

## 2017-06-13 DIAGNOSIS — D0512 Intraductal carcinoma in situ of left breast: Secondary | ICD-10-CM | POA: Diagnosis not present

## 2017-06-13 NOTE — Progress Notes (Signed)
Subjective:   Shelia Marks is a 83 y.o. female who presents for Medicare Annual (Subsequent) preventive examination.     Objective:     Vitals: BP 130/70 (BP Location: Left Arm, Patient Position: Sitting)   Pulse (!) 56   Temp 98.5 F (36.9 C) (Oral)   Ht 5\' 1"  (1.549 m)   Wt 154 lb (69.9 kg)   SpO2 98%   BMI 29.10 kg/m   Body mass index is 29.1 kg/m.   Tobacco History  Smoking Status  . Never Smoker  Smokeless Tobacco  . Never Used     Counseling given: Not Answered   Past Medical History:  Diagnosis Date  . Alzheimer's disease   . Cancer ([place])   . Disorder of bone and cartilage, unspecified   . Dizziness and giddiness   . Hyperlipidemia LDL goal < 100   . Hypertension   . Hypothyroidism   . Malignant neoplasm of thyroid gland ([place])   . Nontoxic uninodular goiter   . Other malaise and fatigue   . Other symptoms involving cardiovascular system   . Phlebitis and thrombophlebitis of unspecified site   . Senile osteoporosis   . Sleep related leg cramps   . Stroke ([place])   . Unspecified disorder of lipoid metabolism   . Unspecified hereditary and idiopathic peripheral neuropathy   . Varicose veins of lower extremities with inflammation    Past Surgical History:  Procedure Laterality Date  . ABDOMINAL HYSTERECTOMY     DR HUGES  . BREAST BIOPSY Left 05/21/2016   malignant  . CATRACT SURGERY  2008  . COLON SURGERY    . KNEE SURGERY     right  . PARATHYROID ADENOMA REMOVED  1983  . THYROID SURGERY  July 20, 2011   Family History  Problem Relation Age of Onset  . Diabetes Brother    History  Sexual Activity  . Sexual activity: Not on file    Outpatient Encounter Prescriptions as of 06/13/2017  Medication Sig  . alendronate (FOSAMAX) 70 MG tablet Take 1 tablet (70 mg total) by mouth every 7 (seven) days. Take with a full glass of water on an empty stomach.  Marland Kitchen allopurinol (ZYLOPRIM) 300 MG tablet Take 1 tablet (300 mg total) by mouth daily.  Marland Kitchen  amLODipine (NORVASC) 10 MG tablet Take 1 tablet (10 mg total) by mouth daily.  Marland Kitchen aspirin EC 81 MG tablet Take 1 tablet (81 mg total) by mouth daily.  Marland Kitchen atenolol (TENORMIN) 25 MG tablet Take 1 tablet (25 mg total) by mouth daily.  Marland Kitchen atorvastatin (LIPITOR) 40 MG tablet Take 1 tablet (40 mg total) by mouth daily.  . benzonatate (TESSALON PERLES) 100 MG capsule Take one capsule by mouth at bedtime as needed for cough  . calcium carbonate (TUMS - DOSED IN MG ELEMENTAL CALCIUM) 500 MG chewable tablet Chew 1 tablet by mouth as needed for indigestion or heartburn.  . Cholecalciferol (VITAMIN D) 2000 units CAPS Take 1 capsule (2,000 Units total) by mouth daily.  . iron polysaccharides (NIFEREX) 150 MG capsule Take 1 capsule (150 mg total) by mouth every other day.  . levothyroxine (SYNTHROID, LEVOTHROID) 112 MCG tablet Take 112 mcg by mouth daily before breakfast. RX'ed Dr.Balan  . lisinopril (PRINIVIL,ZESTRIL) 2.5 MG tablet Take one tablet by mouth once daily to control blood pressure  . polyethylene glycol powder (GLYCOLAX/MIRALAX) powder 17 grams in 8oz of water or juice now and if no Bowel Movement by bedtime, repeat Miralax dose. If this  is unsuccessful call office.  . sertraline (ZOLOFT) 50 MG tablet Take one tablet by mouth once daily for depression  . [DISCONTINUED] anastrozole (ARIMIDEX) 1 MG tablet Take 1 tablet (1 mg total) by mouth daily.   No facility-administered encounter medications on file as of 06/13/2017.     Activities of Daily Living In your present state of health, do you have any difficulty performing the following activities: 06/13/2017  Hearing? Y  Vision? Y  Difficulty concentrating or making decisions? Y  Walking or climbing stairs? Y  Dressing or bathing? Y  Doing errands, shopping? Y  Preparing Food and eating ? Y  Using the Toilet? N  In the past six months, have you accidently leaked urine? Y  Do you have problems with loss of bowel control? N  Managing your  Medications? N  Managing your Finances? N  Housekeeping or managing your Housekeeping? N  Some recent data might be hidden    Patient Care Team: Gayland Curry, DO as PCP - General (Geriatric Medicine) Rutherford Guys, MD as Consulting Physician (Ophthalmology) Magrinat, Virgie Dad, MD as Consulting Physician (Oncology) Jovita Kussmaul, MD as Consulting Physician (General Surgery) Jacelyn Pi, MD as Consulting Physician (Endocrinology)    Assessment:   Exercise Activities and Dietary recommendations Current Exercise Habits: The patient does not participate in regular exercise at present, Exercise limited by: None identified  Goals    . Maintain LIfestyle          Starting today pt will maintain lifestyle.       Fall Risk Fall Risk  06/13/2017 02/07/2017 08/02/2016 06/01/2016 02/02/2016  Falls in the past year? No No No Yes No  Number falls in past yr: - - - 1 -  Injury with Fall? - - - Yes -   Depression Screen PHQ 2/9 Scores 06/13/2017 02/07/2017 08/02/2016 06/01/2016  PHQ - 2 Score 0 0 0 0  PHQ- 9 Score - - - -     Cognitive Function MMSE - Mini Mental State Exam 06/13/2017 06/01/2016 05/27/2014 06/08/2013  Orientation to time 2 5 3 3   Orientation to Place 5 5 4 5   Registration 3 3 3 3   Attention/ Calculation 0 3 3 3   Recall 0 0 0 1  Language- name 2 objects 2 2 2 2   Language- repeat 1 1 1 1   Language- follow 3 step command 2 2 3 3   Language- read & follow direction 1 1 1 1   Write a sentence 0 1 1 1   Copy design 1 0 1 0  Total score 17 23 22 23         Immunization History  Administered Date(s) Administered  . Influenza, High Dose Seasonal PF 10/05/2016  . Influenza,inj,Quad PF,36+ Mos 10/12/2013, 10/31/2015  . Pneumococcal Conjugate-13 08/27/2014  . Pneumococcal Polysaccharide-23 06/01/2016   Screening Tests Health Maintenance  Topic Date Due  . TETANUS/TDAP  12/08/1945  . INFLUENZA VACCINE  07/31/2017  . DEXA SCAN  Completed  . PNA vac Low Risk Adult  Completed       Plan:    I have personally reviewed and addressed the Medicare Annual Wellness questionnaire and have noted the following in the patient's chart:  A. Medical and social history B. Use of alcohol, tobacco or illicit drugs  C. Current medications and supplements D. Functional ability and status E.  Nutritional status F.  Physical activity G. Advance directives H. List of other physicians I.  Hospitalizations, surgeries, and ER visits in previous 12 months  J.  Vitals K. Screenings to include hearing, vision, cognitive, depression L. Referrals and appointments - none  In addition, I have reviewed and discussed with patient certain preventive protocols, quality metrics, and best practice recommendations. A written personalized care plan for preventive services as well as general preventive health recommendations were provided to patient.  See attached scanned questionnaire for additional information.   Signed,   Rich Reining, RN Nurse Health Advisor   Quick Notes   Health Maintenance: TDAP and shingles due. Pt will let us know if she wants a prescription     Abnormal Screen: MMSE 17/30. Did not pass clock drawing     Patient Concerns: R arm sore for more than a month     Nurse Concerns: None   I reviewed health advisor's note, was available for consultation and agree with the assessment and plan as written.    Tiffany L. Reed, D.O. [facility] [address] [place], [place] [address] Cell Phone (Mon-Fri 8am-5pm):  [phone] On Call:  [phone] & follow prompts after 5pm & weekends Office Phone:  [phone] Office Fax:  [phone]

## 2017-06-13 NOTE — Patient Instructions (Signed)
High-Fiber Diet Fiber, also called dietary fiber, is a type of carbohydrate found in fruits, vegetables, whole grains, and beans. A high-fiber diet can have many health benefits. Your health care provider may recommend a high-fiber diet to help:  Prevent constipation. Fiber can make your bowel movements more regular.  Lower your cholesterol.  Relieve hemorrhoids, uncomplicated diverticulosis, or irritable bowel syndrome.  Prevent overeating as part of a weight-loss plan.  Prevent heart disease, type 2 diabetes, and certain cancers.  What is my plan? The recommended daily intake of fiber includes:  38 grams for men under age 1.  4 grams for men over age 89.  35 grams for women under age 69.  82 grams for women over age 76.  You can get the recommended daily intake of dietary fiber by eating a variety of fruits, vegetables, grains, and beans. Your health care provider may also recommend a fiber supplement if it is not possible to get enough fiber through your diet. What do I need to know about a high-fiber diet?  Fiber supplements have not been widely studied for their effectiveness, so it is better to get fiber through food sources.  Always check the fiber content on thenutrition facts label of any prepackaged food. Look for foods that contain at least 5 grams of fiber per serving.  Ask your dietitian if you have questions about specific foods that are related to your condition, especially if those foods are not listed in the following section.  Increase your daily fiber consumption gradually. Increasing your intake of dietary fiber too quickly may cause bloating, cramping, or gas.  Drink plenty of water. Water helps you to digest fiber. What foods can I eat? Grains Whole-grain breads. Multigrain cereal. Oats and oatmeal. Brown rice. Barley. Bulgur wheat. [place]. Bran muffins. Popcorn. Rye wafer crackers. Vegetables Sweet potatoes. Spinach. Kale. Artichokes. Cabbage.  Broccoli. Green peas. Carrots. Squash. Fruits Berries. Pears. Apples. Oranges. Avocados. Prunes and raisins. Dried figs. Meats and Other Protein Sources [place], kidney, pinto, and soy beans. Split peas. Lentils. Nuts and seeds. Dairy Fiber-fortified yogurt. Beverages Fiber-fortified soy milk. Fiber-fortified orange juice. Other Fiber bars. The items listed above may not be a complete list of recommended foods or beverages. Contact your dietitian for more options. What foods are not recommended? Grains White bread. Pasta made with refined flour. White rice. Vegetables Fried potatoes. Canned vegetables. Well-cooked vegetables. Fruits Fruit juice. Cooked, strained fruit. Meats and Other Protein Sources Fatty cuts of meat. Fried Sales executive or fried fish. Dairy Milk. Yogurt. Cream cheese. Sour cream. Beverages Soft drinks. Other Cakes and pastries. Butter and oils. The items listed above may not be a complete list of foods and beverages to avoid. Contact your dietitian for more information. What are some tips for including high-fiber foods in my diet?  Eat a wide variety of high-fiber foods.  Make sure that half of all grains consumed each day are whole grains.  Replace breads and cereals made from refined flour or white flour with whole-grain breads and cereals.  Replace white rice with brown rice, bulgur wheat, or millet.  Start the day with a breakfast that is high in fiber, such as a cereal that contains at least 5 grams of fiber per serving.  Use beans in place of meat in soups, salads, or pasta.  Eat high-fiber snacks, such as berries, raw vegetables, nuts, or popcorn. This information is not intended to replace advice given to you by your health care provider. Make sure you discuss any  questions you have with your health care provider. Document Released: 12/17/2005 Document Revised: 05/24/2016 Document Reviewed: 06/01/2014 Elsevier Interactive Patient Education  2017  [employer]vier [employer]  

## 2017-06-13 NOTE — Progress Notes (Signed)
Location:  [facility] clinic Provider:  Damyra Luscher L. Mariea Clonts, D.O., C.M.D.  Code Status: DNR Goals of Care:  Advanced Directives 06/13/2017  Does Patient Have a Medical Advance Directive? Yes  Type of Advance Directive Out of facility DNR (pink MOST or yellow form)  Does patient want to make changes to medical advance directive? No - Patient declined  Copy of [place] in Chart? -  Would patient like information on creating a medical advance directive? -  Pre-existing out of facility DNR order (yellow form or pink MOST form) Yellow form placed in chart (order not valid for inpatient use)   Chief Complaint  Patient presents with  . Follow-up    extended visit    HPI: Patient is a 83 y.o. female seen today for medical management of chronic diseases.    She is back at home alone.  She is scoring 17/30 failed clock.  She reports doing ok.  She's not cooking.  Her sister brings her food.  Discussed concerns about safety with cooking--she is not cooking b/c of one time she did leave the stove on.   It's really slow getting ready in the morning.    She's wet all of the time with incontinence.  Using depends.  Sponge bath each am, does not get in the tub.  Does not get in the tub at all. Her sister's daughter will put a chair in the shower to help her get a bath.  She knows to call 911 and get out of the house in the event of a fire.    Appetite is good.  Weight 154 up from 150 lbs.    No melena or hematochezia.  Bowels remain lazy.  Counseled on diet to improve bms.  Sleeps pretty well at night.    Sometimes still dizzy or lightheaded.  No falls.  Still happening when she gets up too fast.   Does not have the energy for yardwork.    Does not notice differences in her left breast.  Had her mammogram.  Showed some increase in microcalcifications in area of prior ductal ca.  Is to f/u with Dr. Jana Hakim about this and determine if diagnostic mammo should be done.  Past Medical  History:  Diagnosis Date  . Alzheimer's disease   . Cancer ([place])   . Disorder of bone and cartilage, unspecified   . Dizziness and giddiness   . Hyperlipidemia LDL goal < 100   . Hypertension   . Hypothyroidism   . Malignant neoplasm of thyroid gland ([place])   . Nontoxic uninodular goiter   . Other malaise and fatigue   . Other symptoms involving cardiovascular system   . Phlebitis and thrombophlebitis of unspecified site   . Senile osteoporosis   . Sleep related leg cramps   . Stroke ([place])   . Unspecified disorder of lipoid metabolism   . Unspecified hereditary and idiopathic peripheral neuropathy   . Varicose veins of lower extremities with inflammation     Past Surgical History:  Procedure Laterality Date  . ABDOMINAL HYSTERECTOMY     DR HUGES  . BREAST BIOPSY Left 05/21/2016   malignant  . CATRACT SURGERY  2008  . COLON SURGERY    . KNEE SURGERY     right  . PARATHYROID ADENOMA REMOVED  1983  . THYROID SURGERY  July 20, 2011    No Known Allergies  Allergies as of 06/13/2017   No Known Allergies     Medication List  Accurate as of 06/13/17  9:35 AM. Always use your most recent med list.          alendronate 70 MG tablet Commonly known as:  FOSAMAX Take 1 tablet (70 mg total) by mouth every 7 (seven) days. Take with a full glass of water on an empty stomach.   allopurinol 300 MG tablet Commonly known as:  ZYLOPRIM Take 1 tablet (300 mg total) by mouth daily.   amLODipine 10 MG tablet Commonly known as:  NORVASC Take 1 tablet (10 mg total) by mouth daily.   aspirin EC 81 MG tablet Take 1 tablet (81 mg total) by mouth daily.   atenolol 25 MG tablet Commonly known as:  TENORMIN Take 1 tablet (25 mg total) by mouth daily.   atorvastatin 40 MG tablet Commonly known as:  LIPITOR Take 1 tablet (40 mg total) by mouth daily.   benzonatate 100 MG capsule Commonly known as:  TESSALON PERLES Take one capsule by mouth at bedtime as needed for cough     calcium carbonate 500 MG chewable tablet Commonly known as:  TUMS - dosed in mg elemental calcium Chew 1 tablet by mouth as needed for indigestion or heartburn.   iron polysaccharides 150 MG capsule Commonly known as:  NIFEREX Take 1 capsule (150 mg total) by mouth every other day.   levothyroxine 112 MCG tablet Commonly known as:  SYNTHROID, LEVOTHROID Take 112 mcg by mouth daily before breakfast. RX'ed Dr.Balan   lisinopril 2.5 MG tablet Commonly known as:  PRINIVIL,ZESTRIL Take one tablet by mouth once daily to control blood pressure   polyethylene glycol powder powder Commonly known as:  GLYCOLAX/MIRALAX 17 grams in 8oz of water or juice now and if no Bowel Movement by bedtime, repeat Miralax dose. If this is unsuccessful call office.   sertraline 50 MG tablet Commonly known as:  ZOLOFT Take one tablet by mouth once daily for depression   Vitamin D 2000 units Caps Take 1 capsule (2,000 Units total) by mouth daily.       Review of Systems:  Review of Systems  Constitutional: Negative for chills, fever and malaise/fatigue.  HENT: Positive for hearing loss. Negative for congestion.   Eyes: Negative for blurred vision.  Respiratory: Negative for cough and shortness of breath.   Cardiovascular: Negative for chest pain and palpitations.  Gastrointestinal: Negative for abdominal pain, blood in stool, constipation, diarrhea and melena.  Genitourinary: Positive for urgency. Negative for dysuria.       Urinary incontinence with some continuous leakage  Musculoskeletal: Positive for joint pain. Negative for falls and myalgias.       Right shoulder and arm painful, pain behind upper arm "in the fat"  Skin: Negative for itching and rash.  Neurological: Negative for dizziness, loss of consciousness and weakness.  Endo/Heme/Allergies: Does not bruise/bleed easily.  Psychiatric/Behavioral: Positive for memory loss. Negative for depression.    Health Maintenance  Topic Date Due   . TETANUS/TDAP  12/08/1945  . INFLUENZA VACCINE  07/31/2017  . DEXA SCAN  Completed  . PNA vac Low Risk Adult  Completed    Physical Exam: Vitals:   06/13/17 0849  BP: 130/70  Pulse: (!) 56  Temp: 98.5 F (36.9 C)  TempSrc: Oral  SpO2: 98%  Weight: 154 lb (69.9 kg)  Height: 5\' 1"  (1.549 m)   Body mass index is 29.1 kg/m. Physical Exam  Constitutional: She is oriented to person, place, and time. She appears well-developed and well-nourished. No distress.  HENT:  Head: Normocephalic and atraumatic.  Cardiovascular: Normal rate, regular rhythm, normal heart sounds and intact distal pulses.   Pulmonary/Chest: Effort normal and breath sounds normal. No respiratory distress.  Abdominal: Bowel sounds are normal. She exhibits no distension. There is no tenderness.  Musculoskeletal:  Decreased ROM of right arm--unable to abduct above 90 degrees, tender in posterior upper arm and rotator cuff insertion  Neurological: She is alert and oriented to person, place, and time.  Skin: Skin is warm and dry. Capillary refill takes less than 2 seconds.  Dry skin  Psychiatric: She has a normal mood and affect.    Labs reviewed: Basic Metabolic Panel:  Recent Labs  11/05/16 1043 11/07/16 0952 02/07/17 0001  NA SEE NOTE 143 143  K SEE NOTE 3.8 3.8  CL SEE NOTE 106 107  CO2 SEE NOTE 26 26  GLUCOSE SEE NOTE 86 90  BUN SEE NOTE 13 13  CREATININE SEE NOTE 0.86 0.88  CALCIUM SEE NOTE 9.7 9.5  TSH  --  4.44  --    Liver Function Tests:  Recent Labs  11/05/16 1043 11/07/16 0952  AST SEE NOTE 21  ALT SEE NOTE 11  ALKPHOS SEE NOTE 76  BILITOT SEE NOTE 0.8  PROT SEE NOTE 7.1  ALBUMIN SEE NOTE 4.0   No results for input(s): LIPASE, AMYLASE in the last 8760 hours. No results for input(s): AMMONIA in the last 8760 hours. CBC:  Recent Labs  11/07/16 0952 02/07/17 0001  WBC 4.3 4.0  NEUTROABS 2,623 2,640  HGB 11.8 11.6*  HCT 37.5 37.3  MCV 92.1 93.7  PLT 198 173   Lipid  Panel:  Recent Labs  11/07/16 0952  CHOL 180  HDL 57  LDLCALC 105*  TRIG 91  CHOLHDL 3.2   Lab Results  Component Value Date   HGBA1C 5.0 11/09/2015    Procedures since last visit: Mm Diag Breast Tomo Bilateral  Result Date: 06/07/2017 CLINICAL DATA:  83 year old patient presents for annual exam. She has a family member with her. She was diagnosed with high-grade ductal carcinoma in situ with comedonecrosis and calcifications in the upper-outer quadrant of the left breast in May of 2017. She did not undergo surgery.Anastrozole was discontinued in June 2018 due to osteoporosis concerns. EXAM: 2D DIGITAL DIAGNOSTIC BILATERAL MAMMOGRAM WITH CAD AND ADJUNCT TOMO COMPARISON:  Previous exam(s). ACR Breast Density Category c: The breast tissue is heterogeneously dense, which may obscure small masses. FINDINGS: No mass or architectural distortion is identified. No suspicious microcalcification is seen in the right breast. Pleomorphic calcifications in the upper-outer quadrant of the left breast with central coil shaped biopsy clip are noted. This corresponds to known ductal carcinoma in situ. In the CC projection, there are new linearly oriented suspicious microcalcifications that are 2 cm directly lateral to the biopsied group. These calcifications were not present on the mammogram of 2017. These new calcifications project over the biopsied group of calcifications in the MLO projection. The biopsied group of calcifications measures approximately 2.0 cm greatest span in the CC projection, slightly increased from previous exam at which time they spanned 1.7 cm. There are 1 or 2 faint microcalcifications now visible anterior to the biopsied group of calcifications, in the slightly outer and retroareolar left breast. This is seen best in the CC projection. These microcalcifications are 2.3 cm anterior and medial to the biopsied group of calcifications. Mammographic images were processed with CAD. IMPRESSION:  Some progression of microcalcifications in the left breast. The biopsied group of calcifications  in the upper outer quadrant, shown to be high-grade ductal carcinoma in situ, measures slightly greater in size on today's examination. There are new suspicious microcalcifications visible 2 cm directly lateral to the biopsied group, as well as very faint microcalcifications now visible approximately 2 cm anterior to the biopsied group. No mass or architectural distortion is visualized. No evidence of malignancy in the right breast. RECOMMENDATION: Follow-up with Dr. Jana Hakim and follow-up diagnostic mammograms as desired. I have discussed the findings and recommendations with the patient. Results were also provided in writing at the conclusion of the visit. If applicable, a reminder letter will be sent to the patient regarding the next appointment. BI-RADS CATEGORY  6: Known biopsy-proven malignancy. Electronically Signed   By: Curlene Dolphin M.D.   On: 06/07/2017 11:58    Assessment/Plan 1. Vascular dementia without behavioral disturbance -progressing, seems safe in her home since she knows what to do in an emergency, does not wander, and sister and niece check on her regularly -MMSE 17/30 now -would be nice if she could get affordable CNA assistance, but cost is prohibitive -is incontinent of bladder, continent of bowel, able to give herself a spongebath, but needs help with shower  2. Ductal carcinoma in situ (DCIS) of left breast -left breast unchanged per pt, but mammogram showed possible enlargement in the affected area/microcalcifications -has not yet been reviewed by Dr. Jana Hakim to determine if further diagnostic mammo should be performed--pt had not heard about results just yet -given the fact that she is off the anastrazole and refuses further treatments, I'm not sure that doing more diagnostic studies is going to be too helpful  3. Hypothyroidism, postsurgical -cont current levothyroxine, f/u  lab next time  4. Essential hypertension, benign -bp at goal, cont same meds w/o change  5. Constipation, slow transit -does not like miralax so given high fiber diet and discussed fruit and veggie recommendations  6. Ataxia, late effect of cerebrovascular disease -cont use of walker  7. Tear of right rotator cuff, unspecified tear extent -seems that pain is worse here -she does not want PT and is doing some am exercise program on tv which sounds beneficial to maintain some ROM  Labs/tests ordered:  No orders of the defined types were placed in this encounter.  Next appt:  10/28/2017   Lela Gell L. Mycah Mcdougall, D.O. [facility] [address], [place] [address] Cell Phone (Mon-Fri 8am-5pm):  [phone] On Call:  [phone] & follow prompts after 5pm & weekends Office Phone:  [phone] Office Fax:  [phone]

## 2017-06-13 NOTE — Patient Instructions (Signed)
Ms. Shelia Marks , Thank you for taking time to come for your Medicare Wellness Visit. I appreciate your ongoing commitment to your health goals. Please review the following plan we discussed and let me know if I can assist you in the future.   Screening recommendations/referrals: Colonoscopy up to date, pt over age 81 Mammogram up to date, pt over age 79 Bone Density up to date Recommended yearly ophthalmology/optometry visit for glaucoma screening and checkup Recommended yearly dental visit for hygiene and checkup  Vaccinations: Influenza vaccine up to date, due 10/05/2017 Pneumococcal vaccine up to date Tdap vaccine due, if you want this vaccine we can put in prescription to your pharmacy Shingles vaccine due, if you want this vaccine we can put in prescription to your pharmacy  Advanced directives: DNR in chart  Conditions/risks identified: None  Next appointment: Dr Mariea Clonts 06/13/17 @ 9am   Preventive Care 65 Years and Older, Female Preventive care refers to lifestyle choices and visits with your health care provider that can promote health and wellness. What does preventive care include?  A yearly physical exam. This is also called an annual well check.  Dental exams once or twice a year.  Routine eye exams. Ask your health care provider how often you should have your eyes checked.  Personal lifestyle choices, including:  Daily care of your teeth and gums.  Regular physical activity.  Eating a healthy diet.  Avoiding tobacco and drug use.  Limiting alcohol use.  Practicing safe sex.  Taking low-dose aspirin every day.  Taking vitamin and mineral supplements as recommended by your health care provider. What happens during an annual well check? The services and screenings done by your health care provider during your annual well check will depend on your age, overall health, lifestyle risk factors, and family history of disease. Counseling  Your health care provider may  ask you questions about your:  Alcohol use.  Tobacco use.  Drug use.  Emotional well-being.  Home and relationship well-being.  Sexual activity.  Eating habits.  History of falls.  Memory and ability to understand (cognition).  Work and work Statistician.  Reproductive health. Screening  You may have the following tests or measurements:  Height, weight, and BMI.  Blood pressure.  Lipid and cholesterol levels. These may be checked every 5 years, or more frequently if you are over 57 years old.  Skin check.  Lung cancer screening. You may have this screening every year starting at age 69 if you have a 30-pack-year history of smoking and currently smoke or have quit within the past 15 years.  Fecal occult blood test (FOBT) of the stool. You may have this test every year starting at age 29.  Flexible sigmoidoscopy or colonoscopy. You may have a sigmoidoscopy every 5 years or a colonoscopy every 10 years starting at age 61.  Hepatitis C blood test.  Hepatitis B blood test.  Sexually transmitted disease (STD) testing.  Diabetes screening. This is done by checking your blood sugar (glucose) after you have not eaten for a while (fasting). You may have this done every 1-3 years.  Bone density scan. This is done to screen for osteoporosis. You may have this done starting at age 7.  Mammogram. This may be done every 1-2 years. Talk to your health care provider about how often you should have regular mammograms. Talk with your health care provider about your test results, treatment options, and if necessary, the need for more tests. Vaccines  Your health care  provider may recommend certain vaccines, such as:  Influenza vaccine. This is recommended every year.  Tetanus, diphtheria, and acellular pertussis (Tdap, Td) vaccine. You may need a Td booster every 10 years.  Zoster vaccine. You may need this after age 17.  Pneumococcal 13-valent conjugate (PCV13) vaccine. One  dose is recommended after age 73.  Pneumococcal polysaccharide (PPSV23) vaccine. One dose is recommended after age 18. Talk to your health care provider about which screenings and vaccines you need and how often you need them. This information is not intended to replace advice given to you by your health care provider. Make sure you discuss any questions you have with your health care provider. Document Released: 01/13/2016 Document Revised: 09/05/2016 Document Reviewed: 10/18/2015 Elsevier Interactive Patient Education  2017 [place] Prevention in the Home Falls can cause injuries. They can happen to people of all ages. There are many things you can do to make your home safe and to help prevent falls. What can I do on the outside of my home?  Regularly fix the edges of walkways and driveways and fix any cracks.  Remove anything that might make you trip as you walk through a door, such as a raised step or threshold.  Trim any bushes or trees on the path to your home.  Use bright outdoor lighting.  Clear any walking paths of anything that might make someone trip, such as rocks or tools.  Regularly check to see if handrails are loose or broken. Make sure that both sides of any steps have handrails.  Any raised decks and porches should have guardrails on the edges.  Have any leaves, snow, or ice cleared regularly.  Use sand or salt on walking paths during winter.  Clean up any spills in your garage right away. This includes oil or grease spills. What can I do in the bathroom?  Use night lights.  Install grab bars by the toilet and in the tub and shower. Do not use towel bars as grab bars.  Use non-skid mats or decals in the tub or shower.  If you need to sit down in the shower, use a plastic, non-slip stool.  Keep the floor dry. Clean up any water that spills on the floor as soon as it happens.  Remove soap buildup in the tub or shower regularly.  Attach bath  mats securely with double-sided non-slip rug tape.  Do not have throw rugs and other things on the floor that can make you trip. What can I do in the bedroom?  Use night lights.  Make sure that you have a light by your bed that is easy to reach.  Do not use any sheets or blankets that are too big for your bed. They should not hang down onto the floor.  Have a firm chair that has side arms. You can use this for support while you get dressed.  Do not have throw rugs and other things on the floor that can make you trip. What can I do in the kitchen?  Clean up any spills right away.  Avoid walking on wet floors.  Keep items that you use a lot in easy-to-reach places.  If you need to reach something above you, use a strong step stool that has a grab bar.  Keep electrical cords out of the way.  Do not use floor polish or wax that makes floors slippery. If you must use wax, use non-skid floor wax.  Do not have throw  rugs and other things on the floor that can make you trip. What can I do with my stairs?  Do not leave any items on the stairs.  Make sure that there are handrails on both sides of the stairs and use them. Fix handrails that are broken or loose. Make sure that handrails are as long as the stairways.  Check any carpeting to make sure that it is firmly attached to the stairs. Fix any carpet that is loose or worn.  Avoid having throw rugs at the top or bottom of the stairs. If you do have throw rugs, attach them to the floor with carpet tape.  Make sure that you have a light switch at the top of the stairs and the bottom of the stairs. If you do not have them, ask someone to add them for you. What else can I do to help prevent falls?  Wear shoes that:  Do not have high heels.  Have rubber bottoms.  Are comfortable and fit you well.  Are closed at the toe. Do not wear sandals.  If you use a stepladder:  Make sure that it is fully opened. Do not climb a closed  stepladder.  Make sure that both sides of the stepladder are locked into place.  Ask someone to hold it for you, if possible.  Clearly mark and make sure that you can see:  Any grab bars or handrails.  First and last steps.  Where the edge of each step is.  Use tools that help you move around (mobility aids) if they are needed. These include:  Canes.  Walkers.  Scooters.  Crutches.  Turn on the lights when you go into a dark area. Replace any light bulbs as soon as they burn out.  Set up your furniture so you have a clear path. Avoid moving your furniture around.  If any of your floors are uneven, fix them.  If there are any pets around you, be aware of where they are.  Review your medicines with your doctor. Some medicines can make you feel dizzy. This can increase your chance of falling. Ask your doctor what other things that you can do to help prevent falls. This information is not intended to replace advice given to you by your health care provider. Make sure you discuss any questions you have with your health care provider. Document Released: 10/13/2009 Document Revised: 05/24/2016 Document Reviewed: 01/21/2015 Elsevier Interactive Patient Education  2017 [employer].

## 2017-07-14 ENCOUNTER — Other Ambulatory Visit: Payer: Self-pay | Admitting: Internal Medicine

## 2017-08-05 DIAGNOSIS — Z961 Presence of intraocular lens: Secondary | ICD-10-CM | POA: Diagnosis not present

## 2017-09-18 ENCOUNTER — Ambulatory Visit: Payer: Medicare Other | Admitting: Nurse Practitioner

## 2017-10-17 ENCOUNTER — Encounter (INDEPENDENT_AMBULATORY_CARE_PROVIDER_SITE_OTHER): Payer: Self-pay | Admitting: Orthopedic Surgery

## 2017-10-17 ENCOUNTER — Ambulatory Visit (INDEPENDENT_AMBULATORY_CARE_PROVIDER_SITE_OTHER): Payer: Medicare Other

## 2017-10-17 ENCOUNTER — Ambulatory Visit (INDEPENDENT_AMBULATORY_CARE_PROVIDER_SITE_OTHER): Payer: Medicare Other | Admitting: Orthopedic Surgery

## 2017-10-17 DIAGNOSIS — G8929 Other chronic pain: Secondary | ICD-10-CM

## 2017-10-17 DIAGNOSIS — M25562 Pain in left knee: Secondary | ICD-10-CM

## 2017-10-17 DIAGNOSIS — M25511 Pain in right shoulder: Secondary | ICD-10-CM | POA: Diagnosis not present

## 2017-10-19 DIAGNOSIS — M25562 Pain in left knee: Secondary | ICD-10-CM | POA: Diagnosis not present

## 2017-10-19 DIAGNOSIS — M25511 Pain in right shoulder: Secondary | ICD-10-CM

## 2017-10-19 DIAGNOSIS — G8929 Other chronic pain: Secondary | ICD-10-CM

## 2017-10-19 MED ORDER — BUPIVACAINE HCL 0.5 % IJ SOLN
9.0000 mL | INTRAMUSCULAR | Status: AC | PRN
  Administered 2017-10-19: 9 mL via INTRA_ARTICULAR

## 2017-10-19 MED ORDER — METHYLPREDNISOLONE ACETATE 40 MG/ML IJ SUSP
40.0000 mg | INTRAMUSCULAR | Status: AC | PRN
  Administered 2017-10-19: 40 mg via INTRA_ARTICULAR

## 2017-10-19 MED ORDER — BUPIVACAINE HCL 0.25 % IJ SOLN
4.0000 mL | INTRAMUSCULAR | Status: AC | PRN
  Administered 2017-10-19: 4 mL via INTRA_ARTICULAR

## 2017-10-19 MED ORDER — LIDOCAINE HCL 1 % IJ SOLN
5.0000 mL | INTRAMUSCULAR | Status: AC | PRN
Start: 2017-10-19 — End: 2017-10-19
  Administered 2017-10-19: 5 mL

## 2017-10-19 MED ORDER — LIDOCAINE HCL 1 % IJ SOLN
5.0000 mL | INTRAMUSCULAR | Status: AC | PRN
  Administered 2017-10-19: 5 mL

## 2017-10-19 NOTE — Progress Notes (Signed)
Office Visit Note   Patient: Shelia Marks           Date of Birth: [birth year]           MRN: [id number] Visit Date: 10/17/2017 Requested by: Gayland Curry, DO [place], [place] [address] PCP: Gayland Curry, DO  Subjective: Chief Complaint  Patient presents with  . Left Knee - Pain  . Right Knee - Pain  . Right Shoulder - Pain    HPI: Shelia Marks is an 83 year old patient with bilateral knee pain left worse than right and right shoulder pain. She denies any history of injury to either.  She uses a cane for ambulation  The pain will wake her from sleep at nigh. Injection has helped the left knee in the past.t              ROS: All systems reviewed are negative as they relate to the chief complaint within the history of present illness.  Patient denies  fevers or chills.   Assessment & Plan: Visit Diagnoses:  1. Chronic right shoulder pain   2. Chronic pain of left knee     Plan: impression isleft shoulder arthritis and right shoulder rotator cuff arthropathy.  Patient does not desire surgery.  Plan is in  Follow-up as needed  Follow-Up Instructions: Return if symptoms worsen or fail to improve.   Orders:  Orders Placed This Encounter  Procedures  . XR Shoulder Right  . XR Knee 1-2 Views Left   No orders of the defined types were placed in this encounter.     Procedures: Large Joint Inj Date/Time: 10/19/2017 3:41 PM Performed by: Meredith Pel Authorized by: Meredith Pel   Consent Given by:  Patient Site marked: the procedure site was marked   Timeout: prior to procedure the correct patient, procedure, and site was verified   Indications:  Pain, joint swelling and diagnostic evaluation Location:  Knee Site:  L knee Prep: patient was prepped and draped in usual sterile fashion   Needle Size:  18 G Needle Length:  1.5 inches Approach:  Superolateral Ultrasound Guidance: No   Fluoroscopic Guidance: No   Arthrogram: No     Medications:  5 mL lidocaine 1 %; 4 mL bupivacaine 0.25 %; 40 mg methylPREDNISolone acetate 40 MG/ML Patient tolerance:  Patient tolerated the procedure well with no immediate complications Large Joint Inj Date/Time: 10/19/2017 3:41 PM Performed by: Meredith Pel Authorized by: Meredith Pel   Consent Given by:  Patient Site marked: the procedure site was marked   Timeout: prior to procedure the correct patient, procedure, and site was verified   Indications:  Pain and diagnostic evaluation Location:  Shoulder Site:  R glenohumeral Prep: patient was prepped and draped in usual sterile fashion   Needle Size:  18 G Needle Length:  1.5 inches Approach:  Posterior Ultrasound Guidance: No   Fluoroscopic Guidance: No   Arthrogram: No   Medications:  5 mL lidocaine 1 %; 9 mL bupivacaine 0.5 %; 40 mg methylPREDNISolone acetate 40 MG/ML Aspiration Attempted: No   Patient tolerance:  Patient tolerated the procedure well with no immediate complications     Clinical Data: No additional findings.  Objective: Vital Signs: There were no vitals taken for this visit.  Physical Exam:   Constitutional: Patient appears well-developed HEENT:  Head: Normocephalic Eyes:EOM are normal Neck: Normal range of motion Cardiovascular: Normal rate Pulmonary/chest: Effort normal Neurologic: Patient is alert Skin: Skin is warm  Psychiatric: Patient has normal mood and affect    Ortho Exam: orthopedic exam demonstrates about 50 of active forward flexion and abduction on the right-hand sidecoarseness is present with passive range of motion.  Left knee has transverse incision scar and absent patella.  Joint line tenderness is present.  Pedal pulses palpable.  No groin pain with internal and external rotation of the leg  Specialty Comments:  No specialty comments available.  Imaging: No results found.   PMFS History: Patient Active Problem List   Diagnosis Date Noted  . Advanced  care planning/counseling discussion 02/07/2017  . Osteoarthritis of left knee 11/05/2016  . Loss of weight 06/01/2016  . Depression due to stroke 06/01/2016  . Vascular dementia 06/01/2016  . Ductal carcinoma in situ (DCIS) of left breast 05/21/2016  . Ataxia, late effect of cerebrovascular disease 04/23/2014  . Right pontine stroke ([facility]) 04/18/2014  . Hypothyroidism, postsurgical 04/18/2014  . Dysphagia as late effect of stroke 04/18/2014  . Essential hypertension, benign 04/18/2014  . Constipation, slow transit 04/18/2014  . Thrombotic cerebral infarction ([facility]) 03/08/2014  . Senile osteoporosis   . Hyperlipidemia LDL goal < 100    Past Medical History:  Diagnosis Date  . Alzheimer's disease   . Cancer ([facility])   . Disorder of bone and cartilage, unspecified   . Dizziness and giddiness   . Hyperlipidemia LDL goal < 100   . Hypertension   . Hypothyroidism   . Malignant neoplasm of thyroid gland ([facility])   . Nontoxic uninodular goiter   . Other malaise and fatigue   . Other symptoms involving cardiovascular system   . Phlebitis and thrombophlebitis of unspecified site   . Senile osteoporosis   . Sleep related leg cramps   . Stroke ([facility])   . Unspecified disorder of lipoid metabolism   . Unspecified hereditary and idiopathic peripheral neuropathy   . Varicose veins of lower extremities with inflammation     Family History  Problem Relation Age of Onset  . Diabetes Brother     Past Surgical History:  Procedure Laterality Date  . ABDOMINAL HYSTERECTOMY     DR HUGES  . BREAST BIOPSY Left 05/21/2016   malignant  . CATRACT SURGERY  2008  . COLON SURGERY    . KNEE SURGERY     right  . PARATHYROID ADENOMA REMOVED  1983  . THYROID SURGERY  July 20, 2011   Social History   Occupational History  . Not on file.   Social History Main Topics  . Smoking status: Never Smoker  . Smokeless tobacco: Never Used  . Alcohol use No  . Drug use: No  . Sexual activity: Not on file

## 2017-10-28 ENCOUNTER — Ambulatory Visit (INDEPENDENT_AMBULATORY_CARE_PROVIDER_SITE_OTHER): Payer: Medicare Other | Admitting: Internal Medicine

## 2017-10-28 ENCOUNTER — Encounter: Payer: Self-pay | Admitting: Internal Medicine

## 2017-10-28 VITALS — BP 142/80 | HR 66 | Temp 98.7°F | Wt 159.0 lb

## 2017-10-28 DIAGNOSIS — I10 Essential (primary) hypertension: Secondary | ICD-10-CM | POA: Diagnosis not present

## 2017-10-28 DIAGNOSIS — F0150 Vascular dementia without behavioral disturbance: Secondary | ICD-10-CM

## 2017-10-28 DIAGNOSIS — E890 Postprocedural hypothyroidism: Secondary | ICD-10-CM

## 2017-10-28 DIAGNOSIS — K5901 Slow transit constipation: Secondary | ICD-10-CM

## 2017-10-28 DIAGNOSIS — Z23 Encounter for immunization: Secondary | ICD-10-CM | POA: Diagnosis not present

## 2017-10-28 DIAGNOSIS — I6300 Cerebral infarction due to thrombosis of unspecified precerebral artery: Secondary | ICD-10-CM

## 2017-10-28 DIAGNOSIS — D0512 Intraductal carcinoma in situ of left breast: Secondary | ICD-10-CM

## 2017-10-28 NOTE — Progress Notes (Signed)
Location:   [facility]:   Clinic Provider: Dany Harten L. Mariea Clonts, D.O., C.M.D.  Code Status: DNR Goals of Care:  Advanced Directives 06/13/2017  Does Patient Have a Medical Advance Directive? Yes  Type of Advance Directive Out of facility DNR (pink MOST or yellow form)  Does patient want to make changes to medical advance directive? No - Patient declined  Copy of [place] in Chart? -  Would patient like information on creating a medical advance directive? -  Pre-existing out of facility DNR order (yellow form or pink MOST form) Yellow form placed in chart (order not valid for inpatient use)     Chief Complaint  Patient presents with  . Medical Management of Chronic Issues    53mth follow-up    HPI: Patient is a 83 y.o. female seen today for medical management of chronic diseases.    Hypertension- BP today 142/80 on Atenolol, Amlodipine, lisinopril. No CP, headaches or shortness of breath.  Constipation-Takes milk of magnesia as needed. Has some Miralax but does not take it. Says she has a BM about 2 times per week. Not sure about diet. Discussed higher fiber diet and maybe supplementing with prunes or prune juice.  Gout- Says her allopurinol does "pretty good"   Levothyroxine- No complaints, currently managed on levothyroxine. TSH 1.36 in May.   Dementia-says she forgets from time to time but in general doing OK. Stills lives alone with assistance from her sister. Is well groomed today and has recall of recent events. Last MMSE 17/30 Failed clock  Depression-Zoloft 50mg  daily. Says she sleeps ok and has been doing ok with her mood.   Osteoporosis- On Fosamax  Gait abnormality- Using a walker today but normally uses a cane. Sees Dr Marlou Sa with orthopedics. Gets cortisone shots in her knee and shoulder.  Breast Cancer- Has an appointment for Mammogram in November being followed by Dr Jana Hakim.     Flu shot today   Past Medical History:  Diagnosis  Date  . Alzheimer's disease   . Cancer ([facility])   . Disorder of bone and cartilage, unspecified   . Dizziness and giddiness   . Hyperlipidemia LDL goal < 100   . Hypertension   . Hypothyroidism   . Malignant neoplasm of thyroid gland ([facility])   . Nontoxic uninodular goiter   . Other malaise and fatigue   . Other symptoms involving cardiovascular system   . Phlebitis and thrombophlebitis of unspecified site   . Senile osteoporosis   . Sleep related leg cramps   . Stroke ([facility])   . Unspecified disorder of lipoid metabolism   . Unspecified hereditary and idiopathic peripheral neuropathy   . Varicose veins of lower extremities with inflammation     Past Surgical History:  Procedure Laterality Date  . ABDOMINAL HYSTERECTOMY     DR HUGES  . BREAST BIOPSY Left 05/21/2016   malignant  . CATRACT SURGERY  2008  . COLON SURGERY    . KNEE SURGERY     right  . PARATHYROID ADENOMA REMOVED  1983  . THYROID SURGERY  July 20, 2011    No Known Allergies  Outpatient Encounter Prescriptions as of 10/28/2017  Medication Sig  . alendronate (FOSAMAX) 70 MG tablet Take 1 tablet (70 mg total) by mouth every 7 (seven) days. Take with a full glass of water on an empty stomach.  Marland Kitchen allopurinol (ZYLOPRIM) 300 MG tablet Take 1 tablet (300 mg total) by mouth daily.  Marland Kitchen amLODipine (  NORVASC) 10 MG tablet Take 1 tablet (10 mg total) by mouth daily.  Marland Kitchen aspirin EC 81 MG tablet Take 1 tablet (81 mg total) by mouth daily.  Marland Kitchen atenolol (TENORMIN) 25 MG tablet TAKE 1 TABLET (25 MG TOTAL) BY MOUTH DAILY.  Marland Kitchen atorvastatin (LIPITOR) 40 MG tablet Take 1 tablet (40 mg total) by mouth daily.  . benzonatate (TESSALON PERLES) 100 MG capsule Take one capsule by mouth at bedtime as needed for cough  . calcium carbonate (TUMS - DOSED IN MG ELEMENTAL CALCIUM) 500 MG chewable tablet Chew 1 tablet by mouth as needed for indigestion or heartburn.  . Cholecalciferol (VITAMIN D) 2000 units CAPS Take 1 capsule (2,000 Units total) by  mouth daily.  . iron polysaccharides (NIFEREX) 150 MG capsule Take 1 capsule (150 mg total) by mouth every other day.  . levothyroxine (SYNTHROID, LEVOTHROID) 112 MCG tablet Take 112 mcg by mouth daily before breakfast. RX'ed Dr.Balan  . lisinopril (PRINIVIL,ZESTRIL) 2.5 MG tablet Take one tablet by mouth once daily to control blood pressure  . polyethylene glycol powder (GLYCOLAX/MIRALAX) powder 17 grams in 8oz of water or juice now and if no Bowel Movement by bedtime, repeat Miralax dose. If this is unsuccessful call office.  . sertraline (ZOLOFT) 50 MG tablet Take one tablet by mouth once daily for depression   No facility-administered encounter medications on file as of 10/28/2017.     Review of Systems:  Review of Systems  Constitutional: Negative for chills and fever.  HENT: Positive for hearing loss.   Eyes:       Glasses  Respiratory: Negative for cough, sputum production and shortness of breath.   Cardiovascular: Negative for chest pain, palpitations and leg swelling.  Gastrointestinal: Positive for constipation.  Genitourinary:       Incontinence   Musculoskeletal: Positive for joint pain.  Neurological: Negative for tingling, weakness and headaches.  Psychiatric/Behavioral: Positive for memory loss. Negative for depression. The patient does not have insomnia.     Health Maintenance  Topic Date Due  . TETANUS/TDAP  12/08/1945  . INFLUENZA VACCINE  07/31/2017  . DEXA SCAN  Completed  . PNA vac Low Risk Adult  Completed    Physical Exam: Vitals:   10/28/17 1314  BP: (!) 142/80  Pulse: 66  Temp: 98.7 F (37.1 C)  TempSrc: Oral  SpO2: 99%  Weight: 159 lb (72.1 kg)   Body mass index is 30.04 kg/m. Physical Exam  Constitutional: She is oriented to person, place, and time. She appears well-developed and well-nourished.  Eyes:  Glasses  Cardiovascular: Normal rate, regular rhythm and normal heart sounds.   Pulmonary/Chest: Effort normal and breath sounds normal.    Abdominal: Soft. Bowel sounds are normal.  Neurological: She is alert and oriented to person, place, and time.  Skin: Skin is warm and dry. Capillary refill takes less than 2 seconds.  Psychiatric: She has a normal mood and affect. Her behavior is normal. Judgment and thought content normal. Cognition and memory are impaired.  Vitals reviewed.   Labs reviewed: Basic Metabolic Panel:  Recent Labs  11/05/16 1043 11/07/16 0952 02/07/17 0001  NA SEE NOTE 143 143  K SEE NOTE 3.8 3.8  CL SEE NOTE 106 107  CO2 SEE NOTE 26 26  GLUCOSE SEE NOTE 86 90  BUN SEE NOTE 13 13  CREATININE SEE NOTE 0.86 0.88  CALCIUM SEE NOTE 9.7 9.5  TSH  --  4.44  --    Liver Function Tests:  Recent Labs  11/05/16 1043 11/07/16 0952  AST SEE NOTE 21  ALT SEE NOTE 11  ALKPHOS SEE NOTE 76  BILITOT SEE NOTE 0.8  PROT SEE NOTE 7.1  ALBUMIN SEE NOTE 4.0   No results for input(s): LIPASE, AMYLASE in the last 8760 hours. No results for input(s): AMMONIA in the last 8760 hours. CBC:  Recent Labs  11/07/16 0952 02/07/17 0001  WBC 4.3 4.0  NEUTROABS 2,623 2,640  HGB 11.8 11.6*  HCT 37.5 37.3  MCV 92.1 93.7  PLT 198 173   Lipid Panel:  Recent Labs  11/07/16 0952  CHOL 180  HDL 57  LDLCALC 105*  TRIG 91  CHOLHDL 3.2   Lab Results  Component Value Date   HGBA1C 5.0 11/09/2015    Procedures since last visit: Xr Knee 1-2 Views Left  Result Date: 10/19/2017 AP lateral left knee reviewed.  Tricompartmental arthritis is present.  Joint space absent.  Patella absent.  Xr Shoulder Right  Result Date: 10/19/2017 AP outlet and axillary lateral right shoulder reviewed. Rotator cuff arthropathy is noted. No fracture or dislocation present.  Bones are osteopenic.   Assessment/Plan 1. Essential hypertension, benign Satisfactory for her age and function, Continue Atenolol 25mg , amplodipine 10mg  and lisinopril  daily   2. Cerebral infarction due to thrombosis of precerebral artery  (HCC) Continue to manage BP and aspirin daily  3. Constipation, slow transit Increase fiber in diet, prunes  4. Hypothyroidism, postsurgical Follow up TSH in May, cont current levothyroxine therapy  5. Ductal carcinoma in situ (DCIS) of left breast Follow up with Dr Marjie Skiff on mammogram, would favor stopping mammograms since pt is clear she wants no intervention for any findings on mammograms  6. Vascular dementia without behavioral disturbance Appreciate help from sister who provides meals and assists a bit with history, brings her to appts and helps with medications -Pt still lives independently  Flu shot given  Labs/tests ordered:   Orders Placed This Encounter  Procedures  . Flu vaccine HIGH DOSE PF (Fluzone High dose)  . CBC with Differential/Platelet    Standing Status:   Future    Standing Expiration Date:   06/28/2018  . COMPLETE METABOLIC PANEL WITH GFR    Standing Status:   Future    Standing Expiration Date:   06/28/2018  . Lipid panel    Standing Status:   Future    Standing Expiration Date:   06/28/2018  . TSH    Standing Status:   Future    Standing Expiration Date:   06/28/2018   Next appt: 02/27/2018 med mgt, labs before   Conlan Miceli L. Areeb Corron, D.O. [facility] [address], [place] [address] Cell Phone (Mon-Fri 8am-5pm):  [phone] On Call:  [phone] & follow prompts after 5pm & weekends Office Phone:  [phone] Office Fax:  [phone]

## 2017-11-11 ENCOUNTER — Ambulatory Visit
Admission: RE | Admit: 2017-11-11 | Discharge: 2017-11-11 | Disposition: A | Discharge status: Home / Self Care | Payer: Medicare Other | Source: Ambulatory Visit | Attending: Oncology | Admitting: Oncology

## 2017-11-11 DIAGNOSIS — D0512 Intraductal carcinoma in situ of left breast: Secondary | ICD-10-CM

## 2017-11-11 DIAGNOSIS — R928 Other abnormal and inconclusive findings on diagnostic imaging of breast: Secondary | ICD-10-CM | POA: Diagnosis not present

## 2018-02-18 ENCOUNTER — Other Ambulatory Visit: Payer: Self-pay | Admitting: Internal Medicine

## 2018-02-25 ENCOUNTER — Other Ambulatory Visit: Payer: Medicare Other

## 2018-02-25 DIAGNOSIS — I10 Essential (primary) hypertension: Secondary | ICD-10-CM

## 2018-02-25 DIAGNOSIS — F0150 Vascular dementia without behavioral disturbance: Secondary | ICD-10-CM | POA: Diagnosis not present

## 2018-02-25 DIAGNOSIS — E890 Postprocedural hypothyroidism: Secondary | ICD-10-CM

## 2018-02-25 DIAGNOSIS — I6300 Cerebral infarction due to thrombosis of unspecified precerebral artery: Secondary | ICD-10-CM | POA: Diagnosis not present

## 2018-02-25 DIAGNOSIS — D0512 Intraductal carcinoma in situ of left breast: Secondary | ICD-10-CM

## 2018-02-25 LAB — LIPID PANEL
Cholesterol: 151 mg/dL (ref <200)
HDL: 50 mg/dL — ABNORMAL LOW (ref >50)
LDL Cholesterol (Calc): 85 mg/dL (calc)
Non-HDL Cholesterol (Calc): 101 mg/dL (calc) (ref <130)
Total CHOL/HDL Ratio: 3 (calc) (ref <5.0)
Triglycerides: 74 mg/dL (ref <150)

## 2018-02-25 LAB — COMPLETE METABOLIC PANEL WITH GFR
AG Ratio: 1.5 (calc) (ref 1.0–2.5)
ALT: 19 U/L (ref 6–29)
AST: 27 U/L (ref 10–35)
Albumin: 4.1 g/dL (ref 3.6–5.1)
Alkaline phosphatase (APISO): 54 U/L (ref 33–130)
BUN/Creatinine Ratio: 19 (calc) (ref 6–22)
BUN: 19 mg/dL (ref 7–25)
CO2: 29 mmol/L (ref 20–32)
Calcium: 9.6 mg/dL (ref 8.6–10.4)
Chloride: 107 mmol/L (ref 98–110)
Creat: 0.99 mg/dL — ABNORMAL HIGH (ref 0.60–0.88)
GFR, Est African American: 59 mL/min/{1.73_m2} — ABNORMAL LOW (ref >=60)
GFR, Est Non African American: 51 mL/min/{1.73_m2} — ABNORMAL LOW (ref >=60)
Globulin: 2.8 g/dL (calc) (ref 1.9–3.7)
Glucose, Bld: 91 mg/dL (ref 65–99)
Potassium: 3.9 mmol/L (ref 3.5–5.3)
Sodium: 143 mmol/L (ref 135–146)
Total Bilirubin: 0.8 mg/dL (ref 0.2–1.2)
Total Protein: 6.9 g/dL (ref 6.1–8.1)

## 2018-02-25 LAB — CBC WITH DIFFERENTIAL/PLATELET
Basophils Absolute: 31 cells/uL (ref 0–200)
Basophils Relative: 0.9 %
Eosinophils Absolute: 109 cells/uL (ref 15–500)
Eosinophils Relative: 3.2 %
HCT: 34.2 % — ABNORMAL LOW (ref 35.0–45.0)
Hemoglobin: 11.3 g/dL — ABNORMAL LOW (ref 11.7–15.5)
Lymphs Abs: 959 cells/uL (ref 850–3900)
MCH: 30.1 pg (ref 27.0–33.0)
MCHC: 33 g/dL (ref 32.0–36.0)
MCV: 91.2 fL (ref 80.0–100.0)
MPV: 12.5 fL (ref 7.5–12.5)
Monocytes Relative: 7.4 %
Neutro Abs: 2050 cells/uL (ref 1500–7800)
Neutrophils Relative %: 60.3 %
Platelets: 174 10*3/uL (ref 140–400)
RBC: 3.75 10*6/uL — ABNORMAL LOW (ref 3.80–5.10)
RDW: 13.8 % (ref 11.0–15.0)
Total Lymphocyte: 28.2 %
WBC mixed population: 252 cells/uL (ref 200–950)
WBC: 3.4 10*3/uL — ABNORMAL LOW (ref 3.8–10.8)

## 2018-02-25 LAB — TSH: TSH: 2.61 mIU/L (ref 0.40–4.50)

## 2018-02-27 ENCOUNTER — Ambulatory Visit (INDEPENDENT_AMBULATORY_CARE_PROVIDER_SITE_OTHER): Payer: Medicare Other | Admitting: Internal Medicine

## 2018-02-27 ENCOUNTER — Encounter: Payer: Self-pay | Admitting: Internal Medicine

## 2018-02-27 VITALS — BP 148/78 | HR 81 | Temp 98.9°F | Ht 61.0 in | Wt 162.0 lb

## 2018-02-27 DIAGNOSIS — F0150 Vascular dementia without behavioral disturbance: Secondary | ICD-10-CM | POA: Diagnosis not present

## 2018-02-27 DIAGNOSIS — W19XXXA Unspecified fall, initial encounter: Secondary | ICD-10-CM | POA: Diagnosis not present

## 2018-02-27 DIAGNOSIS — I69993 Ataxia following unspecified cerebrovascular disease: Secondary | ICD-10-CM | POA: Diagnosis not present

## 2018-02-27 DIAGNOSIS — M1732 Unilateral post-traumatic osteoarthritis, left knee: Secondary | ICD-10-CM | POA: Diagnosis not present

## 2018-02-27 DIAGNOSIS — I10 Essential (primary) hypertension: Secondary | ICD-10-CM

## 2018-02-27 DIAGNOSIS — I6300 Cerebral infarction due to thrombosis of unspecified precerebral artery: Secondary | ICD-10-CM

## 2018-02-27 DIAGNOSIS — K5901 Slow transit constipation: Secondary | ICD-10-CM

## 2018-02-27 DIAGNOSIS — M810 Age-related osteoporosis without current pathological fracture: Secondary | ICD-10-CM

## 2018-02-27 NOTE — Patient Instructions (Signed)
Drink more water. 8 cups of water daily is goal  [employer] (large cup) drink 3 cups of water throughout the day  Family to work on getting "life alert/great call button"  Home health come out to do a house surveillance and add tennis balls for walker

## 2018-02-27 NOTE — Progress Notes (Signed)
Location:  [facility] clinic Provider:  Tiffany L. Mariea Clonts, D.O., C.M.D.  Code Status: DNR Goals of Care:  Advanced Directives 06/13/2017  Does Patient Have a Medical Advance Directive? Yes  Type of Advance Directive Out of facility DNR (pink MOST or yellow form)  Does patient want to make changes to medical advance directive? No - Patient declined  Copy of [place] in Chart? -  Would patient like information on creating a medical advance directive? -  Pre-existing out of facility DNR order (yellow form or pink MOST form) Yellow form placed in chart (order not valid for inpatient use)     Chief Complaint  Patient presents with  . Medical Management of Chronic Issues    4 month med mgt  . Medication Refill    No Refills     HPI: Patient is a 84 y.o. female seen today for medical management of chronic diseases.  Accompanied today by her sister. Reports sleeping well. Reports getting up to use the restroom a few times a night. Eats well, chicken is one of her favorites. Has dentures and sometimes they don't stay in place, denies trouble with chewing. Denies trouble with swallowing. Wears depends for "accidents" Denies trouble with BMs, uses miralax to help with constipation. Reports a fall, since last visit to office in Oct. She was walking to her bathroom with her cane when she fell. Today in office she has a front wheel walker. She reports that she has good and bad days with her memory. She lives alone (asked if she should remarry for company). But sister checks on her daily she says. She also mentions a "help button" and ask if this would be good for her to get. She further reports her "skin is as wrinkly as a can of kraut" and reports using olive oil to help this.  HTN: Reports taking her medication as directed.   Dementia: She reports trouble recalling something, but she thinks she is managing on her own.   Osteoporosis: She is on Fosamax and reports not liking to take  it. But states she takes it every 7 days.   CVA: History of stroke, has on-going right arm weakness and difficulty walking. She does not recall the CVA is what caused this.    Past Medical History:  Diagnosis Date  . Alzheimer's disease   . Cancer ([place])   . Disorder of bone and cartilage, unspecified   . Dizziness and giddiness   . Hyperlipidemia LDL goal < 100   . Hypertension   . Hypothyroidism   . Malignant neoplasm of thyroid gland ([place])   . Nontoxic uninodular goiter   . Other malaise and fatigue   . Other symptoms involving cardiovascular system   . Phlebitis and thrombophlebitis of unspecified site   . Senile osteoporosis   . Sleep related leg cramps   . Stroke ([place])   . Unspecified disorder of lipoid metabolism   . Unspecified hereditary and idiopathic peripheral neuropathy   . Varicose veins of lower extremities with inflammation     Past Surgical History:  Procedure Laterality Date  . ABDOMINAL HYSTERECTOMY     DR HUGES  . BREAST BIOPSY Left 05/21/2016   malignant  . CATRACT SURGERY  2008  . COLON SURGERY    . KNEE SURGERY     right  . PARATHYROID ADENOMA REMOVED  1983  . THYROID SURGERY  July 20, 2011    No Known Allergies  Outpatient Encounter Medications as of  02/27/2018  Medication Sig  . alendronate (FOSAMAX) 70 MG tablet TAKE 1 TABLET EVERY SEVEN DAYS. TAKE WITH A FULL GLASS OF WATER ON AN EMPTY STOMACH.  Marland Kitchen allopurinol (ZYLOPRIM) 300 MG tablet TAKE 1 TABLET ONE TIME DAILY  . amLODipine (NORVASC) 10 MG tablet TAKE 1 TABLET ONE TIME DAILY  . aspirin EC 81 MG tablet Take 1 tablet (81 mg total) by mouth daily.  Marland Kitchen atenolol (TENORMIN) 25 MG tablet TAKE 1 TABLET EVERY DAY  . atorvastatin (LIPITOR) 40 MG tablet TAKE 1 TABLET EVERY DAY  . benzonatate (TESSALON PERLES) 100 MG capsule Take one capsule by mouth at bedtime as needed for cough  . calcium carbonate (TUMS - DOSED IN MG ELEMENTAL CALCIUM) 500 MG chewable tablet Chew 1 tablet by mouth as needed for  indigestion or heartburn.  . Cholecalciferol (VITAMIN D) 2000 units CAPS Take 1 capsule (2,000 Units total) by mouth daily.  . iron polysaccharides (NIFEREX) 150 MG capsule Take 1 capsule (150 mg total) by mouth every other day.  . levothyroxine (SYNTHROID, LEVOTHROID) 112 MCG tablet Take 112 mcg by mouth daily before breakfast. RX'ed Dr.Balan  . lisinopril (PRINIVIL,ZESTRIL) 2.5 MG tablet TAKE ONE TABLET DAILY TO CONTROL BLOOD PRESSURE  . polyethylene glycol powder (GLYCOLAX/MIRALAX) powder 17 grams in 8oz of water or juice now and if no Bowel Movement by bedtime, repeat Miralax dose. If this is unsuccessful call office.  . sertraline (ZOLOFT) 50 MG tablet TAKE ONE TABLET DAILY FOR DEPRESSION   No facility-administered encounter medications on file as of 02/27/2018.     Review of Systems:  Review of Systems  Constitutional: Negative for chills, fever and malaise/fatigue.  HENT: Positive for hearing loss.   Eyes:       Wears glasses  Respiratory: Negative for cough and shortness of breath.   Cardiovascular: Negative for chest pain, palpitations and leg swelling.  Gastrointestinal: Positive for constipation. Negative for blood in stool and heartburn.  Genitourinary: Positive for frequency. Negative for hematuria.       Incontinent-bladder  Musculoskeletal: Positive for myalgias.       Right arm pain-"going on for years"  Psychiatric/Behavioral: Positive for depression and memory loss. The patient is not nervous/anxious and does not have insomnia.     Health Maintenance  Topic Date Due  . TETANUS/TDAP  12/08/1945  . INFLUENZA VACCINE  Completed  . DEXA SCAN  Completed  . PNA vac Low Risk Adult  Completed    Physical Exam: Vitals:   02/27/18 1436  BP: (!) 148/78  Pulse: 81  Temp: 98.9 F (37.2 C)  SpO2: 98%  Weight: 162 lb (73.5 kg)  Height: 5\' 1"  (1.549 m)   Body mass index is 30.61 kg/m. Physical Exam  Constitutional: She appears well-developed and well-nourished.    Cardiovascular: Normal rate, regular rhythm, normal heart sounds and intact distal pulses.  Pulmonary/Chest: Effort normal and breath sounds normal.  Abdominal: Soft. Bowel sounds are normal.  Musculoskeletal: Normal range of motion.  Front wheel walker present today  Neurological: She is alert.  Cognitive impairment  Skin: Skin is warm and dry.  Psychiatric: She has a normal mood and affect. Her behavior is normal.  Vitals reviewed.   Labs reviewed: Basic Metabolic Panel: Recent Labs    02/25/18 0937  NA 143  K 3.9  CL 107  CO2 29  GLUCOSE 91  BUN 19  CREATININE 0.99*  CALCIUM 9.6  TSH 2.61   Liver Function Tests: Recent Labs    02/25/18 1610  AST 27  ALT 19  BILITOT 0.8  PROT 6.9   No results for input(s): LIPASE, AMYLASE in the last 8760 hours. No results for input(s): AMMONIA in the last 8760 hours. CBC: Recent Labs    02/25/18 0937  WBC 3.4*  NEUTROABS 2,050  HGB 11.3*  HCT 34.2*  MCV 91.2  PLT 174   Lipid Panel: Recent Labs    02/25/18 0937  CHOL 151  HDL 50*  TRIG 74  CHOLHDL 3.0   Lab Results  Component Value Date   HGBA1C 5.0 11/09/2015    Procedures since last visit: No results found.  Assessment/Plan  1. Essential hypertension, benign BP is slightly elevated today, concern if she is taking her medication as ordered, or if her cognitive decline is now creating an issue with this. She reports taking as prescribed. Will order HH to assess in more detail.  - Ambulatory referral to [facility]  2. Vascular dementia without behavioral disturbance She has dementia and a MMSE 05/2017 score 17/30, down from previous year of 23/30 (05/2016). In communication she easily loses train of thought, but demonstrates understanding of commands and most questions. Though she inappropriately answers some of the questions. She complains of arm discomfort, but forgets her CVA caused this. Express our concern to her and her sister about living alone. She  reports that she feels safe, but would like a life alert button, as she has fallen recently. Will have [place]  Come out and do a home surveillance and nurse come out to assess safety with medication. Sister says she will try to get button, in pass they tried and she refused.   - Ambulatory referral to Home Health  3. Senile osteoporosis She reports taking her Fosamax as prescribed, but states she does not like it. Will continue this treatment at this time, as she is at high risk for a fracture.  - Ambulatory referral to [facility]  4. Fall, initial encounter She fell going to the bathroom, she was using a cane at that time, as the walker is very big for her home she says. She does not report injury and does not recall when it happened, but knows it happen since last visit to office in Oct. Order [facility] for assessment and safety concerns.   - Ambulatory referral to Home Health  5. Constipation, slow transit Will continue Miralax at this time, as she continues to complain of constipation.   6. Ataxia, late effect of cerebrovascular disease She reports trouble moving. She forgets about her CVA being the cause of this. Encouraged to continue to use cane and walker to help prevent a recurrent fall.   7. Post-traumatic osteoarthritis of left knee She reports on going knee pain, but states she is okay and does not need tylenol for this. Will continue to monitor.    Labs/tests ordered:   Orders Placed This Encounter  Procedures  . Ambulatory referral to Home Health    Referral Priority:   Routine    Referral Type:   Home Health Care    Referral Reason:   Specialty Services Required    Requested Specialty:   [place]    Number of Visits Requested:   1    Next appt:  06/12/2018   Karen Kays, RN, [facility] [phone], [place] [address] Cell Phone (Mon-Fri 8am-5pm):  [phone] On Call:  [phone] &  follow prompts after 5pm & weekends  Office Phone:  [phone] Office Fax:  [phone]

## 2018-03-03 ENCOUNTER — Encounter (HOSPITAL_COMMUNITY): Payer: Self-pay | Admitting: Family Medicine

## 2018-03-03 ENCOUNTER — Emergency Department (HOSPITAL_COMMUNITY)
Admission: EM | Admit: 2018-03-03 | Discharge: 2018-03-03 | Disposition: A | Discharge status: Home / Self Care | Payer: Medicare Other | Attending: Emergency Medicine | Admitting: Emergency Medicine

## 2018-03-03 DIAGNOSIS — R58 Hemorrhage, not elsewhere classified: Secondary | ICD-10-CM | POA: Diagnosis not present

## 2018-03-03 DIAGNOSIS — R040 Epistaxis: Secondary | ICD-10-CM | POA: Insufficient documentation

## 2018-03-03 DIAGNOSIS — Z5321 Procedure and treatment not carried out due to patient leaving prior to being seen by health care provider: Secondary | ICD-10-CM | POA: Diagnosis not present

## 2018-03-03 MED ORDER — OXYMETAZOLINE HCL 0.05 % NA SOLN
1.0000 | Freq: Once | NASAL | Status: AC
  Administered 2018-03-03: 1 via NASAL
  Filled 2018-03-03: qty 15

## 2018-03-03 NOTE — ED Triage Notes (Signed)
Patient is from home and transported via [facility]. Patient is experiencing a nosebleed for the last hour. Per EMS, this occurred on Saturday but didn't seek medical treatment. EMS reports she is not taking any blood thinners but takes a ASA 81mg .

## 2018-03-04 ENCOUNTER — Encounter (HOSPITAL_COMMUNITY): Payer: Self-pay | Admitting: Pharmacy Technician

## 2018-03-04 ENCOUNTER — Ambulatory Visit (INDEPENDENT_AMBULATORY_CARE_PROVIDER_SITE_OTHER): Payer: Medicare Other | Admitting: Internal Medicine

## 2018-03-04 ENCOUNTER — Encounter: Payer: Self-pay | Admitting: Internal Medicine

## 2018-03-04 ENCOUNTER — Emergency Department (HOSPITAL_COMMUNITY)
Admission: EM | Admit: 2018-03-04 | Discharge: 2018-03-04 | Disposition: A | Discharge status: Home / Self Care | Payer: Medicare Other | Attending: Emergency Medicine | Admitting: Emergency Medicine

## 2018-03-04 VITALS — BP 128/80 | HR 98 | Temp 98.0°F | Ht 61.0 in | Wt 161.0 lb

## 2018-03-04 DIAGNOSIS — Z8673 Personal history of transient ischemic attack (TIA), and cerebral infarction without residual deficits: Secondary | ICD-10-CM | POA: Diagnosis not present

## 2018-03-04 DIAGNOSIS — Z7982 Long term (current) use of aspirin: Secondary | ICD-10-CM | POA: Insufficient documentation

## 2018-03-04 DIAGNOSIS — R58 Hemorrhage, not elsewhere classified: Secondary | ICD-10-CM | POA: Diagnosis not present

## 2018-03-04 DIAGNOSIS — G309 Alzheimer's disease, unspecified: Secondary | ICD-10-CM | POA: Diagnosis not present

## 2018-03-04 DIAGNOSIS — F0150 Vascular dementia without behavioral disturbance: Secondary | ICD-10-CM | POA: Diagnosis not present

## 2018-03-04 DIAGNOSIS — J302 Other seasonal allergic rhinitis: Secondary | ICD-10-CM | POA: Diagnosis not present

## 2018-03-04 DIAGNOSIS — Z79899 Other long term (current) drug therapy: Secondary | ICD-10-CM | POA: Insufficient documentation

## 2018-03-04 DIAGNOSIS — I10 Essential (primary) hypertension: Secondary | ICD-10-CM | POA: Diagnosis not present

## 2018-03-04 DIAGNOSIS — E785 Hyperlipidemia, unspecified: Secondary | ICD-10-CM | POA: Insufficient documentation

## 2018-03-04 DIAGNOSIS — E039 Hypothyroidism, unspecified: Secondary | ICD-10-CM | POA: Insufficient documentation

## 2018-03-04 DIAGNOSIS — R040 Epistaxis: Secondary | ICD-10-CM

## 2018-03-04 DIAGNOSIS — I6300 Cerebral infarction due to thrombosis of unspecified precerebral artery: Secondary | ICD-10-CM

## 2018-03-04 LAB — CBC
HCT: 36.3 % (ref 35.0–45.0)
Hemoglobin: 11.7 g/dL (ref 11.7–15.5)
MCH: 29.6 pg (ref 27.0–33.0)
MCHC: 32.2 g/dL (ref 32.0–36.0)
MCV: 91.9 fL (ref 80.0–100.0)
MPV: 12.7 fL — ABNORMAL HIGH (ref 7.5–12.5)
Platelets: 187 10*3/uL (ref 140–400)
RBC: 3.95 10*6/uL (ref 3.80–5.10)
RDW: 13.7 % (ref 11.0–15.0)
WBC: 4.7 10*3/uL (ref 3.8–10.8)

## 2018-03-04 MED ORDER — TRANEXAMIC ACID 1000 MG/10ML IV SOLN
500.0000 mg | Freq: Once | INTRAVENOUS | Status: AC
  Administered 2018-03-04: 500 mg via TOPICAL
  Filled 2018-03-04: qty 10

## 2018-03-04 NOTE — ED Notes (Signed)
Pt and family verbalized understanding of discharge instructions °

## 2018-03-04 NOTE — ED Notes (Signed)
ENT cart at bedside

## 2018-03-04 NOTE — ED Triage Notes (Signed)
Pt arrives via EMS from home with reports of epistaxis onset 3 days ago. Seen at [facility] and discharged yesterday for same. Bleeding started back today at approx 1400. Pt with clots coming from nasal passage. Denies blood thinners. Takes an aspirin daily. BP 180/90, HR 80, 98% room air. Pt in NAD. Steady flow of blood from nose.

## 2018-03-04 NOTE — Progress Notes (Signed)
Patient ID: Shelia Marks, female   DOB: 1926-07-17, 84 y.o.   MRN: [id number]   [facility] OFFICE  Provider: DR Arletha Grippe  Code Status:  Goals of Care:  Advanced Directives 03/03/2018  Does Patient Have a Medical Advance Directive? No  Type of Advance Directive -  Does patient want to make changes to medical advance directive? -  Copy of [place] in Chart? -  Would patient like information on creating a medical advance directive? No - Patient declined  Pre-existing out of facility DNR order (yellow form or pink MOST form) -     Chief Complaint  Patient presents with  . Acute Visit    Nosebleed x 2 episodes, called EMS on Saturday. Last Episode was yesterday, went to hospital, left due to long wait      HPI: Patient is a 84 y.o. female seen today for an acute visit for 2 episodes of epistaxis in last week. Yesterday she was given afrin nasal spray in the ED but did not wait to see provider after being told ER wait was 11 hrs. BP yesterday 175/86. Bleed was worse on 03/01/18 and EMS called but she refused to go to the ED. She has a hx HTN and takes amlodipine, atenolol, lisinopril. BP 128/80 today.   Pt is a poor historian due to dementia. Hx obtained from chart  Past Medical History:  Diagnosis Date  . Alzheimer's disease   . Cancer ([place])   . Disorder of bone and cartilage, unspecified   . Dizziness and giddiness   . Hyperlipidemia LDL goal < 100   . Hypertension   . Hypothyroidism   . Malignant neoplasm of thyroid gland ([place])   . Nontoxic uninodular goiter   . Other malaise and fatigue   . Other symptoms involving cardiovascular system   . Phlebitis and thrombophlebitis of unspecified site   . Senile osteoporosis   . Sleep related leg cramps   . Stroke ([place])   . Unspecified disorder of lipoid metabolism   . Unspecified hereditary and idiopathic peripheral neuropathy   . Varicose veins of lower extremities with inflammation     Past Surgical  History:  Procedure Laterality Date  . ABDOMINAL HYSTERECTOMY     DR HUGES  . BREAST BIOPSY Left 05/21/2016   malignant  . CATRACT SURGERY  2008  . COLON SURGERY    . KNEE SURGERY     right  . PARATHYROID ADENOMA REMOVED  1983  . THYROID SURGERY  July 20, 2011     reports that  has never smoked. she has never used smokeless tobacco. She reports that she does not drink alcohol or use drugs. Social History   Socioeconomic History  . Marital status: Widowed    Spouse name: Not on file  . Number of children: Not on file  . Years of education: Not on file  . Highest education level: Not on file  Social Needs  . Financial resource strain: Not on file  . Food insecurity - worry: Not on file  . Food insecurity - inability: Not on file  . Transportation needs - medical: Not on file  . Transportation needs - non-medical: Not on file  Occupational History  . Not on file  Tobacco Use  . Smoking status: Never Smoker  . Smokeless tobacco: Never Used  Substance and Sexual Activity  . Alcohol use: No  . Drug use: No  . Sexual activity: Not on file  Other Topics Concern  .  Not on file  Social History Narrative  . Not on file    Family History  Problem Relation Age of Onset  . Diabetes Brother     No Known Allergies  Outpatient Encounter Medications as of 03/04/2018  Medication Sig  . alendronate (FOSAMAX) 70 MG tablet TAKE 1 TABLET EVERY SEVEN DAYS. TAKE WITH A FULL GLASS OF WATER ON AN EMPTY STOMACH.  Marland Kitchen allopurinol (ZYLOPRIM) 300 MG tablet TAKE 1 TABLET ONE TIME DAILY  . amLODipine (NORVASC) 10 MG tablet TAKE 1 TABLET ONE TIME DAILY  . aspirin EC 81 MG tablet Take 1 tablet (81 mg total) by mouth daily.  Marland Kitchen atenolol (TENORMIN) 25 MG tablet TAKE 1 TABLET EVERY DAY  . atorvastatin (LIPITOR) 40 MG tablet TAKE 1 TABLET EVERY DAY  . benzonatate (TESSALON PERLES) 100 MG capsule Take one capsule by mouth at bedtime as needed for cough  . calcium carbonate (TUMS - DOSED IN MG  ELEMENTAL CALCIUM) 500 MG chewable tablet Chew 1 tablet by mouth as needed for indigestion or heartburn.  . Cholecalciferol (VITAMIN D) 2000 units CAPS Take 1 capsule (2,000 Units total) by mouth daily.  . iron polysaccharides (NIFEREX) 150 MG capsule Take 1 capsule (150 mg total) by mouth every other day.  . levothyroxine (SYNTHROID, LEVOTHROID) 112 MCG tablet Take 112 mcg by mouth daily before breakfast. RX'ed Dr.Balan  . lisinopril (PRINIVIL,ZESTRIL) 2.5 MG tablet TAKE ONE TABLET DAILY TO CONTROL BLOOD PRESSURE  . polyethylene glycol powder (GLYCOLAX/MIRALAX) powder 17 grams in 8oz of water or juice now and if no Bowel Movement by bedtime, repeat Miralax dose. If this is unsuccessful call office.  . sertraline (ZOLOFT) 50 MG tablet TAKE ONE TABLET DAILY FOR DEPRESSION   No facility-administered encounter medications on file as of 03/04/2018.     Review of Systems:  Review of Systems  Unable to perform ROS: Dementia    Health Maintenance  Topic Date Due  . TETANUS/TDAP  12/08/1945  . INFLUENZA VACCINE  Completed  . DEXA SCAN  Completed  . PNA vac Low Risk Adult  Completed    Physical Exam: Vitals:   03/04/18 1058  BP: 128/80  Pulse: 98  Temp: 98 F (36.7 C)  TempSrc: Oral  Weight: 161 lb (73 kg)  Height: 5\' 1"  (1.549 m)   Body mass index is 30.42 kg/m. Physical Exam  Constitutional: She appears well-developed and well-nourished.  HENT:  Anterior nare with dried blood on left; bright red blood noted right nare; b/l dry grey enlarged turbinates; oropharynx cobblestoning but no redness or exudate; MMM; no oral thrush; no sinus TTP  Eyes: Pupils are equal, round, and reactive to light. No scleral icterus.  Neck: Neck supple.  Cardiovascular: Normal rate, regular rhythm and intact distal pulses. Exam reveals no gallop and no friction rub.  Murmur (1/6 SEM) heard. Trace LE edema b/l. No calf TTP  Pulmonary/Chest: Effort normal and breath sounds normal. No respiratory distress.  She has no wheezes. She has no rales. She exhibits no tenderness.  Neurological: She is alert.  Skin: Skin is warm and dry. No rash noted.  Psychiatric: She has a normal mood and affect. Her behavior is normal. Thought content normal.    Labs reviewed: Basic Metabolic Panel: Recent Labs    02/25/18 0937  NA 143  K 3.9  CL 107  CO2 29  GLUCOSE 91  BUN 19  CREATININE 0.99*  CALCIUM 9.6  TSH 2.61   Liver Function Tests: Recent Labs  02/25/18 0937  AST 27  ALT 19  BILITOT 0.8  PROT 6.9   No results for input(s): LIPASE, AMYLASE in the last 8760 hours. No results for input(s): AMMONIA in the last 8760 hours. CBC: Recent Labs    02/25/18 0937  WBC 3.4*  NEUTROABS 2,050  HGB 11.3*  HCT 34.2*  MCV 91.2  PLT 174   Lipid Panel: Recent Labs    02/25/18 0937  CHOL 151  HDL 50*  TRIG 74  CHOLHDL 3.0   Lab Results  Component Value Date   HGBA1C 5.0 11/09/2015    Procedures since last visit: No results found.  Assessment/Plan   ICD-10-CM   1. Epistaxis likely 2/2 #2 R04.0 CBC (no diff)  2. Seasonal allergic rhinitis, unspecified trigger J30.2   3. Vascular dementia without behavioral disturbance F01.50    she declined ENT eval and nasal steroid  START SALINE NASAL SPRAY (RECOMMEND AYR) TO USE 2 SPRAYS EACH NOSTRIL AS NEEDED TO KEEP NOSE MOIST  May need nasal steroid (eg, flonase, nasacort, nasonex) vs ENT referral if no better  Will call with lab result  Continue all medications as ordered, including aspirin  Follow up with Dr Mariea Clonts as scheduled or sooner if no better  Handout on nosebleeds given  [facility] S. Perlie Gold  [facility] [address] [place], [place] [address] [phone] Cell (Monday-Friday 8 AM - 5 PM) [phone] After 5 PM and follow prompts

## 2018-03-04 NOTE — Discharge Instructions (Signed)
Use the Afrin spray 2 times a day for the next 3 days.  Also place vasoline on the inside of your nose 2-3x a day to keep it moist.  Avoid excessive nose blowing or picking

## 2018-03-04 NOTE — ED Provider Notes (Signed)
[facility] EMERGENCY DEPARTMENT Provider Note   CSN: [id number] Arrival date & time: 03/04/18  1759     History   Chief Complaint Chief Complaint  Patient presents with  . Epistaxis    HPI Shelia Marks is a 84 y.o. female.  Patient is an 84 year old female with a history of dementia, hypertension, thyroid neoplasm, stroke presenting today with a stuttering nosebleed over the last 3 days.  Patient actually has come to the emergency room yesterday for this nosebleed however the wait was so long she decided to leave.  She went to see her PCP today and the bleeding had resolved.  However soon as she got home the bleeding restarted out of the left nostril and so she returned for further evaluation.  She has not had a nosebleed in a long time.  She denies any trauma.  She only takes a baby aspirin.   The history is provided by the patient.  Epistaxis   This is a new problem. Episode onset: 3 days. The problem occurs daily. The problem has not changed since onset.The problem is associated with aspirin. The bleeding has been from the left nare. She has tried applying pressure for the symptoms. The treatment provided no relief. Her past medical history does not include nose-picking.    Past Medical History:  Diagnosis Date  . Alzheimer's disease   . Cancer ([place])   . Disorder of bone and cartilage, unspecified   . Dizziness and giddiness   . Hyperlipidemia LDL goal < 100   . Hypertension   . Hypothyroidism   . Malignant neoplasm of thyroid gland ([place])   . Nontoxic uninodular goiter   . Other malaise and fatigue   . Other symptoms involving cardiovascular system   . Phlebitis and thrombophlebitis of unspecified site   . Senile osteoporosis   . Sleep related leg cramps   . Stroke ([place])   . Unspecified disorder of lipoid metabolism   . Unspecified hereditary and idiopathic peripheral neuropathy   . Varicose veins of lower extremities with inflammation      Patient Active Problem List   Diagnosis Date Noted  . Flu vaccine need 10/28/2017  . Advanced care planning/counseling discussion 02/07/2017  . Osteoarthritis of left knee 11/05/2016  . Loss of weight 06/01/2016  . Depression due to stroke 06/01/2016  . Vascular dementia 06/01/2016  . Ductal carcinoma in situ (DCIS) of left breast 05/21/2016  . Ataxia, late effect of cerebrovascular disease 04/23/2014  . Right pontine stroke ([place]) 04/18/2014  . Hypothyroidism, postsurgical 04/18/2014  . Dysphagia as late effect of stroke 04/18/2014  . Essential hypertension, benign 04/18/2014  . Constipation, slow transit 04/18/2014  . 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 Senile osteoporosis   . Hyperlipidemia LDL goal < 100     Past Surgical History:  Procedure Laterality Date  . ABDOMINAL HYSTERECTOMY     DR HUGES  . BREAST BIOPSY Left 05/21/2016   malignant  . CATRACT SURGERY  2008  . COLON SURGERY    . KNEE SURGERY     right  . PARATHYROID ADENOMA REMOVED  1983  . THYROID SURGERY  July 20, 2011    OB History    No data available       Home Medications    Prior to Admission medications   Medication Sig Start Date End Date Taking? Authorizing Provider  alendronate (FOSAMAX) 70 MG tablet TAKE 1 TABLET EVERY SEVEN DAYS. TAKE WITH A FULL GLASS OF WATER  ON AN EMPTY STOMACH. 02/18/18  Yes Reed, Tiffany L, DO  allopurinol (ZYLOPRIM) 300 MG tablet TAKE 1 TABLET ONE TIME DAILY 02/18/18  Yes Reed, Tiffany L, DO  amLODipine (NORVASC) 10 MG tablet TAKE 1 TABLET ONE TIME DAILY 02/18/18  Yes Reed, Tiffany L, DO  aspirin EC 81 MG tablet Take 1 tablet (81 mg total) by mouth daily. 07/14/15  Yes Reed, Tiffany L, DO  atenolol (TENORMIN) 25 MG tablet TAKE 1 TABLET EVERY DAY 02/18/18  Yes Reed, Tiffany L, DO  atorvastatin (LIPITOR) 40 MG tablet TAKE 1 TABLET EVERY DAY 02/18/18  Yes Reed, Tiffany L, DO  calcium carbonate (TUMS - DOSED IN MG ELEMENTAL CALCIUM) 500 MG chewable tablet Chew  1 tablet by mouth as needed for indigestion or heartburn.   Yes [provider]  levothyroxine (SYNTHROID, LEVOTHROID) 112 MCG tablet Take 112 mcg by mouth daily before breakfast. RX'ed Dr.Balan   Yes [provider]  lisinopril (PRINIVIL,ZESTRIL) 2.5 MG tablet TAKE ONE TABLET DAILY TO CONTROL BLOOD PRESSURE 02/18/18  Yes Reed, Tiffany L, DO  sertraline (ZOLOFT) 50 MG tablet TAKE ONE TABLET DAILY FOR DEPRESSION 02/18/18  Yes Reed, Tiffany L, DO  benzonatate (TESSALON PERLES) 100 MG capsule Take one capsule by mouth at bedtime as needed for cough Patient not taking: Reported on 03/04/2018 02/08/17   Hollace Kinnier L, DO  Cholecalciferol (VITAMIN D) 2000 units CAPS Take 1 capsule (2,000 Units total) by mouth daily. Patient not taking: Reported on 03/04/2018 11/20/16   Hollace Kinnier L, DO  iron polysaccharides (NIFEREX) 150 MG capsule Take 1 capsule (150 mg total) by mouth every other day. Patient not taking: Reported on 03/04/2018 02/13/16   Hollace Kinnier L, DO  polyethylene glycol powder (GLYCOLAX/MIRALAX) powder 17 grams in 8oz of water or juice now and if no Bowel Movement by bedtime, repeat Miralax dose. If this is unsuccessful call office. Patient not taking: Reported on 03/04/2018 04/23/17   Gayland Curry, DO    Family History Family History  Problem Relation Age of Onset  . Diabetes Brother     Social History Social History   Tobacco Use  . Smoking status: Never Smoker  . Smokeless tobacco: Never Used  Substance Use Topics  . Alcohol use: No  . Drug use: No     Allergies   Patient has no known allergies.   Review of Systems Review of Systems  HENT: Positive for nosebleeds.   All other systems reviewed and are negative.    Physical Exam Updated Vital Signs BP (!) 173/84 (BP Location: Left Arm)   Pulse 74   Temp 98.8 F (37.1 C) (Oral)   Resp 18   SpO2 100%   Physical Exam  Constitutional: She is oriented to person, place, and time. She appears  well-developed and well-nourished. No distress.  HENT:  Nose: Epistaxis is observed.    Eyes: Pupils are equal, round, and reactive to light.  Cardiovascular: Normal rate.  Pulmonary/Chest: Effort normal.  Neurological: She is alert and oriented to person, place, and time.  Skin: Skin is warm and dry.  Psychiatric: She has a normal mood and affect. Her behavior is normal.  Nursing note and vitals reviewed.    ED Treatments / Results  Labs (all labs ordered are listed, but only abnormal results are displayed) Labs Reviewed - No data to display  EKG  EKG Interpretation None       Radiology No results found.  Procedures .Epistaxis Management Date/Time: 03/04/2018 7:31 PM Performed  by: Blanchie Dessert, MD Authorized by: Blanchie Dessert, MD   Consent:    Consent obtained:  Verbal   Consent given by:  Patient   Risks discussed:  Bleeding   Alternatives discussed:  No treatment Anesthesia (see MAR for exact dosages):    Anesthesia method:  None Procedure details:    Treatment site:  L anterior   Repair method: afrin and TXA.   Treatment complexity:  Limited   Treatment episode: initial   Post-procedure details:    Assessment:  Bleeding stopped   Patient tolerance of procedure:  Tolerated well, no immediate complications   (including critical care time)  Medications Ordered in ED Medications  tranexamic acid (CYKLOKAPRON) injection 500 mg (not administered)     Initial Impression / Assessment and Plan / ED Course  I have reviewed the triage vital signs and the nursing notes.  Pertinent labs & imaging results that were available during my care of the patient were reviewed by me and considered in my medical decision making (see chart for details).     Patient presenting with a left-sided anterior nosebleed.  She is hemodynamically stable.  She is not on anticoagulation.  Clot removed from the left nare and Afrin and TXA applied.  After monitoring there is no  recurrence and bleed and patient was discharged home.  Final Clinical Impressions(s) / ED Diagnoses   Final diagnoses:  Left-sided epistaxis    ED Discharge Orders    None       Blanchie Dessert, MD 03/04/18 2031

## 2018-03-04 NOTE — Patient Instructions (Addendum)
START SALINE NASAL SPRAY (RECOMMEND AYR) TO USE 2 SPRAYS EACH NOSTRIL AS NEEDED TO KEEP NOSE MOIST  May need nasal steroid (eg, flonase, nasacort, nasonex) vs ENT referral if no better  Will call with lab result  Continue all medications as ordered, including aspirin  Follow up with Dr Mariea Clonts as scheduled or sooner if no better   Nosebleed, Adult A nosebleed is when blood comes out of the nose. Nosebleeds are common. Usually, they are not a sign of a serious condition. Nosebleeds can happen if a small blood vessel in your nose starts to bleed or if the lining of your nose (mucous membrane) cracks. They are commonly caused by:  Allergies.  Colds.  Picking your nose.  Blowing your nose too hard.  An injury from sticking an object into your nose or getting hit in the nose.  Dry or cold air.  Less common causes of nosebleeds include:  Toxic fumes.  Something abnormal in the nose or in the air-filled spaces in the bones of the face (sinuses).  Growths in the nose, such as polyps.  Medicines or conditions that cause blood to clot slowly.  Certain illnesses or procedures that irritate or dry out the nasal passages.  Follow these instructions at home: When you have a nosebleed:  Sit down and tilt your head slightly forward.  Use a clean towel or tissue to pinch your nostrils under the bony part of your nose. After 10 minutes, let go of your nose and see if bleeding starts again. Do not release pressure before that time. If there is still bleeding, repeat the pinching and holding for 10 minutes until the bleeding stops.  Do not place tissues or gauze in the nose to stop bleeding.  Avoid lying down and avoid tilting your head backward. That may make blood collect in the throat and cause gagging or coughing.  Use a nasal spray decongestant to help with a nosebleed as told by your health care provider.  Do not use petroleum jelly or mineral oil in your nose. It can drip into  your lungs. After a nosebleed:  Avoid blowing your nose or sniffing for a number of hours.  Avoid straining, lifting, or bending at the waist for several days. You may resume other normal activities as you are able.  Use saline spray or a humidifier as told by your health care provider.  Aspirinand blood thinners make bleeding more likely. If you are prescribed these medicines and you suffer from nosebleeds: ? Ask your health care provider if you should stop taking the medicines or if you should adjust the dose. ? Do not stop taking medicines that your health care provider has recommended unless told by your health care provider.  If your nosebleed was caused by dry mucous membranes, use over-the-counter saline nasal spray or gel. This will keep the mucous membranes moist and allow them to heal. If you must use a lubricant: ? Choose one that is water-soluble. ? Use only as much as you need and use it only as often as needed. ? Do not lie down until several hours after you use it. Contact a health care provider if:  You have a fever.  You get nosebleeds often or more often than usual.  You bruise very easily.  You have a nosebleed from having something stuck in your nose.  You have bleeding in your mouth.  You vomit or cough up brown material.  You have a nosebleed after you start a  new medicine. Get help right away if:  You have a nosebleed after a fall or a head injury.  Your nosebleed does not go away after 20 minutes.  You feel dizzy or weak.  You have unusual bleeding from other parts of your body.  You have unusual bruising on other parts of your body.  You become sweaty.  You vomit blood. This information is not intended to replace advice given to you by your health care provider. Make sure you discuss any questions you have with your health care provider. Document Released: 09/26/2005 Document Revised: 08/16/2016 Document Reviewed: 07/03/2016 Elsevier  Interactive Patient Education  [employer].

## 2018-03-06 ENCOUNTER — Telehealth: Payer: Self-pay

## 2018-03-06 DIAGNOSIS — I69311 Memory deficit following cerebral infarction: Secondary | ICD-10-CM | POA: Diagnosis not present

## 2018-03-06 DIAGNOSIS — R040 Epistaxis: Secondary | ICD-10-CM | POA: Diagnosis not present

## 2018-03-06 DIAGNOSIS — F0150 Vascular dementia without behavioral disturbance: Secondary | ICD-10-CM | POA: Diagnosis not present

## 2018-03-06 DIAGNOSIS — G609 Hereditary and idiopathic neuropathy, unspecified: Secondary | ICD-10-CM | POA: Diagnosis not present

## 2018-03-06 DIAGNOSIS — I10 Essential (primary) hypertension: Secondary | ICD-10-CM | POA: Diagnosis not present

## 2018-03-06 DIAGNOSIS — M810 Age-related osteoporosis without current pathological fracture: Secondary | ICD-10-CM | POA: Diagnosis not present

## 2018-03-06 DIAGNOSIS — R2689 Other abnormalities of gait and mobility: Secondary | ICD-10-CM | POA: Diagnosis not present

## 2018-03-06 DIAGNOSIS — M6281 Muscle weakness (generalized): Secondary | ICD-10-CM | POA: Diagnosis not present

## 2018-03-06 NOTE — Telephone Encounter (Signed)
Spoke with Marlowe Kays with [facility]. Marlowe Kays called to report that patient had Start of Care appointment.  Patient's toes are cool to the touch, unable to find pedal pulse (sign of poor circulation) and patient reported that she is not taking iron supplement for she feels she does not need it   Marlowe Kays would like a call back as well as calling there patient with recommendations/advice  Please advise

## 2018-03-06 NOTE — Telephone Encounter (Signed)
She's had pulses for me at her visits.  If the iron is on her list, she should be taking it.

## 2018-03-06 NOTE — Telephone Encounter (Signed)
Left message on voicemail for patient to return call when available , reason for call: Relay Dr.Reed's response   Left detailed message for Shelia Marks on secure voicemail with Dr.Reed's response

## 2018-03-07 NOTE — Telephone Encounter (Signed)
Sister returned call, she will restart the patient on Iron.

## 2018-03-10 DIAGNOSIS — M6281 Muscle weakness (generalized): Secondary | ICD-10-CM | POA: Diagnosis not present

## 2018-03-10 DIAGNOSIS — R040 Epistaxis: Secondary | ICD-10-CM | POA: Diagnosis not present

## 2018-03-10 DIAGNOSIS — I10 Essential (primary) hypertension: Secondary | ICD-10-CM | POA: Diagnosis not present

## 2018-03-10 DIAGNOSIS — M810 Age-related osteoporosis without current pathological fracture: Secondary | ICD-10-CM | POA: Diagnosis not present

## 2018-03-10 DIAGNOSIS — I69311 Memory deficit following cerebral infarction: Secondary | ICD-10-CM | POA: Diagnosis not present

## 2018-03-10 DIAGNOSIS — F0150 Vascular dementia without behavioral disturbance: Secondary | ICD-10-CM | POA: Diagnosis not present

## 2018-03-12 DIAGNOSIS — I10 Essential (primary) hypertension: Secondary | ICD-10-CM | POA: Diagnosis not present

## 2018-03-12 DIAGNOSIS — R040 Epistaxis: Secondary | ICD-10-CM | POA: Diagnosis not present

## 2018-03-12 DIAGNOSIS — F0150 Vascular dementia without behavioral disturbance: Secondary | ICD-10-CM | POA: Diagnosis not present

## 2018-03-12 DIAGNOSIS — M810 Age-related osteoporosis without current pathological fracture: Secondary | ICD-10-CM | POA: Diagnosis not present

## 2018-03-12 DIAGNOSIS — I69311 Memory deficit following cerebral infarction: Secondary | ICD-10-CM | POA: Diagnosis not present

## 2018-03-12 DIAGNOSIS — M6281 Muscle weakness (generalized): Secondary | ICD-10-CM | POA: Diagnosis not present

## 2018-03-13 ENCOUNTER — Telehealth: Payer: Self-pay | Admitting: *Deleted

## 2018-03-13 DIAGNOSIS — M6281 Muscle weakness (generalized): Secondary | ICD-10-CM | POA: Diagnosis not present

## 2018-03-13 DIAGNOSIS — I69311 Memory deficit following cerebral infarction: Secondary | ICD-10-CM | POA: Diagnosis not present

## 2018-03-13 DIAGNOSIS — I10 Essential (primary) hypertension: Secondary | ICD-10-CM | POA: Diagnosis not present

## 2018-03-13 DIAGNOSIS — R040 Epistaxis: Secondary | ICD-10-CM

## 2018-03-13 DIAGNOSIS — M810 Age-related osteoporosis without current pathological fracture: Secondary | ICD-10-CM | POA: Diagnosis not present

## 2018-03-13 DIAGNOSIS — F0150 Vascular dementia without behavioral disturbance: Secondary | ICD-10-CM | POA: Diagnosis not present

## 2018-03-13 NOTE — Telephone Encounter (Signed)
Noted.  I wonder if she forgot her medicine.

## 2018-03-13 NOTE — Telephone Encounter (Signed)
Patient caregiver called and stated that patient had another nose bleed last night. Caregiver is wanting a referral to Dr. Constance Holster with [facility] [phone]. Stated the ER told her that if patient had another one she needed to see a specialist. Saw Dr. Eulas Post for this problem on 03/04/18. Also has been to the ER for the nosebleeds.  Please Advise.

## 2018-03-13 NOTE — Telephone Encounter (Signed)
Referral to ENT, Dr. Constance Holster, placed for epistaxis.  Pt is on only baby asa. Shelia Marks, D.O. [facility] [address], [place] [address] Cell Phone (Mon-Fri 8am-5pm):  [phone] On Call:  [phone] & follow prompts after 5pm & weekends Office Phone:  [phone] Office Fax:  [phone]

## 2018-03-13 NOTE — Telephone Encounter (Signed)
Shelia Marks with [facility] called and stated that she saw patient today and the daughter mentioned about a nosebleed last night. She took patient's blood pressure and it was 154/80 and yesterday it was 171/91. No Pain, No headache, No dizziness. BP usually runs around 130/70.  Nurse just wanted to make you aware.

## 2018-03-18 DIAGNOSIS — M6281 Muscle weakness (generalized): Secondary | ICD-10-CM | POA: Diagnosis not present

## 2018-03-18 DIAGNOSIS — I69311 Memory deficit following cerebral infarction: Secondary | ICD-10-CM | POA: Diagnosis not present

## 2018-03-18 DIAGNOSIS — M810 Age-related osteoporosis without current pathological fracture: Secondary | ICD-10-CM | POA: Diagnosis not present

## 2018-03-18 DIAGNOSIS — F0150 Vascular dementia without behavioral disturbance: Secondary | ICD-10-CM | POA: Diagnosis not present

## 2018-03-18 DIAGNOSIS — R040 Epistaxis: Secondary | ICD-10-CM | POA: Diagnosis not present

## 2018-03-18 DIAGNOSIS — I10 Essential (primary) hypertension: Secondary | ICD-10-CM | POA: Diagnosis not present

## 2018-03-19 DIAGNOSIS — I10 Essential (primary) hypertension: Secondary | ICD-10-CM | POA: Diagnosis not present

## 2018-03-19 DIAGNOSIS — F0150 Vascular dementia without behavioral disturbance: Secondary | ICD-10-CM | POA: Diagnosis not present

## 2018-03-19 DIAGNOSIS — M810 Age-related osteoporosis without current pathological fracture: Secondary | ICD-10-CM | POA: Diagnosis not present

## 2018-03-19 DIAGNOSIS — I69311 Memory deficit following cerebral infarction: Secondary | ICD-10-CM | POA: Diagnosis not present

## 2018-03-19 DIAGNOSIS — M6281 Muscle weakness (generalized): Secondary | ICD-10-CM | POA: Diagnosis not present

## 2018-03-19 DIAGNOSIS — R040 Epistaxis: Secondary | ICD-10-CM | POA: Diagnosis not present

## 2018-03-20 DIAGNOSIS — R040 Epistaxis: Secondary | ICD-10-CM | POA: Diagnosis not present

## 2018-03-20 DIAGNOSIS — M6281 Muscle weakness (generalized): Secondary | ICD-10-CM | POA: Diagnosis not present

## 2018-03-20 DIAGNOSIS — F0150 Vascular dementia without behavioral disturbance: Secondary | ICD-10-CM | POA: Diagnosis not present

## 2018-03-20 DIAGNOSIS — I10 Essential (primary) hypertension: Secondary | ICD-10-CM | POA: Diagnosis not present

## 2018-03-20 DIAGNOSIS — M810 Age-related osteoporosis without current pathological fracture: Secondary | ICD-10-CM | POA: Diagnosis not present

## 2018-03-20 DIAGNOSIS — I69311 Memory deficit following cerebral infarction: Secondary | ICD-10-CM | POA: Diagnosis not present

## 2018-03-20 NOTE — Progress Notes (Signed)
[facility]  [phone] Fax:([phone]) [phone]     ID: Shelia Marks DOB: [birth year]  MR#: [id number]  AYT#:[id number]  Patient Care Team: Gayland Curry, DO as PCP - General (Geriatric Medicine) Rutherford Guys, MD as Consulting Physician (Ophthalmology) Magrinat, Virgie Dad, MD as Consulting Physician (Oncology) Jovita Kussmaul, MD as Consulting Physician (General Surgery) Jacelyn Pi, MD as Consulting Physician (Endocrinology) PCP: Gayland Curry, DO GYN: OTHER MD:  CHIEF COMPLAINT: Ductal carcinoma in situ  CURRENT TREATMENT: Observation   BREAST CANCER HISTORY: From the original intake note:  "Shelia Marks" had bilateral screening mammography at the [facility] 06/07/2016 showing calcifications in the left breast. She was recalled  05/15/2016 for left diagnostic mammography. This showed the breast density to be category be. In the upper outer left breast there was an area of suspicious calcifications measuring 1.7 cm.  Biopsy of this area was obtained 05/21/2016, and showed ([id number]) ductal carcinoma in situ, high-grade, estrogen receptor 100% positive, progesterone receptor 5% positive, WITH strong staining intensity.  Her subsequent history is as detailed below.  INTERVAL HISTORY: Shelia Marks returns today for follow-up of her noninvasive breast cancer accompanied by her sister. She continues on observation alone.   Since her last visit, she underwent diagnostic unilateral left breast mammography with CAD and tomography on 11/11/2017 at [facility] showing: breast density category B. Relatively stable group of calcifications in the left breast upper outer quadrant, which corresponds to the known biopsy-proven high-grade DCIS.   REVIEW OF SYSTEMS: Shelia Marks reports that she mostly stays at home. She does a little house cleaning. She has a home help nurse through [facility] that visits her. She still has problems with pain in her  right arm. She has bone running in her left knee. She is taking cortisone shots for this, but it helps for a little while. She uses a cane and walker, which helps her walk, but the walker scratches her floor. She denies unusual headaches, visual changes, nausea, vomiting, or dizziness. There has been no unusual cough, phlegm production, or pleurisy. This been no change in bowel or bladder habits. She denies unexplained fatigue or unexplained weight loss, bleeding, rash, or fever. A detailed review of systems was otherwise stable.   PAST MEDICAL HISTORY: Past Medical History:  Diagnosis Date  . Alzheimer's disease   . Cancer ([place])   . Disorder of bone and cartilage, unspecified   . Dizziness and giddiness   . Hyperlipidemia LDL goal < 100   . Hypertension   . Hypothyroidism   . Malignant neoplasm of thyroid gland ([place])   . Nontoxic uninodular goiter   . Other malaise and fatigue   . Other symptoms involving cardiovascular system   . Phlebitis and thrombophlebitis of unspecified site   . Senile osteoporosis   . Sleep related leg cramps   . Stroke ([place])   . Unspecified disorder of lipoid metabolism   . Unspecified hereditary and idiopathic peripheral neuropathy   . Varicose veins of lower extremities with inflammation     PAST SURGICAL HISTORY: Past Surgical History:  Procedure Laterality Date  . ABDOMINAL HYSTERECTOMY     DR HUGES  . BREAST BIOPSY Left 05/21/2016   malignant  . CATRACT SURGERY  2008  . COLON SURGERY    . KNEE SURGERY     right  . PARATHYROID ADENOMA REMOVED  1983  . THYROID SURGERY  July 20, 2011    FAMILY HISTORY Family History  Problem Relation Age of Onset  .  Diabetes Brother   The patient's father died from liver problems in his 66s. The patient's mother died in her 77s from "natural causes". The patient had no brothers. She had 6 sisters, 2 of whom died from complications of diabetes and one from a ruptured aneurysm. There is no history of breast or  ovarian cancer in the family.   GYNECOLOGIC HISTORY:  No LMP recorded. Patient has had a hysterectomy.  Shelia Marks does not remember how she was when she had her first period. She never carried a child to term. He is status post hysterectomy and at least one ovary was removed. She never took hormone replacement.   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     ADVANCED DIRECTIVES:  [name] [name] DAVIS IS HER HEALTHCARE PART OF ATTORNEY. [name] CAN BE REACHED AT [phone].   HEALTH MAINTENANCE: Social History   Tobacco Use  . Smoking status: Never Smoker  . Smokeless tobacco: Never Used  Substance Use Topics  . Alcohol use: No  . Drug use: No     No Known Allergies  Current Outpatient Medications  Medication Sig Dispense Refill  . alendronate (FOSAMAX) 70 MG tablet TAKE 1 TABLET EVERY SEVEN DAYS. TAKE WITH A FULL GLASS OF WATER ON AN EMPTY STOMACH. 12 tablet 0  . allopurinol (ZYLOPRIM) 300 MG tablet TAKE 1 TABLET ONE TIME DAILY 90 tablet 0  . amLODipine (NORVASC) 10 MG tablet TAKE 1 TABLET ONE TIME DAILY 90 tablet 0  . aspirin EC 81 MG tablet Take 1 tablet (81 mg total) by mouth daily. 30 tablet 3  . atenolol (TENORMIN) 25 MG tablet TAKE 1 TABLET EVERY DAY 90 tablet 0  . atorvastatin (LIPITOR) 40 MG tablet TAKE 1 TABLET EVERY DAY 90 tablet 0  . calcium carbonate (TUMS - DOSED IN MG ELEMENTAL CALCIUM) 500 MG chewable tablet Chew 1 tablet by mouth as needed for indigestion or heartburn.    . Cholecalciferol (VITAMIN D) 2000 units CAPS Take 1 capsule (2,000 Units total) by mouth daily. 30 capsule 2  . iron polysaccharides (NIFEREX) 150 MG capsule Take 1 capsule (150 mg total) by mouth every other day. 15 capsule 3  . levothyroxine (SYNTHROID, LEVOTHROID) 112 MCG tablet Take 112 mcg by mouth daily before breakfast. RX'ed Dr.Balan    . lisinopril (PRINIVIL,ZESTRIL) 2.5 MG tablet TAKE  ONE TABLET DAILY TO CONTROL BLOOD PRESSURE 90 tablet 0  . polyethylene glycol powder (GLYCOLAX/MIRALAX) powder 17 grams in 8oz of water or juice now and if no Bowel Movement by bedtime, repeat Miralax dose. If this is unsuccessful call office. 119 g 0  . sertraline (ZOLOFT) 50 MG tablet TAKE ONE TABLET DAILY FOR DEPRESSION 90 tablet 0   No current facility-administered medications for this visit.     OBJECTIVE: Older white Marks walking with a cane   Vitals:   03/25/18 1155  BP: 134/76  Pulse: 65  Resp: 18  Temp: 98.3 F (36.8 C)  SpO2: 100%     Body mass index is 30.14 kg/m.    ECOG FS:2 - Symptomatic, <50% confined to bed  Sclerae unicteric, bilateral arcus senilis No cervical or supraclavicular adenopathy Lungs no rales or rhonchi Heart regular rate and rhythm Abd soft, nontender, positive bowel sounds MSK no focal spinal tenderness, no upper extremity lymphedema Neuro: nonfocal, well oriented, positive affect Breasts: I do not palpate a mass in either  breast.  Both axillae are benign.  LAB RESULTS:  CMP     Component Value Date/Time   NA 143 02/25/2018 0937   NA 143 06/01/2016 0958   K 3.9 02/25/2018 0937   CL 107 02/25/2018 0937   CO2 29 02/25/2018 0937   GLUCOSE 91 02/25/2018 0937   BUN 19 02/25/2018 0937   BUN 21 06/01/2016 0958   CREATININE 0.99 (H) 02/25/2018 0937   CALCIUM 9.6 02/25/2018 0937   PROT 6.9 02/25/2018 0937   PROT 6.8 11/09/2015 1013   ALBUMIN 4.0 11/07/2016 0952   ALBUMIN 4.1 11/09/2015 1013   AST 27 02/25/2018 0937   ALT 19 02/25/2018 0937   ALKPHOS 76 11/07/2016 0952   BILITOT 0.8 02/25/2018 0937   BILITOT 0.5 11/09/2015 1013   GFRNONAA 51 (L) 02/25/2018 0937   GFRAA 59 (L) 02/25/2018 0937    INo results found for: SPEP, UPEP  Lab Results  Component Value Date   WBC 4.7 03/04/2018   NEUTROABS 2,050 02/25/2018   HGB 11.7 03/04/2018   HCT 36.3 03/04/2018   MCV 91.9 03/04/2018   PLT 187 03/04/2018      Chemistry        Component Value Date/Time   NA 143 02/25/2018 0937   NA 143 06/01/2016 0958   K 3.9 02/25/2018 0937   CL 107 02/25/2018 0937   CO2 29 02/25/2018 0937   BUN 19 02/25/2018 0937   BUN 21 06/01/2016 0958   CREATININE 0.99 (H) 02/25/2018 0937      Component Value Date/Time   CALCIUM 9.6 02/25/2018 0937   ALKPHOS 76 11/07/2016 0952   AST 27 02/25/2018 0937   ALT 19 02/25/2018 0937   BILITOT 0.8 02/25/2018 0937   BILITOT 0.5 11/09/2015 1013       No results found for: LABCA2  No components found for: [id number]  No results for input(s): INR in the last 168 hours.  Urinalysis    Component Value Date/Time   COLORURINE STRAW (A) 03/09/2016 2001   APPEARANCEUR CLEAR 03/09/2016 2001   LABSPEC 1.006 03/09/2016 2001   PHURINE 7.5 03/09/2016 2001   GLUCOSEU NEGATIVE 03/09/2016 2001   HGBUR NEGATIVE 03/09/2016 2001   [place] NEGATIVE 03/09/2016 2001   [place] 03/09/2016 2001   PROTEINUR NEGATIVE 03/09/2016 2001   UROBILINOGEN 0.2 03/05/2014 2055   NITRITE NEGATIVE 03/09/2016 2001   LEUKOCYTESUR NEGATIVE 03/09/2016 2001    STUDIES: Since her last visit, she underwent diagnostic unilateral left breast mammography with CAD and tomography on 11/11/2017 at [facility] showing: breast density category B. Relatively stable group of calcifications in the left breast upper outer quadrant, which corresponds to the known biopsy-proven high-grade DCIS.  ELIGIBLE FOR AVAILABLE RESEARCH PROTOCOL: no  ASSESSMENT: 84 y.o. Shelia Marks status post left breast upper outer quadrant biopsy 05/21/2016 for ductal carcinoma in situ, high-grade, estrogen and progesterone receptor positive  (1) opted against surgery   (2) started anastrozole neoadjuvantly 06/13/2016, discontinued June 2018 due to osteoporosis concerns  (a)  bone density 11/16/2016 shows a T score of -4.7 (osteoporosis).    PLAN:  Shelia Marks is now nearly 2 years out from definitive diagnosis of her  noninvasive breast cancer.  She continues on observation alone.  There is no evidence of disease progression by history or exam.  Her mammogram last November was also stable.  She will have her next mammogram this November.  She will then turned 89.  She tells me she is not planning any kind of  celebration for that anniversary  She has a walker at home which seems to need minor repairs.  [facility].  I do think she would be safer in terms of fall avoidance if she used a walker instead of a cane  She will see me again in 1 year.  She knows to call for any other issues that may develop before the next visit.   Magrinat, Virgie Dad, MD  03/25/18 12:12 PM Medical Oncology and Hematology [facility] [address] [place], [place] [address] Tel. [phone]    Fax. [phone]  This document serves as a record of services personally performed by Lurline Del, MD. It was created on his behalf by Sheron Nightingale, a trained medical scribe. The creation of this record is based on the scribe's personal observations and the provider's statements to them.   I have reviewed the above documentation for accuracy and completeness, and I agree with the above.

## 2018-03-24 DIAGNOSIS — R040 Epistaxis: Secondary | ICD-10-CM | POA: Diagnosis not present

## 2018-03-25 ENCOUNTER — Inpatient Hospital Stay: Payer: Medicare Other | Attending: Oncology | Admitting: Oncology

## 2018-03-25 ENCOUNTER — Telehealth: Payer: Self-pay | Admitting: Oncology

## 2018-03-25 VITALS — BP 134/76 | HR 65 | Temp 98.3°F | Resp 18 | Ht 61.0 in | Wt 159.5 lb

## 2018-03-25 DIAGNOSIS — E785 Hyperlipidemia, unspecified: Secondary | ICD-10-CM | POA: Insufficient documentation

## 2018-03-25 DIAGNOSIS — M810 Age-related osteoporosis without current pathological fracture: Secondary | ICD-10-CM

## 2018-03-25 DIAGNOSIS — Z853 Personal history of malignant neoplasm of breast: Secondary | ICD-10-CM | POA: Insufficient documentation

## 2018-03-25 DIAGNOSIS — Z85850 Personal history of malignant neoplasm of thyroid: Secondary | ICD-10-CM | POA: Diagnosis not present

## 2018-03-25 DIAGNOSIS — D0512 Intraductal carcinoma in situ of left breast: Secondary | ICD-10-CM | POA: Insufficient documentation

## 2018-03-25 DIAGNOSIS — Z79899 Other long term (current) drug therapy: Secondary | ICD-10-CM | POA: Insufficient documentation

## 2018-03-25 DIAGNOSIS — Z90710 Acquired absence of both cervix and uterus: Secondary | ICD-10-CM | POA: Insufficient documentation

## 2018-03-25 DIAGNOSIS — Z7982 Long term (current) use of aspirin: Secondary | ICD-10-CM | POA: Insufficient documentation

## 2018-03-25 DIAGNOSIS — E039 Hypothyroidism, unspecified: Secondary | ICD-10-CM | POA: Insufficient documentation

## 2018-03-25 DIAGNOSIS — Z170 Estrogen receptor positive status [ER+]: Secondary | ICD-10-CM | POA: Diagnosis not present

## 2018-03-25 DIAGNOSIS — I10 Essential (primary) hypertension: Secondary | ICD-10-CM | POA: Insufficient documentation

## 2018-03-25 DIAGNOSIS — Z8673 Personal history of transient ischemic attack (TIA), and cerebral infarction without residual deficits: Secondary | ICD-10-CM | POA: Diagnosis not present

## 2018-03-25 NOTE — Telephone Encounter (Signed)
Gave avs and calendar ° °

## 2018-03-26 DIAGNOSIS — M6281 Muscle weakness (generalized): Secondary | ICD-10-CM | POA: Diagnosis not present

## 2018-03-26 DIAGNOSIS — I69311 Memory deficit following cerebral infarction: Secondary | ICD-10-CM | POA: Diagnosis not present

## 2018-03-26 DIAGNOSIS — M810 Age-related osteoporosis without current pathological fracture: Secondary | ICD-10-CM | POA: Diagnosis not present

## 2018-03-26 DIAGNOSIS — I10 Essential (primary) hypertension: Secondary | ICD-10-CM | POA: Diagnosis not present

## 2018-03-26 DIAGNOSIS — F0150 Vascular dementia without behavioral disturbance: Secondary | ICD-10-CM | POA: Diagnosis not present

## 2018-03-26 DIAGNOSIS — R040 Epistaxis: Secondary | ICD-10-CM | POA: Diagnosis not present

## 2018-03-27 DIAGNOSIS — M6281 Muscle weakness (generalized): Secondary | ICD-10-CM | POA: Diagnosis not present

## 2018-03-27 DIAGNOSIS — F0150 Vascular dementia without behavioral disturbance: Secondary | ICD-10-CM | POA: Diagnosis not present

## 2018-03-27 DIAGNOSIS — R040 Epistaxis: Secondary | ICD-10-CM | POA: Diagnosis not present

## 2018-03-27 DIAGNOSIS — M810 Age-related osteoporosis without current pathological fracture: Secondary | ICD-10-CM | POA: Diagnosis not present

## 2018-03-27 DIAGNOSIS — I10 Essential (primary) hypertension: Secondary | ICD-10-CM | POA: Diagnosis not present

## 2018-03-27 DIAGNOSIS — I69311 Memory deficit following cerebral infarction: Secondary | ICD-10-CM | POA: Diagnosis not present

## 2018-03-28 DIAGNOSIS — F0150 Vascular dementia without behavioral disturbance: Secondary | ICD-10-CM | POA: Diagnosis not present

## 2018-03-28 DIAGNOSIS — M810 Age-related osteoporosis without current pathological fracture: Secondary | ICD-10-CM | POA: Diagnosis not present

## 2018-03-28 DIAGNOSIS — I69311 Memory deficit following cerebral infarction: Secondary | ICD-10-CM | POA: Diagnosis not present

## 2018-03-28 DIAGNOSIS — M6281 Muscle weakness (generalized): Secondary | ICD-10-CM | POA: Diagnosis not present

## 2018-03-28 DIAGNOSIS — I10 Essential (primary) hypertension: Secondary | ICD-10-CM | POA: Diagnosis not present

## 2018-03-28 DIAGNOSIS — R040 Epistaxis: Secondary | ICD-10-CM | POA: Diagnosis not present

## 2018-04-01 DIAGNOSIS — M6281 Muscle weakness (generalized): Secondary | ICD-10-CM | POA: Diagnosis not present

## 2018-04-01 DIAGNOSIS — I10 Essential (primary) hypertension: Secondary | ICD-10-CM | POA: Diagnosis not present

## 2018-04-01 DIAGNOSIS — R040 Epistaxis: Secondary | ICD-10-CM | POA: Diagnosis not present

## 2018-04-01 DIAGNOSIS — M810 Age-related osteoporosis without current pathological fracture: Secondary | ICD-10-CM | POA: Diagnosis not present

## 2018-04-01 DIAGNOSIS — I69311 Memory deficit following cerebral infarction: Secondary | ICD-10-CM | POA: Diagnosis not present

## 2018-04-01 DIAGNOSIS — F0150 Vascular dementia without behavioral disturbance: Secondary | ICD-10-CM | POA: Diagnosis not present

## 2018-04-02 DIAGNOSIS — F0150 Vascular dementia without behavioral disturbance: Secondary | ICD-10-CM | POA: Diagnosis not present

## 2018-04-02 DIAGNOSIS — M6281 Muscle weakness (generalized): Secondary | ICD-10-CM | POA: Diagnosis not present

## 2018-04-02 DIAGNOSIS — I69311 Memory deficit following cerebral infarction: Secondary | ICD-10-CM | POA: Diagnosis not present

## 2018-04-02 DIAGNOSIS — I10 Essential (primary) hypertension: Secondary | ICD-10-CM | POA: Diagnosis not present

## 2018-04-02 DIAGNOSIS — R040 Epistaxis: Secondary | ICD-10-CM | POA: Diagnosis not present

## 2018-04-02 DIAGNOSIS — M810 Age-related osteoporosis without current pathological fracture: Secondary | ICD-10-CM | POA: Diagnosis not present

## 2018-04-03 DIAGNOSIS — M6281 Muscle weakness (generalized): Secondary | ICD-10-CM | POA: Diagnosis not present

## 2018-04-03 DIAGNOSIS — F0150 Vascular dementia without behavioral disturbance: Secondary | ICD-10-CM | POA: Diagnosis not present

## 2018-04-03 DIAGNOSIS — R040 Epistaxis: Secondary | ICD-10-CM | POA: Diagnosis not present

## 2018-04-03 DIAGNOSIS — M810 Age-related osteoporosis without current pathological fracture: Secondary | ICD-10-CM | POA: Diagnosis not present

## 2018-04-03 DIAGNOSIS — I10 Essential (primary) hypertension: Secondary | ICD-10-CM | POA: Diagnosis not present

## 2018-04-03 DIAGNOSIS — I69311 Memory deficit following cerebral infarction: Secondary | ICD-10-CM | POA: Diagnosis not present

## 2018-04-07 DIAGNOSIS — M6281 Muscle weakness (generalized): Secondary | ICD-10-CM | POA: Diagnosis not present

## 2018-04-07 DIAGNOSIS — F0150 Vascular dementia without behavioral disturbance: Secondary | ICD-10-CM | POA: Diagnosis not present

## 2018-04-07 DIAGNOSIS — I69311 Memory deficit following cerebral infarction: Secondary | ICD-10-CM | POA: Diagnosis not present

## 2018-04-07 DIAGNOSIS — M810 Age-related osteoporosis without current pathological fracture: Secondary | ICD-10-CM | POA: Diagnosis not present

## 2018-04-07 DIAGNOSIS — R040 Epistaxis: Secondary | ICD-10-CM | POA: Diagnosis not present

## 2018-04-07 DIAGNOSIS — I10 Essential (primary) hypertension: Secondary | ICD-10-CM | POA: Diagnosis not present

## 2018-04-10 DIAGNOSIS — I10 Essential (primary) hypertension: Secondary | ICD-10-CM | POA: Diagnosis not present

## 2018-04-10 DIAGNOSIS — M6281 Muscle weakness (generalized): Secondary | ICD-10-CM | POA: Diagnosis not present

## 2018-04-10 DIAGNOSIS — R040 Epistaxis: Secondary | ICD-10-CM | POA: Diagnosis not present

## 2018-04-10 DIAGNOSIS — I69311 Memory deficit following cerebral infarction: Secondary | ICD-10-CM | POA: Diagnosis not present

## 2018-04-10 DIAGNOSIS — M810 Age-related osteoporosis without current pathological fracture: Secondary | ICD-10-CM | POA: Diagnosis not present

## 2018-04-10 DIAGNOSIS — F0150 Vascular dementia without behavioral disturbance: Secondary | ICD-10-CM | POA: Diagnosis not present

## 2018-04-16 DIAGNOSIS — M6281 Muscle weakness (generalized): Secondary | ICD-10-CM | POA: Diagnosis not present

## 2018-04-16 DIAGNOSIS — F0150 Vascular dementia without behavioral disturbance: Secondary | ICD-10-CM | POA: Diagnosis not present

## 2018-04-16 DIAGNOSIS — M810 Age-related osteoporosis without current pathological fracture: Secondary | ICD-10-CM | POA: Diagnosis not present

## 2018-04-16 DIAGNOSIS — I69311 Memory deficit following cerebral infarction: Secondary | ICD-10-CM | POA: Diagnosis not present

## 2018-04-16 DIAGNOSIS — I10 Essential (primary) hypertension: Secondary | ICD-10-CM | POA: Diagnosis not present

## 2018-04-16 DIAGNOSIS — R040 Epistaxis: Secondary | ICD-10-CM | POA: Diagnosis not present

## 2018-04-21 ENCOUNTER — Other Ambulatory Visit: Payer: Self-pay | Admitting: Internal Medicine

## 2018-05-01 DIAGNOSIS — R040 Epistaxis: Secondary | ICD-10-CM | POA: Diagnosis not present

## 2018-05-01 DIAGNOSIS — F0150 Vascular dementia without behavioral disturbance: Secondary | ICD-10-CM | POA: Diagnosis not present

## 2018-05-01 DIAGNOSIS — M6281 Muscle weakness (generalized): Secondary | ICD-10-CM | POA: Diagnosis not present

## 2018-05-01 DIAGNOSIS — I69311 Memory deficit following cerebral infarction: Secondary | ICD-10-CM | POA: Diagnosis not present

## 2018-05-01 DIAGNOSIS — M810 Age-related osteoporosis without current pathological fracture: Secondary | ICD-10-CM | POA: Diagnosis not present

## 2018-05-01 DIAGNOSIS — I10 Essential (primary) hypertension: Secondary | ICD-10-CM | POA: Diagnosis not present

## 2018-05-21 ENCOUNTER — Other Ambulatory Visit: Payer: Self-pay | Admitting: Oncology

## 2018-05-21 DIAGNOSIS — Z853 Personal history of malignant neoplasm of breast: Secondary | ICD-10-CM

## 2018-06-10 ENCOUNTER — Ambulatory Visit
Admission: RE | Admit: 2018-06-10 | Discharge: 2018-06-10 | Disposition: A | Discharge status: Home / Self Care | Payer: Medicare Other | Source: Ambulatory Visit | Attending: Oncology | Admitting: Oncology

## 2018-06-10 ENCOUNTER — Other Ambulatory Visit: Payer: Self-pay | Admitting: Oncology

## 2018-06-10 DIAGNOSIS — N6321 Unspecified lump in the left breast, upper outer quadrant: Secondary | ICD-10-CM | POA: Diagnosis not present

## 2018-06-10 DIAGNOSIS — Z853 Personal history of malignant neoplasm of breast: Secondary | ICD-10-CM

## 2018-06-10 DIAGNOSIS — R921 Mammographic calcification found on diagnostic imaging of breast: Secondary | ICD-10-CM | POA: Diagnosis not present

## 2018-06-10 NOTE — Progress Notes (Unsigned)
I called the patient's sister and let her know about the appointment to see me on [phone] so we can resume anastrozole or a different antiestrogen

## 2018-06-12 ENCOUNTER — Ambulatory Visit: Payer: Medicare Other | Admitting: Internal Medicine

## 2018-06-20 ENCOUNTER — Other Ambulatory Visit: Payer: Self-pay | Admitting: Internal Medicine

## 2018-06-23 ENCOUNTER — Ambulatory Visit (INDEPENDENT_AMBULATORY_CARE_PROVIDER_SITE_OTHER): Payer: Medicare Other | Admitting: Internal Medicine

## 2018-06-23 ENCOUNTER — Encounter: Payer: Self-pay | Admitting: Internal Medicine

## 2018-06-23 VITALS — BP 120/70 | HR 67 | Temp 98.4°F | Ht 61.0 in | Wt 163.0 lb

## 2018-06-23 DIAGNOSIS — F0150 Vascular dementia without behavioral disturbance: Secondary | ICD-10-CM

## 2018-06-23 DIAGNOSIS — I83811 Varicose veins of right lower extremities with pain: Secondary | ICD-10-CM | POA: Insufficient documentation

## 2018-06-23 DIAGNOSIS — M810 Age-related osteoporosis without current pathological fracture: Secondary | ICD-10-CM

## 2018-06-23 DIAGNOSIS — I69993 Ataxia following unspecified cerebrovascular disease: Secondary | ICD-10-CM

## 2018-06-23 DIAGNOSIS — D0512 Intraductal carcinoma in situ of left breast: Secondary | ICD-10-CM | POA: Diagnosis not present

## 2018-06-23 DIAGNOSIS — Z23 Encounter for immunization: Secondary | ICD-10-CM

## 2018-06-23 DIAGNOSIS — I6300 Cerebral infarction due to thrombosis of unspecified precerebral artery: Secondary | ICD-10-CM

## 2018-06-23 DIAGNOSIS — K5901 Slow transit constipation: Secondary | ICD-10-CM

## 2018-06-23 DIAGNOSIS — E890 Postprocedural hypothyroidism: Secondary | ICD-10-CM | POA: Diagnosis not present

## 2018-06-23 NOTE — Progress Notes (Signed)
Location:  [facility] clinic Provider:  Rydell Wiegel L. Mariea Clonts, D.O., C.M.D.  Code Status: DNR Goals of Care:  Advanced Directives 06/23/2018  Does Patient Have a Medical Advance Directive? Yes  Type of Advance Directive Out of facility DNR (pink MOST or yellow form)  Does patient want to make changes to medical advance directive? No - Patient declined  Copy of [place] in Chart? -  Would patient like information on creating a medical advance directive? -  Pre-existing out of facility DNR order (yellow form or pink MOST form) Yellow form placed in chart (order not valid for inpatient use)     Chief Complaint  Patient presents with  . Medical Management of Chronic Issues    40mth follow-up    HPI: Patient is a 84 y.o. female seen today for medical management of chronic diseases.    She reports queasiness and sometimes dizziness after her am meds.  She eats a light breakfast of an egg or oatmeal before the meds, then takes them all at once.    No falls.  Appetite is good at lunch and supper.   Weight is up 4 lbs.    Left breast gets a sharp pain thru it at times, but she's not sure she feels any new masses.  She sees Dr. Jana Hakim Friday.  She reports her varicose veins in her right leg burn at times.  Memory getting worse. Pt's outfit is mismatched which is atypical for her.  Her sister asks if there are any services that are free or covered by her insurance for help with laundry, errands, etc.  Explained these are paid services.    Office Visit from 06/23/2018 in [place]  AUDIT-C Score  0      Past Medical History:  Diagnosis Date  . Alzheimer's disease   . Cancer ([place])   . Disorder of bone and cartilage, unspecified   . Dizziness and giddiness   . Hyperlipidemia LDL goal < 100   . Hypertension   . Hypothyroidism   . Malignant neoplasm of thyroid gland ([place])   . Nontoxic uninodular goiter   . Other malaise and fatigue   . Other symptoms  involving cardiovascular system   . Phlebitis and thrombophlebitis of unspecified site   . Senile osteoporosis   . Sleep related leg cramps   . Stroke ([place])   . Unspecified disorder of lipoid metabolism   . Unspecified hereditary and idiopathic peripheral neuropathy   . Varicose veins of lower extremities with inflammation     Past Surgical History:  Procedure Laterality Date  . ABDOMINAL HYSTERECTOMY     DR HUGES  . BREAST BIOPSY Left 05/21/2016   malignant  . CATRACT SURGERY  2008  . COLON SURGERY    . KNEE SURGERY     right  . PARATHYROID ADENOMA REMOVED  1983  . THYROID SURGERY  July 20, 2011    No Known Allergies  Outpatient Encounter Medications as of 06/23/2018  Medication Sig  . alendronate (FOSAMAX) 70 MG tablet TAKE 1 TABLET (70 MG TOTAL) BY MOUTH EVERY 7 (SEVEN) DAYS. TAKE WITH A FULL GLASS OF WATER ON AN EMPTY STOMACH.  Marland Kitchen allopurinol (ZYLOPRIM) 300 MG tablet TAKE 1 TABLET EVERY DAY  . amLODipine (NORVASC) 10 MG tablet TAKE 1 TABLET EVERY DAY  . aspirin EC 81 MG tablet Take 1 tablet (81 mg total) by mouth daily.  Marland Kitchen atenolol (TENORMIN) 25 MG tablet TAKE 1 TABLET EVERY DAY  .  atorvastatin (LIPITOR) 40 MG tablet TAKE 1 TABLET EVERY DAY  . calcium carbonate (TUMS - DOSED IN MG ELEMENTAL CALCIUM) 500 MG chewable tablet Chew 1 tablet by mouth as needed for indigestion or heartburn.  . Cholecalciferol (VITAMIN D) 2000 units CAPS Take 1 capsule (2,000 Units total) by mouth daily.  . iron polysaccharides (NIFEREX) 150 MG capsule Take 1 capsule (150 mg total) by mouth every other day.  . levothyroxine (SYNTHROID, LEVOTHROID) 112 MCG tablet Take 112 mcg by mouth daily before breakfast. RX'ed Dr.Balan  . lisinopril (PRINIVIL,ZESTRIL) 2.5 MG tablet TAKE ONE TABLET BY MOUTH ONCE DAILY TO CONTROL BLOOD PRESSURE  . polyethylene glycol powder (GLYCOLAX/MIRALAX) powder 17 grams in 8oz of water or juice now and if no Bowel Movement by bedtime, repeat Miralax dose. If this is  unsuccessful call office.  . sertraline (ZOLOFT) 50 MG tablet TAKE ONE TABLET DAILY FOR DEPRESSION   No facility-administered encounter medications on file as of 06/23/2018.     Review of Systems:  Review of Systems  Constitutional: Positive for malaise/fatigue. Negative for chills, fever and weight loss.  HENT: Negative for congestion.   Eyes: Negative for blurred vision.  Respiratory: Negative for cough and shortness of breath.   Cardiovascular: Negative for chest pain, palpitations and leg swelling.       Pain over varicose vein right leg  Gastrointestinal: Positive for constipation. Negative for abdominal pain, blood in stool, diarrhea, melena, nausea and vomiting.  Genitourinary: Negative for dysuria.  Musculoskeletal: Negative for falls and joint pain.       Walks with walker  Neurological: Positive for focal weakness. Negative for dizziness, loss of consciousness and weakness.  Psychiatric/Behavioral: Positive for memory loss. Negative for depression. The patient is not nervous/anxious and does not have insomnia.     Health Maintenance  Topic Date Due  . TETANUS/TDAP  12/08/1945  . INFLUENZA VACCINE  07/31/2018  . DEXA SCAN  Completed  . PNA vac Low Risk Adult  Completed    Physical Exam: Vitals:   06/23/18 1409  BP: 120/70  Pulse: 67  Temp: 98.4 F (36.9 C)  TempSrc: Oral  SpO2: 96%  Weight: 163 lb (73.9 kg)  Height: 5\' 1"  (1.549 m)   Body mass index is 30.8 kg/m. Physical Exam  Constitutional: She appears well-developed. No distress.  Outfit mismatched, wearing her slippers  Cardiovascular: Normal rate, regular rhythm, normal heart sounds and intact distal pulses.  Pulmonary/Chest: Effort normal and breath sounds normal. No respiratory distress.  No palpable mass in left breast area of concern per pt  Abdominal: Bowel sounds are normal. She exhibits no distension. There is no tenderness.  Musculoskeletal: Normal range of motion.  Neurological: She is alert.   Oriented to person and place  Skin: Skin is warm and dry.  Psychiatric: She has a normal mood and affect.    Labs reviewed: Basic Metabolic Panel: Recent Labs    02/25/18 0937  NA 143  K 3.9  CL 107  CO2 29  GLUCOSE 91  BUN 19  CREATININE 0.99*  CALCIUM 9.6  TSH 2.61   Liver Function Tests: Recent Labs    02/25/18 0937  AST 27  ALT 19  BILITOT 0.8  PROT 6.9   No results for input(s): LIPASE, AMYLASE in the last 8760 hours. No results for input(s): AMMONIA in the last 8760 hours. CBC: Recent Labs    02/25/18 0937 03/04/18 1153  WBC 3.4* 4.7  NEUTROABS 2,050  --   HGB 11.3* 11.7  HCT 34.2* 36.3  MCV 91.2 91.9  PLT 174 187   Lipid Panel: Recent Labs    02/25/18 0937  CHOL 151  HDL 50*  LDLCALC 85  TRIG 74  CHOLHDL 3.0   Lab Results  Component Value Date   HGBA1C 5.0 11/09/2015    Procedures since last visit: US Breast Ltd Uni Left Inc Axilla  Result Date: 06/10/2018 CLINICAL DATA:  Patient underwent stereotactic guided core biopsy of LEFT breast calcifications in May 2017, which revealed high-grade ductal carcinoma in situ with comedo necrosis and calcifications. Tumor was ER positive PR positive. Patient was taking anastrozole until it was discontinued secondary to concerns regarding osteoporosis. Patient continues on observation alone at this point. The patient reports feeling a mass in the UPPER LEFT breast. EXAM: DIGITAL DIAGNOSTIC BILATERAL MAMMOGRAM WITH CAD AND TOMO ULTRASOUND LEFT BREAST COMPARISON:  Previous exam(s). ACR Breast Density Category b: There are scattered areas of fibroglandular density. FINDINGS: Within the UPPER-OUTER QUADRANT of the LEFT breast, there are numerous faint linear calcifications measuring 7.6 x 4.9 x 7.3 centimeters. Within the central aspect of the region of calcifications, there is an irregular mass with irregular margins, centrally containing the tissue marker clip after biopsy. Mammographic images were processed with  CAD. On physical exam, I palpate a discrete mass in the 1 o'clock location of the LEFT breast. Targeted ultrasound is performed, showing an irregular mass with irregular margins in the 1 o'clock location of the LEFT breast 5 centimeters from the nipple. There are mixed posterior acoustic features. Internal blood flow is noted on Doppler evaluation. Mass measures 2.6 x 2.1 x 1.4 centimeters. Evaluation of the LEFT axilla shows a single lymph node with focally thickened cortex. The cortex measures up to 6 millimeters in this region. Other lymph nodes have a normal appearance. IMPRESSION: Suspect progression of disease with increased suspicious microcalcifications and interval development of a mass in the 1 o'clock location of the LEFT breast. Possible metastasis to a LOWER LEFT axillary lymph node. RECOMMENDATION: Discussion with Dr. Jana Hakim regarding treatment options. I have discussed the findings and recommendations with the patient and her sister. Results were also provided in writing at the conclusion of the visit. If applicable, a reminder letter will be sent to the patient regarding the next appointment. BI-RADS CATEGORY  6: Known biopsy-proven malignancy. Electronically Signed   By: Nolon Nations M.D.   On: 06/10/2018 12:11   Mm Diag Breast Tomo Bilateral  Result Date: 06/10/2018 CLINICAL DATA:  Patient underwent stereotactic guided core biopsy of LEFT breast calcifications in May 2017, which revealed high-grade ductal carcinoma in situ with comedo necrosis and calcifications. Tumor was ER positive PR positive. Patient was taking anastrozole until it was discontinued secondary to concerns regarding osteoporosis. Patient continues on observation alone at this point. The patient reports feeling a mass in the UPPER LEFT breast. EXAM: DIGITAL DIAGNOSTIC BILATERAL MAMMOGRAM WITH CAD AND TOMO ULTRASOUND LEFT BREAST COMPARISON:  Previous exam(s). ACR Breast Density Category b: There are scattered areas of  fibroglandular density. FINDINGS: Within the UPPER-OUTER QUADRANT of the LEFT breast, there are numerous faint linear calcifications measuring 7.6 x 4.9 x 7.3 centimeters. Within the central aspect of the region of calcifications, there is an irregular mass with irregular margins, centrally containing the tissue marker clip after biopsy. Mammographic images were processed with CAD. On physical exam, I palpate a discrete mass in the 1 o'clock location of the LEFT breast. Targeted ultrasound is performed, showing an irregular mass with irregular margins  in the 1 o'clock location of the LEFT breast 5 centimeters from the nipple. There are mixed posterior acoustic features. Internal blood flow is noted on Doppler evaluation. Mass measures 2.6 x 2.1 x 1.4 centimeters. Evaluation of the LEFT axilla shows a single lymph node with focally thickened cortex. The cortex measures up to 6 millimeters in this region. Other lymph nodes have a normal appearance. IMPRESSION: Suspect progression of disease with increased suspicious microcalcifications and interval development of a mass in the 1 o'clock location of the LEFT breast. Possible metastasis to a LOWER LEFT axillary lymph node. RECOMMENDATION: Discussion with Dr. Jana Hakim regarding treatment options. I have discussed the findings and recommendations with the patient and her sister. Results were also provided in writing at the conclusion of the visit. If applicable, a reminder letter will be sent to the patient regarding the next appointment. BI-RADS CATEGORY  6: Known biopsy-proven malignancy. Electronically Signed   By: Nolon Nations M.D.   On: 06/10/2018 12:11    Assessment/Plan 1. Ductal carcinoma in situ (DCIS) of left breast -keep f/u with Dr. Jana Hakim on Friday -no palpable mass now -pt with concern about pain in breast but doesn't want anything done unless this is a constant bother  2. Vascular dementia without behavioral disturbance -progressing -needs  some assistance--recommended life alert and CNA help which will have to be hired help  3. Senile osteoporosis -cont fosamax weekly  4. Ataxia, late effect of cerebrovascular disease -ongoing, cont walker  5. Hypothyroidism, postsurgical -cont levothyroxine therapy  6. Constipation, slow transit -encouraged hydration, cont miralax  7. Varicose veins of leg with pain, right -elevate feet at rest  8. Need for Td vaccine -Td given today  Labs/tests ordered:  Td, next labs before  Next appt:  10/27/2018  Capri Raben L. Jari Dipasquale, D.O. [facility] [address], [place] [address] Cell Phone (Mon-Fri 8am-5pm):  [phone] On Call:  [phone] & follow prompts after 5pm & weekends Office Phone:  [phone] Office Fax:  [phone]

## 2018-06-23 NOTE — Patient Instructions (Signed)
Fosamax weekly in the morning before breakfast Atenolol, levothyroxine, miralax, zoloft each morning after breakfast Allopurinol, norvasc, lipitor, lisinopril, calcium, vitamin D every night, iron every other night

## 2018-06-25 NOTE — Progress Notes (Signed)
[place]  [phone] [phone]     ID: Shelia Marks DOB: [birth year]  MR#: [id number]  JSE#:[id number]  Patient Care Team: Gayland Curry, DO as PCP - General (Geriatric Medicine) Rutherford Guys, MD as Consulting Physician (Ophthalmology) Omunique Pederson, Virgie Dad, MD as Consulting Physician (Oncology) Jovita Kussmaul, MD as Consulting Physician (General Surgery) Jacelyn Pi, MD as Consulting Physician (Endocrinology) PCP: Gayland Curry, DO GYN: OTHER MD:  CHIEF COMPLAINT: Estrogen receptor positive breast cancer  CURRENT TREATMENT: Observation   BREAST CANCER HISTORY: From the original intake note:  "[name]" had bilateral screening mammography at the [facility] 06/07/2016 showing calcifications in the left breast. She was recalled  05/15/2016 for left diagnostic mammography. This showed the breast density to be category be. In the upper outer left breast there was an area of suspicious calcifications measuring 1.7 cm.  Biopsy of this area was obtained 05/21/2016, and showed ([id number]) ductal carcinoma in situ, high-grade, estrogen receptor 100% positive, progesterone receptor 5% positive, WITH strong staining intensity.  Her subsequent history is as detailed below.  INTERVAL HISTORY: Shelia Marks returns today for follow-up of her noninvasive breast cancer accompanied by her daughter. She continues under observation. She was previously on anastrozole. This was discontinued due to significant arthralgias and myalgias. The patient notes that she continues to have leg pain. She doesn't remember if her arthralgias improved after discontinuing this.   Since her last visit, she underwent diagnostic bilateral mammography with CAD and tomography and left ultrasonography on 06/10/2018 at [place] showing: breast density category B. Mammographically, there were faint calcifications measuring 7.6 x 4.9 x 7.3 cm in the upper outer  quadrant of the left breast. In the central aspect of the calcifications, there is an irregular mass with irregular margins. Sonographically, the mass measures 2.6 x 2.1 x 1.4 cm with internal vascularization located at the 1 o'clock position upper inner quadrant of the left breast. The left axilla showed a single lymph node with focally thickened cortex.  This is felt to be very suspicious for disease progression and possible involvement of lower left axillary lymph node.   REVIEW OF SYSTEMS: Hollace reports that she uses a walker. It is not as easy for her to get around. She denies unusual headaches, visual changes, nausea, vomiting, or dizziness. There has been no unusual cough, phlegm production, or pleurisy. This been no change in bowel or bladder habits. She denies unexplained fatigue or unexplained weight loss, bleeding, rash, or fever. A detailed review of systems was otherwise stable.    PAST MEDICAL HISTORY: Past Medical History:  Diagnosis Date  . Alzheimer's disease   . Cancer ([place])   . Disorder of bone and cartilage, unspecified   . Dizziness and giddiness   . Hyperlipidemia LDL goal < 100   . Hypertension   . Hypothyroidism   . Malignant neoplasm of thyroid gland ([place])   . Nontoxic uninodular goiter   . Other malaise and fatigue   . Other symptoms involving cardiovascular system   . Phlebitis and thrombophlebitis of unspecified site   . Senile osteoporosis   . Sleep related leg cramps   . Stroke ([place])   . Unspecified disorder of lipoid metabolism   . Unspecified hereditary and idiopathic peripheral neuropathy   . Varicose veins of lower extremities with inflammation     PAST SURGICAL HISTORY: Past Surgical History:  Procedure Laterality Date  . ABDOMINAL HYSTERECTOMY     DR HUGES  . BREAST BIOPSY Left  05/21/2016   malignant  . CATRACT SURGERY  2008  . COLON SURGERY    . KNEE SURGERY     right  . PARATHYROID ADENOMA REMOVED  1983  . THYROID SURGERY  July 20, 2011    FAMILY HISTORY Family History  Problem Relation Age of Onset  . Diabetes Brother   The patient's father died from liver problems in his 9s. The patient's mother died in her 41s from "natural causes". The patient had no brothers. She had 6 sisters, 2 of whom died from complications of diabetes and one from a ruptured aneurysm. There is no history of breast or ovarian cancer in the family.   GYNECOLOGIC HISTORY:  No LMP recorded. Patient has had a hysterectomy.  Shelia Marks does not remember how she was when she had her first period. She never carried a child to term. He is status post hysterectomy and at least one ovary was removed. She never took hormone replacement.   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     ADVANCED DIRECTIVES:  [name] [name] DAVIS IS HER HEALTHCARE PART OF ATTORNEY. [name] CAN BE REACHED AT [phone].   HEALTH MAINTENANCE: Social History   Tobacco Use  . Smoking status: Never Smoker  . Smokeless tobacco: Never Used  Substance Use Topics  . Alcohol use: No  . Drug use: No     No Known Allergies  Current Outpatient Medications  Medication Sig Dispense Refill  . alendronate (FOSAMAX) 70 MG tablet TAKE 1 TABLET (70 MG TOTAL) BY MOUTH EVERY 7 (SEVEN) DAYS. TAKE WITH A FULL GLASS OF WATER ON AN EMPTY STOMACH. 12 tablet 11  . allopurinol (ZYLOPRIM) 300 MG tablet TAKE 1 TABLET EVERY DAY 90 tablet 0  . amLODipine (NORVASC) 10 MG tablet TAKE 1 TABLET EVERY DAY 90 tablet 3  . aspirin EC 81 MG tablet Take 1 tablet (81 mg total) by mouth daily. 30 tablet 3  . atenolol (TENORMIN) 25 MG tablet TAKE 1 TABLET EVERY DAY 90 tablet 1  . atorvastatin (LIPITOR) 40 MG tablet TAKE 1 TABLET EVERY DAY 90 tablet 1  . calcium carbonate (TUMS - DOSED IN MG ELEMENTAL CALCIUM) 500 MG chewable tablet Chew 1 tablet by mouth as needed for indigestion or heartburn.    .  Cholecalciferol (VITAMIN D) 2000 units CAPS Take 1 capsule (2,000 Units total) by mouth daily. 30 capsule 2  . iron polysaccharides (NIFEREX) 150 MG capsule Take 1 capsule (150 mg total) by mouth every other day. 15 capsule 3  . levothyroxine (SYNTHROID, LEVOTHROID) 112 MCG tablet Take 112 mcg by mouth daily before breakfast. RX'ed Dr.Balan    . lisinopril (PRINIVIL,ZESTRIL) 2.5 MG tablet TAKE ONE TABLET BY MOUTH ONCE DAILY TO CONTROL BLOOD PRESSURE 90 tablet 3  . polyethylene glycol powder (GLYCOLAX/MIRALAX) powder 17 grams in 8oz of water or juice now and if no Bowel Movement by bedtime, repeat Miralax dose. If this is unsuccessful call office. 119 g 0  . sertraline (ZOLOFT) 50 MG tablet TAKE ONE TABLET DAILY FOR DEPRESSION 90 tablet 1   No current facility-administered medications for this visit.     OBJECTIVE: Older white woman using a walker   Vitals:   06/27/18 1510  BP: (!) 150/77  Pulse: 70  Resp: 18  Temp: 98.9 F (37.2 C)  SpO2: 100%     Body mass index is 31.1 kg/m.  ECOG FS:2 - Symptomatic, <50% confined to bed  Sclerae unicteric, bilateral arcus senilis No cervical or supraclavicular adenopathy Lungs no rales or rhonchi Heart regular rate and rhythm Abd soft, nontender, positive bowel sounds MSK no focal spinal tenderness Neuro: nonfocal, appropriate affect Breasts: I do not palpate a mass in either breast or either axilla  LAB RESULTS:  CMP     Component Value Date/Time   NA 143 02/25/2018 0937   NA 143 06/01/2016 0958   K 3.9 02/25/2018 0937   CL 107 02/25/2018 0937   CO2 29 02/25/2018 0937   GLUCOSE 91 02/25/2018 0937   BUN 19 02/25/2018 0937   BUN 21 06/01/2016 0958   CREATININE 0.99 (H) 02/25/2018 0937   CALCIUM 9.6 02/25/2018 0937   PROT 6.9 02/25/2018 0937   PROT 6.8 11/09/2015 1013   ALBUMIN 4.0 11/07/2016 0952   ALBUMIN 4.1 11/09/2015 1013   AST 27 02/25/2018 0937   ALT 19 02/25/2018 0937   ALKPHOS 76 11/07/2016 0952   BILITOT 0.8  02/25/2018 0937   BILITOT 0.5 11/09/2015 1013   GFRNONAA 51 (L) 02/25/2018 0937   GFRAA 59 (L) 02/25/2018 0937    INo results found for: SPEP, UPEP  Lab Results  Component Value Date   WBC 4.7 03/04/2018   NEUTROABS 2,050 02/25/2018   HGB 11.7 03/04/2018   HCT 36.3 03/04/2018   MCV 91.9 03/04/2018   PLT 187 03/04/2018      Chemistry      Component Value Date/Time   NA 143 02/25/2018 0937   NA 143 06/01/2016 0958   K 3.9 02/25/2018 0937   CL 107 02/25/2018 0937   CO2 29 02/25/2018 0937   BUN 19 02/25/2018 0937   BUN 21 06/01/2016 0958   CREATININE 0.99 (H) 02/25/2018 0937      Component Value Date/Time   CALCIUM 9.6 02/25/2018 0937   ALKPHOS 76 11/07/2016 0952   AST 27 02/25/2018 0937   ALT 19 02/25/2018 0937   BILITOT 0.8 02/25/2018 0937   BILITOT 0.5 11/09/2015 1013       No results found for: LABCA2  No components found for: OXBDZ329  No results for input(s): INR in the last 168 hours.  Urinalysis    Component Value Date/Time   COLORURINE STRAW (A) 03/09/2016 2001   APPEARANCEUR CLEAR 03/09/2016 2001   LABSPEC 1.006 03/09/2016 2001   PHURINE 7.5 03/09/2016 2001   GLUCOSEU NEGATIVE 03/09/2016 2001   HGBUR NEGATIVE 03/09/2016 2001   [place] NEGATIVE 03/09/2016 2001   [place] 03/09/2016 2001   PROTEINUR NEGATIVE 03/09/2016 2001   UROBILINOGEN 0.2 03/05/2014 2055   NITRITE NEGATIVE 03/09/2016 2001   LEUKOCYTESUR NEGATIVE 03/09/2016 2001    STUDIES: US Breast Ltd Uni Left Inc Axilla  Result Date: 06/10/2018 CLINICAL DATA:  Patient underwent stereotactic guided core biopsy of LEFT breast calcifications in May 2017, which revealed high-grade ductal carcinoma in situ with comedo necrosis and calcifications. Tumor was ER positive PR positive. Patient was taking anastrozole until it was discontinued secondary to concerns regarding osteoporosis. Patient continues on observation alone at this point. The patient reports feeling a mass in the  UPPER LEFT breast. EXAM: DIGITAL DIAGNOSTIC BILATERAL MAMMOGRAM WITH CAD AND TOMO ULTRASOUND LEFT BREAST COMPARISON:  Previous exam(s). ACR Breast Density Category b: There are scattered areas of fibroglandular density. FINDINGS: Within the UPPER-OUTER QUADRANT of the LEFT breast, there are numerous faint linear calcifications measuring 7.6 x 4.9 x 7.3 centimeters. Within the central aspect of the region  of calcifications, there is an irregular mass with irregular margins, centrally containing the tissue marker clip after biopsy. Mammographic images were processed with CAD. On physical exam, I palpate a discrete mass in the 1 o'clock location of the LEFT breast. Targeted ultrasound is performed, showing an irregular mass with irregular margins in the 1 o'clock location of the LEFT breast 5 centimeters from the nipple. There are mixed posterior acoustic features. Internal blood flow is noted on Doppler evaluation. Mass measures 2.6 x 2.1 x 1.4 centimeters. Evaluation of the LEFT axilla shows a single lymph node with focally thickened cortex. The cortex measures up to 6 millimeters in this region. Other lymph nodes have a normal appearance. IMPRESSION: Suspect progression of disease with increased suspicious microcalcifications and interval development of a mass in the 1 o'clock location of the LEFT breast. Possible metastasis to a LOWER LEFT axillary lymph node. RECOMMENDATION: Discussion with Dr. Jana Hakim regarding treatment options. I have discussed the findings and recommendations with the patient and her sister. Results were also provided in writing at the conclusion of the visit. If applicable, a reminder letter will be sent to the patient regarding the next appointment. BI-RADS CATEGORY  6: Known biopsy-proven malignancy. Electronically Signed   By: Nolon Nations M.D.   On: 06/10/2018 12:11   Mm Diag Breast Tomo Bilateral  Result Date: 06/10/2018 CLINICAL DATA:  Patient underwent stereotactic guided core  biopsy of LEFT breast calcifications in May 2017, which revealed high-grade ductal carcinoma in situ with comedo necrosis and calcifications. Tumor was ER positive PR positive. Patient was taking anastrozole until it was discontinued secondary to concerns regarding osteoporosis. Patient continues on observation alone at this point. The patient reports feeling a mass in the UPPER LEFT breast. EXAM: DIGITAL DIAGNOSTIC BILATERAL MAMMOGRAM WITH CAD AND TOMO ULTRASOUND LEFT BREAST COMPARISON:  Previous exam(s). ACR Breast Density Category b: There are scattered areas of fibroglandular density. FINDINGS: Within the UPPER-OUTER QUADRANT of the LEFT breast, there are numerous faint linear calcifications measuring 7.6 x 4.9 x 7.3 centimeters. Within the central aspect of the region of calcifications, there is an irregular mass with irregular margins, centrally containing the tissue marker clip after biopsy. Mammographic images were processed with CAD. On physical exam, I palpate a discrete mass in the 1 o'clock location of the LEFT breast. Targeted ultrasound is performed, showing an irregular mass with irregular margins in the 1 o'clock location of the LEFT breast 5 centimeters from the nipple. There are mixed posterior acoustic features. Internal blood flow is noted on Doppler evaluation. Mass measures 2.6 x 2.1 x 1.4 centimeters. Evaluation of the LEFT axilla shows a single lymph node with focally thickened cortex. The cortex measures up to 6 millimeters in this region. Other lymph nodes have a normal appearance. IMPRESSION: Suspect progression of disease with increased suspicious microcalcifications and interval development of a mass in the 1 o'clock location of the LEFT breast. Possible metastasis to a LOWER LEFT axillary lymph node. RECOMMENDATION: Discussion with Dr. Jana Hakim regarding treatment options. I have discussed the findings and recommendations with the patient and her sister. Results were also provided in  writing at the conclusion of the visit. If applicable, a reminder letter will be sent to the patient regarding the next appointment. BI-RADS CATEGORY  6: Known biopsy-proven malignancy. Electronically Signed   By: Nolon Nations M.D.   On: 06/10/2018 12:11     ELIGIBLE FOR AVAILABLE RESEARCH PROTOCOL: no  ASSESSMENT: 84 y.o. [place] woman status post left breast upper outer  quadrant biopsy 05/21/2016 for ductal carcinoma in situ, high-grade, estrogen and progesterone receptor positive  (1) opted against surgery   (2) started anastrozole neoadjuvantly 06/13/2016, discontinued June 2018 due to osteoporosis concerns  (a)  bone density 11/16/2016 shows a T score of -4.7 (osteoporosis).   (3) mammography/ultrasonography 06/10/2018 shows evidence of disease progression, possible lymph node extension  (4) letrozole started 06/27/2018   PLAN: Ms. Racey is now a little over 2 years out from definitive diagnosis of her breast cancer, which was at that time not invasive.  She did not tolerate anastrozole well.  She has been off treatment for about a year.  She denies evidence of disease progression.  I spent approximately 30 minutes with the patient in her sister with most of that time spent discussing her treatment options.  Certainly she could go for lumpectomy and sentinel lymph node sampling.  Given her age and comorbidities that would be complex.  Instead we are going to resume antiestrogens.  I am going to avoid tamoxifen initially.  I am concerned about blood clots in this elderly woman.  Instead we will try letrozole.  We reviewed the possible toxicity side effects and complications of that agent.  She will see [place] in approximately 2 months.  If she is tolerating letrozole well the plan will be to repeat an ultrasound approximately 4 months from now  If she does not tolerate letrozole well we will consider fulvestrant.  The patient has a good understanding of her options.  She is  comfortable with the plan.  There are some transportation issues and I have let her know that our social workers can assist in that regard  She knows to call for any other problems that may develop before the next visit here.    Colburn Asper, Virgie Dad, MD  06/27/18 4:00 PM Medical Oncology and Hematology [facility] [address] [place], [place] [address] Tel. [phone]    Fax. [phone]  Alice Rieger, am acting as scribe for Chauncey Cruel MD.  I, Lurline Del MD, have reviewed the above documentation for accuracy and completeness, and I agree with the above.

## 2018-06-27 ENCOUNTER — Telehealth: Payer: Self-pay | Admitting: Oncology

## 2018-06-27 ENCOUNTER — Inpatient Hospital Stay: Payer: Medicare Other | Attending: Oncology | Admitting: Oncology

## 2018-06-27 VITALS — BP 150/77 | HR 70 | Temp 98.9°F | Resp 18 | Ht 61.0 in | Wt 164.6 lb

## 2018-06-27 DIAGNOSIS — I10 Essential (primary) hypertension: Secondary | ICD-10-CM | POA: Insufficient documentation

## 2018-06-27 DIAGNOSIS — Z85850 Personal history of malignant neoplasm of thyroid: Secondary | ICD-10-CM | POA: Insufficient documentation

## 2018-06-27 DIAGNOSIS — E785 Hyperlipidemia, unspecified: Secondary | ICD-10-CM | POA: Diagnosis not present

## 2018-06-27 DIAGNOSIS — E039 Hypothyroidism, unspecified: Secondary | ICD-10-CM | POA: Diagnosis not present

## 2018-06-27 DIAGNOSIS — M810 Age-related osteoporosis without current pathological fracture: Secondary | ICD-10-CM | POA: Diagnosis not present

## 2018-06-27 DIAGNOSIS — Z90710 Acquired absence of both cervix and uterus: Secondary | ICD-10-CM | POA: Diagnosis not present

## 2018-06-27 DIAGNOSIS — Z79811 Long term (current) use of aromatase inhibitors: Secondary | ICD-10-CM | POA: Insufficient documentation

## 2018-06-27 DIAGNOSIS — Z79899 Other long term (current) drug therapy: Secondary | ICD-10-CM | POA: Diagnosis not present

## 2018-06-27 DIAGNOSIS — D0512 Intraductal carcinoma in situ of left breast: Secondary | ICD-10-CM | POA: Diagnosis not present

## 2018-06-27 DIAGNOSIS — Z8673 Personal history of transient ischemic attack (TIA), and cerebral infarction without residual deficits: Secondary | ICD-10-CM | POA: Insufficient documentation

## 2018-06-27 DIAGNOSIS — Z170 Estrogen receptor positive status [ER+]: Secondary | ICD-10-CM | POA: Insufficient documentation

## 2018-06-27 NOTE — Telephone Encounter (Signed)
Gave avs and calendar ° °

## 2018-06-30 ENCOUNTER — Other Ambulatory Visit: Payer: Self-pay

## 2018-06-30 MED ORDER — LETROZOLE 2.5 MG PO TABS: 2.5000 mg | ORAL_TABLET | Freq: Every day | ORAL | 0 refills | Status: DC

## 2018-06-30 NOTE — Telephone Encounter (Signed)
Patient's caregiver call to state medication, Letrozole, did not get sent into pharmacy.  Medication sent to pharmacy, Bryon Lions aware.

## 2018-07-01 ENCOUNTER — Telehealth: Payer: Self-pay | Admitting: Internal Medicine

## 2018-07-01 NOTE — Telephone Encounter (Signed)
Left msg asking pt to confirm this AWV-S w/ nurse before seeing Dr. Mariea Clonts on 10/27/18. VDM (DD)

## 2018-08-22 ENCOUNTER — Other Ambulatory Visit: Payer: Self-pay | Admitting: Internal Medicine

## 2018-08-25 ENCOUNTER — Telehealth: Payer: Self-pay | Admitting: Oncology

## 2018-08-25 ENCOUNTER — Telehealth: Payer: Self-pay | Admitting: Adult Health

## 2018-08-25 ENCOUNTER — Inpatient Hospital Stay: Payer: Medicare Other | Attending: Oncology | Admitting: Adult Health

## 2018-08-25 ENCOUNTER — Inpatient Hospital Stay: Payer: Medicare Other

## 2018-08-25 ENCOUNTER — Encounter: Payer: Self-pay | Admitting: Adult Health

## 2018-08-25 VITALS — BP 182/80 | HR 65 | Temp 98.5°F | Resp 17 | Ht 61.0 in | Wt 164.1 lb

## 2018-08-25 DIAGNOSIS — Z8673 Personal history of transient ischemic attack (TIA), and cerebral infarction without residual deficits: Secondary | ICD-10-CM | POA: Insufficient documentation

## 2018-08-25 DIAGNOSIS — D0512 Intraductal carcinoma in situ of left breast: Secondary | ICD-10-CM

## 2018-08-25 DIAGNOSIS — Z170 Estrogen receptor positive status [ER+]: Secondary | ICD-10-CM | POA: Diagnosis not present

## 2018-08-25 DIAGNOSIS — E875 Hyperkalemia: Secondary | ICD-10-CM | POA: Diagnosis not present

## 2018-08-25 DIAGNOSIS — Z85850 Personal history of malignant neoplasm of thyroid: Secondary | ICD-10-CM | POA: Diagnosis not present

## 2018-08-25 DIAGNOSIS — Z79899 Other long term (current) drug therapy: Secondary | ICD-10-CM | POA: Diagnosis not present

## 2018-08-25 DIAGNOSIS — E039 Hypothyroidism, unspecified: Secondary | ICD-10-CM | POA: Diagnosis not present

## 2018-08-25 DIAGNOSIS — M810 Age-related osteoporosis without current pathological fracture: Secondary | ICD-10-CM | POA: Insufficient documentation

## 2018-08-25 DIAGNOSIS — Z79811 Long term (current) use of aromatase inhibitors: Secondary | ICD-10-CM | POA: Diagnosis not present

## 2018-08-25 DIAGNOSIS — Z90710 Acquired absence of both cervix and uterus: Secondary | ICD-10-CM | POA: Diagnosis not present

## 2018-08-25 DIAGNOSIS — I10 Essential (primary) hypertension: Secondary | ICD-10-CM | POA: Diagnosis not present

## 2018-08-25 LAB — CBC WITH DIFFERENTIAL/PLATELET
Basophils Absolute: 0 10*3/uL (ref 0.0–0.1)
Basophils Relative: 1 %
Eosinophils Absolute: 0.1 10*3/uL (ref 0.0–0.5)
Eosinophils Relative: 3 %
HCT: 35.4 % (ref 34.8–46.6)
Hemoglobin: 11.4 g/dL — ABNORMAL LOW (ref 11.6–15.9)
Lymphocytes Relative: 34 %
Lymphs Abs: 1.3 10*3/uL (ref 0.9–3.3)
MCH: 29.4 pg (ref 25.1–34.0)
MCHC: 32.3 g/dL (ref 31.5–36.0)
MCV: 91 fL (ref 79.5–101.0)
Monocytes Absolute: 0.4 10*3/uL (ref 0.1–0.9)
Monocytes Relative: 9 %
Neutro Abs: 2.1 10*3/uL (ref 1.5–6.5)
Neutrophils Relative %: 53 %
Platelets: 157 10*3/uL (ref 145–400)
RBC: 3.89 MIL/uL (ref 3.70–5.45)
RDW: 17.5 % — ABNORMAL HIGH (ref 11.2–14.5)
WBC: 3.9 10*3/uL (ref 3.9–10.3)

## 2018-08-25 LAB — COMPREHENSIVE METABOLIC PANEL
ALT: 17 U/L (ref 0–44)
AST: 24 U/L (ref 15–41)
Albumin: 3.9 g/dL (ref 3.5–5.0)
Alkaline Phosphatase: 64 U/L (ref 38–126)
Anion gap: 7 (ref 5–15)
BUN: 15 mg/dL (ref 8–23)
CO2: 27 mmol/L (ref 22–32)
Calcium: 10.1 mg/dL (ref 8.9–10.3)
Chloride: 108 mmol/L (ref 98–111)
Creatinine, Ser: 0.99 mg/dL (ref 0.44–1.00)
GFR calc Af Amer: 57 mL/min — ABNORMAL LOW (ref >60)
GFR calc non Af Amer: 49 mL/min — ABNORMAL LOW (ref >60)
Glucose, Bld: 81 mg/dL (ref 70–99)
Potassium: 4.1 mmol/L (ref 3.5–5.1)
Sodium: 142 mmol/L (ref 135–145)
Total Bilirubin: 0.6 mg/dL (ref 0.3–1.2)
Total Protein: 7.7 g/dL (ref 6.5–8.1)

## 2018-08-25 NOTE — Telephone Encounter (Signed)
Per GM, okay for 11/14 appt @ 1:30 pm.  Gave patient avs and calendar.

## 2018-08-25 NOTE — Patient Instructions (Signed)
Everything looks great.  Continue taking Letrozole.  You will undergo repeat ultrasound of the left breast at the end of October, and see Dr. Jana Hakim in mid November.  Please let us know if you have any concerns that arise prior to your next appointment with [place]!

## 2018-08-25 NOTE — Telephone Encounter (Signed)
[facility] aware that [facility] will call with appointment per 8/26 los.

## 2018-08-25 NOTE — Progress Notes (Signed)
[place]  [phone] Fax:([phone]) [phone]     ID: Shawnna Pancake DOB: 26-Sep-1926  MR#: [id number]  SNK#:[id number]  Patient Care Team: Gayland Curry, DO as PCP - General (Geriatric Medicine) Rutherford Guys, MD as Consulting Physician (Ophthalmology) Magrinat, Virgie Dad, MD as Consulting Physician (Oncology) Jovita Kussmaul, MD as Consulting Physician (General Surgery) Jacelyn Pi, MD as Consulting Physician (Endocrinology) PCP: Gayland Curry, DO GYN: OTHER MD:  CHIEF COMPLAINT: Estrogen receptor positive breast cancer  CURRENT TREATMENT: Observation   BREAST CANCER HISTORY: From the original intake note:  "Heba" had bilateral screening mammography at the [facility] 06/07/2016 showing calcifications in the left breast. She was recalled  05/15/2016 for left diagnostic mammography. This showed the breast density to be category be. In the upper outer left breast there was an area of suspicious calcifications measuring 1.7 cm.  Biopsy of this area was obtained 05/21/2016, and showed ([id number]) ductal carcinoma in situ, high-grade, estrogen receptor 100% positive, progesterone receptor 5% positive, WITH strong staining intensity.  Her subsequent history is as detailed below.  INTERVAL HISTORY: Soriah is here today for evaluation and f/u of her estrogen positive breast cancer.  She is doing well today.  She was previously on Anastrozole for her DCIS, but was unable to tolerate it.  She has since switched to Letrozole.  She is tolerating the letrozole much better.  She notes that she doesn't know that she is taking it.     REVIEW OF SYSTEMS: Yeva is otherwise feeling well.  She notes that her left breast feels different, but that is how it has always been.  She denies any other issues.  She says she is feeling her usual.  She denies an increase in arthralgias, nausea, vomiting, new pain, or any other concerns.  A detailed ROS is  otherwise non contributory.     PAST MEDICAL HISTORY: Past Medical History:  Diagnosis Date  . Alzheimer's disease   . Cancer ([place])   . Disorder of bone and cartilage, unspecified   . Dizziness and giddiness   . Hyperlipidemia LDL goal < 100   . Hypertension   . Hypothyroidism   . Malignant neoplasm of thyroid gland ([place])   . Nontoxic uninodular goiter   . Other malaise and fatigue   . Other symptoms involving cardiovascular system   . Phlebitis and thrombophlebitis of unspecified site   . Senile osteoporosis   . Sleep related leg cramps   . Stroke ([place])   . Unspecified disorder of lipoid metabolism   . Unspecified hereditary and idiopathic peripheral neuropathy   . Varicose veins of lower extremities with inflammation     PAST SURGICAL HISTORY: Past Surgical History:  Procedure Laterality Date  . ABDOMINAL HYSTERECTOMY     DR HUGES  . BREAST BIOPSY Left 05/21/2016   malignant  . CATRACT SURGERY  2008  . COLON SURGERY    . KNEE SURGERY     right  . PARATHYROID ADENOMA REMOVED  1983  . THYROID SURGERY  July 20, 2011    FAMILY HISTORY Family History  Problem Relation Age of Onset  . Diabetes Brother   The patient's father died from liver problems in his 47s. The patient's mother died in her 67s from "natural causes". The patient had no brothers. She had 6 sisters, 2 of whom died from complications of diabetes and one from a ruptured aneurysm. There is no history of breast or ovarian cancer in the family.   GYNECOLOGIC  HISTORY:  No LMP recorded. Patient has had a hysterectomy.  Artice does not remember how she was when she had her first period. She never carried a child to term. He is status post hysterectomy and at least one ovary was removed. She never took hormone replacement.   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     ADVANCED DIRECTIVES:  [name] [name]  DAVIS IS HER HEALTHCARE PART OF ATTORNEY. [name] CAN BE REACHED AT [phone].   HEALTH MAINTENANCE: Social History   Tobacco Use  . Smoking status: Never Smoker  . Smokeless tobacco: Never Used  Substance Use Topics  . Alcohol use: No  . Drug use: No     No Known Allergies  Current Outpatient Medications  Medication Sig Dispense Refill  . alendronate (FOSAMAX) 70 MG tablet TAKE 1 TABLET (70 MG TOTAL) BY MOUTH EVERY 7 (SEVEN) DAYS. TAKE WITH A FULL GLASS OF WATER ON AN EMPTY STOMACH. 12 tablet 11  . allopurinol (ZYLOPRIM) 300 MG tablet TAKE 1 TABLET EVERY DAY 90 tablet 0  . amLODipine (NORVASC) 10 MG tablet TAKE 1 TABLET EVERY DAY 90 tablet 3  . aspirin EC 81 MG tablet Take 1 tablet (81 mg total) by mouth daily. 30 tablet 3  . atenolol (TENORMIN) 25 MG tablet TAKE 1 TABLET EVERY DAY 90 tablet 1  . atorvastatin (LIPITOR) 40 MG tablet TAKE 1 TABLET EVERY DAY 90 tablet 1  . calcium carbonate (TUMS - DOSED IN MG ELEMENTAL CALCIUM) 500 MG chewable tablet Chew 1 tablet by mouth as needed for indigestion or heartburn.    . Cholecalciferol (VITAMIN D) 2000 units CAPS Take 1 capsule (2,000 Units total) by mouth daily. 30 capsule 2  . iron polysaccharides (NIFEREX) 150 MG capsule Take 1 capsule (150 mg total) by mouth every other day. 15 capsule 3  . letrozole (FEMARA) 2.5 MG tablet Take 1 tablet (2.5 mg total) by mouth daily. 90 tablet 0  . levothyroxine (SYNTHROID, LEVOTHROID) 112 MCG tablet Take 112 mcg by mouth daily before breakfast. RX'ed Dr.Balan    . lisinopril (PRINIVIL,ZESTRIL) 2.5 MG tablet TAKE ONE TABLET BY MOUTH ONCE DAILY TO CONTROL BLOOD PRESSURE 90 tablet 3  . polyethylene glycol powder (GLYCOLAX/MIRALAX) powder 17 grams in 8oz of water or juice now and if no Bowel Movement by bedtime, repeat Miralax dose. If this is unsuccessful call office. 119 g 0  . sertraline (ZOLOFT) 50 MG tablet TAKE ONE TABLET DAILY FOR DEPRESSION 90 tablet 1   No current facility-administered  medications for this visit.     OBJECTIVE:  Vitals:   08/25/18 1334  BP: (!) 182/80  Pulse: 65  Resp: 17  Temp: 98.5 F (36.9 C)  SpO2: 100%     Body mass index is 31.01 kg/m.    ECOG FS:2 - Symptomatic, <50% confined to bed GENERAL: Patient is a well appearing female in no acute distress HEENT:  Sclerae anicteric.  Oropharynx clear and moist. No ulcerations or evidence of oropharyngeal candidiasis. Neck is supple.  NODES:  No cervical, supraclavicular, or axillary lymphadenopathy palpated.  BREAST EXAM:  No definite mass felt in the left breast, however in the lateral breast there is a palpable density change LUNGS:  Clear to auscultation bilaterally.  No wheezes or rhonchi. HEART:  Regular rate and rhythm. No murmur appreciated. ABDOMEN:  Soft, nontender.  Positive, normoactive bowel sounds. No organomegaly  palpated. MSK:  No focal spinal tenderness to palpation. Full range of motion bilaterally in the upper extremities. EXTREMITIES:  No peripheral edema.   SKIN:  Clear with no obvious rashes or skin changes. No nail dyscrasia. NEURO:  Nonfocal. Well oriented.  Appropriate affect.    LAB RESULTS:  CMP     Component Value Date/Time   NA 142 08/25/2018 1249   NA 143 06/01/2016 0958   K 4.1 08/25/2018 1249   CL 108 08/25/2018 1249   CO2 27 08/25/2018 1249   GLUCOSE 81 08/25/2018 1249   BUN 15 08/25/2018 1249   BUN 21 06/01/2016 0958   CREATININE 0.99 08/25/2018 1249   CREATININE 0.99 (H) 02/25/2018 0937   CALCIUM 10.1 08/25/2018 1249   PROT 7.7 08/25/2018 1249   PROT 6.8 11/09/2015 1013   ALBUMIN 3.9 08/25/2018 1249   ALBUMIN 4.1 11/09/2015 1013   AST 24 08/25/2018 1249   ALT 17 08/25/2018 1249   ALKPHOS 64 08/25/2018 1249   BILITOT 0.6 08/25/2018 1249   BILITOT 0.5 11/09/2015 1013   GFRNONAA 49 (L) 08/25/2018 1249   GFRNONAA 51 (L) 02/25/2018 0937   GFRAA 57 (L) 08/25/2018 1249   GFRAA 59 (L) 02/25/2018 0937    INo results found for: SPEP, UPEP  Lab  Results  Component Value Date   WBC 3.9 08/25/2018   NEUTROABS 2.1 08/25/2018   HGB 11.4 (L) 08/25/2018   HCT 35.4 08/25/2018   MCV 91.0 08/25/2018   PLT 157 08/25/2018      Chemistry      Component Value Date/Time   NA 142 08/25/2018 1249   NA 143 06/01/2016 0958   K 4.1 08/25/2018 1249   CL 108 08/25/2018 1249   CO2 27 08/25/2018 1249   BUN 15 08/25/2018 1249   BUN 21 06/01/2016 0958   CREATININE 0.99 08/25/2018 1249   CREATININE 0.99 (H) 02/25/2018 0937      Component Value Date/Time   CALCIUM 10.1 08/25/2018 1249   ALKPHOS 64 08/25/2018 1249   AST 24 08/25/2018 1249   ALT 17 08/25/2018 1249   BILITOT 0.6 08/25/2018 1249   BILITOT 0.5 11/09/2015 1013       No results found for: LABCA2  No components found for: LABCA125  No results for input(s): INR in the last 168 hours.  Urinalysis    Component Value Date/Time   COLORURINE STRAW (A) 03/09/2016 2001   APPEARANCEUR CLEAR 03/09/2016 2001   LABSPEC 1.006 03/09/2016 2001   PHURINE 7.5 03/09/2016 2001   GLUCOSEU NEGATIVE 03/09/2016 2001   HGBUR NEGATIVE 03/09/2016 2001   [place] NEGATIVE 03/09/2016 2001   [place] 03/09/2016 2001   PROTEINUR NEGATIVE 03/09/2016 2001   UROBILINOGEN 0.2 03/05/2014 2055   NITRITE NEGATIVE 03/09/2016 2001   LEUKOCYTESUR NEGATIVE 03/09/2016 2001    STUDIES: No results found.   ELIGIBLE FOR AVAILABLE RESEARCH PROTOCOL: no  ASSESSMENT: 84 y.o. [place] woman status post left breast upper outer quadrant biopsy 05/21/2016 for ductal carcinoma in situ, high-grade, estrogen and progesterone receptor positive  (1) opted against surgery   (2) started anastrozole neoadjuvantly 06/13/2016, discontinued June 2018 due to osteoporosis concerns  (a)  bone density 11/16/2016 shows a T score of -4.7 (osteoporosis)-on fosamax.   (3) mammography/ultrasonography 06/10/2018 shows evidence of disease progression, possible lymph node extension  (4) letrozole started  06/27/2018 (ultrasound in 09/2018 per Dr. Jana Hakim note from 05/2018)   PLAN: Ms. Lewing is doing well today.  She is tolerating the Letrozole well and I  recommended that she continue taking this daily.  She will undergo repeat mammogram and ultrasound in 2 months.  She will see Dr. Jana Hakim after this is done.    The patient has a good understanding of her options.  She is comfortable with the plan.  She knows to call for any other problems that may develop before the next visit here.  A total of (20) minutes of face-to-face time was spent with this patient with greater than 50% of that time in counseling and care-coordination.   Wilber Bihari, NP  08/26/18 8:24 AM Medical Oncology and Hematology [facility] [address] [place] [facility] [place], [place] [address] Tel. [phone]    Fax. [phone]

## 2018-08-26 LAB — CANCER ANTIGEN 27.29: CA 27.29: 36.1 U/mL (ref 0.0–38.6)

## 2018-09-22 ENCOUNTER — Other Ambulatory Visit: Payer: Self-pay | Admitting: Oncology

## 2018-10-20 ENCOUNTER — Other Ambulatory Visit: Payer: Self-pay | Admitting: *Deleted

## 2018-10-20 MED ORDER — ALLOPURINOL 300 MG PO TABS: 300.0000 mg | ORAL_TABLET | Freq: Every day | ORAL | 1 refills | Status: DC

## 2018-10-20 MED ORDER — SERTRALINE HCL 50 MG PO TABS: ORAL_TABLET | ORAL | 1 refills | Status: DC

## 2018-10-20 MED ORDER — ATORVASTATIN CALCIUM 40 MG PO TABS: 40.0000 mg | ORAL_TABLET | Freq: Every day | ORAL | 1 refills | Status: DC

## 2018-10-20 MED ORDER — ATENOLOL 25 MG PO TABS: 25.0000 mg | ORAL_TABLET | Freq: Every day | ORAL | 1 refills | Status: DC

## 2018-10-20 NOTE — Telephone Encounter (Signed)
[facility] 

## 2018-10-21 ENCOUNTER — Other Ambulatory Visit: Payer: Self-pay | Admitting: Internal Medicine

## 2018-10-27 ENCOUNTER — Ambulatory Visit (INDEPENDENT_AMBULATORY_CARE_PROVIDER_SITE_OTHER): Payer: Medicare Other

## 2018-10-27 ENCOUNTER — Encounter: Payer: Self-pay | Admitting: Internal Medicine

## 2018-10-27 ENCOUNTER — Ambulatory Visit (INDEPENDENT_AMBULATORY_CARE_PROVIDER_SITE_OTHER): Payer: Medicare Other | Admitting: Internal Medicine

## 2018-10-27 VITALS — BP 128/70 | HR 68 | Temp 98.0°F | Ht 61.0 in | Wt 165.0 lb

## 2018-10-27 DIAGNOSIS — D0512 Intraductal carcinoma in situ of left breast: Secondary | ICD-10-CM

## 2018-10-27 DIAGNOSIS — Z23 Encounter for immunization: Secondary | ICD-10-CM | POA: Diagnosis not present

## 2018-10-27 DIAGNOSIS — I10 Essential (primary) hypertension: Secondary | ICD-10-CM

## 2018-10-27 DIAGNOSIS — Z0000 Encounter for general adult medical examination without abnormal findings: Secondary | ICD-10-CM

## 2018-10-27 DIAGNOSIS — I6300 Cerebral infarction due to thrombosis of unspecified precerebral artery: Secondary | ICD-10-CM

## 2018-10-27 DIAGNOSIS — M1732 Unilateral post-traumatic osteoarthritis, left knee: Secondary | ICD-10-CM

## 2018-10-27 DIAGNOSIS — M810 Age-related osteoporosis without current pathological fracture: Secondary | ICD-10-CM | POA: Diagnosis not present

## 2018-10-27 DIAGNOSIS — E890 Postprocedural hypothyroidism: Secondary | ICD-10-CM | POA: Diagnosis not present

## 2018-10-27 DIAGNOSIS — K5901 Slow transit constipation: Secondary | ICD-10-CM | POA: Diagnosis not present

## 2018-10-27 DIAGNOSIS — F0150 Vascular dementia without behavioral disturbance: Secondary | ICD-10-CM | POA: Diagnosis not present

## 2018-10-27 NOTE — Patient Instructions (Signed)
Ms. Orwick , Thank you for taking time to come for your Medicare Wellness Visit. I appreciate your ongoing commitment to your health goals. Please review the following plan we discussed and let me know if I can assist you in the future.   Screening recommendations/referrals: Colonoscopy excluded, over age 82 Mammogram excluded, over age 59 Bone Density up to date Recommended yearly ophthalmology/optometry visit for glaucoma screening and checkup Recommended yearly dental visit for hygiene and checkup  Vaccinations: Influenza vaccine high dose given today Pneumococcal vaccine up to date, completed Tdap vaccine up to date, due 06/23/2028 Shingles vaccine due, declined    Advanced directives: DNR in chart  Conditions/risks identified: fall risk  Next appointment: Tyson Dense, RN 11/02/2019 @ 10:30am   Preventive Care 65 Years and Older, Female Preventive care refers to lifestyle choices and visits with your health care provider that can promote health and wellness. What does preventive care include?  A yearly physical exam. This is also called an annual well check.  Dental exams once or twice a year.  Routine eye exams. Ask your health care provider how often you should have your eyes checked.  Personal lifestyle choices, including:  Daily care of your teeth and gums.  Regular physical activity.  Eating a healthy diet.  Avoiding tobacco and drug use.  Limiting alcohol use.  Practicing safe sex.  Taking low-dose aspirin every day.  Taking vitamin and mineral supplements as recommended by your health care provider. What happens during an annual well check? The services and screenings done by your health care provider during your annual well check will depend on your age, overall health, lifestyle risk factors, and family history of disease. Counseling  Your health care provider may ask you questions about your:  Alcohol use.  Tobacco use.  Drug use.  Emotional  well-being.  Home and relationship well-being.  Sexual activity.  Eating habits.  History of falls.  Memory and ability to understand (cognition).  Work and work Statistician.  Reproductive health. Screening  You may have the following tests or measurements:  Height, weight, and BMI.  Blood pressure.  Lipid and cholesterol levels. These may be checked every 5 years, or more frequently if you are over 60 years old.  Skin check.  Lung cancer screening. You may have this screening every year starting at age 46 if you have a 30-pack-year history of smoking and currently smoke or have quit within the past 15 years.  Fecal occult blood test (FOBT) of the stool. You may have this test every year starting at age 34.  Flexible sigmoidoscopy or colonoscopy. You may have a sigmoidoscopy every 5 years or a colonoscopy every 10 years starting at age 83.  Hepatitis C blood test.  Hepatitis B blood test.  Sexually transmitted disease (STD) testing.  Diabetes screening. This is done by checking your blood sugar (glucose) after you have not eaten for a while (fasting). You may have this done every 1-3 years.  Bone density scan. This is done to screen for osteoporosis. You may have this done starting at age 56.  Mammogram. This may be done every 1-2 years. Talk to your health care provider about how often you should have regular mammograms. Talk with your health care provider about your test results, treatment options, and if necessary, the need for more tests. Vaccines  Your health care provider may recommend certain vaccines, such as:  Influenza vaccine. This is recommended every year.  Tetanus, diphtheria, and acellular pertussis (Tdap, Td)  vaccine. You may need a Td booster every 10 years.  Zoster vaccine. You may need this after age 42.  Pneumococcal 13-valent conjugate (PCV13) vaccine. One dose is recommended after age 4.  Pneumococcal polysaccharide (PPSV23) vaccine. One  dose is recommended after age 2. Talk to your health care provider about which screenings and vaccines you need and how often you need them. This information is not intended to replace advice given to you by your health care provider. Make sure you discuss any questions you have with your health care provider. Document Released: 01/13/2016 Document Revised: 09/05/2016 Document Reviewed: 10/18/2015 Elsevier Interactive Patient Education  2017 [employer] Prevention in the Home Falls can cause injuries. They can happen to people of all ages. There are many things you can do to make your home safe and to help prevent falls. What can I do on the outside of my home?  Regularly fix the edges of walkways and driveways and fix any cracks.  Remove anything that might make you trip as you walk through a door, such as a raised step or threshold.  Trim any bushes or trees on the path to your home.  Use bright outdoor lighting.  Clear any walking paths of anything that might make someone trip, such as rocks or tools.  Regularly check to see if handrails are loose or broken. Make sure that both sides of any steps have handrails.  Any raised decks and porches should have guardrails on the edges.  Have any leaves, snow, or ice cleared regularly.  Use sand or salt on walking paths during winter.  Clean up any spills in your garage right away. This includes oil or grease spills. What can I do in the bathroom?  Use night lights.  Install grab bars by the toilet and in the tub and shower. Do not use towel bars as grab bars.  Use non-skid mats or decals in the tub or shower.  If you need to sit down in the shower, use a plastic, non-slip stool.  Keep the floor dry. Clean up any water that spills on the floor as soon as it happens.  Remove soap buildup in the tub or shower regularly.  Attach bath mats securely with double-sided non-slip rug tape.  Do not have throw rugs and other  things on the floor that can make you trip. What can I do in the bedroom?  Use night lights.  Make sure that you have a light by your bed that is easy to reach.  Do not use any sheets or blankets that are too big for your bed. They should not hang down onto the floor.  Have a firm chair that has side arms. You can use this for support while you get dressed.  Do not have throw rugs and other things on the floor that can make you trip. What can I do in the kitchen?  Clean up any spills right away.  Avoid walking on wet floors.  Keep items that you use a lot in easy-to-reach places.  If you need to reach something above you, use a strong step stool that has a grab bar.  Keep electrical cords out of the way.  Do not use floor polish or wax that makes floors slippery. If you must use wax, use non-skid floor wax.  Do not have throw rugs and other things on the floor that can make you trip. What can I do with my stairs?  Do not leave  any items on the stairs.  Make sure that there are handrails on both sides of the stairs and use them. Fix handrails that are broken or loose. Make sure that handrails are as long as the stairways.  Check any carpeting to make sure that it is firmly attached to the stairs. Fix any carpet that is loose or worn.  Avoid having throw rugs at the top or bottom of the stairs. If you do have throw rugs, attach them to the floor with carpet tape.  Make sure that you have a light switch at the top of the stairs and the bottom of the stairs. If you do not have them, ask someone to add them for you. What else can I do to help prevent falls?  Wear shoes that:  Do not have high heels.  Have rubber bottoms.  Are comfortable and fit you well.  Are closed at the toe. Do not wear sandals.  If you use a stepladder:  Make sure that it is fully opened. Do not climb a closed stepladder.  Make sure that both sides of the stepladder are locked into place.  Ask  someone to hold it for you, if possible.  Clearly mark and make sure that you can see:  Any grab bars or handrails.  First and last steps.  Where the edge of each step is.  Use tools that help you move around (mobility aids) if they are needed. These include:  Canes.  Walkers.  Scooters.  Crutches.  Turn on the lights when you go into a dark area. Replace any light bulbs as soon as they burn out.  Set up your furniture so you have a clear path. Avoid moving your furniture around.  If any of your floors are uneven, fix them.  If there are any pets around you, be aware of where they are.  Review your medicines with your doctor. Some medicines can make you feel dizzy. This can increase your chance of falling. Ask your doctor what other things that you can do to help prevent falls. This information is not intended to replace advice given to you by your health care provider. Make sure you discuss any questions you have with your health care provider. Document Released: 10/13/2009 Document Revised: 05/24/2016 Document Reviewed: 01/21/2015 Elsevier Interactive Patient Education  2017 [employer].

## 2018-10-27 NOTE — Progress Notes (Addendum)
Subjective:   Shelia Marks is a 84 y.o. female who presents for Medicare Annual (Subsequent) preventive examination.  Last AWV-06/13/2017    Objective:     Vitals: BP 128/70 (BP Location: Left Arm, Patient Position: Sitting)   Pulse 68   Temp 98 F (36.7 C) (Oral)   Ht 5\' 1"  (1.549 m)   Wt 165 lb (74.8 kg)   SpO2 98%   BMI 31.18 kg/m   Body mass index is 31.18 kg/m.  Advanced Directives 10/27/2018 06/23/2018 03/04/2018 03/03/2018 06/13/2017 02/07/2017 11/05/2016  Does Patient Have a Medical Advance Directive? Yes Yes No No Yes No Yes  Type of Advance Directive Out of facility DNR (pink MOST or yellow form) Out of facility DNR (pink MOST or yellow form) - - Out of facility DNR (pink MOST or yellow form) - Out of facility DNR (pink MOST or yellow form);[place];Living will  Does patient want to make changes to medical advance directive? No - Patient declined No - Patient declined - - No - Patient declined No - Patient declined -  Copy of [place] in Chart? - - - - - - No - copy requested  Would patient like information on creating a medical advance directive? - - Yes (ED - Information included in AVS) No - Patient declined - - -  Pre-existing out of facility DNR order (yellow form or pink MOST form) Yellow form placed in chart (order not valid for inpatient use) Yellow form placed in chart (order not valid for inpatient use) - - Yellow form placed in chart (order not valid for inpatient use) - -    Tobacco Social History   Tobacco Use  Smoking Status Never Smoker  Smokeless Tobacco Never Used     Counseling given: Not Answered   Clinical Intake:  Pre-visit preparation completed: No  Pain : No/denies pain     Diabetes: No  How often do you need to have someone help you when you read instructions, pamphlets, or other written materials from your doctor or pharmacy?: 2 - Rarely What is the last grade level you completed in school?:  High School  Interpreter Needed?: No  Information entered by :: Darleene Cleaver, RN  Past Medical History:  Diagnosis Date  . Alzheimer's disease ([place])   . Cancer ([place])   . Disorder of bone and cartilage, unspecified   . Dizziness and giddiness   . Hyperlipidemia LDL goal < 100   . Hypertension   . Hypothyroidism   . Malignant neoplasm of thyroid gland ([place])   . Nontoxic uninodular goiter   . Other malaise and fatigue   . Other symptoms involving cardiovascular system   . Phlebitis and thrombophlebitis of unspecified site   . Senile osteoporosis   . Sleep related leg cramps   . Stroke ([place])   . Unspecified disorder of lipoid metabolism   . Unspecified hereditary and idiopathic peripheral neuropathy   . Varicose veins of lower extremities with inflammation    Past Surgical History:  Procedure Laterality Date  . ABDOMINAL HYSTERECTOMY     DR HUGES  . BREAST BIOPSY Left 05/21/2016   malignant  . CATRACT SURGERY  2008  . COLON SURGERY    . KNEE SURGERY     right  . PARATHYROID ADENOMA REMOVED  1983  . THYROID SURGERY  July 20, 2011   Family History  Problem Relation Age of Onset  . Diabetes Brother    Social History  Socioeconomic History  . Marital status: Widowed    Spouse name: Not on file  . Number of children: Not on file  . Years of education: Not on file  . Highest education level: Not on file  Occupational History  . Not on file  Social Needs  . Financial resource strain: Not hard at all  . Food insecurity:    Worry: Never true    Inability: Never true  . Transportation needs:    Medical: No    Non-medical: No  Tobacco Use  . Smoking status: Never Smoker  . Smokeless tobacco: Never Used  Substance and Sexual Activity  . Alcohol use: No  . Drug use: No  . Sexual activity: Not on file  Lifestyle  . Physical activity:    Days per week: 0 days    Minutes per session: 0 min  . Stress: Only a little  Relationships  . Social connections:    Talks  on phone: More than three times a week    Gets together: More than three times a week    Attends religious service: More than 4 times per year    Active member of club or organization: No    Attends meetings of clubs or organizations: Never    Relationship status: Widowed  Other Topics Concern  . Not on file  Social History Narrative  . Not on file    Outpatient Encounter Medications as of 10/27/2018  Medication Sig  . alendronate (FOSAMAX) 70 MG tablet TAKE 1 TABLET (70 MG TOTAL) BY MOUTH EVERY 7 (SEVEN) DAYS. TAKE WITH A FULL GLASS OF WATER ON AN EMPTY STOMACH.  Marland Kitchen allopurinol (ZYLOPRIM) 300 MG tablet Take 1 tablet (300 mg total) by mouth daily.  Marland Kitchen amLODipine (NORVASC) 10 MG tablet TAKE 1 TABLET EVERY DAY  . aspirin EC 81 MG tablet Take 1 tablet (81 mg total) by mouth daily.  Marland Kitchen atenolol (TENORMIN) 25 MG tablet Take 1 tablet (25 mg total) by mouth daily.  Marland Kitchen atorvastatin (LIPITOR) 40 MG tablet Take 1 tablet (40 mg total) by mouth daily.  . calcium carbonate (TUMS - DOSED IN MG ELEMENTAL CALCIUM) 500 MG chewable tablet Chew 1 tablet by mouth as needed for indigestion or heartburn.  . Cholecalciferol (VITAMIN D) 2000 units CAPS Take 1 capsule (2,000 Units total) by mouth daily.  . iron polysaccharides (NIFEREX) 150 MG capsule Take 1 capsule (150 mg total) by mouth every other day.  . letrozole (FEMARA) 2.5 MG tablet TAKE 1 TABLET BY MOUTH EVERY DAY  . levothyroxine (SYNTHROID, LEVOTHROID) 112 MCG tablet Take 112 mcg by mouth daily before breakfast. RX'ed Dr.Balan  . lisinopril (PRINIVIL,ZESTRIL) 2.5 MG tablet TAKE ONE TABLET BY MOUTH ONCE DAILY TO CONTROL BLOOD PRESSURE  . polyethylene glycol powder (GLYCOLAX/MIRALAX) powder 17 grams in 8oz of water or juice now and if no Bowel Movement by bedtime, repeat Miralax dose. If this is unsuccessful call office.  . sertraline (ZOLOFT) 50 MG tablet Take one tablet by mouth once daily for depression   No facility-administered encounter medications  on file as of 10/27/2018.     Activities of Daily Living In your present state of health, do you have any difficulty performing the following activities: 10/27/2018  Hearing? Y  Vision? N  Difficulty concentrating or making decisions? Y  Walking or climbing stairs? Y  Dressing or bathing? N  Doing errands, shopping? Y  Preparing Food and eating ? N  Using the Toilet? N  In the past six  months, have you accidently leaked urine? Y  Do you have problems with loss of bowel control? Y  Managing your Medications? Y  Managing your Finances? Y  Housekeeping or managing your Housekeeping? Y  Some recent data might be hidden    Patient Care Team: Gayland Curry, DO as PCP - General (Geriatric Medicine) Rutherford Guys, MD as Consulting Physician (Ophthalmology) Magrinat, Virgie Dad, MD as Consulting Physician (Oncology) Jovita Kussmaul, MD as Consulting Physician (General Surgery) Jacelyn Pi, MD as Consulting Physician (Endocrinology)    Assessment:   This is a routine wellness examination for [place].  Exercise Activities and Dietary recommendations Current Exercise Habits: The patient does not participate in regular exercise at present, Exercise limited by: orthopedic condition(s)  Goals    . Maintain LIfestyle     Starting today pt will maintain lifestyle.        Fall Risk Fall Risk  10/27/2018 06/23/2018 02/27/2018 10/28/2017 06/13/2017  Falls in the past year? Yes No No No No  Number falls in past yr: 1 - - - -  Injury with Fall? No - - - -   Is the patient's home free of loose throw rugs in walkways, pet beds, electrical cords, etc?   yes      Grab bars in the bathroom? yes      Handrails on the stairs?   yes      Adequate lighting?   yes  Timed Get Up and Go performed: 22 seconds  Depression Screen PHQ 2/9 Scores 10/27/2018 06/23/2018 10/28/2017 06/13/2017  PHQ - 2 Score 0 0 0 0  PHQ- 9 Score - - - -     Cognitive Function MMSE - Mini Mental State Exam  10/27/2018 06/13/2017 06/01/2016 05/27/2014 06/08/2013  Orientation to time 2 2 5 3 3   Orientation to Place 5 5 5 4 5   Registration 3 3 3 3 3   Attention/ Calculation 0 0 3 3 3   Recall 3 0 0 0 1  Language- name 2 objects 2 2 2 2 2   Language- repeat 1 1 1 1 1   Language- follow 3 step command 3 2 2 3 3   Language- read & follow direction 0 1 1 1 1   Write a sentence 1 0 1 1 1   Copy design 1 1 0 1 0  Total score 21 17 23 22 23         Immunization History  Administered Date(s) Administered  . Influenza, High Dose Seasonal PF 10/05/2016, 10/28/2017, 10/27/2018  . Influenza,inj,Quad PF,6+ Mos 10/12/2013, 10/31/2015  . Pneumococcal Conjugate-13 08/27/2014  . Pneumococcal Polysaccharide-23 06/01/2016  . Td 06/23/2018    Qualifies for Shingles Vaccine? Due, declined  Screening Tests Health Maintenance  Topic Date Due  . TETANUS/TDAP  06/23/2028  . INFLUENZA VACCINE  Completed  . DEXA SCAN  Completed  . PNA vac Low Risk Adult  Completed    Cancer Screenings: Lung: Low Dose CT Chest recommended if Age 62-80 years, 30 pack-year currently smoking OR have quit w/in 15years. Patient does not qualify. Breast:  Up to date on Mammogram? Yes   Up to date of Bone Density/Dexa? Yes Colorectal: up to date  Additional Screenings:  Hepatitis C Screening: declined High dose flu vaccine due: given Hearing screen completed    Plan:    I have personally reviewed and addressed the Medicare Annual Wellness questionnaire and have noted the following in the patient's chart:  A. Medical and social history B. Use of alcohol, tobacco or illicit  drugs  C. Current medications and supplements D. Functional ability and status E.  Nutritional status F.  Physical activity G. Advance directives H. List of other physicians I.  Hospitalizations, surgeries, and ER visits in previous 12 months J.  [place] to include hearing, vision, cognitive, depression L. Referrals and appointments - none  In  addition, I have reviewed and discussed with patient certain preventive protocols, quality metrics, and best practice recommendations. A written personalized care plan for preventive services as well as general preventive health recommendations were provided to patient.  See attached scanned questionnaire for additional information.   Signed,   Tyson Dense, RN Nurse Health Advisor  Patient Concerns: None

## 2018-10-27 NOTE — Patient Instructions (Addendum)
Recommend tylenol arthritis 650mg  up to three times a day for the left knee pain.  No more than 5 tablets per day.    Be sure to drink at least 6 8oz glasses of water per day.

## 2018-10-27 NOTE — Progress Notes (Signed)
Location:  [facility] clinic Provider:  Larua Collier L. Mariea Clonts, D.O., C.M.D.  Code Status: DNR Goals of Care:  Advanced Directives 10/27/2018  Does Patient Have a Medical Advance Directive? Yes  Type of Advance Directive Out of facility DNR (pink MOST or yellow form)  Does patient want to make changes to medical advance directive? No - Patient declined  Copy of [place] in Chart? -  Would patient like information on creating a medical advance directive? -  Pre-existing out of facility DNR order (yellow form or pink MOST form) Yellow form placed in chart (order not valid for inpatient use)   Chief Complaint  Patient presents with  . Medical Management of Chronic Issues    67[facility] follow-up    HPI: Patient is a 84y.o. female seen today for medical management of chronic diseases.    Since we last met, pt saw Dr. MJana Hakim  She's had DCIS left breast upper outer quadrant, was high-grade, ER and PR positive.  She took anastrazole 06/13/16, but d/cd June 2018 due to osteoporosis.  11/17 bone density showed T score -4.7.  mammo and [place]6/11/19 showed disease progression with possible lymph node extension.  Letrozole was started 06/17/18.  She was tolerating it well when she saw LRia Comment NP at oncology 08/25/18.  She was for f/u mammo and [place] about two mos--scheduled 11/14.    Seen by SClarise Cruz RN here for AWV earlier today and MMSE now 21/30 (better than 17/30 on 10/27/18.  She was given her flu shot today.  Has new glasses.  Sees pretty good.  Needs them adjusted to stay up on her nose better.  Says she's achy all over.  Has arthritis.  Left knee is still bothering her.  Had three shots last year.  Tries to stick it out but sometimes it really hurts.  She's walking with her walker.  Needs knee surgery, but she's not a good candidate with her dementia.  She was given tylenol #3 for her pain.  Discussed tylenol arthritis instead.    No falls lately.    Weight is stable.    Past Medical  History:  Diagnosis Date  . Alzheimer's disease ([place]   . Cancer ([place]   . Disorder of bone and cartilage, unspecified   . Dizziness and giddiness   . Hyperlipidemia LDL goal < 100   . Hypertension   . Hypothyroidism   . Malignant neoplasm of thyroid gland ([place]   . Nontoxic uninodular goiter   . Other malaise and fatigue   . Other symptoms involving cardiovascular system   . Phlebitis and thrombophlebitis of unspecified site   . Senile osteoporosis   . Sleep related leg cramps   . [place]roke ([place]   . Unspecified disorder of lipoid metabolism   . Unspecified hereditary and idiopathic peripheral neuropathy   . Varicose veins of lower extremities with inflammation     Past Surgical History:  Procedure Laterality Date  . ABDOMINAL HYSTERECTOMY     DR HUGES  . BREAST BIOPSY Left 05/21/2016   malignant  . CATRACT SURGERY  2008  . COLON SURGERY    . KNEE SURGERY     right  . PARATHYROID ADENOMA REMOVED  1983  . THYROID SURGERY  July 20, 2011    No Known Allergies  Outpatient Encounter Medications as of 10/27/2018  Medication Sig  . alendronate (FOSAMAX) 70 MG tablet TAKE 1 TABLET (70 MG TOTAL) BY MOUTH EVERY 7 (SEVEN) DAYS. TAKE WITH A  FULL GLASS OF WATER ON AN EMPTY STOMACH.  Marland Kitchen allopurinol (ZYLOPRIM) 300 MG tablet Take 1 tablet (300 mg total) by mouth daily.  Marland Kitchen amLODipine (NORVASC) 10 MG tablet TAKE 1 TABLET EVERY DAY  . aspirin EC 81 MG tablet Take 1 tablet (81 mg total) by mouth daily.  Marland Kitchen atenolol (TENORMIN) 25 MG tablet Take 1 tablet (25 mg total) by mouth daily.  Marland Kitchen atorvastatin (LIPITOR) 40 MG tablet Take 1 tablet (40 mg total) by mouth daily.  . calcium carbonate (TUMS - DOSED IN MG ELEMENTAL CALCIUM) 500 MG chewable tablet Chew 1 tablet by mouth as needed for indigestion or heartburn.  . Cholecalciferol (VITAMIN D) 2000 units CAPS Take 1 capsule (2,000 Units total) by mouth daily.  . iron polysaccharides (NIFEREX) 150 MG capsule Take 1 capsule (150 mg total) by mouth  every other day.  . letrozole (FEMARA) 2.5 MG tablet TAKE 1 TABLET BY MOUTH EVERY DAY  . levothyroxine (SYNTHROID, LEVOTHROID) 112 MCG tablet Take 112 mcg by mouth daily before breakfast. RX'ed Dr.Balan  . lisinopril (PRINIVIL,ZESTRIL) 2.5 MG tablet TAKE ONE TABLET BY MOUTH ONCE DAILY TO CONTROL BLOOD PRESSURE  . polyethylene glycol powder (GLYCOLAX/MIRALAX) powder 17 grams in 8oz of water or juice now and if no Bowel Movement by bedtime, repeat Miralax dose. If this is unsuccessful call office.  . sertraline (ZOLOFT) 50 MG tablet Take one tablet by mouth once daily for depression   No facility-administered encounter medications on file as of 10/27/2018.     Review of Systems:  Review of Systems  Constitutional: Negative for chills, fever and malaise/fatigue.  HENT: Negative for congestion.   Eyes: Negative for blurred vision.       New glasses since I last saw her  Respiratory: Negative for cough and shortness of breath.   Cardiovascular: Negative for chest pain, palpitations and leg swelling.  Gastrointestinal: Negative for abdominal pain, blood in stool, constipation and melena.  Genitourinary: Negative for dysuria.  Musculoskeletal: Positive for joint pain. Negative for falls.  Skin: Negative for itching and rash.  Neurological: Positive for focal weakness. Negative for dizziness and loss of consciousness.       Prior stroke  Endo/Heme/Allergies: Does not bruise/bleed easily.  Psychiatric/Behavioral: Positive for memory loss. Negative for depression. The patient is not nervous/anxious and does not have insomnia.        Talks about wanting a pet to talk to    Health Maintenance  Topic Date Due  . TETANUS/TDAP  06/23/2028  . INFLUENZA VACCINE  Completed  . DEXA SCAN  Completed  . PNA vac Low Risk Adult  Completed    Physical Exam: Vitals:   10/27/18 1112  BP: 128/70  Pulse: 68  Temp: 98 F (36.7 C)  TempSrc: Oral  SpO2: 98%  Weight: 165 lb (74.8 kg)  Height: '5\' 1"'   (1.549 m)   Body mass index is 31.18 kg/m. Physical Exam  Constitutional: She appears well-developed. No distress.  HENT:  Head: Normocephalic and atraumatic.  Cardiovascular: Normal rate, regular rhythm, normal heart sounds and intact distal pulses.  Pulmonary/Chest: Effort normal and breath sounds normal. No respiratory distress.  Abdominal: Bowel sounds are normal.  Musculoskeletal: Normal range of motion.  Walks slowly with walker  Neurological: She is alert.  Skin: Skin is warm and dry. Capillary refill takes less than 2 seconds.  Psychiatric: She has a normal mood and affect.    Labs reviewed: Basic Metabolic Panel: Recent Labs    02/25/18 0937 08/25/18 1249  NA 143 142  K 3.9 4.1  CL 107 108  CO2 29 27  GLUCOSE 91 81  BUN 19 15  CREATININE 0.99* 0.99  CALCIUM 9.6 10.1  TSH 2.61  --    Liver Function Tests: Recent Labs    02/25/18 0937 08/25/18 1249  AST 27 24  ALT 19 17  ALKPHOS  --  64  BILITOT 0.8 0.6  PROT 6.9 7.7  ALBUMIN  --  3.9   No results for input(s): LIPASE, AMYLASE in the last 8760 hours. No results for input(s): AMMONIA in the last 8760 hours. CBC: Recent Labs    02/25/18 0937 03/04/18 1153 08/25/18 1249  WBC 3.4* 4.7 3.9  NEUTROABS 2,050  --  2.1  HGB 11.3* 11.7 11.4*  HCT 34.2* 36.3 35.4  MCV 91.2 91.9 91.0  PLT 174 187 157   Lipid Panel: Recent Labs    02/25/18 0937  CHOL 151  HDL 50*  LDLCALC 85  TRIG 74  CHOLHDL 3.0   Lab Results  Component Value Date   HGBA1C 5.0 11/09/2015   Assessment/Plan 1. Vascular dementia without behavioral disturbance ([place]) -continues to progress, her sister does the cooking and she reheats food in the microwave only b/c she was burning food and leaving items left on at risk of fire  -talked about wanting a pet but doubt she could take care of it at this point--wants someone to talk to  2. Ductal carcinoma in situ (DCIS) of left breast -cont letrozole per oncology and keep f/u US and  mammo, labs and appt as planned  3. Senile osteoporosis -ongoing, cont ca and vitamin D, encouraged weightbearing exercise, but she does very little physical activity, walks with walker  4. Hypothyroidism, postsurgical -cont levothyroxine,  Lab Results  Component Value Date   TSH 2.61 02/25/2018   5. Essential hypertension, benign -bp at goal with current therapy, cont norvasc, atenolol, lisinopril, hydrate, no dizziness  6. Constipation, slow transit -cont current bowel regimen primarily with hydration and prunes, but also has prn miralax  7. Post-traumatic osteoarthritis of left knee -encouraged tylenol arthritis, avoid tylenol #3 which may worsen her cognition and put her at risk of falls worse than she already is  Labs/tests ordered:  No orders of the defined types were placed in this encounter.  Next appt:  02/26/2019   Rhilee Currin L. Raphel Stickles, D.O. [facility] [address], [place] [address] Cell Phone (Mon-Fri 8am-5pm):  [phone] On Call:  [phone] & follow prompts after 5pm & weekends Office Phone:  [phone] Office Fax:  [phone]

## 2018-11-04 ENCOUNTER — Ambulatory Visit
Admission: RE | Admit: 2018-11-04 | Discharge: 2018-11-04 | Disposition: A | Discharge status: Home / Self Care | Payer: Medicare Other | Source: Ambulatory Visit | Attending: Adult Health | Admitting: Adult Health

## 2018-11-04 ENCOUNTER — Other Ambulatory Visit: Payer: Medicare Other

## 2018-11-04 DIAGNOSIS — R921 Mammographic calcification found on diagnostic imaging of breast: Secondary | ICD-10-CM | POA: Diagnosis not present

## 2018-11-04 DIAGNOSIS — D0512 Intraductal carcinoma in situ of left breast: Secondary | ICD-10-CM

## 2018-11-04 DIAGNOSIS — N6320 Unspecified lump in the left breast, unspecified quadrant: Secondary | ICD-10-CM | POA: Diagnosis not present

## 2018-11-10 ENCOUNTER — Other Ambulatory Visit: Payer: Self-pay | Admitting: *Deleted

## 2018-11-10 MED ORDER — ATENOLOL 25 MG PO TABS: 25.0000 mg | ORAL_TABLET | Freq: Every day | ORAL | 0 refills | Status: DC

## 2018-11-10 NOTE — Telephone Encounter (Signed)
Shelia Marks, Caregiver called and stated that they are waiting for a mail order for her medication, Atenolol and it has not came yet. Stated that it has shipped but patient is about to run out and needs a 7 day supply sent to local pharmacy. Faxed.

## 2018-11-13 ENCOUNTER — Telehealth: Payer: Self-pay | Admitting: Oncology

## 2018-11-13 ENCOUNTER — Inpatient Hospital Stay: Payer: Medicare Other | Attending: Oncology

## 2018-11-13 ENCOUNTER — Inpatient Hospital Stay (HOSPITAL_BASED_OUTPATIENT_CLINIC_OR_DEPARTMENT_OTHER): Payer: Medicare Other | Admitting: Oncology

## 2018-11-13 DIAGNOSIS — Z170 Estrogen receptor positive status [ER+]: Secondary | ICD-10-CM | POA: Diagnosis not present

## 2018-11-13 DIAGNOSIS — M810 Age-related osteoporosis without current pathological fracture: Secondary | ICD-10-CM

## 2018-11-13 DIAGNOSIS — D0512 Intraductal carcinoma in situ of left breast: Secondary | ICD-10-CM

## 2018-11-13 DIAGNOSIS — Z79899 Other long term (current) drug therapy: Secondary | ICD-10-CM | POA: Insufficient documentation

## 2018-11-13 DIAGNOSIS — C50812 Malignant neoplasm of overlapping sites of left female breast: Secondary | ICD-10-CM

## 2018-11-13 LAB — COMPREHENSIVE METABOLIC PANEL
ALT: 23 U/L (ref 0–44)
AST: 29 U/L (ref 15–41)
Albumin: 3.9 g/dL (ref 3.5–5.0)
Alkaline Phosphatase: 72 U/L (ref 38–126)
Anion gap: 10 (ref 5–15)
BUN: 18 mg/dL (ref 8–23)
CO2: 28 mmol/L (ref 22–32)
Calcium: 10.2 mg/dL (ref 8.9–10.3)
Chloride: 108 mmol/L (ref 98–111)
Creatinine, Ser: 1.02 mg/dL — ABNORMAL HIGH (ref 0.44–1.00)
GFR calc Af Amer: 55 mL/min — ABNORMAL LOW (ref >60)
GFR calc non Af Amer: 47 mL/min — ABNORMAL LOW (ref >60)
Glucose, Bld: 84 mg/dL (ref 70–99)
Potassium: 3.9 mmol/L (ref 3.5–5.1)
Sodium: 146 mmol/L — ABNORMAL HIGH (ref 135–145)
Total Bilirubin: 0.8 mg/dL (ref 0.3–1.2)
Total Protein: 7.6 g/dL (ref 6.5–8.1)

## 2018-11-13 LAB — CBC WITH DIFFERENTIAL/PLATELET
Abs Immature Granulocytes: 0.02 10*3/uL (ref 0.00–0.07)
Basophils Absolute: 0 10*3/uL (ref 0.0–0.1)
Basophils Relative: 1 %
Eosinophils Absolute: 0.1 10*3/uL (ref 0.0–0.5)
Eosinophils Relative: 2 %
HCT: 36.7 % (ref 36.0–46.0)
Hemoglobin: 11.4 g/dL — ABNORMAL LOW (ref 12.0–15.0)
Immature Granulocytes: 1 %
Lymphocytes Relative: 31 %
Lymphs Abs: 1.3 10*3/uL (ref 0.7–4.0)
MCH: 29.4 pg (ref 26.0–34.0)
MCHC: 31.1 g/dL (ref 30.0–36.0)
MCV: 94.6 fL (ref 80.0–100.0)
Monocytes Absolute: 0.4 10*3/uL (ref 0.1–1.0)
Monocytes Relative: 8 %
Neutro Abs: 2.5 10*3/uL (ref 1.7–7.7)
Neutrophils Relative %: 57 %
Platelets: 178 10*3/uL (ref 150–400)
RBC: 3.88 MIL/uL (ref 3.87–5.11)
RDW: 15.7 % — ABNORMAL HIGH (ref 11.5–15.5)
WBC: 4.3 10*3/uL (ref 4.0–10.5)
nRBC: 0 % (ref 0.0–0.2)

## 2018-11-13 NOTE — Telephone Encounter (Signed)
Printed calendar and avs. °

## 2018-11-13 NOTE — Progress Notes (Signed)
[place]  [phone] [phone]     ID: Shelia Marks DOB: April 02, 1926  MR#: [id number]  XVQ#:[id number]  Patient Care Team: Gayland Curry, DO as PCP - General (Geriatric Medicine) Rutherford Guys, MD as Consulting Physician (Ophthalmology) Magrinat, Virgie Dad, MD as Consulting Physician (Oncology) Jovita Kussmaul, MD as Consulting Physician (General Surgery) Jacelyn Pi, MD as Consulting Physician (Endocrinology) OTHER MD:  CHIEF COMPLAINT: Estrogen receptor positive breast cancer  CURRENT TREATMENT: Observation   BREAST CANCER HISTORY: From the original intake note:  "Shelia Marks" had bilateral screening mammography at the [facility] 06/07/2016 showing calcifications in the left breast. She was recalled  05/15/2016 for left diagnostic mammography. This showed the breast density to be category be. In the upper outer left breast there was an area of suspicious calcifications measuring 1.7 cm.  Biopsy of this area was obtained 05/21/2016, and showed ([id number]) ductal carcinoma in situ, high-grade, estrogen receptor 100% positive, progesterone receptor 5% positive, WITH strong staining intensity.  Her subsequent history is as detailed below.  INTERVAL HISTORY: Shelia Marks returns today for follow-up of her estrogen positive breast cancer. She is accompanied by her sister.  The patient continues on letrozole. She endorses hot flashes occasionally.   Since her last visit here, she underwent Left Diagnostic Mammogram with CAD and TOMO and Left Breast Ultrasound, breast density category B, showing progressive malignancy involving the UPPER OUTER QUADRANT of the LEFT breast, with at least 2 new satellite lesions as detailed Above. Solitary pathologic LEFT axillary lymph node as noted previously. No new pathologic lymphadenopathy.    REVIEW OF SYSTEMS: Shelia Marks is doing well for the most part, however, she endorses pain in her left knee. She  has had shots and taken medication for her pain, but very little has helped. She [place] unable to walk much and uses a walker to help her ambulate. She endorses constipation and she feels it is related to the medication that she takes. She gets her medications delivered to her. She has all of her medications written down on a paper explaining what to take and when.  The patient denies unusual headaches, visual changes, nausea, vomiting, or dizziness. There has been no unusual cough, phlegm production, or pleurisy. This been no change in bladder habits. The patient denies unexplained fatigue or unexplained weight loss, bleeding, rash, or fever. A detailed review of systems was otherwise noncontributory.    PAST MEDICAL HISTORY: Past Medical History:  Diagnosis Date  . Alzheimer's disease ([place])   . Cancer ([place])   . Disorder of bone and cartilage, unspecified   . Dizziness and giddiness   . Hyperlipidemia LDL goal < 100   . Hypertension   . Hypothyroidism   . Malignant neoplasm of thyroid gland ([place])   . Nontoxic uninodular goiter   . Other malaise and fatigue   . Other symptoms involving cardiovascular system   . Phlebitis and thrombophlebitis of unspecified site   . Senile osteoporosis   . Sleep related leg cramps   . Stroke ([place])   . Unspecified disorder of lipoid metabolism   . Unspecified hereditary and idiopathic peripheral neuropathy   . Varicose veins of lower extremities with inflammation     PAST SURGICAL HISTORY: Past Surgical History:  Procedure Laterality Date  . ABDOMINAL HYSTERECTOMY     DR HUGES  . BREAST BIOPSY Left 05/21/2016   malignant  . CATRACT SURGERY  2008  . COLON SURGERY    . KNEE SURGERY  right  . PARATHYROID ADENOMA REMOVED  1983  . THYROID SURGERY  July 20, 2011    FAMILY HISTORY Family History  Problem Relation Age of Onset  . Diabetes Brother   The patient's father died from liver problems in his 45s. The patient's mother died in her 44s from  "natural causes". The patient had no brothers. She had 6 sisters, 2 of whom died from complications of diabetes and one from a ruptured aneurysm. There is no history of breast or ovarian cancer in the family.   GYNECOLOGIC HISTORY:  No LMP recorded. Patient has had a hysterectomy.  Shelia Marks does not remember how she was when she had her first period. She never carried a child to term. He is status post hysterectomy and at least one ovary was removed. She never took hormone replacement.   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     ADVANCED DIRECTIVES:  [name] [name] DAVIS IS HER HEALTHCARE PART OF ATTORNEY. [name] CAN BE REACHED AT [phone].   HEALTH MAINTENANCE: Social History   Tobacco Use  . Smoking status: Never Smoker  . Smokeless tobacco: Never Used  Substance Use Topics  . Alcohol use: No  . Drug use: No     No Known Allergies  Current Outpatient Medications  Medication Sig Dispense Refill  . alendronate (FOSAMAX) 70 MG tablet TAKE 1 TABLET (70 MG TOTAL) BY MOUTH EVERY 7 (SEVEN) DAYS. TAKE WITH A FULL GLASS OF WATER ON AN EMPTY STOMACH. 12 tablet 11  . allopurinol (ZYLOPRIM) 300 MG tablet Take 1 tablet (300 mg total) by mouth daily. 90 tablet 1  . amLODipine (NORVASC) 10 MG tablet TAKE 1 TABLET EVERY DAY 90 tablet 3  . aspirin EC 81 MG tablet Take 1 tablet (81 mg total) by mouth daily. 30 tablet 3  . atenolol (TENORMIN) 25 MG tablet Take 1 tablet (25 mg total) by mouth daily. 7 tablet 0  . atorvastatin (LIPITOR) 40 MG tablet Take 1 tablet (40 mg total) by mouth daily. 90 tablet 1  . calcium carbonate (TUMS - DOSED IN MG ELEMENTAL CALCIUM) 500 MG chewable tablet Chew 1 tablet by mouth as needed for indigestion or heartburn.    . Cholecalciferol (VITAMIN D) 2000 units CAPS Take 1 capsule (2,000 Units total) by mouth daily. 30 capsule 2  . iron polysaccharides (NIFEREX)  150 MG capsule Take 1 capsule (150 mg total) by mouth every other day. 15 capsule 3  . letrozole (FEMARA) 2.5 MG tablet TAKE 1 TABLET BY MOUTH EVERY DAY 90 tablet 0  . levothyroxine (SYNTHROID, LEVOTHROID) 112 MCG tablet Take 112 mcg by mouth daily before breakfast. RX'ed Dr.Balan    . lisinopril (PRINIVIL,ZESTRIL) 2.5 MG tablet TAKE ONE TABLET BY MOUTH ONCE DAILY TO CONTROL BLOOD PRESSURE 90 tablet 3  . polyethylene glycol powder (GLYCOLAX/MIRALAX) powder 17 grams in 8oz of water or juice now and if no Bowel Movement by bedtime, repeat Miralax dose. If this is unsuccessful call office. 119 g 0  . sertraline (ZOLOFT) 50 MG tablet Take one tablet by mouth once daily for depression 90 tablet 1   No current facility-administered medications for this visit.     OBJECTIVE: Older African-American woman using a walker Vitals:   11/13/18 1320  BP: (!) 175/71  Pulse: 63  Resp: 18  Temp: 98.3 F (36.8 C)  SpO2: 100%  Body mass index is 30.69 kg/m.    ECOG FS:2 - Symptomatic, <50% confined to bed  Sclerae unicteric, bilateral arcus senilis No cervical or supraclavicular adenopathy Lungs no rales or rhonchi Heart regular rate and rhythm Abd soft, nontender, positive bowel sounds MSK no focal spinal tenderness, no upper extremity lymphedema Neuro: nonfocal Breasts: I do not palpate a well-defined mass in either breast.  Both axillae are benign     LAB RESULTS:  CMP     Component Value Date/Time   NA 146 (H) 11/13/2018 1244   NA 143 06/01/2016 0958   K 3.9 11/13/2018 1244   CL 108 11/13/2018 1244   CO2 28 11/13/2018 1244   GLUCOSE 84 11/13/2018 1244   BUN 18 11/13/2018 1244   BUN 21 06/01/2016 0958   CREATININE 1.02 (H) 11/13/2018 1244   CREATININE 0.99 (H) 02/25/2018 0937   CALCIUM 10.2 11/13/2018 1244   PROT 7.6 11/13/2018 1244   PROT 6.8 11/09/2015 1013   ALBUMIN 3.9 11/13/2018 1244   ALBUMIN 4.1 11/09/2015 1013   AST 29 11/13/2018 1244   ALT 23 11/13/2018 1244    ALKPHOS 72 11/13/2018 1244   BILITOT 0.8 11/13/2018 1244   BILITOT 0.5 11/09/2015 1013   GFRNONAA 47 (L) 11/13/2018 1244   GFRNONAA 51 (L) 02/25/2018 0937   GFRAA 55 (L) 11/13/2018 1244   GFRAA 59 (L) 02/25/2018 0937    INo results found for: SPEP, UPEP  Lab Results  Component Value Date   WBC 4.3 11/13/2018   NEUTROABS 2.5 11/13/2018   HGB 11.4 (L) 11/13/2018   HCT 36.7 11/13/2018   MCV 94.6 11/13/2018   PLT 178 11/13/2018      Chemistry      Component Value Date/Time   NA 146 (H) 11/13/2018 1244   NA 143 06/01/2016 0958   K 3.9 11/13/2018 1244   CL 108 11/13/2018 1244   CO2 28 11/13/2018 1244   BUN 18 11/13/2018 1244   BUN 21 06/01/2016 0958   CREATININE 1.02 (H) 11/13/2018 1244   CREATININE 0.99 (H) 02/25/2018 0937      Component Value Date/Time   CALCIUM 10.2 11/13/2018 1244   ALKPHOS 72 11/13/2018 1244   AST 29 11/13/2018 1244   ALT 23 11/13/2018 1244   BILITOT 0.8 11/13/2018 1244   BILITOT 0.5 11/09/2015 1013       No results found for: LABCA2  No components found for: [id number]  No results for input(s): INR in the last 168 hours.  Urinalysis    Component Value Date/Time   COLORURINE STRAW (A) 03/09/2016 2001   APPEARANCEUR CLEAR 03/09/2016 2001   LABSPEC 1.006 03/09/2016 2001   PHURINE 7.5 03/09/2016 2001   GLUCOSEU NEGATIVE 03/09/2016 2001   HGBUR NEGATIVE 03/09/2016 2001   [place] NEGATIVE 03/09/2016 2001   [place] 03/09/2016 2001   PROTEINUR NEGATIVE 03/09/2016 2001   UROBILINOGEN 0.2 03/05/2014 2055   NITRITE NEGATIVE 03/09/2016 2001   LEUKOCYTESUR NEGATIVE 03/09/2016 2001    STUDIES: US Breast Ltd Uni Left Inc Axilla  Result Date: 11/04/2018 CLINICAL DATA:  84 year old with biopsy-proven DCIS involving the UPPER OUTER QUADRANT of the LEFT breast in May, 2017. The patient was briefly on anastrozole therapy, but this was discontinued due to significant arthralgias and myalgias. She is currently undergoing no treatment and  is under observation. EXAM: DIGITAL DIAGNOSTIC LEFT MAMMOGRAM WITH CAD AND TOMO ULTRASOUND LEFT BREAST COMPARISON:  Mammography 06/10/2018 (BILATERAL), 11/11/2017 (LEFT), 06/07/2017 (BILATERAL) and earlier. LEFT breast ultrasound 06/10/2018. ACR Breast  Density Category b: There are scattered areas of fibroglandular density. FINDINGS: Standard 2D and tomosynthesis full field CC and MLO views of the LEFT breast were obtained. The segmental calcifications involving the UPPER OUTER QUADRANT of the LEFT breast have not significantly changed since the examination 06/10/2018, currently spanning approximately 7.7 x 4.4 x 7.9 cm. The mass with associated architectural distortion in the UPPER OUTER QUADRANT, centrally located within the group of calcifications, is unchanged mammographically. There may be a new mass in the [place] at POSTERIOR depth. Mammographic images were processed with CAD. Targeted LEFT breast ultrasound is performed, showing the previously identified hypoechoic mass with indistinct irregular margins at the 1 o'clock position approximately 5 cm from the nipple measuring approximately 1.4 x 2.5 x 2.7 cm (previously 1.4 x 2.1 x 2.6 cm 06/10/2018). There are at least two new hypoechoic masses in the UPPER OUTER QUADRANT, including a hypoechoic mass with irregular margins at the 3 o'clock position approximately 3 cm from the nipple measuring approximately 0.6 x 0.4 x 0.8 cm and a hypoechoic mass with irregular margins containing calcifications at the 2 o'clock position approximately 8 cm from the nipple measuring approximately 0.5 x 0.7 x 0.7 cm; this mass corresponds to the mammographic finding. Sonographic evaluation of the LEFT axilla and again demonstrates the solitary lymph node with focal cortical thickening up to 6 mm, with apparent calcification in the area of cortical thickening. No new pathologic lymphadenopathy is identified. IMPRESSION: 1. Progressive malignancy involving the UPPER  OUTER QUADRANT of the LEFT breast, with at least 2 new satellite lesions as detailed above. 2. Solitary pathologic LEFT axillary lymph node as noted previously. No new pathologic lymphadenopathy. RECOMMENDATION: 1. Treatment plan. 2. The patient will be due for her annual BILATERAL diagnostic mammogram in June, 2020. I have discussed the findings and recommendations with the patient and her daughter. Results were also provided in writing at the conclusion of the visit. If applicable, a reminder letter will be sent to the patient regarding the next appointment. BI-RADS CATEGORY  6: Known biopsy-proven malignancy. Electronically Signed   By: Evangeline Dakin M.D.   On: 11/04/2018 11:49   Mm Diag Breast Tomo Uni Left  Result Date: 11/04/2018 CLINICAL DATA:  84 year old with biopsy-proven DCIS involving the UPPER OUTER QUADRANT of the LEFT breast in May, 2017. The patient was briefly on anastrozole therapy, but this was discontinued due to significant arthralgias and myalgias. She is currently undergoing no treatment and is under observation. EXAM: DIGITAL DIAGNOSTIC LEFT MAMMOGRAM WITH CAD AND TOMO ULTRASOUND LEFT BREAST COMPARISON:  Mammography 06/10/2018 (BILATERAL), 11/11/2017 (LEFT), 06/07/2017 (BILATERAL) and earlier. LEFT breast ultrasound 06/10/2018. ACR Breast Density Category b: There are scattered areas of fibroglandular density. FINDINGS: Standard 2D and tomosynthesis full field CC and MLO views of the LEFT breast were obtained. The segmental calcifications involving the UPPER OUTER QUADRANT of the LEFT breast have not significantly changed since the examination 06/10/2018, currently spanning approximately 7.7 x 4.4 x 7.9 cm. The mass with associated architectural distortion in the UPPER OUTER QUADRANT, centrally located within the group of calcifications, is unchanged mammographically. There may be a new mass in the [place] at POSTERIOR depth. Mammographic images were processed with CAD.  Targeted LEFT breast ultrasound is performed, showing the previously identified hypoechoic mass with indistinct irregular margins at the 1 o'clock position approximately 5 cm from the nipple measuring approximately 1.4 x 2.5 x 2.7 cm (previously 1.4 x 2.1 x 2.6 cm 06/10/2018). There are at least  two new hypoechoic masses in the UPPER OUTER QUADRANT, including a hypoechoic mass with irregular margins at the 3 o'clock position approximately 3 cm from the nipple measuring approximately 0.6 x 0.4 x 0.8 cm and a hypoechoic mass with irregular margins containing calcifications at the 2 o'clock position approximately 8 cm from the nipple measuring approximately 0.5 x 0.7 x 0.7 cm; this mass corresponds to the mammographic finding. Sonographic evaluation of the LEFT axilla and again demonstrates the solitary lymph node with focal cortical thickening up to 6 mm, with apparent calcification in the area of cortical thickening. No new pathologic lymphadenopathy is identified. IMPRESSION: 1. Progressive malignancy involving the UPPER OUTER QUADRANT of the LEFT breast, with at least 2 new satellite lesions as detailed above. 2. Solitary pathologic LEFT axillary lymph node as noted previously. No new pathologic lymphadenopathy. RECOMMENDATION: 1. Treatment plan. 2. The patient will be due for her annual BILATERAL diagnostic mammogram in June, 2020. I have discussed the findings and recommendations with the patient and her daughter. Results were also provided in writing at the conclusion of the visit. If applicable, a reminder letter will be sent to the patient regarding the next appointment. BI-RADS CATEGORY  6: Known biopsy-proven malignancy. Electronically Signed   By: Evangeline Dakin M.D.   On: 11/04/2018 11:49     ELIGIBLE FOR AVAILABLE RESEARCH PROTOCOL: no  ASSESSMENT: 84 y.o. [place] woman status post left breast upper outer quadrant biopsy 05/21/2016 for ductal carcinoma in situ, high-grade, estrogen and  progesterone receptor positive  (1) opted against surgery   (2) started anastrozole neoadjuvantly 06/13/2016, discontinued June 2018 due to osteoporosis concerns  (a)  bone density 11/16/2016 shows a T score of -4.7 (osteoporosis)-on fosamax.   (3) mammography/ultrasonography 06/10/2018 shows evidence of disease progression, possible lymph node extension  (4) letrozole started 06/27/2018   (a) discontinued November 2019 with evidence of progression   PLAN: Ms. Gritton breast cancer is now multifocal and very likely invasive.  Normally we would proceed to biopsy of the new spots, surgery, radiation, and more antiestrogens.  However she tells me she is very elderly.  She does not know how long she is going to live.  She does not want anything that is going to cause pain or complicate her life.  We went over the fact that now we are doing nerve blocks for breast cancer surgery and that can really minimize the need for anesthesia.  I think with nerve block and local anesthetics she would be able to tolerate surgery with minimal complications.  As an alternative I offered her to start fulvestrant.  She could have her first dose next week.  We talked about the possible toxicities, side effects and complications of treatment.  Tentatively asked her to stop the aspirin since she might be having surgery.  She is definitely stopping the letrozole  I will see her again in 6 weeks.  By then we should know what she decided--today she would only go as far as saying that she needs to pray about it  Magrinat, Virgie Dad, MD  11/13/18 2:16 PM Medical Oncology and Hematology [facility] [place], [place] [address] Tel. [phone]    Fax. [phone]  I, Margit Banda am acting as a scribe for Chauncey Cruel, MD.   I, Lurline Del MD, have reviewed the above documentation for accuracy and completeness, and I agree with the above.

## 2018-11-14 ENCOUNTER — Telehealth: Payer: Self-pay | Admitting: Oncology

## 2018-11-14 NOTE — Telephone Encounter (Signed)
Called and left a voice message in regards to me making changes to the appointments. Had to change the infusion to injection.

## 2018-11-21 ENCOUNTER — Ambulatory Visit: Payer: Medicare Other

## 2018-11-21 ENCOUNTER — Inpatient Hospital Stay: Payer: Medicare Other

## 2018-11-21 VITALS — BP 198/78 | HR 71 | Temp 98.3°F | Resp 18

## 2018-11-21 DIAGNOSIS — Z170 Estrogen receptor positive status [ER+]: Secondary | ICD-10-CM | POA: Diagnosis not present

## 2018-11-21 DIAGNOSIS — D0512 Intraductal carcinoma in situ of left breast: Secondary | ICD-10-CM | POA: Diagnosis not present

## 2018-11-21 DIAGNOSIS — C50812 Malignant neoplasm of overlapping sites of left female breast: Secondary | ICD-10-CM

## 2018-11-21 DIAGNOSIS — M810 Age-related osteoporosis without current pathological fracture: Secondary | ICD-10-CM | POA: Diagnosis not present

## 2018-11-21 DIAGNOSIS — Z79899 Other long term (current) drug therapy: Secondary | ICD-10-CM | POA: Diagnosis not present

## 2018-11-21 MED ORDER — FULVESTRANT 250 MG/5ML IM SOLN
500.0000 mg | Freq: Once | INTRAMUSCULAR | Status: AC
  Administered 2018-11-21: 500 mg via INTRAMUSCULAR

## 2018-11-21 NOTE — Patient Instructions (Signed)

## 2018-12-17 NOTE — Progress Notes (Signed)
[place]  Telephone:(336) [phone] Fax:(336) [phone]     ID: Shelia Marks DOB: [birth year]  MR#: [id number]  CZY#:[id number]  Patient Care Team: Gayland Curry, DO as PCP - General (Geriatric Medicine) Rutherford Guys, MD as Consulting Physician (Ophthalmology) Anneke Cundy, Virgie Dad, MD as Consulting Physician (Oncology) Jovita Kussmaul, MD as Consulting Physician (General Surgery) Jacelyn Pi, MD as Consulting Physician (Endocrinology) OTHER MD:  CHIEF COMPLAINT: Estrogen receptor positive breast cancer  CURRENT TREATMENT: Fulvestrant   BREAST CANCER HISTORY: From the original intake note:  "Shelia Marks" had bilateral screening mammography at the [facility] 06/07/2016 showing calcifications in the left breast. She was recalled  05/15/2016 for left diagnostic mammography. This showed the breast density to be category be. In the upper outer left breast there was an area of suspicious calcifications measuring 1.7 cm.  Biopsy of this area was obtained 05/21/2016, and showed ([id number]) ductal carcinoma in situ, high-grade, estrogen receptor 100% positive, progesterone receptor 5% positive, WITH strong staining intensity.  Her subsequent history is as detailed below.  INTERVAL HISTORY: Shelia Marks returns today for follow-up of her estrogen receptor positive breast cancer. She is accompanied by a friend from [place], [place], and her sister, Shelia Marks.  She received her first dose of fulvestrant 11/21/2018, which she tolerated well. She states that it stung.  He has had no other side effects and the discomfort of the injection itself  Results as of 12/18/2018 11:17  Ref. Range 08/25/2018 12:49  CA 27.29 Latest Ref Range: 0.0 - 38.6 U/mL 36.1    REVIEW OF SYSTEMS: Shelia Marks has been well overall. For christmas, she will be taking it easy. She believes that her breast has grown slightly. The patient denies unusual headaches, visual changes, nausea, vomiting, or  dizziness. There has been no unusual cough, phlegm production, or pleurisy. This been no change in bowel or bladder habits. The patient denies unexplained fatigue or unexplained weight loss, bleeding, rash, or fever. A detailed review of systems was otherwise noncontributory.    PAST MEDICAL HISTORY: Past Medical History:  Diagnosis Date  . Alzheimer's disease ([place])   . Cancer ([place])   . Disorder of bone and cartilage, unspecified   . Dizziness and giddiness   . Hyperlipidemia LDL goal < 100   . Hypertension   . Hypothyroidism   . Malignant neoplasm of thyroid gland ([place])   . Nontoxic uninodular goiter   . Other malaise and fatigue   . Other symptoms involving cardiovascular system   . Phlebitis and thrombophlebitis of unspecified site   . Senile osteoporosis   . Sleep related leg cramps   . Stroke ([place])   . Unspecified disorder of lipoid metabolism   . Unspecified hereditary and idiopathic peripheral neuropathy   . Varicose veins of lower extremities with inflammation     PAST SURGICAL HISTORY: Past Surgical History:  Procedure Laterality Date  . ABDOMINAL HYSTERECTOMY     DR HUGES  . BREAST BIOPSY Left 05/21/2016   malignant  . CATRACT SURGERY  2008  . COLON SURGERY    . KNEE SURGERY     right  . PARATHYROID ADENOMA REMOVED  1983  . THYROID SURGERY  July 20, 2011    FAMILY HISTORY Family History  Problem Relation Age of Onset  . Diabetes Brother   The patient's father died from liver problems in his 23s. The patient's mother died in her 28s from "natural causes". The patient had no brothers. She had 6 sisters, 2 of whom died from  complications of diabetes and one from a ruptured aneurysm. There is no history of breast or ovarian cancer in the family.   GYNECOLOGIC HISTORY:  No LMP recorded. Patient has had a hysterectomy.  Shelia Marks does not remember how she was when she had her first period. She never carried a child to term. He is status post hysterectomy and at  least one ovary was removed. She never took hormone replacement.   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     ADVANCED DIRECTIVES:  [name] Shelia Marks IS HER HEALTHCARE PART OF ATTORNEY. Shelia CAN BE REACHED AT [phone].   HEALTH MAINTENANCE: Social History   Tobacco Use  . Smoking status: Never Smoker  . Smokeless tobacco: Never Used  Substance Use Topics  . Alcohol use: No  . Drug use: No     No Known Allergies  Current Outpatient Medications  Medication Sig Dispense Refill  . alendronate (FOSAMAX) 70 MG tablet TAKE 1 TABLET (70 MG TOTAL) BY MOUTH EVERY 7 (SEVEN) DAYS. TAKE WITH A FULL GLASS OF WATER ON AN EMPTY STOMACH. 12 tablet 11  . allopurinol (ZYLOPRIM) 300 MG tablet Take 1 tablet (300 mg total) by mouth daily. 90 tablet 1  . amLODipine (NORVASC) 10 MG tablet TAKE 1 TABLET EVERY DAY 90 tablet 3  . aspirin EC 81 MG tablet Take 1 tablet (81 mg total) by mouth daily. 30 tablet 3  . atenolol (TENORMIN) 25 MG tablet Take 1 tablet (25 mg total) by mouth daily. 7 tablet 0  . atorvastatin (LIPITOR) 40 MG tablet Take 1 tablet (40 mg total) by mouth daily. 90 tablet 1  . calcium carbonate (TUMS - DOSED IN MG ELEMENTAL CALCIUM) 500 MG chewable tablet Chew 1 tablet by mouth as needed for indigestion or heartburn.    . Cholecalciferol (VITAMIN D) 2000 units CAPS Take 1 capsule (2,000 Units total) by mouth daily. 30 capsule 2  . iron polysaccharides (NIFEREX) 150 MG capsule Take 1 capsule (150 mg total) by mouth every other day. 15 capsule 3  . letrozole (FEMARA) 2.5 MG tablet TAKE 1 TABLET BY MOUTH EVERY DAY 90 tablet 0  . levothyroxine (SYNTHROID, LEVOTHROID) 112 MCG tablet Take 112 mcg by mouth daily before breakfast. RX'ed Dr.Balan    . lisinopril (PRINIVIL,ZESTRIL) 2.5 MG tablet TAKE ONE TABLET BY MOUTH ONCE DAILY TO CONTROL BLOOD PRESSURE 90 tablet 3  . polyethylene glycol  powder (GLYCOLAX/MIRALAX) powder 17 grams in 8oz of water or juice now and if no Bowel Movement by bedtime, repeat Miralax dose. If this is unsuccessful call office. 119 g 0  . sertraline (ZOLOFT) 50 MG tablet Take one tablet by mouth once daily for depression 90 tablet 1   No current facility-administered medications for this visit.    Facility-Administered Medications Ordered in Other Visits  Medication Dose Route Frequency Provider Last Rate Last Dose  . fulvestrant (FASLODEX) injection 500 mg  500 mg Intramuscular Once Kennadi Albany, Virgie Dad, MD        OBJECTIVE: Older African-American woman who appears stated age 40:   12/18/18 1110  BP: (!) 174/85  Pulse: 71  Resp: 18  Temp: 98.5 F (36.9 C)  SpO2: 97%     Body mass index is 30.97 kg/m.    ECOG FS:2 - Symptomatic, <50% confined to bed  Sclerae unicteric, EOMs intact No cervical or supraclavicular adenopathy Lungs no rales  or rhonchi Heart regular rate and rhythm Abd soft, nontender, positive bowel sounds MSK no focal spinal tenderness, no upper extremity lymphedema Neuro: nonfocal, well oriented, appropriate affect Breasts: The right breast is benign.  The mass in the left breast, upper outer quadrant, measures approximately 1-1/2 cm, is movable, does not affect the skin or nipple.  Both axillae are benign.  LAB RESULTS:  CMP     Component Value Date/Time   NA 146 (H) 11/13/2018 1244   NA 143 06/01/2016 0958   K 3.9 11/13/2018 1244   CL 108 11/13/2018 1244   CO2 28 11/13/2018 1244   GLUCOSE 84 11/13/2018 1244   BUN 18 11/13/2018 1244   BUN 21 06/01/2016 0958   CREATININE 1.02 (H) 11/13/2018 1244   CREATININE 0.99 (H) 02/25/2018 0937   CALCIUM 10.2 11/13/2018 1244   PROT 7.6 11/13/2018 1244   PROT 6.8 11/09/2015 1013   ALBUMIN 3.9 11/13/2018 1244   ALBUMIN 4.1 11/09/2015 1013   AST 29 11/13/2018 1244   ALT 23 11/13/2018 1244   ALKPHOS 72 11/13/2018 1244   BILITOT 0.8 11/13/2018 1244   BILITOT 0.5 11/09/2015  1013   GFRNONAA 47 (L) 11/13/2018 1244   GFRNONAA 51 (L) 02/25/2018 0937   GFRAA 55 (L) 11/13/2018 1244   GFRAA 59 (L) 02/25/2018 0937    INo results found for: SPEP, UPEP  Lab Results  Component Value Date   WBC 4.3 11/13/2018   NEUTROABS 2.5 11/13/2018   HGB 11.4 (L) 11/13/2018   HCT 36.7 11/13/2018   MCV 94.6 11/13/2018   PLT 178 11/13/2018      Chemistry      Component Value Date/Time   NA 146 (H) 11/13/2018 1244   NA 143 06/01/2016 0958   K 3.9 11/13/2018 1244   CL 108 11/13/2018 1244   CO2 28 11/13/2018 1244   BUN 18 11/13/2018 1244   BUN 21 06/01/2016 0958   CREATININE 1.02 (H) 11/13/2018 1244   CREATININE 0.99 (H) 02/25/2018 0937      Component Value Date/Time   CALCIUM 10.2 11/13/2018 1244   ALKPHOS 72 11/13/2018 1244   AST 29 11/13/2018 1244   ALT 23 11/13/2018 1244   BILITOT 0.8 11/13/2018 1244   BILITOT 0.5 11/09/2015 1013       No results found for: LABCA2  No components found for: LABCA125  No results for input(s): INR in the last 168 hours.  Urinalysis    Component Value Date/Time   COLORURINE STRAW (A) 03/09/2016 2001   APPEARANCEUR CLEAR 03/09/2016 2001   LABSPEC 1.006 03/09/2016 2001   PHURINE 7.5 03/09/2016 2001   GLUCOSEU NEGATIVE 03/09/2016 2001   HGBUR NEGATIVE 03/09/2016 2001   [place] NEGATIVE 03/09/2016 2001   [place] 03/09/2016 2001   PROTEINUR NEGATIVE 03/09/2016 2001   UROBILINOGEN 0.2 03/05/2014 2055   NITRITE NEGATIVE 03/09/2016 2001   LEUKOCYTESUR NEGATIVE 03/09/2016 2001    STUDIES: No results found.   ELIGIBLE FOR AVAILABLE RESEARCH PROTOCOL: no  ASSESSMENT: 84 y.o. [place] woman status post left breast upper outer quadrant biopsy 05/21/2016 for ductal carcinoma in situ, high-grade, estrogen and progesterone receptor positive  (1) opted against surgery   (2) started anastrozole neoadjuvantly 06/13/2016, discontinued June 2018 due to osteoporosis concerns  (a)  bone density 11/16/2016 shows  a T score of -4.7 (osteoporosis)-on fosamax.   (3) mammography/ultrasonography 06/10/2018 shows evidence of disease progression, possible lymph node extension  (4) letrozole started 06/27/2018   (a) discontinued November 2019 with evidence of  progression  (5) fulvestrant started 11/21/2018   PLAN: Shelia Marks' tolerated her first Faslodex injection without event and she will receive the second 1 today.  She will return in January for her third 1 and then she will see me in February for her fourth dose.  By that time we should be able to tell if this is going to be working.  After 6 doses she will have a restaging ultrasound which will be more accurate of course and physical exam  Today we again discussed surgery.  The plan at this point is to postpone it so long as we can control it with fulvestrant  I have written her a prescription for bras as the one she has appears uncomfortable.  She will let me know if she has any difficulty with that  Otherwise I will call with any other issues that may develop before the next visit.   Taleigha Pinson, Virgie Dad, MD  12/18/18 11:42 AM Medical Oncology and Hematology [facility] [address] [place], [place] [address] Tel. [phone]    Fax. [phone]   I, Jacqualyn Posey am acting as a Education administrator for Chauncey Cruel, MD.   I, Lurline Del MD, have reviewed the above documentation for accuracy and completeness, and I agree with the above.

## 2018-12-18 ENCOUNTER — Ambulatory Visit: Payer: Medicare Other

## 2018-12-18 ENCOUNTER — Telehealth: Payer: Self-pay | Admitting: Oncology

## 2018-12-18 ENCOUNTER — Inpatient Hospital Stay: Payer: Medicare Other | Attending: Oncology | Admitting: Oncology

## 2018-12-18 ENCOUNTER — Inpatient Hospital Stay: Payer: Medicare Other

## 2018-12-18 VITALS — BP 174/85 | HR 71 | Temp 98.5°F | Resp 18 | Ht 61.0 in | Wt 163.9 lb

## 2018-12-18 DIAGNOSIS — C50812 Malignant neoplasm of overlapping sites of left female breast: Secondary | ICD-10-CM

## 2018-12-18 DIAGNOSIS — Z170 Estrogen receptor positive status [ER+]: Secondary | ICD-10-CM | POA: Diagnosis not present

## 2018-12-18 DIAGNOSIS — D0512 Intraductal carcinoma in situ of left breast: Secondary | ICD-10-CM

## 2018-12-18 DIAGNOSIS — M810 Age-related osteoporosis without current pathological fracture: Secondary | ICD-10-CM | POA: Diagnosis not present

## 2018-12-18 DIAGNOSIS — Z79899 Other long term (current) drug therapy: Secondary | ICD-10-CM | POA: Diagnosis not present

## 2018-12-18 MED ORDER — FULVESTRANT 250 MG/5ML IM SOLN
INTRAMUSCULAR | Status: AC
  Filled 2018-12-18: qty 5

## 2018-12-18 MED ORDER — FULVESTRANT 250 MG/5ML IM SOLN
500.0000 mg | Freq: Once | INTRAMUSCULAR | Status: AC
  Administered 2018-12-18: 500 mg via INTRAMUSCULAR

## 2018-12-18 NOTE — Patient Instructions (Signed)

## 2018-12-18 NOTE — Telephone Encounter (Signed)
Gave patient avs and calendar.   °

## 2018-12-19 ENCOUNTER — Other Ambulatory Visit: Payer: Self-pay | Admitting: Oncology

## 2018-12-22 ENCOUNTER — Other Ambulatory Visit: Payer: Self-pay | Admitting: Internal Medicine

## 2018-12-29 ENCOUNTER — Other Ambulatory Visit: Payer: Self-pay

## 2018-12-29 NOTE — Telephone Encounter (Signed)
Received call from pt sister wanting to verify if pt still needs to take letrozole and pick up her refill at the pharmacy. Per Dr.Magrinat's last note, due to pt progression of disease, letrozole discontinued and start on faslodex. Will discontinue letrozole on patient med list and sister will notify pharmacy of change. No further needs at this time.

## 2019-01-15 ENCOUNTER — Inpatient Hospital Stay: Payer: Medicare Other | Attending: Oncology

## 2019-01-15 ENCOUNTER — Other Ambulatory Visit: Payer: Self-pay | Admitting: Adult Health

## 2019-01-15 VITALS — BP 179/74 | HR 66 | Temp 98.2°F | Resp 18

## 2019-01-15 DIAGNOSIS — C50812 Malignant neoplasm of overlapping sites of left female breast: Secondary | ICD-10-CM

## 2019-01-15 DIAGNOSIS — D0512 Intraductal carcinoma in situ of left breast: Secondary | ICD-10-CM | POA: Insufficient documentation

## 2019-01-15 DIAGNOSIS — M810 Age-related osteoporosis without current pathological fracture: Secondary | ICD-10-CM | POA: Insufficient documentation

## 2019-01-15 DIAGNOSIS — Z79899 Other long term (current) drug therapy: Secondary | ICD-10-CM | POA: Diagnosis not present

## 2019-01-15 DIAGNOSIS — Z170 Estrogen receptor positive status [ER+]: Secondary | ICD-10-CM | POA: Diagnosis not present

## 2019-01-15 MED ORDER — FULVESTRANT 250 MG/5ML IM SOLN
INTRAMUSCULAR | Status: AC
  Filled 2019-01-15: qty 5

## 2019-01-15 MED ORDER — FULVESTRANT 250 MG/5ML IM SOLN
500.0000 mg | Freq: Once | INTRAMUSCULAR | Status: AC
  Administered 2019-01-15: 500 mg via INTRAMUSCULAR

## 2019-01-15 NOTE — Patient Instructions (Signed)

## 2019-02-11 NOTE — Progress Notes (Signed)
[place]  Telephone:(336) [phone] Fax:(336) [phone]    ID: Shelia Marks DOB: 03-07-1926  MR#: [id number]  IRJ#:[id number]  Patient Care Team: Gayland Curry, DO as PCP - General (Geriatric Medicine) Rutherford Guys, MD as Consulting Physician (Ophthalmology) Magrinat, Virgie Dad, MD as Consulting Physician (Oncology) Jovita Kussmaul, MD as Consulting Physician (General Surgery) Jacelyn Pi, MD as Consulting Physician (Endocrinology) OTHER MD:   CHIEF COMPLAINT: Estrogen receptor positive breast cancer  CURRENT TREATMENT: Fulvestrant   BREAST CANCER HISTORY: From the original intake note:  "Shelia Marks" had bilateral screening mammography at the [facility] 06/07/2016 showing calcifications in the left breast. She was recalled  05/15/2016 for left diagnostic mammography. This showed the breast density to be category be. In the upper outer left breast there was an area of suspicious calcifications measuring 1.7 cm.  Biopsy of this area was obtained 05/21/2016, and showed ([id number]) ductal carcinoma in situ, high-grade, estrogen receptor 100% positive, progesterone receptor 5% positive, WITH strong staining intensity.  Her subsequent history is as detailed below.   INTERVAL HISTORY: Shelia Marks returns today for follow-up and treatment of her estrogen receptor positive breast cancer. She is accompanied by a friend from [place], [place], and her sister, Shelia Marks.  She continues on fulvestrant. Today is her 4th dose. She has some fatigue.    Since her last visit here, she has not undergone any additional studies.    REVIEW OF SYSTEMS: Shelia Marks has not been very active. She has been eating well. She has some myalgia. On days that she feels good, she likes to watch TV.  The patient denies unusual headaches, visual changes, nausea, vomiting, or dizziness. There has been no unusual cough, phlegm production, or pleurisy. This been no change in bowel or bladder habits.  The patient denies unexplained weight loss, bleeding, rash, or fever. A detailed review of systems was otherwise noncontributory.    PAST MEDICAL HISTORY: Past Medical History:  Diagnosis Date  . Alzheimer's disease ([place])   . Cancer ([place])   . Disorder of bone and cartilage, unspecified   . Dizziness and giddiness   . Hyperlipidemia LDL goal < 100   . Hypertension   . Hypothyroidism   . Malignant neoplasm of thyroid gland ([place])   . Nontoxic uninodular goiter   . Other malaise and fatigue   . Other symptoms involving cardiovascular system   . Phlebitis and thrombophlebitis of unspecified site   . Senile osteoporosis   . Sleep related leg cramps   . Stroke ([place])   . Unspecified disorder of lipoid metabolism   . Unspecified hereditary and idiopathic peripheral neuropathy   . Varicose veins of lower extremities with inflammation     PAST SURGICAL HISTORY: Past Surgical History:  Procedure Laterality Date  . ABDOMINAL HYSTERECTOMY     DR HUGES  . BREAST BIOPSY Left 05/21/2016   malignant  . CATRACT SURGERY  2008  . COLON SURGERY    . KNEE SURGERY     right  . PARATHYROID ADENOMA REMOVED  1983  . THYROID SURGERY  July 20, 2011    FAMILY HISTORY Family History  Problem Relation Age of Onset  . Diabetes Brother    The patient's father died from liver problems in his 50s. The patient's mother died in her 9s from "natural causes". The patient had no brothers. She had 6 sisters, 2 of whom died from complications of diabetes and one from a ruptured aneurysm. There is no history of breast or ovarian cancer in  the family.    GYNECOLOGIC HISTORY:  No LMP recorded. Patient has had a hysterectomy.  Shelia Marks does not remember how she was when she had her first period. She never carried a child to term. He is status post hysterectomy and at least one ovary was removed. She never took hormone replacement.    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    ADVANCED DIRECTIVES: [name], Shelia Marks, is her healthcare power of attorney. Shelia Marks can be reached at [phone].   HEALTH MAINTENANCE: Social History   Tobacco Use  . Smoking status: Never Smoker  . Smokeless tobacco: Never Used  Substance Use Topics  . Alcohol use: No  . Drug use: No     No Known Allergies  Current Outpatient Medications  Medication Sig Dispense Refill  . alendronate (FOSAMAX) 70 MG tablet TAKE 1 TABLET (70 MG TOTAL) BY MOUTH EVERY 7 (SEVEN) DAYS. TAKE WITH A FULL GLASS OF WATER ON AN EMPTY STOMACH. 12 tablet 11  . allopurinol (ZYLOPRIM) 300 MG tablet Take 1 tablet (300 mg total) by mouth daily. 90 tablet 1  . amLODipine (NORVASC) 10 MG tablet TAKE 1 TABLET EVERY DAY 90 tablet 1  . aspirin EC 81 MG tablet Take 1 tablet (81 mg total) by mouth daily. 30 tablet 3  . atenolol (TENORMIN) 25 MG tablet Take 1 tablet (25 mg total) by mouth daily. 7 tablet 0  . atorvastatin (LIPITOR) 40 MG tablet Take 1 tablet (40 mg total) by mouth daily. 90 tablet 1  . calcium carbonate (TUMS - DOSED IN MG ELEMENTAL CALCIUM) 500 MG chewable tablet Chew 1 tablet by mouth as needed for indigestion or heartburn.    . Cholecalciferol (VITAMIN D) 2000 units CAPS Take 1 capsule (2,000 Units total) by mouth daily. 30 capsule 2  . iron polysaccharides (NIFEREX) 150 MG capsule Take 1 capsule (150 mg total) by mouth every other day. 15 capsule 3  . levothyroxine (SYNTHROID, LEVOTHROID) 112 MCG tablet Take 112 mcg by mouth daily before breakfast. RX'ed Dr.Balan    . lisinopril (PRINIVIL,ZESTRIL) 2.5 MG tablet TAKE ONE TABLET BY MOUTH ONCE DAILY TO CONTROL BLOOD PRESSURE 90 tablet 1  . polyethylene glycol powder (GLYCOLAX/MIRALAX) powder 17 grams in 8oz of water or juice now and if no Bowel Movement by bedtime, repeat Miralax dose. If this is unsuccessful call office. 119 g 0  . sertraline (ZOLOFT) 50 MG tablet Take one  tablet by mouth once daily for depression 90 tablet 1   No current facility-administered medications for this visit.     OBJECTIVE: Older African-American woman examined in a wheelchair Vitals:   02/12/19 1048  BP: (!) 158/67  Pulse: (!) 55  Resp: 18  Temp: 98.5 F (36.9 C)  SpO2: 100%     Body mass index is 30.48 kg/m.    ECOG FS:2 - Symptomatic, <50% confined to bed  Sclerae unicteric, bilateral arcus senilis No cervical or supraclavicular adenopathy Lungs no rales or rhonchi Heart regular rate and rhythm Abd soft, nontender, positive bowel sounds Neuro: nonfocal,  appropriate affect Breasts: In the upper outer quadrant of the left breast there is is movable nontender mass measuring approximately 1-1/2 cm, unchanged from most recent exam, without erythema, swelling, or tenderness.  The left axilla is benign.  LAB RESULTS:  CMP     Component Value Date/Time   NA 146 (H) 11/13/2018  1244   NA 143 06/01/2016 0958   K 3.9 11/13/2018 1244   CL 108 11/13/2018 1244   CO2 28 11/13/2018 1244   GLUCOSE 84 11/13/2018 1244   BUN 18 11/13/2018 1244   BUN 21 06/01/2016 0958   CREATININE 1.02 (H) 11/13/2018 1244   CREATININE 0.99 (H) 02/25/2018 0937   CALCIUM 10.2 11/13/2018 1244   PROT 7.6 11/13/2018 1244   PROT 6.8 11/09/2015 1013   ALBUMIN 3.9 11/13/2018 1244   ALBUMIN 4.1 11/09/2015 1013   AST 29 11/13/2018 1244   ALT 23 11/13/2018 1244   ALKPHOS 72 11/13/2018 1244   BILITOT 0.8 11/13/2018 1244   BILITOT 0.5 11/09/2015 1013   GFRNONAA 47 (L) 11/13/2018 1244   GFRNONAA 51 (L) 02/25/2018 0937   GFRAA 55 (L) 11/13/2018 1244   GFRAA 59 (L) 02/25/2018 0937    INo results found for: SPEP, UPEP  Lab Results  Component Value Date   WBC 4.2 02/12/2019   NEUTROABS 2.4 02/12/2019   HGB 11.6 (L) 02/12/2019   HCT 38.0 02/12/2019   MCV 96.2 02/12/2019   PLT 184 02/12/2019      Chemistry      Component Value Date/Time   NA 146 (H) 11/13/2018 1244   NA 143 06/01/2016  0958   K 3.9 11/13/2018 1244   CL 108 11/13/2018 1244   CO2 28 11/13/2018 1244   BUN 18 11/13/2018 1244   BUN 21 06/01/2016 0958   CREATININE 1.02 (H) 11/13/2018 1244   CREATININE 0.99 (H) 02/25/2018 0937      Component Value Date/Time   CALCIUM 10.2 11/13/2018 1244   ALKPHOS 72 11/13/2018 1244   AST 29 11/13/2018 1244   ALT 23 11/13/2018 1244   BILITOT 0.8 11/13/2018 1244   BILITOT 0.5 11/09/2015 1013       No results found for: LABCA2  No components found for: LABCA125  No results for input(s): INR in the last 168 hours.  Urinalysis    Component Value Date/Time   COLORURINE STRAW (A) 03/09/2016 2001   APPEARANCEUR CLEAR 03/09/2016 2001   LABSPEC 1.006 03/09/2016 2001   PHURINE 7.5 03/09/2016 2001   GLUCOSEU NEGATIVE 03/09/2016 2001   HGBUR NEGATIVE 03/09/2016 2001   [place] NEGATIVE 03/09/2016 2001   [place] 03/09/2016 2001   PROTEINUR NEGATIVE 03/09/2016 2001   UROBILINOGEN 0.2 03/05/2014 2055   NITRITE NEGATIVE 03/09/2016 2001   LEUKOCYTESUR NEGATIVE 03/09/2016 2001    STUDIES: No results found.   ELIGIBLE FOR AVAILABLE RESEARCH PROTOCOL: no   ASSESSMENT: 85 y.o. [place] woman status post left breast upper outer quadrant biopsy 05/21/2016 for ductal carcinoma in situ, high-grade, estrogen and progesterone receptor positive  (1) opted against surgery   (2) started anastrozole neoadjuvantly 06/13/2016, discontinued June 2018 due to osteoporosis concerns  (a)  bone density 11/16/2016 shows a T score of -4.7 (osteoporosis)-on fosamax.   (3) mammography/ultrasonography 06/10/2018 shows evidence of disease progression, possible lymph node extension  (4) letrozole started 06/27/2018   (a) discontinued November 2019 with evidence of progression  (5) fulvestrant started 11/21/2018   PLAN: Ms. Quast tumor is essentially unchanged.  I would feel more comfortable if this were removed.  I am concerned that it may eventually become  invasive, break through the skin, and cause her significant problems.  Today after much discussion she agreed to surgical evaluation.  If the lumpectomy could be done under local I think she would tolerated just fine  Otherwise we are continuing the Faslodex on a  monthly basis.  She will see me again in a month.  She knows to call for any other issue that may develop before the next visit here.  Magrinat, Virgie Dad, MD  02/12/19 10:52 AM Medical Oncology and Hematology [facility] [address] [place], [place] [address] Tel. [phone]    Fax. [phone]  I, Jacqualyn Posey am acting as a Education administrator for Chauncey Cruel, MD.   I, Lurline Del MD, have reviewed the above documentation for accuracy and completeness, and I agree with the above.

## 2019-02-12 ENCOUNTER — Inpatient Hospital Stay: Payer: Medicare Other | Attending: Oncology

## 2019-02-12 ENCOUNTER — Telehealth: Payer: Self-pay | Admitting: Oncology

## 2019-02-12 ENCOUNTER — Inpatient Hospital Stay: Payer: Medicare Other

## 2019-02-12 ENCOUNTER — Inpatient Hospital Stay (HOSPITAL_BASED_OUTPATIENT_CLINIC_OR_DEPARTMENT_OTHER): Payer: Medicare Other | Admitting: Oncology

## 2019-02-12 VITALS — BP 158/67 | HR 55 | Temp 98.5°F | Resp 18 | Ht 61.0 in | Wt 161.3 lb

## 2019-02-12 DIAGNOSIS — M810 Age-related osteoporosis without current pathological fracture: Secondary | ICD-10-CM

## 2019-02-12 DIAGNOSIS — Z79899 Other long term (current) drug therapy: Secondary | ICD-10-CM | POA: Diagnosis not present

## 2019-02-12 DIAGNOSIS — D0512 Intraductal carcinoma in situ of left breast: Secondary | ICD-10-CM | POA: Diagnosis not present

## 2019-02-12 DIAGNOSIS — Z170 Estrogen receptor positive status [ER+]: Secondary | ICD-10-CM | POA: Insufficient documentation

## 2019-02-12 DIAGNOSIS — C50812 Malignant neoplasm of overlapping sites of left female breast: Secondary | ICD-10-CM

## 2019-02-12 LAB — CBC WITH DIFFERENTIAL/PLATELET
Abs Immature Granulocytes: 0.01 10*3/uL (ref 0.00–0.07)
Basophils Absolute: 0 10*3/uL (ref 0.0–0.1)
Basophils Relative: 1 %
Eosinophils Absolute: 0.1 10*3/uL (ref 0.0–0.5)
Eosinophils Relative: 2 %
HCT: 38 % (ref 36.0–46.0)
Hemoglobin: 11.6 g/dL — ABNORMAL LOW (ref 12.0–15.0)
Immature Granulocytes: 0 %
Lymphocytes Relative: 32 %
Lymphs Abs: 1.3 10*3/uL (ref 0.7–4.0)
MCH: 29.4 pg (ref 26.0–34.0)
MCHC: 30.5 g/dL (ref 30.0–36.0)
MCV: 96.2 fL (ref 80.0–100.0)
Monocytes Absolute: 0.3 10*3/uL (ref 0.1–1.0)
Monocytes Relative: 7 %
Neutro Abs: 2.4 10*3/uL (ref 1.7–7.7)
Neutrophils Relative %: 58 %
Platelets: 184 10*3/uL (ref 150–400)
RBC: 3.95 MIL/uL (ref 3.87–5.11)
RDW: 15.4 % (ref 11.5–15.5)
WBC: 4.2 10*3/uL (ref 4.0–10.5)
nRBC: 0 % (ref 0.0–0.2)

## 2019-02-12 LAB — COMPREHENSIVE METABOLIC PANEL
ALT: 23 U/L (ref 0–44)
AST: 27 U/L (ref 15–41)
Albumin: 3.9 g/dL (ref 3.5–5.0)
Alkaline Phosphatase: 67 U/L (ref 38–126)
Anion gap: 9 (ref 5–15)
BUN: 16 mg/dL (ref 8–23)
CO2: 26 mmol/L (ref 22–32)
Calcium: 9.8 mg/dL (ref 8.9–10.3)
Chloride: 110 mmol/L (ref 98–111)
Creatinine, Ser: 0.97 mg/dL (ref 0.44–1.00)
GFR calc Af Amer: 60 mL/min — ABNORMAL LOW (ref >60)
GFR calc non Af Amer: 51 mL/min — ABNORMAL LOW (ref >60)
Glucose, Bld: 87 mg/dL (ref 70–99)
Potassium: 3.6 mmol/L (ref 3.5–5.1)
Sodium: 145 mmol/L (ref 135–145)
Total Bilirubin: 0.8 mg/dL (ref 0.3–1.2)
Total Protein: 7.7 g/dL (ref 6.5–8.1)

## 2019-02-12 MED ORDER — FULVESTRANT 250 MG/5ML IM SOLN
500.0000 mg | Freq: Once | INTRAMUSCULAR | Status: AC
  Administered 2019-02-12: 500 mg via INTRAMUSCULAR

## 2019-02-12 MED ORDER — FULVESTRANT 250 MG/5ML IM SOLN
INTRAMUSCULAR | Status: AC
  Filled 2019-02-12: qty 5

## 2019-02-12 NOTE — Telephone Encounter (Signed)
Gave avs and calendar ° °

## 2019-02-19 ENCOUNTER — Other Ambulatory Visit: Payer: Self-pay | Admitting: Internal Medicine

## 2019-02-25 ENCOUNTER — Other Ambulatory Visit: Payer: Self-pay | Admitting: Internal Medicine

## 2019-02-26 ENCOUNTER — Ambulatory Visit (INDEPENDENT_AMBULATORY_CARE_PROVIDER_SITE_OTHER): Payer: Medicare Other | Admitting: Internal Medicine

## 2019-02-26 ENCOUNTER — Encounter: Payer: Self-pay | Admitting: Internal Medicine

## 2019-02-26 VITALS — BP 140/80 | HR 69 | Temp 97.5°F | Ht 61.0 in | Wt 163.0 lb

## 2019-02-26 DIAGNOSIS — L853 Xerosis cutis: Secondary | ICD-10-CM

## 2019-02-26 DIAGNOSIS — I10 Essential (primary) hypertension: Secondary | ICD-10-CM | POA: Diagnosis not present

## 2019-02-26 DIAGNOSIS — F0150 Vascular dementia without behavioral disturbance: Secondary | ICD-10-CM

## 2019-02-26 DIAGNOSIS — E890 Postprocedural hypothyroidism: Secondary | ICD-10-CM

## 2019-02-26 DIAGNOSIS — M810 Age-related osteoporosis without current pathological fracture: Secondary | ICD-10-CM | POA: Diagnosis not present

## 2019-02-26 DIAGNOSIS — D0512 Intraductal carcinoma in situ of left breast: Secondary | ICD-10-CM

## 2019-02-26 NOTE — Patient Instructions (Signed)
I recommend oil of olay quench body wash for your dry skin.  Then apply your mix of vaseline/cocoa butter/shea butter twice a day (after bathing and before bed). Drink 3 of those big red cups of water each day, plus your milk.

## 2019-02-26 NOTE — Progress Notes (Signed)
Location:  [facility] clinic Provider:  Brick Ketcher L. Mariea Clonts, D.O., C.M.D.  Code Status: DNR Goals of Care:  Advanced Directives 10/27/2018  Does Patient Have a Medical Advance Directive? Yes  Type of Advance Directive Out of facility DNR (pink MOST or yellow form)  Does patient want to make changes to medical advance directive? No - Patient declined  Copy of [place] in Chart? -  Would patient like information on creating a medical advance directive? -  Pre-existing out of facility DNR order (yellow form or pink MOST form) Yellow form placed in chart (order not valid for inpatient use)     Chief Complaint  Patient presents with  . Medical Management of Chronic Issues    9mth follow-up    HPI: Patient is a 85 y.o. female seen today for medical management of chronic diseases.    Breast cancer:  Is now getting fulvestrant (faslodex) infusions with Dr. Jana Hakim due to concerns her tumor will grow w/o them.  She had some new symptoms in the left breast before starting on this.  Says the only thing that bothers her is getting the injection itself.    Asks about here dry fingernails and dry skin.  Labs from oncology reviewed.  H/h stable.    Tolerating fosamax.  Continue vitamin D and ca with D.    Bowels moves slow, but meds help.  Again encouraged hydration.  Recommended shingles vaccine several times and not yet done due to cost even with drug coverage at pharmacy.  Past Medical History:  Diagnosis Date  . Alzheimer's disease ([place])   . Cancer ([place])   . Disorder of bone and cartilage, unspecified   . Dizziness and giddiness   . Hyperlipidemia LDL goal < 100   . Hypertension   . Hypothyroidism   . Malignant neoplasm of thyroid gland ([place])   . Nontoxic uninodular goiter   . Other malaise and fatigue   . Other symptoms involving cardiovascular system   . Phlebitis and thrombophlebitis of unspecified site   . Senile osteoporosis   . Sleep related leg cramps   .  Stroke ([place])   . Unspecified disorder of lipoid metabolism   . Unspecified hereditary and idiopathic peripheral neuropathy   . Varicose veins of lower extremities with inflammation     Past Surgical History:  Procedure Laterality Date  . ABDOMINAL HYSTERECTOMY     DR HUGES  . BREAST BIOPSY Left 05/21/2016   malignant  . CATRACT SURGERY  2008  . COLON SURGERY    . KNEE SURGERY     right  . PARATHYROID ADENOMA REMOVED  1983  . THYROID SURGERY  July 20, 2011    No Known Allergies  Outpatient Encounter Medications as of 02/26/2019  Medication Sig  . alendronate (FOSAMAX) 70 MG tablet TAKE 1 TABLET (70 MG TOTAL) BY MOUTH EVERY 7 (SEVEN) DAYS. TAKE WITH A FULL GLASS OF WATER ON AN EMPTY STOMACH.  [place] Kitchen allopurinol (ZYLOPRIM) 300 MG tablet TAKE 1 TABLET EVERY DAY  . amLODipine (NORVASC) 10 MG tablet TAKE 1 TABLET EVERY DAY  . aspirin EC 81 MG tablet Take 1 tablet (81 mg total) by mouth daily.  [place] Kitchen atenolol (TENORMIN) 25 MG tablet Take 1 tablet (25 mg total) by mouth daily.  [place] Kitchen atorvastatin (LIPITOR) 40 MG tablet TAKE 1 TABLET EVERY DAY  . calcium carbonate (TUMS - DOSED IN MG ELEMENTAL CALCIUM) 500 MG chewable tablet Chew 1 tablet by mouth as needed for indigestion or heartburn.  [place] Kitchen  Cholecalciferol (VITAMIN D) 2000 units CAPS Take 1 capsule (2,000 Units total) by mouth daily.  . iron polysaccharides (NIFEREX) 150 MG capsule Take 1 capsule (150 mg total) by mouth every other day.  . levothyroxine (SYNTHROID, LEVOTHROID) 112 MCG tablet Take 112 mcg by mouth daily before breakfast. RX'ed Dr.Balan  . lisinopril (PRINIVIL,ZESTRIL) 2.5 MG tablet TAKE ONE TABLET BY MOUTH ONCE DAILY TO CONTROL BLOOD PRESSURE  . polyethylene glycol powder (GLYCOLAX/MIRALAX) powder 17 grams in 8oz of water or juice now and if no Bowel Movement by bedtime, repeat Miralax dose. If this is unsuccessful call office.  . sertraline (ZOLOFT) 50 MG tablet TAKE ONE TABLET DAILY FOR DEPRESSION   No facility-administered encounter  medications on file as of 02/26/2019.     Review of Systems:  Review of Systems  Constitutional: Negative for chills, fever and malaise/fatigue.  HENT: Positive for hearing loss.   Eyes: Negative for blurred vision.  Respiratory: Negative for cough and shortness of breath.   Cardiovascular: Negative for chest pain, palpitations and leg swelling.  Gastrointestinal: Negative for abdominal pain, blood in stool, constipation and diarrhea.  Genitourinary: Negative for dysuria.  Musculoskeletal: Negative for falls.       Walks with rolling walker  Skin: Negative for itching and rash.       Dry scaly skin  Neurological: Positive for focal weakness. Negative for dizziness and loss of consciousness.       Chronic right-sided weakness after cva  Endo/Heme/Allergies: Does not bruise/bleed easily.  Psychiatric/Behavioral: Positive for memory loss. Negative for depression. The patient is not nervous/anxious.     Health Maintenance  Topic Date Due  . TETANUS/TDAP  06/23/2028  . INFLUENZA VACCINE  Completed  . DEXA SCAN  Completed  . PNA vac Low Risk Adult  Completed    Physical Exam: Vitals:   02/26/19 1103  BP: 140/80  Pulse: 69  Temp: (!) 97.5 F (36.4 C)  TempSrc: Oral  SpO2: 98%  Weight: 163 lb (73.9 kg)  Height: 5\' 1"  (1.549 m)   Body mass index is 30.8 kg/m. Physical Exam Vitals signs reviewed.  Constitutional:      Appearance: Normal appearance.  HENT:     Head: Normocephalic and atraumatic.     Ears:     Comments: HOH Eyes:     Comments: glasses  Cardiovascular:     Rate and Rhythm: Normal rate and regular rhythm.     Pulses: Normal pulses.     Heart sounds: Normal heart sounds.  Pulmonary:     Effort: Pulmonary effort is normal.     Breath sounds: Normal breath sounds. No rales.  Abdominal:     General: Bowel sounds are normal.  Musculoskeletal: Normal range of motion.     Comments: Walks with rolling walker with skis  Skin:    General: Skin is warm and  dry.     Comments: Dry scaly skin of arms and legs  Neurological:     General: No focal deficit present.     Mental Status: She is alert and oriented to person, place, and time. Mental status is at baseline.     Comments: But has short-term memory loss that got worse post-cva  Psychiatric:        Mood and Affect: Mood normal.     Labs reviewed: Basic Metabolic Panel: Recent Labs    08/25/18 1249 11/13/18 1244 02/12/19 1003  NA 142 146* 145  K 4.1 3.9 3.6  CL 108 108 110  CO2  27 28 26   GLUCOSE 81 84 87  BUN 15 18 16   CREATININE 0.99 1.02* 0.97  CALCIUM 10.1 10.2 9.8   Liver Function Tests: Recent Labs    08/25/18 1249 11/13/18 1244 02/12/19 1003  AST 24 29 27   ALT 17 23 23   ALKPHOS 64 72 67  BILITOT 0.6 0.8 0.8  PROT 7.7 7.6 7.7  ALBUMIN 3.9 3.9 3.9   No results for input(s): LIPASE, AMYLASE in the last 8760 hours. No results for input(s): AMMONIA in the last 8760 hours. CBC: Recent Labs    08/25/18 1249 11/13/18 1244 02/12/19 1003  WBC 3.9 4.3 4.2  NEUTROABS 2.1 2.5 2.4  HGB 11.4* 11.4* 11.6*  HCT 35.4 36.7 38.0  MCV 91.0 94.6 96.2  PLT 157 178 184   Lipid Panel: No results for input(s): CHOL, HDL, LDLCALC, TRIG, CHOLHDL, LDLDIRECT in the last 8760 hours. Lab Results  Component Value Date   HGBA1C 5.0 11/09/2015    Procedures since last visit: No results found.  Assessment/Plan 1. Xerosis of skin -begin olay quench body wash, faithfully use compounded cream she has  2. Vascular dementia without behavioral disturbance (HCC) -cont support of sister, life alert button--knows how to use and when  3. Ductal carcinoma in situ (DCIS) of left breast -getting fulvestrant per Dr. Jana Hakim -seems to be tolerating well thus far -monitor  4. Senile osteoporosis -cont fosamax therapy, ca with D and additional D3  5. Hypothyroidism, postsurgical -cont levothyroxine therapy  6. Essential hypertension, benign -bp at goal given her age and  comorbidities  She continues to tolerate her meds as is so I opted not to cut back on them at this point.  Labs/tests ordered:  No orders of the defined types were placed in this encounter. no new--reviewed onc labs  Next appt:  06/18/2019  Reannah Totten L. Peyten Weare, D.O. [facility] [address], [place] [address] Cell Phone (Mon-Fri 8am-5pm):  [phone] On Call:  [phone] & follow prompts after 5pm & weekends Office Phone:  [phone] Office Fax:  [phone]

## 2019-03-12 ENCOUNTER — Other Ambulatory Visit: Payer: Self-pay

## 2019-03-12 ENCOUNTER — Inpatient Hospital Stay: Payer: Medicare Other | Attending: Oncology | Admitting: Adult Health

## 2019-03-12 ENCOUNTER — Inpatient Hospital Stay: Payer: Medicare Other

## 2019-03-12 ENCOUNTER — Encounter: Payer: Self-pay | Admitting: Adult Health

## 2019-03-12 VITALS — BP 175/80 | HR 63 | Temp 98.6°F | Resp 17 | Ht 61.0 in | Wt 161.4 lb

## 2019-03-12 DIAGNOSIS — E039 Hypothyroidism, unspecified: Secondary | ICD-10-CM | POA: Insufficient documentation

## 2019-03-12 DIAGNOSIS — Z170 Estrogen receptor positive status [ER+]: Principal | ICD-10-CM

## 2019-03-12 DIAGNOSIS — D0512 Intraductal carcinoma in situ of left breast: Secondary | ICD-10-CM | POA: Insufficient documentation

## 2019-03-12 DIAGNOSIS — Z90710 Acquired absence of both cervix and uterus: Secondary | ICD-10-CM | POA: Insufficient documentation

## 2019-03-12 DIAGNOSIS — E785 Hyperlipidemia, unspecified: Secondary | ICD-10-CM | POA: Insufficient documentation

## 2019-03-12 DIAGNOSIS — C50812 Malignant neoplasm of overlapping sites of left female breast: Secondary | ICD-10-CM

## 2019-03-12 DIAGNOSIS — Z79811 Long term (current) use of aromatase inhibitors: Secondary | ICD-10-CM | POA: Insufficient documentation

## 2019-03-12 DIAGNOSIS — I10 Essential (primary) hypertension: Secondary | ICD-10-CM | POA: Diagnosis not present

## 2019-03-12 DIAGNOSIS — Z7982 Long term (current) use of aspirin: Secondary | ICD-10-CM | POA: Diagnosis not present

## 2019-03-12 DIAGNOSIS — Z79899 Other long term (current) drug therapy: Secondary | ICD-10-CM

## 2019-03-12 DIAGNOSIS — Z8673 Personal history of transient ischemic attack (TIA), and cerebral infarction without residual deficits: Secondary | ICD-10-CM | POA: Insufficient documentation

## 2019-03-12 DIAGNOSIS — Z5111 Encounter for antineoplastic chemotherapy: Secondary | ICD-10-CM | POA: Insufficient documentation

## 2019-03-12 LAB — CBC WITH DIFFERENTIAL/PLATELET
Abs Immature Granulocytes: 0.01 10*3/uL (ref 0.00–0.07)
Basophils Absolute: 0 10*3/uL (ref 0.0–0.1)
Basophils Relative: 1 %
Eosinophils Absolute: 0.1 10*3/uL (ref 0.0–0.5)
Eosinophils Relative: 3 %
HCT: 35 % — ABNORMAL LOW (ref 36.0–46.0)
Hemoglobin: 10.6 g/dL — ABNORMAL LOW (ref 12.0–15.0)
Immature Granulocytes: 0 %
Lymphocytes Relative: 34 %
Lymphs Abs: 1.3 10*3/uL (ref 0.7–4.0)
MCH: 28.9 pg (ref 26.0–34.0)
MCHC: 30.3 g/dL (ref 30.0–36.0)
MCV: 95.4 fL (ref 80.0–100.0)
Monocytes Absolute: 0.3 10*3/uL (ref 0.1–1.0)
Monocytes Relative: 8 %
Neutro Abs: 2.2 10*3/uL (ref 1.7–7.7)
Neutrophils Relative %: 54 %
Platelets: 169 10*3/uL (ref 150–400)
RBC: 3.67 MIL/uL — ABNORMAL LOW (ref 3.87–5.11)
RDW: 15.3 % (ref 11.5–15.5)
WBC: 4 10*3/uL (ref 4.0–10.5)
nRBC: 0 % (ref 0.0–0.2)

## 2019-03-12 LAB — COMPREHENSIVE METABOLIC PANEL
ALT: 29 U/L (ref 0–44)
AST: 31 U/L (ref 15–41)
Albumin: 3.7 g/dL (ref 3.5–5.0)
Alkaline Phosphatase: 61 U/L (ref 38–126)
Anion gap: 8 (ref 5–15)
BUN: 19 mg/dL (ref 8–23)
CO2: 27 mmol/L (ref 22–32)
Calcium: 9.3 mg/dL (ref 8.9–10.3)
Chloride: 108 mmol/L (ref 98–111)
Creatinine, Ser: 0.93 mg/dL (ref 0.44–1.00)
GFR calc Af Amer: >60 mL/min (ref >60)
GFR calc non Af Amer: 54 mL/min — ABNORMAL LOW (ref >60)
Glucose, Bld: 80 mg/dL (ref 70–99)
Potassium: 3.7 mmol/L (ref 3.5–5.1)
Sodium: 143 mmol/L (ref 135–145)
Total Bilirubin: 0.7 mg/dL (ref 0.3–1.2)
Total Protein: 7.2 g/dL (ref 6.5–8.1)

## 2019-03-12 MED ORDER — FULVESTRANT 250 MG/5ML IM SOLN
INTRAMUSCULAR | Status: AC
  Filled 2019-03-12: qty 10

## 2019-03-12 MED ORDER — FULVESTRANT 250 MG/5ML IM SOLN
500.0000 mg | Freq: Once | INTRAMUSCULAR | Status: AC
  Administered 2019-03-12: 500 mg via INTRAMUSCULAR

## 2019-03-12 NOTE — Progress Notes (Signed)
[facility]  Telephone:(336) [phone] Fax:(336) [phone]    ID: Shelia Marks DOB: [birth year]  MR#: [id number]  HBZ#:[id number]  Patient Care Team: Gayland Curry, DO as PCP - General (Geriatric Medicine) Rutherford Guys, MD as Consulting Physician (Ophthalmology) Magrinat, Virgie Dad, MD as Consulting Physician (Oncology) Jovita Kussmaul, MD as Consulting Physician (General Surgery) Jacelyn Pi, MD as Consulting Physician (Endocrinology) OTHER MD:   CHIEF COMPLAINT: Estrogen receptor positive breast cancer  CURRENT TREATMENT: Fulvestrant   BREAST CANCER HISTORY: From the original intake note:  "Shelia Marks" had bilateral screening mammography at [facility] 06/07/2016 showing calcifications in the left breast. She was recalled  05/15/2016 for left diagnostic mammography. This showed the breast density to be category be. In the upper outer left breast there was an area of suspicious calcifications measuring 1.7 cm.  Biopsy of this area was obtained 05/21/2016, and showed ([id number]) ductal carcinoma in situ, high-grade, estrogen receptor 100% positive, progesterone receptor 5% positive, WITH strong staining intensity.  Her subsequent history is as detailed below.   INTERVAL HISTORY: Shelia Marks returns today for follow-up and treatment of her estrogen receptor positive breast cancer. She is accompanied by a friend from [place], [place], and her sister, Shelia Marks.  She continues on fulvestrant. Today will be her fifth dose.  Per Dr. Virgie Dad last note, he recommended that she see Dr. Marlou Starks to have her breast cancer removed, because he was concerned that it had grown through the Letrozole, and had remained the same size since starting the Fulvestrant.  Shelia Marks says she doesn't remember Dr. Jana Hakim recommending this.    REVIEW OF SYSTEMS: Shelia Marks is feeling well.  She is tolerating the Fulvestrant moderately well.  She has some soreness at the injection sites.   She denies any other issues today.  She hasn't noted appetite decrease, weight loss, nausea, vomiting, bowel or bladder concerns.  She is without chest pain, palpitations, cough, or shortness of breath.  A detailed ROS was otherwise non contributory.     PAST MEDICAL HISTORY: Past Medical History:  Diagnosis Date  . Alzheimer's disease ([place])   . Cancer ([place])   . Disorder of bone and cartilage, unspecified   . Dizziness and giddiness   . Hyperlipidemia LDL goal < 100   . Hypertension   . Hypothyroidism   . Malignant neoplasm of thyroid gland ([place])   . Nontoxic uninodular goiter   . Other malaise and fatigue   . Other symptoms involving cardiovascular system   . Phlebitis and thrombophlebitis of unspecified site   . Senile osteoporosis   . Sleep related leg cramps   . Stroke ([place])   . Unspecified disorder of lipoid metabolism   . Unspecified hereditary and idiopathic peripheral neuropathy   . Varicose veins of lower extremities with inflammation     PAST SURGICAL HISTORY: Past Surgical History:  Procedure Laterality Date  . ABDOMINAL HYSTERECTOMY     DR HUGES  . BREAST BIOPSY Left 05/21/2016   malignant  . CATRACT SURGERY  2008  . COLON SURGERY    . KNEE SURGERY     right  . PARATHYROID ADENOMA REMOVED  1983  . THYROID SURGERY  July 20, 2011    FAMILY HISTORY Family History  Problem Relation Age of Onset  . Diabetes Brother    The patient's father died from liver problems in his 11s. The patient's mother died in her 48s from "natural causes". The patient had no brothers. She had 6 sisters, 2 of whom died  from complications of diabetes and one from a ruptured aneurysm. There is no history of breast or ovarian cancer in the family.    GYNECOLOGIC HISTORY:  No LMP recorded. Patient has had a hysterectomy.  Shelia Marks does not remember how she was when she had her first period. She never carried a child to term. He is status post hysterectomy and at least one ovary was  removed. She never took hormone replacement.    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    ADVANCED DIRECTIVES: [name], Shelia Marks, is her healthcare power of attorney. Shelia Marks can be reached at [phone].   HEALTH MAINTENANCE: Social History   Tobacco Use  . Smoking status: Never Smoker  . Smokeless tobacco: Never Used  Substance Use Topics  . Alcohol use: No  . Drug use: No     No Known Allergies  Current Outpatient Medications  Medication Sig Dispense Refill  . alendronate (FOSAMAX) 70 MG tablet TAKE 1 TABLET (70 MG TOTAL) BY MOUTH EVERY 7 (SEVEN) DAYS. TAKE WITH A FULL GLASS OF WATER ON AN EMPTY STOMACH. 12 tablet 11  . allopurinol (ZYLOPRIM) 300 MG tablet TAKE 1 TABLET EVERY DAY 90 tablet 1  . amLODipine (NORVASC) 10 MG tablet TAKE 1 TABLET EVERY DAY 90 tablet 1  . aspirin EC 81 MG tablet Take 1 tablet (81 mg total) by mouth daily. 30 tablet 3  . atenolol (TENORMIN) 25 MG tablet Take 1 tablet (25 mg total) by mouth daily. 7 tablet 0  . atorvastatin (LIPITOR) 40 MG tablet TAKE 1 TABLET EVERY DAY 90 tablet 1  . calcium carbonate (TUMS - DOSED IN MG ELEMENTAL CALCIUM) 500 MG chewable tablet Chew 1 tablet by mouth as needed for indigestion or heartburn.    . Cholecalciferol (VITAMIN D) 2000 units CAPS Take 1 capsule (2,000 Units total) by mouth daily. 30 capsule 2  . iron polysaccharides (NIFEREX) 150 MG capsule Take 1 capsule (150 mg total) by mouth every other day. 15 capsule 3  . levothyroxine (SYNTHROID, LEVOTHROID) 112 MCG tablet Take 112 mcg by mouth daily before breakfast. RX'ed Dr.Balan    . lisinopril (PRINIVIL,ZESTRIL) 2.5 MG tablet TAKE ONE TABLET BY MOUTH ONCE DAILY TO CONTROL BLOOD PRESSURE 90 tablet 1  . polyethylene glycol powder (GLYCOLAX/MIRALAX) powder 17 grams in 8oz of water or juice now and if no Bowel Movement by bedtime, repeat Miralax dose. If this is  unsuccessful call office. 119 g 0  . sertraline (ZOLOFT) 50 MG tablet TAKE ONE TABLET DAILY FOR DEPRESSION 90 tablet 1   No current facility-administered medications for this visit.     OBJECTIVE: Older African-American woman examined in a wheelchair Vitals:   03/12/19 1444  BP: (!) 175/80  Pulse: 63  Resp: 17  Temp: 98.6 F (37 C)  SpO2: 99%     Body mass index is 30.5 kg/m.    ECOG FS:2 - Symptomatic, <50% confined to bed  GENERAL: Patient is an older woman in no acute distress HEENT:  Sclerae anicteric.  Oropharynx clear and moist. NODES:  No cervical, supraclavicular, or axillary lymphadenopathy palpated.  BREAST EXAM:  Left breast mass about 1-2cm noted LUNGS:  Clear to auscultation bilaterally.  No wheezes or rhonchi. HEART:  Regular rate and rhythm. No murmur appreciated. ABDOMEN:  Soft, nontender.  Positive, normoactive bowel sounds. No organomegaly palpated. MSK:  No focal spinal tenderness to  palpation.  EXTREMITIES:  No peripheral edema.   SKIN:  Clear with no obvious rashes or skin changes. No nail dyscrasia. NEURO:  Nonfocal. Well oriented.  Appropriate affect.    LAB RESULTS:  CMP     Component Value Date/Time   NA 145 02/12/2019 1003   NA 143 06/01/2016 0958   K 3.6 02/12/2019 1003   CL 110 02/12/2019 1003   CO2 26 02/12/2019 1003   GLUCOSE 87 02/12/2019 1003   BUN 16 02/12/2019 1003   BUN 21 06/01/2016 0958   CREATININE 0.97 02/12/2019 1003   CREATININE 0.99 (H) 02/25/2018 0937   CALCIUM 9.8 02/12/2019 1003   PROT 7.7 02/12/2019 1003   PROT 6.8 11/09/2015 1013   ALBUMIN 3.9 02/12/2019 1003   ALBUMIN 4.1 11/09/2015 1013   AST 27 02/12/2019 1003   ALT 23 02/12/2019 1003   ALKPHOS 67 02/12/2019 1003   BILITOT 0.8 02/12/2019 1003   BILITOT 0.5 11/09/2015 1013   GFRNONAA 51 (L) 02/12/2019 1003   GFRNONAA 51 (L) 02/25/2018 0937   GFRAA 60 (L) 02/12/2019 1003   GFRAA 59 (L) 02/25/2018 0937    INo results found for: SPEP, UPEP  Lab Results   Component Value Date   WBC 4.0 03/12/2019   NEUTROABS 2.2 03/12/2019   HGB 10.6 (L) 03/12/2019   HCT 35.0 (L) 03/12/2019   MCV 95.4 03/12/2019   PLT 169 03/12/2019      Chemistry      Component Value Date/Time   NA 145 02/12/2019 1003   NA 143 06/01/2016 0958   K 3.6 02/12/2019 1003   CL 110 02/12/2019 1003   CO2 26 02/12/2019 1003   BUN 16 02/12/2019 1003   BUN 21 06/01/2016 0958   CREATININE 0.97 02/12/2019 1003   CREATININE 0.99 (H) 02/25/2018 0937      Component Value Date/Time   CALCIUM 9.8 02/12/2019 1003   ALKPHOS 67 02/12/2019 1003   AST 27 02/12/2019 1003   ALT 23 02/12/2019 1003   BILITOT 0.8 02/12/2019 1003   BILITOT 0.5 11/09/2015 1013       No results found for: LABCA2  No components found for: LABCA125  No results for input(s): INR in the last 168 hours.  Urinalysis    Component Value Date/Time   COLORURINE STRAW (A) 03/09/2016 2001   APPEARANCEUR CLEAR 03/09/2016 2001   LABSPEC 1.006 03/09/2016 2001   PHURINE 7.5 03/09/2016 2001   GLUCOSEU NEGATIVE 03/09/2016 2001   HGBUR NEGATIVE 03/09/2016 2001   [place] NEGATIVE 03/09/2016 2001   Manuel Garcia 03/09/2016 2001   PROTEINUR NEGATIVE 03/09/2016 2001   UROBILINOGEN 0.2 03/05/2014 2055   NITRITE NEGATIVE 03/09/2016 2001   LEUKOCYTESUR NEGATIVE 03/09/2016 2001    STUDIES: No results found.   ELIGIBLE FOR AVAILABLE RESEARCH PROTOCOL: no   ASSESSMENT: 85 y.o. Shelia Marks woman status post left breast upper outer quadrant biopsy 05/21/2016 for ductal carcinoma in situ, high-grade, estrogen and progesterone receptor positive  (1) opted against surgery   (2) started anastrozole neoadjuvantly 06/13/2016, discontinued June 2018 due to osteoporosis concerns  (a)  bone density 11/16/2016 shows a T score of -4.7 (osteoporosis)-on fosamax.   (3) mammography/ultrasonography 06/10/2018 shows evidence of disease progression, possible lymph node extension  (4) letrozole started 06/27/2018    (a) discontinued November 2019 with evidence of progression  (5) fulvestrant started 11/21/2018   PLAN: Ms. Grays is tolerating the Fulvestrant well.  She will receive this today.  We reviewed Dr. Virgie Dad recommendations per her last appointment  with her.  She is very hard of hearing, so I also wrote out the recommendations, printed them in her AVS and gave them to her.  It stated this:   "Dr. Jana Hakim has recommended that you have surgery to remove the tumor in your left breast.  You have  previously seen Dr. Marlou Starks.  I am happy to set this up for you.  His office, at [facility] is  [phone].  Dr. Marlou Starks would be happy to review with you having a lumpectomy.  If you do not have the  lumpectomy, there is a risk that the tumor can grow and spread to the lymph nodes, and throughout the body.  We  will continue on Faslodex injections every 4 weeks.  You will be due for repeat mammogram and ultrasound in May,  2020." Shelia Marks will consider meeting with Dr. Marlou Starks and will call and let me know if she decides to have surgery.  We will see her back in four weeks for labs, f/u, and her next fulvestrant.  She knows to call for any questions or concerns prior to her next appointment with [place].  A total of (20) minutes of face-to-face time was spent with this patient with greater than 50% of that time in counseling and care-coordination.    Wilber Bihari, NP  03/12/19 2:54 PM Medical Oncology and Hematology [facility] [address] [place], [place] [address] Tel. [phone]    Fax. [phone]

## 2019-03-12 NOTE — Patient Instructions (Signed)
Dr. Jana Hakim has recommended that you have surgery to remove the tumor in your left breast.  You have previously seen Dr. Marlou Starks.  I am happy to set this up for you.  His office, at [facility] is [phone].  Dr. Marlou Starks would be happy to review with you having a lumpectomy.  If you do not have the lumpectomy, there is a risk that the tumor can grow and spread to the lymph nodes, and throughout the body.  We will continue on Faslodex injections every 4 weeks.  You will be due for repeat mammogram and ultrasound in May, 2020.

## 2019-03-23 ENCOUNTER — Other Ambulatory Visit: Payer: Self-pay | Admitting: Internal Medicine

## 2019-03-26 ENCOUNTER — Ambulatory Visit: Payer: Medicare Other | Admitting: Oncology

## 2019-04-07 ENCOUNTER — Telehealth: Payer: Self-pay | Admitting: Adult Health

## 2019-04-07 NOTE — Telephone Encounter (Signed)
Called per 4/7 sch message . Pt unsure if she should come in - message sent to MD

## 2019-04-08 ENCOUNTER — Other Ambulatory Visit: Payer: Self-pay | Admitting: Oncology

## 2019-04-08 MED ORDER — ANASTROZOLE 1 MG PO TABS: 1.0000 mg | ORAL_TABLET | Freq: Every day | ORAL | 4 refills | Status: DC

## 2019-04-08 NOTE — Progress Notes (Signed)
I called the patient and discussed with her caregiver that we are canceling visits August.  They will call if they have any questions.

## 2019-04-09 ENCOUNTER — Ambulatory Visit: Payer: Medicare Other

## 2019-04-09 ENCOUNTER — Inpatient Hospital Stay: Payer: Medicare Other | Attending: Oncology

## 2019-04-09 ENCOUNTER — Inpatient Hospital Stay: Payer: Medicare Other

## 2019-04-09 ENCOUNTER — Inpatient Hospital Stay: Payer: Medicare Other | Admitting: Adult Health

## 2019-04-09 NOTE — Progress Notes (Deleted)
[place]  [phone] Fax:([phone]) [phone]    ID: Jyl Chico DOB: 1926-11-17  MR#: [id number]  VFI#:[id number]  Patient Care Team: Gayland Curry, DO as PCP - General (Geriatric Medicine) Rutherford Guys, MD as Consulting Physician (Ophthalmology) Magrinat, Virgie Dad, MD as Consulting Physician (Oncology) Jovita Kussmaul, MD as Consulting Physician (General Surgery) Jacelyn Pi, MD as Consulting Physician (Endocrinology) OTHER MD:   CHIEF COMPLAINT: Estrogen receptor positive breast cancer  CURRENT TREATMENT: Fulvestrant   BREAST CANCER HISTORY: From the original intake note:  "Shelia Marks" had bilateral screening mammography at the [facility] 06/07/2016 showing calcifications in the left breast. She was recalled  05/15/2016 for left diagnostic mammography. This showed the breast density to be category be. In the upper outer left breast there was an area of suspicious calcifications measuring 1.7 cm.  Biopsy of this area was obtained 05/21/2016, and showed ([id number]) ductal carcinoma in situ, high-grade, estrogen receptor 100% positive, progesterone receptor 5% positive, WITH strong staining intensity.  Her subsequent history is as detailed below.   INTERVAL HISTORY: January returns today for follow-up and treatment of her estrogen receptor positive breast cancer. She is accompanied   REVIEW OF SYSTEMS: Shelia Marks    PAST MEDICAL HISTORY: Past Medical History:  Diagnosis Date  . Alzheimer's disease ([place])   . Cancer ([place])   . Disorder of bone and cartilage, unspecified   . Dizziness and giddiness   . Hyperlipidemia LDL goal < 100   . Hypertension   . Hypothyroidism   . Malignant neoplasm of thyroid gland ([place])   . Nontoxic uninodular goiter   . Other malaise and fatigue   . Other symptoms involving cardiovascular system   . Phlebitis and thrombophlebitis of unspecified site   . Senile osteoporosis   . Sleep related leg cramps    . Stroke ([place])   . Unspecified disorder of lipoid metabolism   . Unspecified hereditary and idiopathic peripheral neuropathy   . Varicose veins of lower extremities with inflammation     PAST SURGICAL HISTORY: Past Surgical History:  Procedure Laterality Date  . ABDOMINAL HYSTERECTOMY     DR HUGES  . BREAST BIOPSY Left 05/21/2016   malignant  . CATRACT SURGERY  2008  . COLON SURGERY    . KNEE SURGERY     right  . PARATHYROID ADENOMA REMOVED  1983  . THYROID SURGERY  July 20, 2011    FAMILY HISTORY Family History  Problem Relation Age of Onset  . Diabetes Brother    The patient's father died from liver problems in his 46s. The patient's mother died in her 43s from "natural causes". The patient had no brothers. She had 6 sisters, 2 of whom died from complications of diabetes and one from a ruptured aneurysm. There is no history of breast or ovarian cancer in the family.    GYNECOLOGIC HISTORY:  No LMP recorded. Patient has had a hysterectomy.  Shelia Marks does not remember how she was when she had her first period. She never carried a child to term. He is status post hysterectomy and at least one ovary was removed. She never took hormone replacement.    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    ADVANCED DIRECTIVES: [name], Bryon Lions, is her healthcare power of attorney. Shelia Marks can be reached at [phone].   HEALTH MAINTENANCE: Social History   Tobacco Use  .  Smoking status: Never Smoker  . Smokeless tobacco: Never Used  Substance Use Topics  . Alcohol use: No  . Drug use: No     No Known Allergies  Current Outpatient Medications  Medication Sig Dispense Refill  . alendronate (FOSAMAX) 70 MG tablet TAKE 1 TABLET (70 MG TOTAL) BY MOUTH EVERY 7 (SEVEN) DAYS. TAKE WITH A FULL GLASS OF WATER ON AN EMPTY STOMACH. 12 tablet 11  . allopurinol (ZYLOPRIM) 300 MG  tablet TAKE 1 TABLET EVERY DAY 90 tablet 1  . amLODipine (NORVASC) 10 MG tablet TAKE 1 TABLET EVERY DAY 90 tablet 1  . anastrozole (ARIMIDEX) 1 MG tablet Take 1 tablet (1 mg total) by mouth daily. 90 tablet 4  . aspirin EC 81 MG tablet Take 1 tablet (81 mg total) by mouth daily. 30 tablet 3  . atenolol (TENORMIN) 25 MG tablet TAKE 1 TABLET EVERY DAY 90 tablet 1  . atorvastatin (LIPITOR) 40 MG tablet TAKE 1 TABLET EVERY DAY 90 tablet 1  . calcium carbonate (TUMS - DOSED IN MG ELEMENTAL CALCIUM) 500 MG chewable tablet Chew 1 tablet by mouth as needed for indigestion or heartburn.    . Cholecalciferol (VITAMIN D) 2000 units CAPS Take 1 capsule (2,000 Units total) by mouth daily. 30 capsule 2  . iron polysaccharides (NIFEREX) 150 MG capsule Take 1 capsule (150 mg total) by mouth every other day. 15 capsule 3  . levothyroxine (SYNTHROID, LEVOTHROID) 112 MCG tablet Take 112 mcg by mouth daily before breakfast. RX'ed Dr.Balan    . lisinopril (PRINIVIL,ZESTRIL) 2.5 MG tablet TAKE ONE TABLET BY MOUTH ONCE DAILY TO CONTROL BLOOD PRESSURE 90 tablet 1  . polyethylene glycol powder (GLYCOLAX/MIRALAX) powder 17 grams in 8oz of water or juice now and if no Bowel Movement by bedtime, repeat Miralax dose. If this is unsuccessful call office. 119 g 0  . sertraline (ZOLOFT) 50 MG tablet TAKE ONE TABLET DAILY FOR DEPRESSION 90 tablet 1   No current facility-administered medications for this visit.     OBJECTIVE: Older African-American woman examined in a wheelchair There were no vitals filed for this visit.   There is no height or weight on file to calculate BMI.    ECOG FS:2 - Symptomatic, <50% confined to bed  GENERAL: Patient is an older woman in no acute distress HEENT:  Sclerae anicteric.  Oropharynx clear and moist. NODES:  No cervical, supraclavicular, or axillary lymphadenopathy palpated.  BREAST EXAM:  Left breast mass about 1-2cm noted LUNGS:  Clear to auscultation bilaterally.  No wheezes or rhonchi.  HEART:  Regular rate and rhythm. No murmur appreciated. ABDOMEN:  Soft, nontender.  Positive, normoactive bowel sounds. No organomegaly palpated. MSK:  No focal spinal tenderness to palpation.  EXTREMITIES:  No peripheral edema.   SKIN:  Clear with no obvious rashes or skin changes. No nail dyscrasia. NEURO:  Nonfocal. Well oriented.  Appropriate affect.    LAB RESULTS:  CMP     Component Value Date/Time   NA 143 03/12/2019 1417   NA 143 06/01/2016 0958   K 3.7 03/12/2019 1417   CL 108 03/12/2019 1417   CO2 27 03/12/2019 1417   GLUCOSE 80 03/12/2019 1417   BUN 19 03/12/2019 1417   BUN 21 06/01/2016 0958   CREATININE 0.93 03/12/2019 1417   CREATININE 0.99 (H) 02/25/2018 0937   CALCIUM 9.3 03/12/2019 1417   PROT 7.2 03/12/2019 1417   PROT 6.8 11/09/2015 1013   ALBUMIN 3.7 03/12/2019 1417   ALBUMIN 4.1 11/09/2015  1013   AST 31 03/12/2019 1417   ALT 29 03/12/2019 1417   ALKPHOS 61 03/12/2019 1417   BILITOT 0.7 03/12/2019 1417   BILITOT 0.5 11/09/2015 1013   GFRNONAA 54 (L) 03/12/2019 1417   GFRNONAA 51 (L) 02/25/2018 0937   GFRAA >60 03/12/2019 1417   GFRAA 59 (L) 02/25/2018 0937    INo results found for: SPEP, UPEP  Lab Results  Component Value Date   WBC 4.0 03/12/2019   NEUTROABS 2.2 03/12/2019   HGB 10.6 (L) 03/12/2019   HCT 35.0 (L) 03/12/2019   MCV 95.4 03/12/2019   PLT 169 03/12/2019      Chemistry      Component Value Date/Time   NA 143 03/12/2019 1417   NA 143 06/01/2016 0958   K 3.7 03/12/2019 1417   CL 108 03/12/2019 1417   CO2 27 03/12/2019 1417   BUN 19 03/12/2019 1417   BUN 21 06/01/2016 0958   CREATININE 0.93 03/12/2019 1417   CREATININE 0.99 (H) 02/25/2018 0937      Component Value Date/Time   CALCIUM 9.3 03/12/2019 1417   ALKPHOS 61 03/12/2019 1417   AST 31 03/12/2019 1417   ALT 29 03/12/2019 1417   BILITOT 0.7 03/12/2019 1417   BILITOT 0.5 11/09/2015 1013       No results found for: LABCA2  No components found for: LABCA125   No results for input(s): INR in the last 168 hours.  Urinalysis    Component Value Date/Time   COLORURINE STRAW (A) 03/09/2016 2001   APPEARANCEUR CLEAR 03/09/2016 2001   LABSPEC 1.006 03/09/2016 2001   PHURINE 7.5 03/09/2016 2001   GLUCOSEU NEGATIVE 03/09/2016 2001   HGBUR NEGATIVE 03/09/2016 2001   Shelia Marks NEGATIVE 03/09/2016 2001   Shelia Marks 03/09/2016 2001   PROTEINUR NEGATIVE 03/09/2016 2001   UROBILINOGEN 0.2 03/05/2014 2055   NITRITE NEGATIVE 03/09/2016 2001   LEUKOCYTESUR NEGATIVE 03/09/2016 2001    STUDIES: No results found.   ELIGIBLE FOR AVAILABLE RESEARCH PROTOCOL: no   ASSESSMENT: 85 y.o. Shelia Marks woman status post left breast upper outer quadrant biopsy 05/21/2016 for ductal carcinoma in situ, high-grade, estrogen and progesterone receptor positive  (1) opted against surgery   (2) started anastrozole neoadjuvantly 06/13/2016, discontinued June 2018 due to osteoporosis concerns  (a)  bone density 11/16/2016 shows a T score of -4.7 (osteoporosis)-on fosamax.   (3) mammography/ultrasonography 06/10/2018 shows evidence of disease progression, possible lymph node extension  (4) letrozole started 06/27/2018   (a) discontinued November 2019 with evidence of progression  (5) fulvestrant started 11/21/2018   PLAN: Ms. Buechler is tolerating the Fulvestrant well.  She will receive this today.  We reviewed Dr. Virgie Dad recommendations per her last appointment with her.  She is very hard of hearing, so I also wrote out the recommendations, printed them in her AVS and gave them to her.  It stated this:   "Dr. Jana Hakim has recommended that you have surgery to remove the tumor in your left breast.  You have  previously seen Dr. Marlou Starks.  I am happy to set this up for you.  His office, at [facility] is  [phone].  Dr. Marlou Starks would be happy to review with you having a lumpectomy.  If you do not have the  lumpectomy, there is a risk that the  tumor can grow and spread to the lymph nodes, and throughout the body.  We  will continue on Faslodex injections every 4 weeks.  You will be due for repeat  mammogram and ultrasound in May,  2020." Neri will consider meeting with Dr. Marlou Starks and will call and let me know if she decides to have surgery.  We will see her back in four weeks for labs, f/u, and her next fulvestrant.  She knows to call for any questions or concerns prior to her next appointment with [place].  A total of (20) minutes of face-to-face time was spent with this patient with greater than 50% of that time in counseling and care-coordination.    Wilber Bihari, NP  04/09/19 9:10 AM [facility] [address] [place], [place] [address] Tel. [phone]    Fax. [phone]

## 2019-04-15 ENCOUNTER — Telehealth: Payer: Self-pay | Admitting: Oncology

## 2019-04-15 NOTE — Telephone Encounter (Signed)
All appt from now to August cancelled - pt sister is aware - appt in 8/11 per Staff message from DR. Magrinat

## 2019-04-20 ENCOUNTER — Other Ambulatory Visit: Payer: Self-pay | Admitting: Internal Medicine

## 2019-04-21 ENCOUNTER — Other Ambulatory Visit: Payer: Self-pay | Admitting: Internal Medicine

## 2019-05-07 ENCOUNTER — Ambulatory Visit: Payer: Medicare Other

## 2019-05-07 ENCOUNTER — Other Ambulatory Visit: Payer: Medicare Other

## 2019-05-12 ENCOUNTER — Other Ambulatory Visit: Payer: Self-pay | Admitting: Adult Health

## 2019-05-12 DIAGNOSIS — Z853 Personal history of malignant neoplasm of breast: Secondary | ICD-10-CM

## 2019-06-18 ENCOUNTER — Encounter: Payer: Self-pay | Admitting: Internal Medicine

## 2019-06-18 ENCOUNTER — Other Ambulatory Visit: Payer: Self-pay

## 2019-06-18 ENCOUNTER — Ambulatory Visit (INDEPENDENT_AMBULATORY_CARE_PROVIDER_SITE_OTHER): Payer: Medicare Other | Admitting: Internal Medicine

## 2019-06-18 DIAGNOSIS — I69993 Ataxia following unspecified cerebrovascular disease: Secondary | ICD-10-CM

## 2019-06-18 DIAGNOSIS — F0150 Vascular dementia without behavioral disturbance: Secondary | ICD-10-CM

## 2019-06-18 DIAGNOSIS — M810 Age-related osteoporosis without current pathological fracture: Secondary | ICD-10-CM

## 2019-06-18 DIAGNOSIS — E890 Postprocedural hypothyroidism: Secondary | ICD-10-CM

## 2019-06-18 DIAGNOSIS — M1732 Unilateral post-traumatic osteoarthritis, left knee: Secondary | ICD-10-CM

## 2019-06-18 DIAGNOSIS — Z170 Estrogen receptor positive status [ER+]: Secondary | ICD-10-CM | POA: Diagnosis not present

## 2019-06-18 DIAGNOSIS — C50812 Malignant neoplasm of overlapping sites of left female breast: Secondary | ICD-10-CM | POA: Diagnosis not present

## 2019-06-18 NOTE — Progress Notes (Signed)
Patient ID: Shelia Marks, female   DOB: 09-26-1926, 85 y.o.   MRN: [id number] This service is provided via telemedicine  No vital signs collected/recorded due to the encounter was a telemedicine visit.   Location of patient (ex: home, work): home   Patient consents to a telephone visit:  yes  Location of the provider (ex: office, home):  office  Name of any referring provider:  Hollace Kinnier, DO  Names of all persons participating in the telemedicine service and their role in the encounter:  Patient, Shelia Marks, CMA, Dr. Hollace Kinnier, DO, sister, Shelia Marks  Time spent on call:  5:30    Location:  [facility] clinic Provider:  Adrienna Karis L. Mariea Clonts, D.O., C.M.D.  Code Status: DNR Goals of Care:  Advanced Directives 10/27/2018  Does Patient Have a Medical Advance Directive? Yes  Type of Advance Directive Out of facility DNR (pink MOST or yellow form)  Does patient want to make changes to medical advance directive? No - Patient declined  Copy of [place] in Chart? -  Would patient like information on creating a medical advance directive? -  Pre-existing out of facility DNR order (yellow form or pink MOST form) Yellow form placed in chart (order not valid for inpatient use)     Chief Complaint  Patient presents with  . Medical Management of Chronic Issues    52mth follow-up    HPI: Patient is a 85 y.o. female seen today for medical management of chronic diseases.    She has not been out since March.  Shelia Marks brings her meals and she eats all she can eat.  She has been to her sister's house.    She says her body is sore--her legs are weak.  She can't do much.  She does walk around slowly in her home.  No falls.  She has been drinking a whole lot of milk.  She does not drink much water.  She does drink tea, also.  No changes in the left breast.  Feels a little sting in it, but does not feel it daily.  Does not look different, but does feel lumpy vs the right one.     She has a scale but has not checked her weight lately--she can't bend over to get it.  Her knee hurts her to do that.  She thinks she is gaining weight.  She says she'll check one day this week.  She sleeps ok.  She is in good spirits.    No new concerns from her sister, Shelia Marks.    Has her breast US in July and sees oncology in August.  She's on the arimidex pills and fulvestrant supportive therapy.    Past Medical History:  Diagnosis Date  . Alzheimer's disease ([place])   . Cancer ([place])   . Disorder of bone and cartilage, unspecified   . Dizziness and giddiness   . Hyperlipidemia LDL goal < 100   . Hypertension   . Hypothyroidism   . Malignant neoplasm of thyroid gland ([place])   . Nontoxic uninodular goiter   . Other malaise and fatigue   . Other symptoms involving cardiovascular system   . Phlebitis and thrombophlebitis of unspecified site   . Senile osteoporosis   . Sleep related leg cramps   . Stroke ([place])   . Unspecified disorder of lipoid metabolism   . Unspecified hereditary and idiopathic peripheral neuropathy   . Varicose veins of lower extremities with inflammation     Past Surgical  History:  Procedure Laterality Date  . ABDOMINAL HYSTERECTOMY     DR HUGES  . BREAST BIOPSY Left 05/21/2016   malignant  . CATRACT SURGERY  2008  . COLON SURGERY    . KNEE SURGERY     right  . PARATHYROID ADENOMA REMOVED  1983  . THYROID SURGERY  July 20, 2011    No Known Allergies  Outpatient Encounter Medications as of 06/18/2019  Medication Sig  . alendronate (FOSAMAX) 70 MG tablet TAKE 1 TABLET (70 MG TOTAL) BY MOUTH EVERY 7 (SEVEN) DAYS. TAKE WITH A FULL GLASS OF WATER ON AN EMPTY STOMACH.  Marland Kitchen allopurinol (ZYLOPRIM) 300 MG tablet TAKE 1 TABLET EVERY DAY  . amLODipine (NORVASC) 10 MG tablet TAKE 1 TABLET EVERY DAY  . anastrozole (ARIMIDEX) 1 MG tablet Take 1 tablet (1 mg total) by mouth daily.  Marland Kitchen aspirin EC 81 MG tablet Take 1 tablet (81 mg total) by mouth daily.  Marland Kitchen atenolol  (TENORMIN) 25 MG tablet TAKE 1 TABLET EVERY DAY  . atorvastatin (LIPITOR) 40 MG tablet TAKE 1 TABLET EVERY DAY  . calcium carbonate (TUMS - DOSED IN MG ELEMENTAL CALCIUM) 500 MG chewable tablet Chew 1 tablet by mouth as needed for indigestion or heartburn.  . Cholecalciferol (VITAMIN D) 2000 units CAPS Take 1 capsule (2,000 Units total) by mouth daily.  . iron polysaccharides (NIFEREX) 150 MG capsule Take 1 capsule (150 mg total) by mouth every other day.  . levothyroxine (SYNTHROID, LEVOTHROID) 112 MCG tablet Take 112 mcg by mouth daily before breakfast. RX'ed Dr.Balan  . lisinopril (ZESTRIL) 2.5 MG tablet TAKE ONE TABLET  DAILY TO CONTROL BLOOD PRESSURE  . polyethylene glycol powder (GLYCOLAX/MIRALAX) powder 17 grams in 8oz of water or juice now and if no Bowel Movement by bedtime, repeat Miralax dose. If this is unsuccessful call office.  . sertraline (ZOLOFT) 50 MG tablet TAKE ONE TABLET DAILY FOR DEPRESSION   No facility-administered encounter medications on file as of 06/18/2019.     Review of Systems:  Review of Systems  Constitutional: Positive for malaise/fatigue. Negative for chills, fever and weight loss.  HENT: Positive for hearing loss.   Eyes: Negative for blurred vision.  Respiratory: Negative for cough and shortness of breath.   Cardiovascular: Negative for chest pain, palpitations, orthopnea and PND.  Musculoskeletal: Positive for joint pain and myalgias.  Skin: Negative for itching and rash.  Neurological: Negative for dizziness and loss of consciousness.  Endo/Heme/Allergies: Bruises/bleeds easily.  Psychiatric/Behavioral: Positive for memory loss. Negative for depression. The patient is not nervous/anxious and does not have insomnia.     Health Maintenance  Topic Date Due  . INFLUENZA VACCINE  08/01/2019  . TETANUS/TDAP  06/23/2028  . DEXA SCAN  Completed  . PNA vac Low Risk Adult  Completed    Physical Exam: Could not be performed as phone visit  Labs  reviewed: Basic Metabolic Panel: Recent Labs    11/13/18 1244 02/12/19 1003 03/12/19 1417  NA 146* 145 143  K 3.9 3.6 3.7  CL 108 110 108  CO2 28 26 27   GLUCOSE 84 87 80  BUN 18 16 19   CREATININE 1.02* 0.97 0.93  CALCIUM 10.2 9.8 9.3   Liver Function Tests: Recent Labs    11/13/18 1244 02/12/19 1003 03/12/19 1417  AST 29 27 31   ALT 23 23 29   ALKPHOS 72 67 61  BILITOT 0.8 0.8 0.7  PROT 7.6 7.7 7.2  ALBUMIN 3.9 3.9 3.7   No results for input(s):  LIPASE, AMYLASE in the last 8760 hours. No results for input(s): AMMONIA in the last 8760 hours. CBC: Recent Labs    11/13/18 1244 02/12/19 1003 03/12/19 1417  WBC 4.3 4.2 4.0  NEUTROABS 2.5 2.4 2.2  HGB 11.4* 11.6* 10.6*  HCT 36.7 38.0 35.0*  MCV 94.6 96.2 95.4  PLT 178 184 169   Lipid Panel: No results for input(s): CHOL, HDL, LDLCALC, TRIG, CHOLHDL, LDLDIRECT in the last 8760 hours. Lab Results  Component Value Date   HGBA1C 5.0 11/09/2015   Assessment/Plan 1. Malignant neoplasm of overlapping sites of left breast in female, estrogen receptor positive ([place]) -cont arimidex and monitor left breast -keep [place] and oncology f/u  2. Vascular dementia without behavioral disturbance ([place]) -no drastic changes lately; her sister and family provide meals and check on her regularly  3. Hypothyroidism, postsurgical -cont levothyroxine, check labs at appt next time  4. Post-traumatic osteoarthritis of left knee -ongoing pain, cont tylenol, topicals, may ice when really painful  5. Ataxia, late effect of cerebrovascular disease -cont walker use  6. Senile osteoporosis -cont fosamax, ca and vitamin D3, walking for weightbearing exercise  Labs/tests ordered:  No new Next appt:  11/03/2019 28 minutes of non face-to-face time spent.  Marice Guidone L. Stephenson Cichy, D.O. [facility] [address], [place] [address] Cell Phone (Mon-Fri 8am-5pm):  [phone] On Call:  [phone] &  follow prompts after 5pm & weekends Office Phone:  [phone] Office Fax:  [phone]

## 2019-06-19 ENCOUNTER — Other Ambulatory Visit: Payer: Self-pay | Admitting: Internal Medicine

## 2019-07-13 ENCOUNTER — Other Ambulatory Visit: Payer: Self-pay | Admitting: Internal Medicine

## 2019-07-24 ENCOUNTER — Ambulatory Visit
Admission: RE | Admit: 2019-07-24 | Discharge: 2019-07-24 | Disposition: A | Discharge status: Home / Self Care | Payer: Medicare Other | Source: Ambulatory Visit | Attending: Adult Health | Admitting: Adult Health

## 2019-07-24 ENCOUNTER — Other Ambulatory Visit: Payer: Self-pay

## 2019-07-24 ENCOUNTER — Other Ambulatory Visit: Payer: Self-pay | Admitting: Adult Health

## 2019-07-24 DIAGNOSIS — Z853 Personal history of malignant neoplasm of breast: Secondary | ICD-10-CM

## 2019-07-24 DIAGNOSIS — R921 Mammographic calcification found on diagnostic imaging of breast: Secondary | ICD-10-CM | POA: Diagnosis not present

## 2019-07-24 DIAGNOSIS — N6321 Unspecified lump in the left breast, upper outer quadrant: Secondary | ICD-10-CM | POA: Diagnosis not present

## 2019-07-27 ENCOUNTER — Other Ambulatory Visit: Payer: Self-pay | Admitting: Oncology

## 2019-07-27 NOTE — Progress Notes (Signed)
I called Ms. Shelia Marks but was not able to get through.  I called her healthcare power of attorney, her Sister [name].  We discussed the fact that there has been evidence of growth.  At this point either the patient has surgery or she goes back to Faslodex.  Kei is scheduled to see me 08/11/2019 with a shot that day.  We will discuss that further at that point.

## 2019-07-31 ENCOUNTER — Other Ambulatory Visit: Payer: Self-pay | Admitting: Internal Medicine

## 2019-08-10 NOTE — Progress Notes (Signed)
[place]  [phone] [phone]    ID: Shelia Marks DOB: 03-17-1926  MR#: [id number]  HFW#:[id number]  Patient Care Team: Gayland Curry, DO as PCP - General (Geriatric Medicine) Rutherford Guys, MD as Consulting Physician (Ophthalmology) Athaliah Baumbach, Virgie Dad, MD as Consulting Physician (Oncology) Jovita Kussmaul, MD as Consulting Physician (General Surgery) Jacelyn Pi, MD as Consulting Physician (Endocrinology) OTHER MD:   CHIEF COMPLAINT: Estrogen receptor positive breast cancer  CURRENT TREATMENT: Awaiting definitive surgery   INTERVAL HISTORY: Shelia Marks returns today for follow-up and treatment of her estrogen receptor positive breast cancer.   She continues on fulvestrant. She states these do not hurt. She denies any problems from this.  Since her last visit, she underwent bilateral diagnostic mammography with tomography and left breast ultrasound at [facility] on 07/24/2019 showing: breast density category C; interval increase in size of the previously demonstrated 3 masses compatible with probable invasive malignancy in the upper-outer quadrant of the left breast; interval increase in size of at least 3 left axillary lymph nodes including one with marked cortical thickening, compatible with metastatic nodes; interval decrease in span of the previously demonstrated malignant-appearing calcifications in the upper-outer left breast compatible with interval tissue contraction in that area; interval diffuse left breast skin thickening and interstitial thickening compatible with lymphangitic spread of tumor; no evidence of malignancy on the right.   REVIEW OF SYSTEMS: Shelia Marks reports she feels 85 years old. She states her right arm bothers her. Her sister does the shopping because she is unable to do anything. She notes she lives by herself. She reports she gets tired easily, she she still does some housework. She reports a bit of a  cough and attributes this to one of her blood pressure medications. A detailed review of systems was otherwise entirely negative.   BREAST CANCER HISTORY: From the original intake note:  "Shelia Marks" had bilateral screening mammography at the [facility] 06/07/2016 showing calcifications in the left breast. She was recalled  05/15/2016 for left diagnostic mammography. This showed the breast density to be category be. In the upper outer left breast there was an area of suspicious calcifications measuring 1.7 cm.  Biopsy of this area was obtained 05/21/2016, and showed (S [id number]) ductal carcinoma in situ, high-grade, estrogen receptor 100% positive, progesterone receptor 5% positive, WITH strong staining intensity.  Her subsequent history is as detailed below.   PAST MEDICAL HISTORY: Past Medical History:  Diagnosis Date   Alzheimer's disease ([place])    Cancer ([place])    Disorder of bone and cartilage, unspecified    Dizziness and giddiness    Hyperlipidemia LDL goal < 100    Hypertension    Hypothyroidism    Malignant neoplasm of thyroid gland ([facility])    Nontoxic uninodular goiter    Other malaise and fatigue    Other symptoms involving cardiovascular system    Phlebitis and thrombophlebitis of unspecified site    Senile osteoporosis    Sleep related leg cramps    Stroke ([place])    Unspecified disorder of lipoid metabolism    Unspecified hereditary and idiopathic peripheral neuropathy    Varicose veins of lower extremities with inflammation     PAST SURGICAL HISTORY: Past Surgical History:  Procedure Laterality Date   ABDOMINAL HYSTERECTOMY     DR HUGES   BREAST BIOPSY Left 05/21/2016   malignant   CATRACT SURGERY  2008   COLON SURGERY     KNEE SURGERY     right  PARATHYROID ADENOMA REMOVED  1983   THYROID SURGERY  July 20, 2011    FAMILY HISTORY Family History  Problem Relation Age of Onset   Diabetes Brother    The patient's father died  from liver problems in his 19s. The patient's mother died in her 48s from "natural causes". The patient had no brothers. She had 6 sisters, 2 of whom died from complications of diabetes and one from a ruptured aneurysm. There is no history of breast or ovarian cancer in the family.    GYNECOLOGIC HISTORY:  No LMP recorded. Patient has had a hysterectomy.  Shelia Marks does not remember how she was when she had her first period. She never carried a child to term. She is status post hysterectomy and at least one ovary was removed. She never took hormone replacement.    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    ADVANCED DIRECTIVES: [name], Bryon Lions, is her healthcare power of attorney. [name] can be reached at [phone].   HEALTH MAINTENANCE: Social History   Tobacco Use   Smoking status: Never Smoker   Smokeless tobacco: Never Used  Substance Use Topics   Alcohol use: No   Drug use: No     No Known Allergies  Current Outpatient Medications  Medication Sig Dispense Refill   alendronate (FOSAMAX) 70 MG tablet TAKE 1 TABLET (70 MG TOTAL) BY MOUTH EVERY 7 (SEVEN) DAYS. TAKE WITH A FULL GLASS OF WATER ON AN EMPTY STOMACH. 12 tablet 1   allopurinol (ZYLOPRIM) 300 MG tablet TAKE 1 TABLET EVERY DAY 90 tablet 1   amLODipine (NORVASC) 10 MG tablet TAKE 1 TABLET EVERY DAY 90 tablet 1   anastrozole (ARIMIDEX) 1 MG tablet Take 1 tablet (1 mg total) by mouth daily. 90 tablet 4   aspirin EC 81 MG tablet Take 1 tablet (81 mg total) by mouth daily. 30 tablet 3   atenolol (TENORMIN) 25 MG tablet TAKE 1 TABLET EVERY DAY 90 tablet 1   atorvastatin (LIPITOR) 40 MG tablet TAKE 1 TABLET EVERY DAY 90 tablet 1   calcium carbonate (TUMS - DOSED IN MG ELEMENTAL CALCIUM) 500 MG chewable tablet Chew 1 tablet by mouth as needed for indigestion or heartburn.     Cholecalciferol (VITAMIN D)  2000 units CAPS Take 1 capsule (2,000 Units total) by mouth daily. 30 capsule 2   iron polysaccharides (NIFEREX) 150 MG capsule Take 1 capsule (150 mg total) by mouth every other day. 15 capsule 3   levothyroxine (SYNTHROID, LEVOTHROID) 112 MCG tablet Take 112 mcg by mouth daily before breakfast. RX'ed Dr.Balan     lisinopril (ZESTRIL) 2.5 MG tablet TAKE ONE TABLET  DAILY TO CONTROL BLOOD PRESSURE 90 tablet 1   polyethylene glycol powder (GLYCOLAX/MIRALAX) powder 17 grams in 8oz of water or juice now and if no Bowel Movement by bedtime, repeat Miralax dose. If this is unsuccessful call office. 119 g 0   sertraline (ZOLOFT) 50 MG tablet TAKE ONE TABLET DAILY FOR DEPRESSION 90 tablet 1   No current facility-administered medications for this visit.     OBJECTIVE: Older African-American woman examined in a wheelchair wheelchair Vitals:   08/11/19 1113  BP: (!) 159/67  Pulse: 71  Resp: 20  Temp: (!) 97 F (36.1 C)  SpO2: 100%     Body mass index is 30.04 kg/m.    ECOG FS:2 - Symptomatic, <50%  confined to bed  Sclerae unicteric, EOMs intact Wearing a mask No cervical or supraclavicular adenopathy Lungs no rales or rhonchi Heart regular rate and rhythm Abd soft, nontender, positive bowel sounds MSK no focal spinal tenderness, no upper extremity lymphedema Neuro: nonfocal, well oriented, appropriate affect Breasts: I do not palpate a well-defined mass in the left breast but I do palpate a left axillary lymph node, which is not tender and soft/rubbery.  I do not see evidence of inflammatory breast cancer transformation.  The left breast is imaged below   Left Breast 08/11/2019    LAB RESULTS:  CMP     Component Value Date/Time   NA 143 03/12/2019 1417   NA 143 06/01/2016 0958   K 3.7 03/12/2019 1417   CL 108 03/12/2019 1417   CO2 27 03/12/2019 1417   GLUCOSE 80 03/12/2019 1417   BUN 19 03/12/2019 1417   BUN 21 06/01/2016 0958   CREATININE 0.93 03/12/2019 1417    CREATININE 0.99 (H) 02/25/2018 0937   CALCIUM 9.3 03/12/2019 1417   PROT 7.2 03/12/2019 1417   PROT 6.8 11/09/2015 1013   ALBUMIN 3.7 03/12/2019 1417   ALBUMIN 4.1 11/09/2015 1013   AST 31 03/12/2019 1417   ALT 29 03/12/2019 1417   ALKPHOS 61 03/12/2019 1417   BILITOT 0.7 03/12/2019 1417   BILITOT 0.5 11/09/2015 1013   GFRNONAA 54 (L) 03/12/2019 1417   GFRNONAA 51 (L) 02/25/2018 0937   GFRAA >60 03/12/2019 1417   GFRAA 59 (L) 02/25/2018 0937    INo results found for: SPEP, UPEP  Lab Results  Component Value Date   WBC 4.0 08/11/2019   NEUTROABS 2.4 08/11/2019   HGB 11.4 (L) 08/11/2019   HCT 35.9 (L) 08/11/2019   MCV 94.2 08/11/2019   PLT 184 08/11/2019      Chemistry      Component Value Date/Time   NA 143 03/12/2019 1417   NA 143 06/01/2016 0958   K 3.7 03/12/2019 1417   CL 108 03/12/2019 1417   CO2 27 03/12/2019 1417   BUN 19 03/12/2019 1417   BUN 21 06/01/2016 0958   CREATININE 0.93 03/12/2019 1417   CREATININE 0.99 (H) 02/25/2018 0937      Component Value Date/Time   CALCIUM 9.3 03/12/2019 1417   ALKPHOS 61 03/12/2019 1417   AST 31 03/12/2019 1417   ALT 29 03/12/2019 1417   BILITOT 0.7 03/12/2019 1417   BILITOT 0.5 11/09/2015 1013       No results found for: LABCA2  No components found for: LABCA125  No results for input(s): INR in the last 168 hours.  Urinalysis    Component Value Date/Time   COLORURINE STRAW (A) 03/09/2016 2001   APPEARANCEUR CLEAR 03/09/2016 2001   LABSPEC 1.006 03/09/2016 2001   PHURINE 7.5 03/09/2016 2001   GLUCOSEU NEGATIVE 03/09/2016 2001   HGBUR NEGATIVE 03/09/2016 2001   [place] NEGATIVE 03/09/2016 2001   [place] 03/09/2016 2001   PROTEINUR NEGATIVE 03/09/2016 2001   UROBILINOGEN 0.2 03/05/2014 2055   NITRITE NEGATIVE 03/09/2016 2001   LEUKOCYTESUR NEGATIVE 03/09/2016 2001    STUDIES: US Breast Ltd Uni Left Inc Axilla  Result Date: 07/24/2019 CLINICAL DATA:  Ductal carcinoma in situ in the  upper-outer left breast diagnosed in 2017. The patient has taken anastrozole and letrozole and is currently taking fulvestrant.She has not had surgery or radiation therapy. EXAM: DIGITAL DIAGNOSTIC BILATERAL MAMMOGRAM WITH CAD AND TOMO ULTRASOUND LEFT BREAST COMPARISON:  Previous examinations, the most recent dated  11/04/2018. ACR Breast Density Category c: The breast tissue is heterogeneously dense, which may obscure small masses. FINDINGS: Interval diffuse skin and interstitial thickening in the left breast. Interval increase in size of left axillary lymph nodes. The previously demonstrated 7.9 x 7.7 x 4.4 cm group of segmental calcifications in the upper-outer quadrant of the left breast currently spans 6.5 x 4.2 x 3.1 cm maximum dimensions. Areas of increased density and architectural distortion within these calcifications appear larger. No findings on the right suspicious for malignancy. Mammographic images were processed with CAD. On physical exam, the patient has an approximately 4 cm rounded, firm, palpable area of mass-like soft tissue thickening in the upper-outer quadrant of the left breast. No visible or palpable skin thickening or redness. Targeted ultrasound is performed, showing a 3.8 x 3.6 x 2.4 cm irregular, heterogeneous, hypoechoic mass in the 1 o'clock position of the left breast. This measured 2.7 x 2.5 x 1.4 cm on 11/04/2018. In the 2 o'clock position of the left breast, 5 cm from the nipple, a 1.2 x 1.1 x 1.0 cm similar-appearing mass is demonstrated. This was previously in the 3 o'clock position, 3 cm from the nipple and measured 0.8 x 0.6 x 0.4 cm on 11/04/2018. In the 2 o'clock position of the left breast, 8 cm from the nipple, a 1.3 x 0.8 x 0.8 cm similar-appearing mass is demonstrated. This previously measured 0.7 x 0.7 x 0.5 cm. Ultrasound of the left axilla demonstrates a marked increase in degree of focal, hypoechoic cortical thickening involving a left axillary node that previously  had a maximum cortical thickness of 6 mm. This currently has a maximum cortical thickness of 14 mm. There are at least 2 additional smaller left axillary lymph nodes with interval mild cortical thickening. IMPRESSION: 1. Interval increase in size of the previously demonstrated 3 masses compatible with probable invasive malignancy in the upper-outer quadrant of the left breast. 2. Interval increase in size of at least 3 left axillary lymph nodes including one with marked cortical thickening, compatible with metastatic nodes. 3. Interval decrease in span of the previously demonstrated malignant-appearing calcifications in the upper-outer left breast compatible with interval tissue contraction in that area. 4. Interval diffuse left breast skin thickening and interstitial thickening compatible with lymphangitic spread of tumor. 5. No evidence of malignancy on the right. RECOMMENDATION: Treatment plan. I have discussed the findings and recommendations with the patient. Results were also provided in writing at the conclusion of the visit. If applicable, a reminder letter will be sent to the patient regarding the next appointment. BI-RADS CATEGORY  6: Known biopsy-proven malignancy. Electronically Signed   By: Claudie Revering M.D.   On: 07/24/2019 11:46   Mm Diag Breast Tomo Bilateral  Result Date: 07/24/2019 CLINICAL DATA:  Ductal carcinoma in situ in the upper-outer left breast diagnosed in 2017. The patient has taken anastrozole and letrozole and is currently taking fulvestrant.She has not had surgery or radiation therapy. EXAM: DIGITAL DIAGNOSTIC BILATERAL MAMMOGRAM WITH CAD AND TOMO ULTRASOUND LEFT BREAST COMPARISON:  Previous examinations, the most recent dated 11/04/2018. ACR Breast Density Category c: The breast tissue is heterogeneously dense, which may obscure small masses. FINDINGS: Interval diffuse skin and interstitial thickening in the left breast. Interval increase in size of left axillary lymph nodes. The  previously demonstrated 7.9 x 7.7 x 4.4 cm group of segmental calcifications in the upper-outer quadrant of the left breast currently spans 6.5 x 4.2 x 3.1 cm maximum dimensions. Areas of increased density and  architectural distortion within these calcifications appear larger. No findings on the right suspicious for malignancy. Mammographic images were processed with CAD. On physical exam, the patient has an approximately 4 cm rounded, firm, palpable area of mass-like soft tissue thickening in the upper-outer quadrant of the left breast. No visible or palpable skin thickening or redness. Targeted ultrasound is performed, showing a 3.8 x 3.6 x 2.4 cm irregular, heterogeneous, hypoechoic mass in the 1 o'clock position of the left breast. This measured 2.7 x 2.5 x 1.4 cm on 11/04/2018. In the 2 o'clock position of the left breast, 5 cm from the nipple, a 1.2 x 1.1 x 1.0 cm similar-appearing mass is demonstrated. This was previously in the 3 o'clock position, 3 cm from the nipple and measured 0.8 x 0.6 x 0.4 cm on 11/04/2018. In the 2 o'clock position of the left breast, 8 cm from the nipple, a 1.3 x 0.8 x 0.8 cm similar-appearing mass is demonstrated. This previously measured 0.7 x 0.7 x 0.5 cm. Ultrasound of the left axilla demonstrates a marked increase in degree of focal, hypoechoic cortical thickening involving a left axillary node that previously had a maximum cortical thickness of 6 mm. This currently has a maximum cortical thickness of 14 mm. There are at least 2 additional smaller left axillary lymph nodes with interval mild cortical thickening. IMPRESSION: 1. Interval increase in size of the previously demonstrated 3 masses compatible with probable invasive malignancy in the upper-outer quadrant of the left breast. 2. Interval increase in size of at least 3 left axillary lymph nodes including one with marked cortical thickening, compatible with metastatic nodes. 3. Interval decrease in span of the previously  demonstrated malignant-appearing calcifications in the upper-outer left breast compatible with interval tissue contraction in that area. 4. Interval diffuse left breast skin thickening and interstitial thickening compatible with lymphangitic spread of tumor. 5. No evidence of malignancy on the right. RECOMMENDATION: Treatment plan. I have discussed the findings and recommendations with the patient. Results were also provided in writing at the conclusion of the visit. If applicable, a reminder letter will be sent to the patient regarding the next appointment. BI-RADS CATEGORY  6: Known biopsy-proven malignancy. Electronically Signed   By: Claudie Revering M.D.   On: 07/24/2019 11:46     ELIGIBLE FOR AVAILABLE RESEARCH PROTOCOL: no   ASSESSMENT: 85 y.o. Protection woman status post left breast upper outer quadrant biopsy 05/21/2016 for ductal carcinoma in situ, high-grade, estrogen and progesterone receptor positive  (1) opted against surgery   (2) started anastrozole neoadjuvantly 06/13/2016, discontinued June 2018 due to osteoporosis concerns  (a)  bone density 11/16/2016 shows a T score of -4.7 (osteoporosis)-on fosamax.   (3) mammography/ultrasonography 06/10/2018 shows evidence of disease progression, possible lymph node extension  (4) letrozole started 06/27/2018   (a) discontinued November 2019 with evidence of progression  (5) fulvestrant started 11/21/2018  (a) mammography and ultrasonography 07/24/2019 shows evidence of disease progression  (6) definitive surgery pending   PLAN: Shelia Marks breast cancer has grown through 3 different types of antiestrogen.  I think if we do not proceed to surgery it is going to get out of hand.  Indeed we already have evidence radiologically of spread to the left axilla.  I have sent her surgeon Dr. Marlou Starks and note with this information.  I am trying to contact the patient's sister today to give her a copy of the ultrasound and mammogram report from  07/24/2019 and to let them know to expect a call  from the patient's surgeon.  At this point we are discontinuing the fulvestrant.  We will reassess systemic treatment once we get the final pathology report.  I am putting in a repeat visit in about 6 weeks but of course we will be glad to see Shelia Marks before that if necessary   Sarajane Jews C. Amun Stemm, MD  08/11/19 11:27 AM Medical Oncology and Hematology [facility] [address] [place], [place] [address] Tel. [phone]    Fax. [phone]   I, Wilburn Mylar, am acting as scribe for Dr. Virgie Dad. Ragna Kramlich.  I, Lurline Del MD, have reviewed the above documentation for accuracy and completeness, and I agree with the above.

## 2019-08-11 ENCOUNTER — Inpatient Hospital Stay (HOSPITAL_BASED_OUTPATIENT_CLINIC_OR_DEPARTMENT_OTHER): Payer: Medicare Other | Admitting: Oncology

## 2019-08-11 ENCOUNTER — Inpatient Hospital Stay: Payer: Medicare Other | Attending: Oncology

## 2019-08-11 ENCOUNTER — Other Ambulatory Visit: Payer: Self-pay

## 2019-08-11 ENCOUNTER — Inpatient Hospital Stay: Payer: Medicare Other

## 2019-08-11 VITALS — BP 159/67 | HR 71 | Temp 97.0°F | Resp 20 | Ht 61.0 in | Wt 159.0 lb

## 2019-08-11 DIAGNOSIS — Z170 Estrogen receptor positive status [ER+]: Secondary | ICD-10-CM

## 2019-08-11 DIAGNOSIS — C50812 Malignant neoplasm of overlapping sites of left female breast: Secondary | ICD-10-CM

## 2019-08-11 DIAGNOSIS — D0512 Intraductal carcinoma in situ of left breast: Secondary | ICD-10-CM | POA: Diagnosis not present

## 2019-08-11 LAB — CMP (CANCER CENTER ONLY)
ALT: 27 U/L (ref 0–44)
AST: 29 U/L (ref 15–41)
Albumin: 4 g/dL (ref 3.5–5.0)
Alkaline Phosphatase: 52 U/L (ref 38–126)
Anion gap: 6 (ref 5–15)
BUN: 22 mg/dL (ref 8–23)
CO2: 28 mmol/L (ref 22–32)
Calcium: 9.6 mg/dL (ref 8.9–10.3)
Chloride: 109 mmol/L (ref 98–111)
Creatinine: 1.1 mg/dL — ABNORMAL HIGH (ref 0.44–1.00)
GFR, Est AFR Am: 51 mL/min — ABNORMAL LOW (ref >60)
GFR, Estimated: 44 mL/min — ABNORMAL LOW (ref >60)
Glucose, Bld: 101 mg/dL — ABNORMAL HIGH (ref 70–99)
Potassium: 3.5 mmol/L (ref 3.5–5.1)
Sodium: 143 mmol/L (ref 135–145)
Total Bilirubin: 0.8 mg/dL (ref 0.3–1.2)
Total Protein: 7.5 g/dL (ref 6.5–8.1)

## 2019-08-11 LAB — CBC WITH DIFFERENTIAL/PLATELET
Abs Immature Granulocytes: 0.02 10*3/uL (ref 0.00–0.07)
Basophils Absolute: 0 10*3/uL (ref 0.0–0.1)
Basophils Relative: 1 %
Eosinophils Absolute: 0.1 10*3/uL (ref 0.0–0.5)
Eosinophils Relative: 2 %
HCT: 35.9 % — ABNORMAL LOW (ref 36.0–46.0)
Hemoglobin: 11.4 g/dL — ABNORMAL LOW (ref 12.0–15.0)
Immature Granulocytes: 1 %
Lymphocytes Relative: 28 %
Lymphs Abs: 1.1 10*3/uL (ref 0.7–4.0)
MCH: 29.9 pg (ref 26.0–34.0)
MCHC: 31.8 g/dL (ref 30.0–36.0)
MCV: 94.2 fL (ref 80.0–100.0)
Monocytes Absolute: 0.3 10*3/uL (ref 0.1–1.0)
Monocytes Relative: 8 %
Neutro Abs: 2.4 10*3/uL (ref 1.7–7.7)
Neutrophils Relative %: 60 %
Platelets: 184 10*3/uL (ref 150–400)
RBC: 3.81 MIL/uL — ABNORMAL LOW (ref 3.87–5.11)
RDW: 15.6 % — ABNORMAL HIGH (ref 11.5–15.5)
WBC: 4 10*3/uL (ref 4.0–10.5)
nRBC: 0 % (ref 0.0–0.2)

## 2019-08-12 ENCOUNTER — Telehealth: Payer: Self-pay | Admitting: Oncology

## 2019-08-12 NOTE — Telephone Encounter (Signed)
I talk with patient regarding schedule  

## 2019-08-14 ENCOUNTER — Other Ambulatory Visit: Payer: Self-pay | Admitting: Oncology

## 2019-08-15 ENCOUNTER — Other Ambulatory Visit: Payer: Self-pay | Admitting: Internal Medicine

## 2019-08-17 ENCOUNTER — Ambulatory Visit: Payer: Self-pay | Admitting: General Surgery

## 2019-08-17 DIAGNOSIS — D0512 Intraductal carcinoma in situ of left breast: Secondary | ICD-10-CM | POA: Diagnosis not present

## 2019-08-24 ENCOUNTER — Other Ambulatory Visit: Payer: Self-pay | Admitting: Internal Medicine

## 2019-08-25 ENCOUNTER — Ambulatory Visit (HOSPITAL_COMMUNITY)
Admission: RE | Admit: 2019-08-25 | Discharge: 2019-08-25 | Disposition: A | Discharge status: Home / Self Care | Payer: Medicare Other | Source: Ambulatory Visit | Attending: Oncology | Admitting: Oncology

## 2019-08-25 ENCOUNTER — Other Ambulatory Visit: Payer: Self-pay

## 2019-08-25 DIAGNOSIS — C50812 Malignant neoplasm of overlapping sites of left female breast: Secondary | ICD-10-CM | POA: Diagnosis not present

## 2019-08-25 DIAGNOSIS — C50912 Malignant neoplasm of unspecified site of left female breast: Secondary | ICD-10-CM | POA: Diagnosis not present

## 2019-08-25 DIAGNOSIS — Z170 Estrogen receptor positive status [ER+]: Secondary | ICD-10-CM | POA: Insufficient documentation

## 2019-08-25 DIAGNOSIS — Z5111 Encounter for antineoplastic chemotherapy: Secondary | ICD-10-CM | POA: Diagnosis not present

## 2019-08-25 MED ORDER — IOHEXOL 300 MG/ML  SOLN
75.0000 mL | Freq: Once | INTRAMUSCULAR | Status: AC | PRN
  Administered 2019-08-25: 75 mL via INTRAVENOUS

## 2019-08-25 MED ORDER — SODIUM CHLORIDE (PF) 0.9 % IJ SOLN
INTRAMUSCULAR | Status: AC
  Filled 2019-08-25: qty 50

## 2019-08-26 ENCOUNTER — Telehealth: Payer: Self-pay | Admitting: *Deleted

## 2019-08-26 MED ORDER — HYDROXYZINE HCL 10 MG PO TABS: 10.0000 mg | ORAL_TABLET | Freq: Three times a day (TID) | ORAL | 0 refills | Status: DC | PRN

## 2019-08-26 NOTE — Telephone Encounter (Signed)
This RN received call from pt's caregiver, Bryon Lions, stating pt is " getting a little anxious as her surgical date is approaching and wondering if she could have something to help with the jitters "  Per MD obtained prescription vistaril.   Above medication discussed with [name] with verbal understanding of use. Of note pt will also stop her " cancer pill " anastrazole now due to pending surgery.  No further needs at this time.

## 2019-08-27 ENCOUNTER — Other Ambulatory Visit: Payer: Self-pay | Admitting: Oncology

## 2019-08-28 ENCOUNTER — Other Ambulatory Visit: Payer: Self-pay | Admitting: Internal Medicine

## 2019-08-31 ENCOUNTER — Other Ambulatory Visit: Payer: Self-pay | Admitting: Oncology

## 2019-09-02 ENCOUNTER — Other Ambulatory Visit: Payer: Self-pay

## 2019-09-02 ENCOUNTER — Encounter (HOSPITAL_COMMUNITY)
Admission: RE | Admit: 2019-09-02 | Discharge: 2019-09-02 | Disposition: A | Discharge status: Home / Self Care | Payer: Medicare Other | Source: Ambulatory Visit | Attending: General Surgery | Admitting: General Surgery

## 2019-09-02 ENCOUNTER — Encounter (HOSPITAL_COMMUNITY): Payer: Self-pay

## 2019-09-02 DIAGNOSIS — Z01818 Encounter for other preprocedural examination: Secondary | ICD-10-CM | POA: Insufficient documentation

## 2019-09-02 DIAGNOSIS — R9431 Abnormal electrocardiogram [ECG] [EKG]: Secondary | ICD-10-CM | POA: Insufficient documentation

## 2019-09-02 DIAGNOSIS — C50912 Malignant neoplasm of unspecified site of left female breast: Secondary | ICD-10-CM | POA: Diagnosis not present

## 2019-09-02 DIAGNOSIS — I10 Essential (primary) hypertension: Secondary | ICD-10-CM | POA: Insufficient documentation

## 2019-09-02 DIAGNOSIS — I4510 Unspecified right bundle-branch block: Secondary | ICD-10-CM | POA: Diagnosis not present

## 2019-09-02 LAB — BASIC METABOLIC PANEL
Anion gap: 11 (ref 5–15)
BUN: 17 mg/dL (ref 8–23)
CO2: 24 mmol/L (ref 22–32)
Calcium: 9.7 mg/dL (ref 8.9–10.3)
Chloride: 106 mmol/L (ref 98–111)
Creatinine, Ser: 1.06 mg/dL — ABNORMAL HIGH (ref 0.44–1.00)
GFR calc Af Amer: 54 mL/min — ABNORMAL LOW (ref >60)
GFR calc non Af Amer: 46 mL/min — ABNORMAL LOW (ref >60)
Glucose, Bld: 86 mg/dL (ref 70–99)
Potassium: 4.3 mmol/L (ref 3.5–5.1)
Sodium: 141 mmol/L (ref 135–145)

## 2019-09-02 NOTE — Progress Notes (Signed)
[id number] [address], [place] [address] Phone: [phone] Fax: [phone]  [facility] [place], [place] [address] Phone: [phone] Fax: [phone]      Your procedure is scheduled on September 10, 2019.  Report to [facility] Main Entrance "A" at 08:30 A.M., and check in at the Admitting office.  Call this number if you have problems the morning of surgery:  [phone]  Call [phone] if you have any questions prior to your surgery date Monday-Friday 8am-4pm    Remember:  Do not eat  after midnight the night before your surgery  You may drink clear liquids until 07:30am the morning of your surgery.   Clear liquids allowed are: Water, Non-Citrus Juices (without pulp), Carbonated Beverages, Clear Tea, Black Coffee Only, and Gatorade    Take these medicines the morning of surgery with A SIP OF WATER : Atenolol (Tenormin) Hydroxyzine (Atarax) if needed Levothyroxine (Synthroid) Sertraline (Zoloft)  7 days prior to surgery STOP taking any Aspirin (unless otherwise instructed by your surgeon), Aleve, Naproxen, Ibuprofen, Motrin, Advil, Goody's, BC's, all herbal medications, fish oil, and all vitamins.    The Morning of Surgery  Do not wear jewelry, make-up or nail polish.  Do not wear lotions, powders, or perfumes/colognes, or deodorant  Do not shave 48 hours prior to surgery.  Do not bring valuables to the hospital.  [facility] is not responsible for any belongings or valuables.  If you are a smoker, DO NOT Smoke 24 hours prior to surgery IF you wear a CPAP at night please bring your mask, tubing, and machine the morning of surgery   Remember that you must have someone to transport you home after your surgery, and remain with you for 24 hours if you are discharged the same day.   Contacts, glasses, hearing aids, dentures or  bridgework may not be worn into surgery.    Leave your suitcase in the car.  After surgery it may be brought to your room.  For patients admitted to the hospital, discharge time will be determined by your treatment team.  Patients discharged the day of surgery will not be allowed to drive home.    Special instructions:   [place]- Preparing For Surgery  Before surgery, you can play an important role. Because skin is not sterile, your skin needs to be as free of germs as possible. You can reduce the number of germs on your skin by washing with CHG (chlorahexidine gluconate) Soap before surgery.  CHG is an antiseptic cleaner which kills germs and bonds with the skin to continue killing germs even after washing.    Oral Hygiene is also important to reduce your risk of infection.  Remember - BRUSH YOUR TEETH THE MORNING OF SURGERY WITH YOUR REGULAR TOOTHPASTE  Please do not use if you have an allergy to CHG or antibacterial soaps. If your skin becomes reddened/irritated stop using the CHG.  Do not shave (including legs and underarms) for at least 48 hours prior to first CHG shower. It is OK to shave your face.  Please follow these instructions carefully.   1. Shower the NIGHT BEFORE SURGERY and the MORNING OF SURGERY with CHG Soap.   2. If you chose to wash your hair, wash your hair first as usual with your normal shampoo.  3. After you shampoo, rinse your hair and body thoroughly to  remove the shampoo.  4. Use CHG as you would any other liquid soap. You can apply CHG directly to the skin and wash gently with a scrungie or a clean washcloth.   5. Apply the CHG Soap to your body ONLY FROM THE NECK DOWN.  Do not use on open wounds or open sores. Avoid contact with your eyes, ears, mouth and genitals (private parts). Wash Face and genitals (private parts)  with your normal soap.   6. Wash thoroughly, paying special attention to the area where your surgery will be  performed.  7. Thoroughly rinse your body with warm water from the neck down.  8. DO NOT shower/wash with your normal soap after using and rinsing off the CHG Soap.  9. Pat yourself dry with a CLEAN TOWEL.  10. Wear CLEAN PAJAMAS to bed the night before surgery, wear comfortable clothes the morning of surgery  11. Place CLEAN SHEETS on your bed the night of your first shower and DO NOT SLEEP WITH PETS.    Day of Surgery:  Do not apply any deodorants/lotions. Please shower the morning of surgery with the CHG soap  Please wear clean clothes to the hospital/surgery center.   Remember to brush your teeth WITH YOUR REGULAR TOOTHPASTE.   Please read over the following fact sheets that you were given.

## 2019-09-02 NOTE — Progress Notes (Signed)
Lab called.  Unable to run CBC due to insufficient sample.  Will need to run CBC DOS.

## 2019-09-02 NOTE — Progress Notes (Signed)
PCP: Dr. Hollace Kinnier Cardiologist: Denies  EKG: Today CXR: n/a [id number] Stress Test: denies Cardiac Cath: denies  Pt A&O x4 at PAT visit, able to sign own consent.  Does report some memory recall issues.  Sister, [name], at appt to help remember surgical instructions.  Pt has difficulty taking full showers, but sister reports she will assist with CHG shower the night before surgery, but will probably be unable to do AM shower.  If unable, will provide 6 cloth CHG baths.    Patient denies shortness of breath, fever, cough, and chest pain at PAT appointment.  Patient verbalized understanding of instructions provided today at the PAT appointment.  Patient asked to review instructions at home and day of surgery.

## 2019-09-03 ENCOUNTER — Encounter: Payer: Self-pay | Admitting: Internal Medicine

## 2019-09-03 ENCOUNTER — Ambulatory Visit (INDEPENDENT_AMBULATORY_CARE_PROVIDER_SITE_OTHER): Payer: Medicare Other | Admitting: Internal Medicine

## 2019-09-03 VITALS — BP 140/70 | HR 68 | Temp 97.7°F | Ht 62.0 in | Wt 157.0 lb

## 2019-09-03 DIAGNOSIS — F0150 Vascular dementia without behavioral disturbance: Secondary | ICD-10-CM | POA: Diagnosis not present

## 2019-09-03 DIAGNOSIS — Z23 Encounter for immunization: Secondary | ICD-10-CM | POA: Diagnosis not present

## 2019-09-03 DIAGNOSIS — Z7189 Other specified counseling: Secondary | ICD-10-CM

## 2019-09-03 DIAGNOSIS — C50812 Malignant neoplasm of overlapping sites of left female breast: Secondary | ICD-10-CM

## 2019-09-03 DIAGNOSIS — Z170 Estrogen receptor positive status [ER+]: Secondary | ICD-10-CM

## 2019-09-03 NOTE — Progress Notes (Signed)
Location:  [facility] clinic Provider: Anastaisa Wooding L. Mariea Clonts, D.O., C.M.D.  Code Status: DNR Goals of Care:  Advanced Directives 09/02/2019  Does Patient Have a Medical Advance Directive? -  Type of Advance Directive -  Does patient want to make changes to medical advance directive? -  Copy of [place] in Chart? -  Would patient like information on creating a medical advance directive? No - Patient declined  Pre-existing out of facility DNR order (yellow form or pink MOST form) -     Chief Complaint  Patient presents with  . Medical Management of Chronic Issues    discuss breast cancer    HPI: Patient is a 85 y.o. female seen today for an acute visit to discuss her breast cancer and goals of care.  Caoimhe has tried three different medications to help decrease the growth of her breast tumor, but has either not tolerated it or the cancer has not responded to the treatment and continued to grow.  Dr. Jana Hakim has now recommended she proceed with surgery and she's already had preop testing done.  Dr. Marlou Starks is to be doing a modified left radical mastectomy on 09/10/19.  Jashya still seems to have mixed feeling about her decision given her age.  She is quite anxious and tearful today.  She has decided to have the surgery.  Her sister really does not want to participate at all in the discussion.  She wants Rexie to make all decisions herself, but she is so anxious right now that it's challenging.  Of note, Brooklynn is now staying with her sister not alone as she'd been which is fortunate.  We discussed her code status:  She affirms that she would not want to be resuscitated if she had a cardiopulmonary arrest and that she would not want to be on a ventilator.  She is also able to say that she would not want a feeding tube if she was unable to eat for herself.  I attempted to discuss limited additional interventions and comfort care, but she was unable to understand well and make a  decision there about future hospitalizations beyond her surgery.  [name] would not help her to decide.  She also could not decide about fluids.  We left these blank on the MOST.  We also spoke after her postop risks.  We discussed delirium and how anesthesia, pain medications, numerous tubes/IVs/catheters, pain itself, changes in environment and her baseline cognitive losses all put her at risk for this condition.  It can take weeks to months to improve at times and sometimes cognition never gets back to baseline.  We talked about increased care needs postop including dressing changes, pain mgt, etc and need for therapy for safety and strengthening.  They indicated that inpatient rehab has been discussed.  I did explain that she would have to have the energy to do adequate therapy hours postop to qualify for inpatient rehab as opposed to a SNF.  It looks like she will need to live with her sister long-term due to her cognitive decline in order to be safe.  We discussed the importance of weaning from opioids to help with cognition after surgery.  I also recommend avoiding benzodiazepines which will worsen her cognition and increase her fall risk.   Of note, she currently notes the occasional pin prick sensation in her breast, but does not really have pain.   MOST:  DNR, limited additional interventions, ?abx, ?fluids, no feeding tube as discussed with  patient and here sister, Gerlene Burdock.  We had some technical difficulties signing and filling it in with the tablet.      Past Medical History:  Diagnosis Date  . Alzheimer's disease ([place])   . Cancer ([place])   . Disorder of bone and cartilage, unspecified   . Dizziness and giddiness   . Hyperlipidemia LDL goal < 100   . Hypertension   . Hypothyroidism   . Malignant neoplasm of thyroid gland ([place])   . Nontoxic uninodular goiter   . Other malaise and fatigue   . Other symptoms involving cardiovascular system   . Phlebitis and thrombophlebitis of  unspecified site   . Senile osteoporosis   . Sleep related leg cramps   . Stroke ([place])   . Unspecified disorder of lipoid metabolism   . Unspecified hereditary and idiopathic peripheral neuropathy   . Varicose veins of lower extremities with inflammation     Past Surgical History:  Procedure Laterality Date  . ABDOMINAL HYSTERECTOMY     DR HUGES  . BREAST BIOPSY Left 05/21/2016   malignant  . CATRACT SURGERY  2008  . COLON SURGERY     clipped polyp during colonoscopy  . KNEE SURGERY     right  . PARATHYROID ADENOMA REMOVED  1983  . THYROID SURGERY  July 20, 2011    No Known Allergies  Outpatient Encounter Medications as of 09/03/2019  Medication Sig  . alendronate (FOSAMAX) 70 MG tablet TAKE 1 TABLET (70 MG TOTAL) BY MOUTH EVERY 7 (SEVEN) DAYS. TAKE WITH A FULL GLASS OF WATER ON AN EMPTY STOMACH. (Patient taking differently: Take 70 mg by mouth every Monday. )  . allopurinol (ZYLOPRIM) 300 MG tablet TAKE 1 TABLET EVERY DAY (Patient taking differently: Take 300 mg by mouth every evening. )  . amLODipine (NORVASC) 10 MG tablet TAKE 1 TABLET EVERY DAY (Patient taking differently: Take 10 mg by mouth daily at 6 PM. )  . aspirin EC 81 MG tablet Take 1 tablet (81 mg total) by mouth daily.  Marland Kitchen atenolol (TENORMIN) 25 MG tablet TAKE 1 TABLET EVERY DAY (Patient taking differently: Take 25 mg by mouth daily. )  . atorvastatin (LIPITOR) 40 MG tablet TAKE 1 TABLET EVERY DAY (Patient taking differently: Take 40 mg by mouth daily at 6 PM. )  . calcium carbonate (TUMS - DOSED IN MG ELEMENTAL CALCIUM) 500 MG chewable tablet Chew 1 tablet by mouth as needed for indigestion or heartburn.  . Cholecalciferol (VITAMIN D) 2000 units CAPS Take 1 capsule (2,000 Units total) by mouth daily. (Patient taking differently: Take 1 capsule by mouth 3 (three) times a week. )  . hydrOXYzine (ATARAX/VISTARIL) 10 MG tablet Take 1 tablet (10 mg total) by mouth 3 (three) times daily as needed. (Patient taking  differently: Take 10 mg by mouth 3 (three) times daily as needed (anxiety). )  . iron polysaccharides (NIFEREX) 150 MG capsule Take 1 capsule (150 mg total) by mouth every other day. (Patient taking differently: Take 150 mg by mouth every other day as needed (energy). )  . levothyroxine (SYNTHROID, LEVOTHROID) 112 MCG tablet Take 112 mcg by mouth daily before breakfast.   . lisinopril (ZESTRIL) 2.5 MG tablet TAKE 1 TABLET DAILY TO CONTROL BLOOD PRESSURE (Patient taking differently: Take 2.5 mg by mouth daily at 3 pm. In the afternoon.)  . magnesium hydroxide (MILK OF MAGNESIA) 400 MG/5ML suspension Take 5-10 mLs by mouth daily as needed for mild constipation.  . sertraline (ZOLOFT) 50 MG  tablet TAKE ONE TABLET DAILY FOR DEPRESSION (Patient taking differently: Take 50 mg by mouth daily. TAKE ONE TABLET DAILY FOR DEPRESSION)  . [DISCONTINUED] anastrozole (ARIMIDEX) 1 MG tablet Take 1 tablet (1 mg total) by mouth daily. (Patient not taking: Reported on 08/28/2019)   No facility-administered encounter medications on file as of 09/03/2019.     Review of Systems:  Review of Systems  Constitutional: Positive for malaise/fatigue. Negative for chills and fever.  HENT: Positive for hearing loss.   Eyes: Negative for blurred vision.  Respiratory: Negative for shortness of breath.   Cardiovascular: Negative for chest pain, palpitations and leg swelling.  Gastrointestinal: Negative for abdominal pain and diarrhea.  Genitourinary: Negative for dysuria.       Urinary leakage/incontinence  Musculoskeletal: Positive for joint pain. Negative for falls.  Skin: Negative for itching and rash.  Neurological: Negative for dizziness and loss of consciousness.       Mild right-sided weakness from prior stroke  Psychiatric/Behavioral: Positive for depression and memory loss. The patient is not nervous/anxious and does not have insomnia.     Health Maintenance  Topic Date Due  . INFLUENZA VACCINE  09/15/2019  (Originally 08/01/2019)  . TETANUS/TDAP  06/23/2028  . DEXA SCAN  Completed  . PNA vac Low Risk Adult  Completed    Physical Exam: Vitals:   09/03/19 1112  BP: 140/70  Pulse: 68  Temp: 97.7 F (36.5 C)  TempSrc: Oral  SpO2: 97%  Weight: 157 lb (71.2 kg)  Height: 5\' 2"  (1.575 m)   Body mass index is 28.72 kg/m. Physical Exam Constitutional:      General: She is not in acute distress.    Appearance: Normal appearance. She is not toxic-appearing.  HENT:     Head: Normocephalic and atraumatic.     Ears:     Comments: HOH Eyes:     Comments: glasses  Cardiovascular:     Rate and Rhythm: Normal rate and regular rhythm.  Pulmonary:     Effort: Pulmonary effort is normal.     Breath sounds: Normal breath sounds.  Abdominal:     General: Bowel sounds are normal. There is no distension.     Palpations: There is no mass.     Tenderness: There is no abdominal tenderness.  Musculoskeletal: Normal range of motion.     Right lower leg: No edema.     Left lower leg: No edema.     Comments: Mild right hemiparesis s/p prior cva  Skin:    General: Skin is warm and dry.     Capillary Refill: Capillary refill takes less than 2 seconds.  Neurological:     General: No focal deficit present.     Mental Status: She is alert.     Gait: Gait abnormal.     Comments: Ataxic gait, uses rolling walker with skis  Psychiatric:     Comments: Tearful when discussing her breast cancer and upcoming surgery     Labs reviewed: Basic Metabolic Panel: Recent Labs    03/12/19 1417 08/11/19 1051 09/02/19 1230  NA 143 143 141  K 3.7 3.5 4.3  CL 108 109 106  CO2 27 28 24   GLUCOSE 80 101* 86  BUN 19 22 17   CREATININE 0.93 1.10* 1.06*  CALCIUM 9.3 9.6 9.7   Liver Function Tests: Recent Labs    02/12/19 1003 03/12/19 1417 08/11/19 1051  AST 27 31 29   ALT 23 29 27   ALKPHOS 67 61 52  BILITOT 0.8  0.7 0.8  PROT 7.7 7.2 7.5  ALBUMIN 3.9 3.7 4.0   No results for input(s): LIPASE, AMYLASE  in the last 8760 hours. No results for input(s): AMMONIA in the last 8760 hours. CBC: Recent Labs    02/12/19 1003 03/12/19 1417 08/11/19 1051  WBC 4.2 4.0 4.0  NEUTROABS 2.4 2.2 2.4  HGB 11.6* 10.6* 11.4*  HCT 38.0 35.0* 35.9*  MCV 96.2 95.4 94.2  PLT 184 169 184   Lipid Panel: No results for input(s): CHOL, HDL, LDLCALC, TRIG, CHOLHDL, LDLDIRECT in the last 8760 hours. Lab Results  Component Value Date   HGBA1C 5.0 11/09/2015    Procedures since last visit: Ct Chest W Contrast  Result Date: 08/26/2019 CLINICAL DATA:  Left breast cancer treated with oral chemotherapy. EXAM: CT CHEST WITH CONTRAST TECHNIQUE: Multidetector CT imaging of the chest was performed during intravenous contrast administration. CONTRAST:  38mL OMNIPAQUE IOHEXOL 300 MG/ML  SOLN COMPARISON:  CT abdomen pelvis 07/22/2015. FINDINGS: Cardiovascular: Atherosclerotic calcification of the aorta, aortic valve and coronary arteries. Pulmonary arteries are enlarged with marked dilatation of the proximal left pulmonary artery. Heart is enlarged. No pericardial effusion. Mediastinum/Nodes: Thyroidectomy. No pathologically enlarged mediastinal or hilar lymph nodes. Left internal mammary lymph node measures 5 mm (2/59). Left axillary lymph nodes measure up to 8 mm (2/61). No right axillary adenopathy. Esophagus is unremarkable. Lungs/Pleura: 2 mm peripheral left upper lobe nodule (7/62), likely benign. Lungs are otherwise clear. No pleural fluid. Airway is unremarkable. Upper Abdomen: Visualized portions of the liver, gallbladder, adrenal glands, kidneys, spleen, pancreas, stomach and bowel are unremarkable. No upper abdominal adenopathy. Musculoskeletal: Degenerative changes in the spine and shoulders, right greater than left. No worrisome lytic or sclerotic lesions. IMPRESSION: 1. No evidence of metastatic disease. Left internal mammary and left axillary lymph nodes are not considered pathologically enlarged. Continued attention  on follow-up exams is warranted. 2.  Aortic atherosclerosis (ICD10-170.0).  Coronary calcification. 3. Enlarged pulmonary arteries, particularly involving the left pulmonary artery, indicative of pulmonary arterial hypertension. Electronically Signed   By: Lorin Picket M.D.   On: 08/26/2019 08:18    Assessment/Plan 1. ACP (advance care planning) -48 minutes spent on ACP b/w discussion about surgery, postop and completing MOST -would have personally recommended hospice care given patient's overall wishes over the past 9 years; however, she's now planning on surgery  -I did my best to get a MOST done with her today which is now in vynca--DNR, limited additional interventions, unsure about fluids or abx, NO feeding tube -I'm still concerned she will end up in long-term care after this but hope for the best for her recovery and ability to return to her sister's home after some short-term rehab -we do not have a copy of a living will or HCPOA on file, only the DNR and now MOST from today  2. Malignant neoplasm of overlapping sites of left breast in female, estrogen receptor positive ([place]) -has failed oral and injectable chemo -for left modified mastectomy with Dr. Marlou Starks next week  3. Vascular dementia without behavioral disturbance ([place]) -MMSE 21/30--now living with her sister, fortunately -[name] is very clear that she is not medical and will not be doing things like dressing changes -apparently plans have been discussed about inpatient rehab (hoping pt able to do that much therapy) -she's high risk for delirium at the hospital (see hpi) -will need regular reorientation -avoid too much opioid use and avoid benzos -her sister being with her would be ideal to help keep her oriented -  recommend early palliative care involvement if she is not making a good recovery postop  4. Need for influenza vaccination - Flu Vaccine QUAD High Dose(Fluad) given today  Labs/tests ordered:  No new Next appt:   10/19/2019 to f/u post-surgery  Kaitlyn Franko L. Taylyn Brame, D.O. [facility] [address], [place] [address] Cell Phone (Mon-Fri 8am-5pm):  [phone] On Call:  [phone] & follow prompts after 5pm & weekends Office Phone:  [phone] Office Fax:  [phone]

## 2019-09-03 NOTE — Anesthesia Preprocedure Evaluation (Addendum)
Anesthesia Evaluation  Patient identified by MRN, date of birth, ID band Patient awake    Reviewed: Allergy & Precautions, NPO status , Patient's Chart, lab work & pertinent test results, reviewed documented beta blocker date and time   History of Anesthesia Complications Negative for: history of anesthetic complications  Airway Mallampati: II  TM Distance: >3 FB Neck ROM: Full    Dental  (+) Edentulous Upper, Missing, Dental Advisory Given   Pulmonary neg pulmonary ROS,  09/08/2019 SARS coronavirus NEG   Pulmonary exam normal breath sounds clear to auscultation       Cardiovascular hypertension, Pt. on medications and Pt. on home beta blockers (-) angina Rhythm:Regular Rate:Normal  '15 ECHO: EF 60-65%, mild MR, mild TR   Neuro/Psych PSYCHIATRIC DISORDERS (mild dementia) Depression CVA (doesn't walk well  )    GI/Hepatic negative GI ROS, Neg liver ROS,   Endo/Other  Hypothyroidism H/ thyroid cancer  Renal/GU negative Renal ROS     Musculoskeletal  (+) Arthritis , Osteoarthritis,    Abdominal   Peds  Hematology negative hematology ROS (+)   Anesthesia Other Findings Breast cancer  Reproductive/Obstetrics                           Anesthesia Physical Anesthesia Plan  ASA: III  Anesthesia Plan: General   Post-op Pain Management: GA combined w/ Regional for post-op pain   Induction: Intravenous  PONV Risk Score and Plan: 3 and Ondansetron, Dexamethasone and Treatment may vary due to age or medical condition  Airway Management Planned: LMA  Additional Equipment:   Intra-op Plan:   Post-operative Plan:   Informed Consent: I have reviewed the patients History and Physical, chart, labs and discussed the procedure including the risks, benefits and alternatives for the proposed anesthesia with the patient or authorized representative who has indicated his/her understanding and acceptance.      Dental advisory given  Plan Discussed with: CRNA and Surgeon  Anesthesia Plan Comments: (PAT note written by Myra Gianotti, PA-C. Plan routine monitors, GA with pec block for postop analgesia)      Anesthesia Quick Evaluation

## 2019-09-03 NOTE — Progress Notes (Addendum)
Anesthesia Chart Review:  Case: [id number] Date/Time: 09/10/19 1015   Procedure: LEFT MASTECTOMY MODIFIED RADICAL (Left Breast)   Anesthesia type: General   Pre-op diagnosis: LEFT BREAST CANCER   Location: [facility] OR ROOM 02 / [facility]   Surgeon: Jovita Kussmaul, MD      DISCUSSION: Patient is a 85 year old female scheduled for the above procedure.  History includes never smoker, left breast cancer (diagnosed DCIS 04/2016, opted against surgery; fulvestrant started 11/21/18 after disase progression noted on letrozole, but discontinued 08/11/19 with referral to surgery due to concern of caner getting "out of hand" as radiologic spread to left axilla), HTN, total thyroidectomy (07/20/11), post-surgical hypothyroidism, Alzheimer's disease, HLD, idiopathic peripheral neuropathy, CVA, phlebitis/thrombophlebitis.   Patient evaluated by her PCP Dr. Mariea Clonts on 09/03/19 to discuss recent breast cancer diagnosis and upcoming surgery including goals of care. See note in [facility].  PAT notes indicate that although patient does have some memory recall issues, that she is A&O x4 and able to sign surgical consent.  Preoperative labs show Cr 1.06, glucose 86. Unfortunately, specimen was insufficient for CBC so is scheduled for STAT CBC on the morning of surgery. Her H/H were stable at 11.4/35.9, PLT count 184 with 08/11/19 labs.  Her EKG was not felt significantly changed since 2017--also her lateral T wave inversion has really been present since 2002 and right BBB since at least 2015. Normal LVEF and wall motion, PA peak pressure 32 mmHg on 2015 echo. 08/2019 chest CT did show coronary calcifications and dilated PA, particularly on the left. She denied SOB, chest pain, cough, fever at PAT RN visit.   Presurgical COVID-19 test is scheduled for 09/08/19. If negative, and otherwise no acute changes, then I would anticipate that she can proceed as planned. Reviewed with anesthesiologist Suzette Battiest, MD.   VS: BP 135/65   Pulse 64    Temp 36.5 C   Resp 18   Ht 5\' 1"  (1.549 m)   Wt 70.9 kg   SpO2 100%   BMI 29.55 kg/m    PROVIDERS: Gayland Curry, DO is PCP  Magrinat, Sarajane Jews, MD is HEM-ONC   LABS: See DISCUSSION. For CBC on arrival.  (all labs ordered are listed, but only abnormal results are displayed)  Labs Reviewed  BASIC METABOLIC PANEL - Abnormal; Notable for the following components:      Result Value   Creatinine, Ser 1.06 (*)    GFR calc non Af Amer 46 (*)    GFR calc Af Amer 54 (*)    All other components within normal limits     IMAGES: CT Chest 08/25/19: IMPRESSION: 1. No evidence of metastatic disease. Left internal mammary and left axillary lymph nodes are not considered pathologically enlarged. Continued attention on follow-up exams is warranted. 2.  Aortic atherosclerosis (ICD10-170.0).  Coronary calcification. 3. Enlarged pulmonary arteries, particularly involving the left pulmonary artery, indicative of pulmonary arterial hypertension.   EKG:  EKG 09/02/19:  Normal sinus rhythm Left axis deviation Right bundle branch block T wave abnormality, consider lateral ischemia Abnormal ECG No significant change since last tracing [03/09/16] Confirmed by Camnitz, Will [phone]) on 09/02/2019 4:12:08 PM - She has had LAD and inferolateral T wave inversion on EKGs dating back to 10/24/01 (although T wave abnormality more prominent on presurgical EKG 07/16/11). RBBB present since at least 03/05/14.   CV: Carotid US 03/07/14: Summary:  - The vertebral arteries appear patent with antegrade flow.  - Findings consistent with 1-39 percent stenosis involving  the right internal carotid artery and the left internal  carotid artery.   Echo 03/05/14: Study Conclusions  - Left ventricle: The cavity size was normal. There was mild  concentric hypertrophy. Systolic function was normal. The  estimated ejection fraction was in the range of 60% to  65%. Wall motion was normal; there were no  regional wall  motion abnormalities. Doppler parameters are consistent  with abnormal left ventricular relaxation (grade 1  diastolic dysfunction). The E/e' ratio is <10, suggesting  normal LV filling pressure.  - Aortic valve: Trileaflet. Sclerosis without stenosis.  Trivial regurgitation.  - Mitral valve: Structurally normal valve. Mild  regurgitation.  - Left atrium: Moderately dilated (35 ml/m2).  - Right ventricle: The cavity size was normal. Wall thickness    was normal. Systolic function was normal.  - Tricuspid valve: Mild regurgitation.  - Pulmonic valve: The valve appears to be grossly normal.  Mild regurgitation.  - Pulmonary arteries: PA peak pressure: 93mm Hg (S).  - Inferior vena cava: The vessel was normal in size; the  respirophasic diameter changes were in the normal range (=  50%); findings are consistent with normal central venous  pressure.    Past Medical History:  Diagnosis Date  . Alzheimer's disease ([place])   . Cancer ([place])   . Disorder of bone and cartilage, unspecified   . Dizziness and giddiness   . Hyperlipidemia LDL goal < 100   . Hypertension   . Hypothyroidism   . Malignant neoplasm of thyroid gland ([place])   . Nontoxic uninodular goiter   . Other malaise and fatigue   . Other symptoms involving cardiovascular system   . Phlebitis and thrombophlebitis of unspecified site   . Senile osteoporosis   . Sleep related leg cramps   . Stroke ([place])   . Unspecified disorder of lipoid metabolism   . Unspecified hereditary and idiopathic peripheral neuropathy   . Varicose veins of lower extremities with inflammation     Past Surgical History:  Procedure Laterality Date  . ABDOMINAL HYSTERECTOMY     DR HUGES  . BREAST BIOPSY Left 05/21/2016   malignant  . CATRACT SURGERY  2008  . COLON SURGERY     clipped polyp during colonoscopy  . KNEE SURGERY     right  . PARATHYROID ADENOMA REMOVED  1983  . THYROID SURGERY  July 20, 2011     MEDICATIONS: . alendronate (FOSAMAX) 70 MG tablet  . allopurinol (ZYLOPRIM) 300 MG tablet  . amLODipine (NORVASC) 10 MG tablet  . aspirin EC 81 MG tablet  . atenolol (TENORMIN) 25 MG tablet  . atorvastatin (LIPITOR) 40 MG tablet  . calcium carbonate (TUMS - DOSED IN MG ELEMENTAL CALCIUM) 500 MG chewable tablet  . Cholecalciferol (VITAMIN D) 50 MCG (2000 UT) CAPS  . hydrOXYzine (ATARAX/VISTARIL) 10 MG tablet  . iron polysaccharides (NIFEREX) 150 MG capsule  . levothyroxine (SYNTHROID, LEVOTHROID) 112 MCG tablet  . lisinopril (ZESTRIL) 2.5 MG tablet  . magnesium hydroxide (MILK OF MAGNESIA) 400 MG/5ML suspension  . sertraline (ZOLOFT) 50 MG tablet   No current facility-administered medications for this encounter.     Myra Gianotti, PA-C Surgical Short Stay/Anesthesiology [facility] Phone [phone] [facility] Phone [phone] 09/03/2019 3:30 PM

## 2019-09-08 ENCOUNTER — Other Ambulatory Visit (HOSPITAL_COMMUNITY)
Admission: RE | Admit: 2019-09-08 | Discharge: 2019-09-08 | Disposition: A | Discharge status: Home / Self Care | Payer: Medicare Other | Source: Ambulatory Visit | Attending: General Surgery | Admitting: General Surgery

## 2019-09-08 DIAGNOSIS — Z20828 Contact with and (suspected) exposure to other viral communicable diseases: Secondary | ICD-10-CM | POA: Diagnosis not present

## 2019-09-08 DIAGNOSIS — Z01812 Encounter for preprocedural laboratory examination: Secondary | ICD-10-CM | POA: Insufficient documentation

## 2019-09-08 LAB — SARS CORONAVIRUS 2 (TAT 6-24 HRS): SARS Coronavirus 2: NEGATIVE

## 2019-09-10 ENCOUNTER — Observation Stay (HOSPITAL_COMMUNITY)
Admission: RE | Admit: 2019-09-10 | Discharge: 2019-09-13 | Disposition: A | Discharge status: Home Health | Payer: Medicare Other | Attending: General Surgery | Admitting: General Surgery

## 2019-09-10 ENCOUNTER — Ambulatory Visit (HOSPITAL_COMMUNITY): Payer: Medicare Other | Admitting: Certified Registered"

## 2019-09-10 ENCOUNTER — Ambulatory Visit (HOSPITAL_COMMUNITY): Payer: Medicare Other | Admitting: Vascular Surgery

## 2019-09-10 ENCOUNTER — Encounter (HOSPITAL_COMMUNITY)
Admission: RE | Disposition: A | Discharge status: Home Health | Payer: Self-pay | Source: Home / Self Care | Attending: General Surgery

## 2019-09-10 DIAGNOSIS — R2689 Other abnormalities of gait and mobility: Secondary | ICD-10-CM | POA: Diagnosis not present

## 2019-09-10 DIAGNOSIS — Z9221 Personal history of antineoplastic chemotherapy: Secondary | ICD-10-CM | POA: Diagnosis not present

## 2019-09-10 DIAGNOSIS — K219 Gastro-esophageal reflux disease without esophagitis: Secondary | ICD-10-CM | POA: Diagnosis not present

## 2019-09-10 DIAGNOSIS — Z1159 Encounter for screening for other viral diseases: Secondary | ICD-10-CM | POA: Diagnosis not present

## 2019-09-10 DIAGNOSIS — Z7982 Long term (current) use of aspirin: Secondary | ICD-10-CM | POA: Diagnosis not present

## 2019-09-10 DIAGNOSIS — E785 Hyperlipidemia, unspecified: Secondary | ICD-10-CM | POA: Insufficient documentation

## 2019-09-10 DIAGNOSIS — E039 Hypothyroidism, unspecified: Secondary | ICD-10-CM | POA: Insufficient documentation

## 2019-09-10 DIAGNOSIS — Z8673 Personal history of transient ischemic attack (TIA), and cerebral infarction without residual deficits: Secondary | ICD-10-CM | POA: Insufficient documentation

## 2019-09-10 DIAGNOSIS — Z79890 Hormone replacement therapy (postmenopausal): Secondary | ICD-10-CM | POA: Insufficient documentation

## 2019-09-10 DIAGNOSIS — C50912 Malignant neoplasm of unspecified site of left female breast: Secondary | ICD-10-CM | POA: Diagnosis present

## 2019-09-10 DIAGNOSIS — G309 Alzheimer's disease, unspecified: Secondary | ICD-10-CM | POA: Diagnosis not present

## 2019-09-10 DIAGNOSIS — C7989 Secondary malignant neoplasm of other specified sites: Secondary | ICD-10-CM | POA: Diagnosis present

## 2019-09-10 DIAGNOSIS — Z79899 Other long term (current) drug therapy: Secondary | ICD-10-CM | POA: Diagnosis not present

## 2019-09-10 DIAGNOSIS — F329 Major depressive disorder, single episode, unspecified: Secondary | ICD-10-CM | POA: Diagnosis not present

## 2019-09-10 DIAGNOSIS — D0512 Intraductal carcinoma in situ of left breast: Secondary | ICD-10-CM | POA: Diagnosis present

## 2019-09-10 DIAGNOSIS — Z85850 Personal history of malignant neoplasm of thyroid: Secondary | ICD-10-CM | POA: Insufficient documentation

## 2019-09-10 DIAGNOSIS — C50412 Malignant neoplasm of upper-outer quadrant of left female breast: Secondary | ICD-10-CM | POA: Diagnosis not present

## 2019-09-10 DIAGNOSIS — M109 Gout, unspecified: Secondary | ICD-10-CM | POA: Diagnosis not present

## 2019-09-10 DIAGNOSIS — C773 Secondary and unspecified malignant neoplasm of axilla and upper limb lymph nodes: Secondary | ICD-10-CM | POA: Insufficient documentation

## 2019-09-10 DIAGNOSIS — I10 Essential (primary) hypertension: Secondary | ICD-10-CM | POA: Insufficient documentation

## 2019-09-10 DIAGNOSIS — C50112 Malignant neoplasm of central portion of left female breast: Principal | ICD-10-CM | POA: Insufficient documentation

## 2019-09-10 DIAGNOSIS — F0280 Dementia in other diseases classified elsewhere without behavioral disturbance: Secondary | ICD-10-CM | POA: Insufficient documentation

## 2019-09-10 DIAGNOSIS — G8918 Other acute postprocedural pain: Secondary | ICD-10-CM | POA: Diagnosis not present

## 2019-09-10 HISTORY — PX: MASTECTOMY MODIFIED RADICAL: SHX5962

## 2019-09-10 LAB — CBC
HCT: 40.6 % (ref 36.0–46.0)
Hemoglobin: 12.5 g/dL (ref 12.0–15.0)
MCH: 29.6 pg (ref 26.0–34.0)
MCHC: 30.8 g/dL (ref 30.0–36.0)
MCV: 96 fL (ref 80.0–100.0)
Platelets: 189 10*3/uL (ref 150–400)
RBC: 4.23 MIL/uL (ref 3.87–5.11)
RDW: 15.3 % (ref 11.5–15.5)
WBC: 4.5 10*3/uL (ref 4.0–10.5)
nRBC: 0 % (ref 0.0–0.2)

## 2019-09-10 SURGERY — MASTECTOMY, MODIFIED RADICAL
Anesthesia: General | Site: Breast | Laterality: Left

## 2019-09-10 MED ORDER — PROPOFOL 500 MG/50ML IV EMUL
INTRAVENOUS | Status: DC | PRN
  Administered 2019-09-10: 25 ug/kg/min via INTRAVENOUS

## 2019-09-10 MED ORDER — LIDOCAINE 2% (20 MG/ML) 5 ML SYRINGE
INTRAMUSCULAR | Status: AC
  Filled 2019-09-10: qty 5

## 2019-09-10 MED ORDER — ACETAMINOPHEN 500 MG PO TABS
ORAL_TABLET | ORAL | Status: AC
  Filled 2019-09-10: qty 2

## 2019-09-10 MED ORDER — CEFAZOLIN SODIUM-DEXTROSE 2-4 GM/100ML-% IV SOLN
INTRAVENOUS | Status: AC
  Filled 2019-09-10: qty 100

## 2019-09-10 MED ORDER — FENTANYL CITRATE (PF) 250 MCG/5ML IJ SOLN
INTRAMUSCULAR | Status: AC
  Filled 2019-09-10: qty 5

## 2019-09-10 MED ORDER — LACTATED RINGERS IV SOLN
INTRAVENOUS | Status: DC
  Administered 2019-09-10 – 2019-09-11 (×5): via INTRAVENOUS

## 2019-09-10 MED ORDER — ATORVASTATIN CALCIUM 40 MG PO TABS
40.0000 mg | ORAL_TABLET | Freq: Every day | ORAL | Status: DC
  Administered 2019-09-10 – 2019-09-13 (×4): 40 mg via ORAL
  Filled 2019-09-10 (×4): qty 1

## 2019-09-10 MED ORDER — PROPOFOL 10 MG/ML IV BOLUS
INTRAVENOUS | Status: DC | PRN
  Administered 2019-09-10: 100 mg via INTRAVENOUS

## 2019-09-10 MED ORDER — MIDAZOLAM HCL 2 MG/2ML IJ SOLN
INTRAMUSCULAR | Status: AC
  Filled 2019-09-10: qty 2

## 2019-09-10 MED ORDER — METHOCARBAMOL 500 MG PO TABS
500.0000 mg | ORAL_TABLET | Freq: Four times a day (QID) | ORAL | Status: DC | PRN
  Administered 2019-09-11: 500 mg via ORAL
  Filled 2019-09-10: qty 1

## 2019-09-10 MED ORDER — ONDANSETRON HCL 4 MG/2ML IJ SOLN
INTRAMUSCULAR | Status: DC | PRN
  Administered 2019-09-10: 4 mg via INTRAVENOUS

## 2019-09-10 MED ORDER — DEXAMETHASONE SODIUM PHOSPHATE 10 MG/ML IJ SOLN
INTRAMUSCULAR | Status: DC | PRN
  Administered 2019-09-10: 4 mg via INTRAVENOUS

## 2019-09-10 MED ORDER — CEFAZOLIN SODIUM-DEXTROSE 2-4 GM/100ML-% IV SOLN: 2.0000 g | INTRAVENOUS | Status: DC

## 2019-09-10 MED ORDER — SODIUM CHLORIDE 0.9 % IV SOLN
INTRAVENOUS | Status: DC | PRN
  Administered 2019-09-10: 11:00:00 20 ug/min via INTRAVENOUS

## 2019-09-10 MED ORDER — FENTANYL CITRATE (PF) 250 MCG/5ML IJ SOLN
INTRAMUSCULAR | Status: DC | PRN
  Administered 2019-09-10: 50 ug via INTRAVENOUS
  Administered 2019-09-10: 25 ug via INTRAVENOUS

## 2019-09-10 MED ORDER — FENTANYL CITRATE (PF) 100 MCG/2ML IJ SOLN: 25.0000 ug | INTRAMUSCULAR | Status: DC | PRN

## 2019-09-10 MED ORDER — CELECOXIB 200 MG PO CAPS
ORAL_CAPSULE | ORAL | Status: AC
  Filled 2019-09-10: qty 1

## 2019-09-10 MED ORDER — LIDOCAINE 2% (20 MG/ML) 5 ML SYRINGE
INTRAMUSCULAR | Status: DC | PRN
  Administered 2019-09-10: 40 mg via INTRAVENOUS

## 2019-09-10 MED ORDER — ONDANSETRON HCL 4 MG/2ML IJ SOLN: 4.0000 mg | Freq: Four times a day (QID) | INTRAMUSCULAR | Status: DC | PRN

## 2019-09-10 MED ORDER — ALLOPURINOL 300 MG PO TABS
300.0000 mg | ORAL_TABLET | Freq: Every day | ORAL | Status: DC
  Administered 2019-09-10 – 2019-09-13 (×4): 300 mg via ORAL
  Filled 2019-09-10 (×4): qty 1

## 2019-09-10 MED ORDER — CHLORHEXIDINE GLUCONATE CLOTH 2 % EX PADS: 6.0000 | MEDICATED_PAD | Freq: Once | CUTANEOUS | Status: DC

## 2019-09-10 MED ORDER — LEVOTHYROXINE SODIUM 112 MCG PO TABS
112.0000 ug | ORAL_TABLET | Freq: Every day | ORAL | Status: DC
  Administered 2019-09-11 – 2019-09-13 (×3): 112 ug via ORAL
  Filled 2019-09-10 (×3): qty 1

## 2019-09-10 MED ORDER — GABAPENTIN 300 MG PO CAPS
ORAL_CAPSULE | ORAL | Status: AC
  Administered 2019-09-10: 300 mg
  Filled 2019-09-10: qty 1

## 2019-09-10 MED ORDER — FENTANYL CITRATE (PF) 100 MCG/2ML IJ SOLN
INTRAMUSCULAR | Status: AC
  Administered 2019-09-10: 50 ug
  Filled 2019-09-10: qty 2

## 2019-09-10 MED ORDER — AMLODIPINE BESYLATE 10 MG PO TABS
10.0000 mg | ORAL_TABLET | Freq: Every day | ORAL | Status: DC
  Administered 2019-09-11 – 2019-09-13 (×3): 10 mg via ORAL
  Filled 2019-09-10 (×4): qty 1

## 2019-09-10 MED ORDER — KETOROLAC TROMETHAMINE 30 MG/ML IJ SOLN
INTRAMUSCULAR | Status: AC
  Filled 2019-09-10: qty 1

## 2019-09-10 MED ORDER — PROPOFOL 1000 MG/100ML IV EMUL
INTRAVENOUS | Status: AC
  Filled 2019-09-10: qty 100

## 2019-09-10 MED ORDER — SODIUM CHLORIDE 0.9 % IV SOLN: INTRAVENOUS | Status: DC

## 2019-09-10 MED ORDER — CELECOXIB 200 MG PO CAPS
200.0000 mg | ORAL_CAPSULE | ORAL | Status: AC
  Administered 2019-09-10: 200 mg via ORAL

## 2019-09-10 MED ORDER — ATENOLOL 25 MG PO TABS
25.0000 mg | ORAL_TABLET | Freq: Every day | ORAL | Status: DC
  Administered 2019-09-11 – 2019-09-13 (×3): 25 mg via ORAL
  Filled 2019-09-10 (×3): qty 1

## 2019-09-10 MED ORDER — SERTRALINE HCL 50 MG PO TABS
25.0000 mg | ORAL_TABLET | Freq: Every day | ORAL | Status: DC
  Administered 2019-09-10 – 2019-09-13 (×4): 25 mg via ORAL
  Filled 2019-09-10 (×4): qty 1

## 2019-09-10 MED ORDER — ALBUTEROL SULFATE HFA 108 (90 BASE) MCG/ACT IN AERS
INHALATION_SPRAY | RESPIRATORY_TRACT | Status: AC
  Filled 2019-09-10: qty 6.7

## 2019-09-10 MED ORDER — PROPOFOL 10 MG/ML IV BOLUS
INTRAVENOUS | Status: AC
  Filled 2019-09-10: qty 20

## 2019-09-10 MED ORDER — SUGAMMADEX SODIUM 200 MG/2ML IV SOLN
INTRAVENOUS | Status: DC | PRN
  Administered 2019-09-10: 142.4 mg via INTRAVENOUS

## 2019-09-10 MED ORDER — ACETAMINOPHEN 500 MG PO TABS
1000.0000 mg | ORAL_TABLET | ORAL | Status: AC
  Administered 2019-09-10: 1000 mg via ORAL

## 2019-09-10 MED ORDER — ONDANSETRON HCL 4 MG/2ML IJ SOLN
INTRAMUSCULAR | Status: AC
  Filled 2019-09-10: qty 2

## 2019-09-10 MED ORDER — DEXAMETHASONE SODIUM PHOSPHATE 10 MG/ML IJ SOLN
INTRAMUSCULAR | Status: AC
  Filled 2019-09-10: qty 1

## 2019-09-10 MED ORDER — HEPARIN SODIUM (PORCINE) 5000 UNIT/ML IJ SOLN
5000.0000 [IU] | Freq: Three times a day (TID) | INTRAMUSCULAR | Status: DC
  Administered 2019-09-11 – 2019-09-13 (×7): 5000 [IU] via SUBCUTANEOUS
  Filled 2019-09-10 (×7): qty 1

## 2019-09-10 MED ORDER — PANTOPRAZOLE SODIUM 40 MG IV SOLR
40.0000 mg | Freq: Every day | INTRAVENOUS | Status: DC
  Administered 2019-09-10: 40 mg via INTRAVENOUS
  Filled 2019-09-10: qty 40

## 2019-09-10 MED ORDER — BUPIVACAINE-EPINEPHRINE (PF) 0.5% -1:200000 IJ SOLN
INTRAMUSCULAR | Status: DC | PRN
  Administered 2019-09-10: 30 mL

## 2019-09-10 MED ORDER — ROCURONIUM BROMIDE 10 MG/ML (PF) SYRINGE
PREFILLED_SYRINGE | INTRAVENOUS | Status: DC | PRN
  Administered 2019-09-10: 50 mg via INTRAVENOUS

## 2019-09-10 MED ORDER — GABAPENTIN 300 MG PO CAPS: 300.0000 mg | ORAL_CAPSULE | ORAL | Status: DC

## 2019-09-10 MED ORDER — LISINOPRIL 2.5 MG PO TABS
2.5000 mg | ORAL_TABLET | Freq: Every day | ORAL | Status: DC
  Administered 2019-09-10 – 2019-09-13 (×4): 2.5 mg via ORAL
  Filled 2019-09-10 (×3): qty 1

## 2019-09-10 MED ORDER — ONDANSETRON 4 MG PO TBDP: 4.0000 mg | ORAL_TABLET | Freq: Four times a day (QID) | ORAL | Status: DC | PRN

## 2019-09-10 MED ORDER — MORPHINE SULFATE (PF) 2 MG/ML IV SOLN: 1.0000 mg | INTRAVENOUS | Status: DC | PRN

## 2019-09-10 MED ORDER — HYDROCODONE-ACETAMINOPHEN 5-325 MG PO TABS
1.0000 | ORAL_TABLET | ORAL | Status: DC | PRN
  Filled 2019-09-10: qty 1

## 2019-09-10 MED ORDER — 0.9 % SODIUM CHLORIDE (POUR BTL) OPTIME
TOPICAL | Status: DC | PRN
  Administered 2019-09-10: 2000 mL

## 2019-09-10 SURGICAL SUPPLY — 45 items
ADH SKN CLS APL DERMABOND .7 (GAUZE/BANDAGES/DRESSINGS) ×1
APL PRP STRL LF DISP 70% ISPRP (MISCELLANEOUS) ×2
APPLIER CLIP 9.375 MED OPEN (MISCELLANEOUS) ×6
APR CLP MED 9.3 20 MLT OPN (MISCELLANEOUS) ×2
BINDER BREAST LRG (GAUZE/BANDAGES/DRESSINGS) IMPLANT
BINDER BREAST XLRG (GAUZE/BANDAGES/DRESSINGS) IMPLANT
BIOPATCH RED 1 DISK 7.0 (GAUZE/BANDAGES/DRESSINGS) ×3 IMPLANT
BIOPATCH RED 1IN DISK 7.0MM (GAUZE/BANDAGES/DRESSINGS) ×1
CANISTER SUCT 3000ML PPV (MISCELLANEOUS) ×3 IMPLANT
CHLORAPREP W/TINT 26 (MISCELLANEOUS) ×5 IMPLANT
CLIP APPLIE 9.375 MED OPEN (MISCELLANEOUS) ×1 IMPLANT
COVER SURGICAL LIGHT HANDLE (MISCELLANEOUS) ×3 IMPLANT
COVER WAND RF STERILE (DRAPES) ×3 IMPLANT
DERMABOND ADVANCED (GAUZE/BANDAGES/DRESSINGS) ×2
DERMABOND ADVANCED .7 DNX12 (GAUZE/BANDAGES/DRESSINGS) ×1 IMPLANT
DEVICE DISSECT PLASMABLAD 3.0S (MISCELLANEOUS) IMPLANT
DRAIN CHANNEL 19F RND (DRAIN) ×6 IMPLANT
DRAPE CHEST BREAST 15X10 FENES (DRAPES) ×3 IMPLANT
DRSG TEGADERM 2-3/8X2-3/4 SM (GAUZE/BANDAGES/DRESSINGS) ×2 IMPLANT
DRSG TEGADERM 4X4.75 (GAUZE/BANDAGES/DRESSINGS) ×4 IMPLANT
ELECT CAUTERY BLADE 6.4 (BLADE) ×3 IMPLANT
ELECT REM PT RETURN 9FT ADLT (ELECTROSURGICAL) ×3
ELECTRODE REM PT RTRN 9FT ADLT (ELECTROSURGICAL) ×1 IMPLANT
EVACUATOR SILICONE 100CC (DRAIN) ×6 IMPLANT
FILTER IN LINE W/DETACHED HOSE (FILTER) ×3 IMPLANT
GAUZE SPONGE 4X4 12PLY STRL (GAUZE/BANDAGES/DRESSINGS) ×3 IMPLANT
GLOVE BIO SURGEON STRL SZ7.5 (GLOVE) ×3 IMPLANT
GOWN STRL REUS W/ TWL LRG LVL3 (GOWN DISPOSABLE) ×2 IMPLANT
GOWN STRL REUS W/TWL LRG LVL3 (GOWN DISPOSABLE) ×6
KIT BASIN OR (CUSTOM PROCEDURE TRAY) ×3 IMPLANT
KIT TURNOVER KIT B (KITS) ×3 IMPLANT
LIGHT WAVEGUIDE WIDE FLAT (MISCELLANEOUS) IMPLANT
NS IRRIG 1000ML POUR BTL (IV SOLUTION) ×5 IMPLANT
PACK GENERAL/GYN (CUSTOM PROCEDURE TRAY) ×3 IMPLANT
PAD ARMBOARD 7.5X6 YLW CONV (MISCELLANEOUS) ×6 IMPLANT
PENCIL SMOKE EVACUATOR (MISCELLANEOUS) ×3 IMPLANT
PLASMABLADE 3.0S (MISCELLANEOUS) ×3
SPECIMEN JAR X LARGE (MISCELLANEOUS) IMPLANT
SUT ETHILON 3 0 FSL (SUTURE) ×6 IMPLANT
SUT MNCRL AB 4-0 PS2 18 (SUTURE) ×2 IMPLANT
SUT MON AB 4-0 PC3 18 (SUTURE) ×3 IMPLANT
SUT VIC AB 3-0 SH 18 (SUTURE) ×6 IMPLANT
TOWEL GREEN STERILE (TOWEL DISPOSABLE) ×3 IMPLANT
TUBE CONNECTING 12'X1/4 (SUCTIONS) ×1
TUBE CONNECTING 12X1/4 (SUCTIONS) ×1 IMPLANT

## 2019-09-10 NOTE — H&P (Signed)
Shelia Marks  Location: [facility] Patient #: [id number] DOB: 09-11-1926 Widowed / Language: [name] / Race: Black or African American Female   History of Present Illness  The patient is a 85 year old female who presents for a follow-up for Breast cancer. 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 She was treated with anti-estrogens but the tumor has grown. She now has multiple masses in the left breast as well as multiple enlarging lymph nodes in the left axilla. Since she has failed medical management her oncologist would like her to have a more definitive surgery at this point.   Allergies  No Known Drug Allergies   Medication History  Allopurinol (300MG  Tablet, Oral) Active. AmLODIPine Besylate (10MG  Tablet, Oral) Active. Lisinopril (2.5MG  Tablet, Oral) Active. Sertraline HCl (50MG  Tablet, Oral) Active. Aspirin (81MG  Tablet, Oral) Active. Atorvastatin Calcium (40MG  Tablet, Oral) Active. Levothyroxine Sodium (112MCG Tablet, Oral) Active. Alendronate Sodium (70MG  Tablet, Oral) Active. Medications Reconciled    Review of Systems General Not Present- Appetite Loss, Chills, Fatigue, Fever, Night Sweats, Weight Gain and Weight Loss. Skin Not Present- Change in Wart/Mole, Dryness, Hives, Jaundice, New Lesions, Non-Healing Wounds, Rash and Ulcer. HEENT Not Present- Earache, Hearing Loss, Hoarseness, Nose Bleed, Oral Ulcers, Ringing in the Ears, Seasonal Allergies, Sinus Pain, Sore Throat, Visual Disturbances, Wears glasses/contact lenses and Yellow Eyes. Respiratory Not Present- Bloody sputum, Chronic Cough, Difficulty Breathing, Snoring and Wheezing. Cardiovascular Present- Leg Cramps. Not Present- Chest Pain, Difficulty Breathing Lying Down, Palpitations, Rapid Heart Rate, Shortness of Breath and Swelling of Extremities. Gastrointestinal Present- Bloody Stool,  Constipation and Hemorrhoids. Not Present- Abdominal Pain, Bloating, Change in Bowel Habits, Chronic diarrhea, Difficulty Swallowing, Excessive gas, Gets full quickly at meals, Indigestion, Nausea, Rectal Pain and Vomiting. Neurological Present- Trouble walking. Not Present- Decreased Memory, Fainting, Headaches, Numbness, Seizures, Tingling, Tremor and Weakness. Psychiatric Present- Depression. Not Present- Anxiety, Bipolar, Change in Sleep Pattern, Fearful and Frequent crying.  Vitals Weight: 15.38 lb Height: 61in Body Surface Area: 0.63 m Body Mass Index: 2.91 kg/m  Temp.: 96.33F (Temporal)  Pulse: 99 (Regular)  BP: 162/88(Sitting, Left Arm, Standard)       Physical Exam  General Mental Status-Alert. General Appearance-Consistent with stated age. Hydration-Well hydrated. Voice-Normal.  Head and Neck Head-normocephalic, atraumatic with no lesions or palpable masses. Trachea-midline. Thyroid Gland Characteristics - normal size and consistency.  Eye Eyeball - Bilateral-Extraocular movements intact. Sclera/Conjunctiva - Bilateral-No scleral icterus.  Chest and Lung Exam Chest and lung exam reveals -quiet, even and easy respiratory effort with no use of accessory muscles and on auscultation, normal breath sounds, no adventitious sounds and normal vocal resonance. Inspection Chest Wall - Normal. Back - normal.  Breast Note: There is an ill-defined fullness centrally in the left breast compared to the right with some palpable mobile axillary lymph nodes.   Cardiovascular Cardiovascular examination reveals -normal heart sounds, regular rate and rhythm with no murmurs and normal pedal pulses bilaterally.  Abdomen Inspection Inspection of the abdomen reveals - No Hernias. Skin - Scar - no surgical scars. Palpation/Percussion Palpation and Percussion of the abdomen reveal - Soft, Non Tender, No Rebound tenderness, No Rigidity (guarding) and  No hepatosplenomegaly. Auscultation Auscultation of the abdomen reveals - Bowel sounds normal.  Neurologic Neurologic evaluation reveals -alert and oriented x 3 with no impairment of recent or remote memory. Mental Status-Normal.  Musculoskeletal Normal Exam - Left-Upper Extremity Strength Normal and Lower  Extremity Strength Normal. Normal Exam - Right-Upper Extremity Strength Normal and Lower Extremity Strength Normal.  Lymphatic Head & Neck  General Head & Neck Lymphatics: Bilateral - Description - Normal. Axillary  General Axillary Region: Bilateral - Description - Normal. Tenderness - Non Tender. Femoral & Inguinal  Generalized Femoral & Inguinal Lymphatics: Bilateral - Description - Normal. Tenderness - Non Tender.    Assessment & Plan   DUCTAL CARCINOMA IN SITU (DCIS) OF LEFT BREAST (D05.12) Impression: The patient was diagnosed with ductal carcinoma in situ of the left breast about 3 years ago. Given her age she was treated with anti-estrogens but the tumor has continued to grow. She now has multiple masses in the left breast and multiple enlarged axillary lymph nodes that have progressed on therapy. Because of this my recommendation would be for modified radical mastectomy. I have discussed this with her and her family in detail including the risks and benefits as well as some of the technical aspects and they understand and wish to proceed. She will likely need at least a short rehabilitation stay after surgery

## 2019-09-10 NOTE — Interval H&P Note (Signed)
History and Physical Interval Note:  09/10/2019 9:22 AM  Shelia Marks  has presented today for surgery, with the diagnosis of LEFT BREAST CANCER.  The various methods of treatment have been discussed with the patient and family. After consideration of risks, benefits and other options for treatment, the patient has consented to  Procedure(s): LEFT MASTECTOMY MODIFIED RADICAL (Left) as a surgical intervention.  The patient's history has been reviewed, patient examined, no change in status, stable for surgery.  I have reviewed the patient's chart and labs.  Questions were answered to the patient's satisfaction.     Autumn Messing III

## 2019-09-10 NOTE — Social Work (Signed)
CSW acknowledging consult for SNF placement. Will follow for therapy recommendations needed to best determine disposition/for insurance authorization.   Burech Mcfarland, MSW, LCSWA [facility] [phone]   

## 2019-09-10 NOTE — Anesthesia Postprocedure Evaluation (Signed)
Anesthesia Post Note  Patient: Shelia Marks  Procedure(s) Performed: LEFT MASTECTOMY MODIFIED RADICAL (Left Breast)     Patient location during evaluation: PACU Anesthesia Type: General Level of consciousness: oriented, patient cooperative and sedated Pain management: pain level controlled Vital Signs Assessment: post-procedure vital signs reviewed and stable Respiratory status: spontaneous breathing, nonlabored ventilation, respiratory function stable and patient connected to nasal cannula oxygen Cardiovascular status: blood pressure returned to baseline and stable Postop Assessment: no apparent nausea or vomiting Anesthetic complications: no    Last Vitals:  Vitals:   09/10/19 1226 09/10/19 1230  BP: (!) 173/73   Pulse: 72 67  Resp: 14 17  Temp: 36.6 C   SpO2: 100% 100%    Last Pain:  Vitals:   09/10/19 1226  PainSc: Asleep                 Sargent Mankey,E. Caroljean Monsivais

## 2019-09-10 NOTE — Transfer of Care (Signed)
Immediate Anesthesia Transfer of Care Note  Patient: Shelia Marks  Procedure(s) Performed: LEFT MASTECTOMY MODIFIED RADICAL (Left Breast)  Patient Location: PACU  Anesthesia Type:General  Level of Consciousness: awake, alert  and oriented  Airway & Oxygen Therapy: Patient Spontanous Breathing and Patient connected to nasal cannula oxygen  Post-op Assessment: Report given to RN, Post -op Vital signs reviewed and stable and Patient moving all extremities  Post vital signs: Reviewed and stable  Last Vitals:  Vitals Value Taken Time  BP 173/73 09/10/19 1226  Temp 36.6 C 09/10/19 1226  Pulse 71 09/10/19 1231  Resp 14 09/10/19 1231  SpO2 100 % 09/10/19 1231  Vitals shown include unvalidated device data.  Last Pain: There were no vitals filed for this visit.       Complications: No apparent anesthesia complications

## 2019-09-10 NOTE — Op Note (Signed)
09/10/2019  12:04 PM  PATIENT:  Shelia Marks  85 y.o. female  PRE-OPERATIVE DIAGNOSIS:  LEFT BREAST CANCER  POST-OPERATIVE DIAGNOSIS:  LEFT BREAST CANCER  PROCEDURE:  Procedure(s): LEFT MASTECTOMY MODIFIED RADICAL (Left)  SURGEON:  Surgeon(s) and Role:    * Jovita Kussmaul, MD - Primary  PHYSICIAN ASSISTANT:   ASSISTANTS: Judyann Munson, RNFA   ANESTHESIA:   general  EBL:  minimal   BLOOD ADMINISTERED:none  DRAINS: (1) Jackson-Pratt drain(s) with closed bulb suction in the prepectoral space   LOCAL MEDICATIONS USED:  MARCAINE     SPECIMEN:  Source of Specimen:  left breast and axillary contents  DISPOSITION OF SPECIMEN:  PATHOLOGY  COUNTS:  YES  TOURNIQUET:  * No tourniquets in log *  DICTATION: .Dragon Dictation   After informed consent was obtained the patient was brought to the operating room and placed in the supine position on the operating table.  After adequate induction of general anesthesia the patient's left chest, breast, and axillary area were prepped with ChloraPrep, allowed to dry, and draped in usual sterile manner.  An appropriate timeout was performed.  An elliptical incision was then made around the nipple and areola complex in order to minimize the excess skin.  The incision was carried through the skin and subcutaneous tissue sharply with the plasma blade.  Breast hooks were then used to elevate the skin flaps anteriorly towards the saline.  Thin skin flaps were then created circumferentially by dissecting between the breast tissue and the subcutaneous fat.  This dissection was carried all the way to the chest wall circumferentially.  Next the breast was removed from the pectoralis muscle with the pectoralis fascia.  Laterally the dissection was carried laterally to the latissimus muscle and medially to the serratus muscle.  The dissection was then carried out superiorly until we identified the left axillary vein.  The contents within these boundaries  of the axilla were then dissected out by blunt right angle dissection.  Several small intercostal brachial nerves and vessels were controlled with clips.  The long thoracic and thoracodorsal nerves were identified and spared.  Once this was accomplished the entire left axillary contents en bloc were removed with the breast and so all of this was sent to pathology for further evaluation.  Hemostasis was achieved using the plasma blade.  The wound was irrigated with copious amounts of saline.  A small stab incision was made near the anterior axillary line inferior to the operative bed.  A tonsil clamp was placed through this opening and used to bring a 19 [place] round Blake drain into the operative bed.  The drain was anchored to the skin with a 3-0 nylon stitch.  Next the superior and inferior skin flaps were grossly reapproximated with interrupted 3-0 Vicryl stitches.  The skin was then closed with a running 4-0 Monocryl subcuticular stitch.  Dermabond dressings were applied.  The patient tolerated the procedure well.  At the end of the case all needle sponge and instrument counts were correct.  The patient was then awakened and taken to recovery in stable condition.  PLAN OF CARE: Admit for overnight observation  PATIENT DISPOSITION:  PACU - hemodynamically stable.   Delay start of Pharmacological VTE agent (>24hrs) due to surgical blood loss or risk of bleeding: no

## 2019-09-10 NOTE — Anesthesia Procedure Notes (Signed)
Procedure Name: Intubation Date/Time: 09/10/2019 10:38 AM Performed by: Amadeo Garnet, CRNA Pre-anesthesia Checklist: Patient identified, Emergency Drugs available, Suction available, Patient being monitored and Timeout performed Patient Re-evaluated:Patient Re-evaluated prior to induction Oxygen Delivery Method: Circle system utilized Preoxygenation: Pre-oxygenation with 100% oxygen Induction Type: IV induction Ventilation: Mask ventilation without difficulty Laryngoscope Size: Mac and 3 Grade View: Grade I Tube type: Oral Tube size: 7.0 mm Number of attempts: 1 Airway Equipment and Method: Stylet Placement Confirmation: ETT inserted through vocal cords under direct vision,  positive ETCO2,  CO2 detector and breath sounds checked- equal and bilateral Secured at: 21 cm Tube secured with: Tape Dental Injury: Teeth and Oropharynx as per pre-operative assessment

## 2019-09-10 NOTE — Anesthesia Procedure Notes (Signed)
Anesthesia Regional Block: Pectoralis block   Pre-Anesthetic Checklist: ,, timeout performed, Correct Patient, Correct Site, Correct Laterality, Correct Procedure, Correct Position, site marked, Risks and benefits discussed,  Surgical consent,  Pre-op evaluation,  At surgeon's request and post-op pain management  Laterality: Left  Prep: chloraprep       Needles:  Injection technique: Single-shot  Needle Type: Echogenic Needle     Needle Length: 9cm  Needle Gauge: 21     Additional Needles:   Procedures:,,,, ultrasound used (permanent image in chart),,,,  Narrative:  Start time: 09/10/2019 10:16 AM End time: 09/10/2019 10:23 AM Injection made incrementally with aspirations every 5 mL.  Performed by: Personally  Anesthesiologist: Annye Asa, MD  Additional Notes: Pt identified in Holding room.  Monitors applied. Working IV access confirmed. Sterile prep, drape L clavicle and pec.  #21ga ECHgenic needle sub pec minor, between ribs 4,5 with US guidance.  30cc 0.5% Bupivacaine with 1:200k epi injected incrementally after negative test dose.  Patient asymptomatic, VSS, no heme aspirated, tolerated well.   Jenita Seashore, MD

## 2019-09-11 ENCOUNTER — Encounter (HOSPITAL_COMMUNITY): Payer: Self-pay | Admitting: General Surgery

## 2019-09-11 ENCOUNTER — Other Ambulatory Visit: Payer: Self-pay

## 2019-09-11 DIAGNOSIS — C773 Secondary and unspecified malignant neoplasm of axilla and upper limb lymph nodes: Secondary | ICD-10-CM | POA: Diagnosis not present

## 2019-09-11 DIAGNOSIS — I10 Essential (primary) hypertension: Secondary | ICD-10-CM | POA: Diagnosis not present

## 2019-09-11 DIAGNOSIS — F329 Major depressive disorder, single episode, unspecified: Secondary | ICD-10-CM | POA: Diagnosis not present

## 2019-09-11 DIAGNOSIS — G309 Alzheimer's disease, unspecified: Secondary | ICD-10-CM | POA: Diagnosis not present

## 2019-09-11 DIAGNOSIS — C50112 Malignant neoplasm of central portion of left female breast: Secondary | ICD-10-CM | POA: Diagnosis not present

## 2019-09-11 DIAGNOSIS — E039 Hypothyroidism, unspecified: Secondary | ICD-10-CM | POA: Diagnosis not present

## 2019-09-11 MED ORDER — PANTOPRAZOLE SODIUM 40 MG PO TBEC
40.0000 mg | DELAYED_RELEASE_TABLET | Freq: Every day | ORAL | Status: DC
  Administered 2019-09-11 – 2019-09-12 (×2): 40 mg via ORAL
  Filled 2019-09-11 (×2): qty 1

## 2019-09-11 MED ORDER — INFLUENZA VAC A&B SA ADJ QUAD 0.5 ML IM PRSY
0.5000 mL | PREFILLED_SYRINGE | INTRAMUSCULAR | Status: DC
  Filled 2019-09-11: qty 0.5

## 2019-09-11 MED ORDER — ADULT MULTIVITAMIN W/MINERALS CH
1.0000 | ORAL_TABLET | Freq: Every day | ORAL | Status: DC
  Administered 2019-09-11 – 2019-09-13 (×3): 1 via ORAL
  Filled 2019-09-11 (×3): qty 1

## 2019-09-11 MED ORDER — ENSURE ENLIVE PO LIQD
237.0000 mL | Freq: Two times a day (BID) | ORAL | Status: DC
  Administered 2019-09-11 – 2019-09-12 (×3): 237 mL via ORAL

## 2019-09-11 NOTE — Evaluation (Signed)
Physical Therapy Evaluation Patient Details Name: Shelia Marks MRN: [id number] DOB: 1926-06-28 Today's Date: 09/11/2019   History of Present Illness  Pt is a 85 year old woman with cancer of the L breast that did not respond well to oral medications admitted for L modified radical mastectomy on 09/10/19. PMH includes dementia, thyroid ca, HTN, depression, OA, peripheral neuropathy, CVA.    Clinical Impression  Pt presents with an overall decrease in functional mobility secondary to above. PTA, pt living with sister and family support, ambulatory with intermittent use of RW. Today, pt requiring up to minA for balance with ambulation; pt at high risk for falls due to generalized weakness, cognitive status and urinary urgency. Pt using LUE well throughout session, no c/o pain. Pt would benefit from continued acute PT services to maximize functional mobility and independence prior to d/c with HHPT and 24/7 assist.    Follow Up Recommendations Home health PT;Supervision/Assistance - 24 hour    Equipment Recommendations  Rolling walker with 5" wheels;3in1 (PT)    Recommendations for Other Services       Precautions / Restrictions Precautions Precautions: Fall Precaution Comments: S/p masectomy w/ chest binder; L-side JP drain Restrictions Weight Bearing Restrictions: No      Mobility  Bed Mobility Overal bed mobility: Modified Independent             General bed mobility comments: Increased time and effort, cues to stay on task; no physical assist required  Transfers Overall transfer level: Needs assistance Equipment used: Rolling walker (2 wheeled) Transfers: Sit to/from Stand Sit to Stand: Min guard;Min assist         General transfer comment: Multiple sit<>stands from bed for pericare and donning underwear, pt requiring intermittent minA for trunk elevation and steadying assist; repeated cues for hand placement  Ambulation/Gait Ambulation/Gait assistance: Min  guard;Min assist Gait Distance (Feet): 60 Feet Assistive device: Rolling walker (2 wheeled) Gait Pattern/deviations: Step-through pattern;Decreased stride length;Leaning posteriorly;Trunk flexed Gait velocity: Decreased   General Gait Details: Unsteady gait with RW and intermittent minA for balance, pt leaving RW to reach for furniture, increased urgency to urinate leaving RW behind to walk quickly to toilet requiring minA for balance; max, multimodal cues for safety  Stairs            Wheelchair Mobility    Modified Rankin (Stroke Patients Only)       Balance Overall balance assessment: Needs assistance   Sitting balance-Leahy Scale: Fair       Standing balance-Leahy Scale: Poor                               Pertinent Vitals/Pain Pain Assessment: Faces Faces Pain Scale: Hurts a little bit Pain Location: Chest Pain Descriptors / Indicators: Guarding Pain Intervention(s): Monitored during session    Home Living Family/patient expects to be discharged to:: Private residence Living Arrangements: Other relatives(sister/niece) Available Help at Discharge: Family;Available 24 hours/day Type of Home: House Home Access: Stairs to enter Entrance Stairs-Rails: Right;Left;Can reach both Entrance Stairs-Number of Steps: 2 Home Layout: One level Home Equipment: Walker - 2 wheels;Cane - single point      Prior Function Level of Independence: Independent with assistive device(s)         Comments: walks with a cane inside and RW outside; wears Depends     Hand Dominance   Dominant Hand: Right    Extremity/Trunk Assessment   Upper Extremity Assessment Upper Extremity Assessment:  RUE deficits/detail RUE Deficits / Details: limited ROM, pt reports h/o rotator cuff injury    Lower Extremity Assessment Lower Extremity Assessment: Generalized weakness       Communication   Communication: HOH  Cognition Arousal/Alertness: Awake/alert Behavior  During Therapy: WFL for tasks assessed/performed Overall Cognitive Status: History of cognitive impairments - at baseline                                 General Comments: Poor safety awareness, frequent cues and redirection to task/safe mobility      General Comments      Exercises     Assessment/Plan    PT Assessment Patient needs continued PT services  PT Problem List Decreased strength;Decreased activity tolerance;Decreased balance;Decreased mobility;Decreased knowledge of use of DME;Decreased cognition;Decreased safety awareness;Decreased knowledge of precautions       PT Treatment Interventions DME instruction;Gait training;Stair training;Functional mobility training;Therapeutic activities;Therapeutic exercise;Balance training;Patient/family education    PT Goals (Current goals can be found in the Care Plan section)  Acute Rehab PT Goals Patient Stated Goal: Return to sister's home with family support; pt recognizes she is no longer safe to live alone PT Goal Formulation: With patient Time For Goal Achievement: 09/25/19 Potential to Achieve Goals: Good    Frequency Min 3X/week   Barriers to discharge        Co-evaluation               AM-PAC PT "6 Clicks" Mobility  Outcome Measure Help needed turning from your back to your side while in a flat bed without using bedrails?: None Help needed moving from lying on your back to sitting on the side of a flat bed without using bedrails?: None Help needed moving to and from a bed to a chair (including a wheelchair)?: A Little Help needed standing up from a chair using your arms (e.g., wheelchair or bedside chair)?: A Little Help needed to walk in hospital room?: A Little Help needed climbing 3-5 steps with a railing? : A Lot 6 Click Score: 19    End of Session   Activity Tolerance: Patient tolerated treatment well Patient left: in chair;with call bell/phone within reach;with chair alarm set Nurse  Communication: Mobility status PT Visit Diagnosis: Other abnormalities of gait and mobility (R26.89);Muscle weakness (generalized) (M62.81)    Time: [id number] PT Time Calculation (min) (ACUTE ONLY): 26 min   Charges:   PT Evaluation $PT Eval Moderate Complexity: [place], PT, DPT Acute Rehabilitation Services  Pager [phone] Office [phone]  Derry Lory 09/11/2019, 2:59 PM

## 2019-09-11 NOTE — Progress Notes (Signed)
Initial Nutrition Assessment  DOCUMENTATION CODES:   Not applicable  INTERVENTION:   -Continue Ensure Enlive po BID, each supplement provides 350 kcal and 20 grams of protein -MVI with minerals daily  NUTRITION DIAGNOSIS:   Increased nutrient needs related to post-op healing as evidenced by estimated needs.  GOAL:   Patient will meet greater than or equal to 90% of their needs  MONITOR:   PO intake, Supplement acceptance, Labs, Weight trends, Skin, I & O's  REASON FOR ASSESSMENT:   Malnutrition Screening Tool    ASSESSMENT:   The patient is a 85 year old female who presents for a follow-up for Breast cancer. 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  She was treated with anti-estrogens but the tumor has grown.  She now has multiple masses in the left breast as well as multiple enlarging lymph nodes in the left axilla.  Since she has failed medical management her oncologist would like her to have a more definitive surgery at this point.  Pt admitted with lt breast cancer.   9/10- s/p Procedure(s): LEFT MASTECTOMY MODIFIED RADICAL (Left)  Reviewed I/O's: +1.7 L x 24 hours   UOP: 0 ml x 24 hours  Drain output: 112 ml x 24 hours  Attempted to speak with pt by phone, however, no answer. RD unable to obtain further nutrition-related history at this time.   Per documented meal intake, pt consumed 100% of meal.   Reviewed wt hx; noted pt has experienced a 2.7% wt loss over the past 6 months, which is not significant for time frame.   Pt with increased nutritional needs for post-op healing and would benefit from addition of oral nutrition supplements.   Medications reviewed and include lactated ringers infusion @ 50 ml/hr.   Per CSW notes, pt family would like home health assistance at discharge.   Labs reviewed.   Diet Order:   Diet Order            DIET SOFT Room  service appropriate? Yes; Fluid consistency: Thin  Diet effective now              EDUCATION NEEDS:   No education needs have been identified at this time  Skin:  Skin Assessment: Skin Integrity Issues: Skin Integrity Issues:: Incisions Incisions: closed lt breast  Last BM:  09/09/19  Height:   Ht Readings from Last 1 Encounters:  09/11/19 5\' 2"  (1.575 m)    Weight:   Wt Readings from Last 1 Encounters:  09/11/19 71.2 kg    Ideal Body Weight:  50 kg  BMI:  Body mass index is 28.71 kg/m.  Estimated Nutritional Needs:   Kcal:  1550-1750  Protein:  75-90 grams  Fluid:  > 1.5 L    Farren [name] A. Jimmye Norman, RD, LDN, [place] Registered Dietitian II Certified Diabetes Care and Education Specialist Pager: [phone] After hours Pager: [phone]

## 2019-09-11 NOTE — Evaluation (Signed)
Occupational Therapy Evaluation Patient Details Name: Shelia Marks MRN: [id number] DOB: 1926/07/05 Today's Date: 09/11/2019    History of Present Illness Pt is a 85 year old woman with cancer of the L breast that did not respond well to oral medications admitted for L modified radical mastectomy on 09/10/19. PMH includes dementia, thyroid ca, HTN, depression, OA, peripheral neuropathy, CVA.   Clinical Impression   Pt reports using a RW outside and a cane inside prior to admission. She sponge bathes and dresses herself. Her sister and niece have been managing IADL. Pt presents with impaired cognition and safety, likely baseline. She needs up to mod assist for ADL and up to min assist for OOB mobility. Pt will need very close supervision upon d/c, plans to go back to her sister's home. Recommending HHOT.    Follow Up Recommendations  Home health OT;Supervision/Assistance - 24 hour    Equipment Recommendations  3 in 1 bedside commode    Recommendations for Other Services       Precautions / Restrictions Precautions Precautions: Fall Precaution Comments: S/p mastectomy w/ chest binder; L-side JP drain Restrictions Weight Bearing Restrictions: No      Mobility Bed Mobility Overal bed mobility: Modified Independent             General bed mobility comments: Increased time and effort, cues to stay on task; no physical assist required  Transfers Overall transfer level: Needs assistance Equipment used: Rolling walker (2 wheeled) Transfers: Sit to/from Stand Sit to Stand: Min guard;Min assist         General transfer comment: Multiple sit<>stands from bed for pericare and donning underwear, pt requiring intermittent minA for trunk elevation and steadying assist; repeated cues for hand placement    Balance Overall balance assessment: Needs assistance   Sitting balance-Leahy Scale: Fair       Standing balance-Leahy Scale: Poor                              ADL either performed or assessed with clinical judgement   ADL Overall ADL's : Needs assistance/impaired Eating/Feeding: Independent;Sitting   Grooming: Wash/dry hands;Standing;Min guard   Upper Body Bathing: Minimal assistance;Sitting   Lower Body Bathing: Moderate assistance;Sit to/from stand   Upper Body Dressing : Minimal assistance;Sitting   Lower Body Dressing: Moderate assistance;Sit to/from stand   Toilet Transfer: Minimal assistance;Ambulation;RW;BSC   Toileting- Clothing Manipulation and Hygiene: Moderate assistance;Sit to/from stand       Functional mobility during ADLs: Minimal assistance;Min guard;Rolling walker;Cueing for safety General ADL Comments: pt leaning to floor in attempt to pick items off of the floor and then complaining it made her dizzy     Vision Patient Visual Report: No change from baseline       Perception     Praxis      Pertinent Vitals/Pain Pain Assessment: Faces Faces Pain Scale: Hurts a little bit Pain Location: Chest Pain Descriptors / Indicators: Guarding Pain Intervention(s): Monitored during session     Hand Dominance Right   Extremity/Trunk Assessment Upper Extremity Assessment Upper Extremity Assessment: RUE deficits/detail RUE Deficits / Details: limited ROM, pt reports h/o rotator cuff injury RUE Coordination: decreased gross motor   Lower Extremity Assessment Lower Extremity Assessment: Defer to PT evaluation       Communication Communication Communication: HOH   Cognition Arousal/Alertness: Awake/alert Behavior During Therapy: WFL for tasks assessed/performed Overall Cognitive Status: History of cognitive impairments - at baseline  General Comments: Poor safety awareness, frequent cues and redirection to task/safe mobility   General Comments       Exercises     Shoulder Instructions      Home Living Family/patient expects to be discharged to:: Private  residence Living Arrangements: Other relatives(sister and niece) Available Help at Discharge: Family;Available 24 hours/day Type of Home: House Home Access: Stairs to enter [employer] of Steps: 2 Entrance Stairs-Rails: Right;Left;Can reach both Home Layout: One level     Bathroom Shower/Tub: Teacher, early years/pre: Standard     Home Equipment: Environmental consultant - 2 wheels;Cane - single point          Prior Functioning/Environment Level of Independence: Independent with assistive device(s)        Comments: walks with a cane inside and RW outside; wears Depends, takes sponge bath        OT Problem List: Impaired balance (sitting and/or standing);Decreased knowledge of use of DME or AE;Pain;Decreased cognition;Decreased safety awareness      OT Treatment/Interventions: Self-care/ADL training;DME and/or AE instruction;Patient/family education;Balance training;Therapeutic activities    OT Goals(Current goals can be found in the care plan section) Acute Rehab OT Goals Patient Stated Goal: Return to sister's home with family support; pt recognizes she is no longer safe to live alone OT Goal Formulation: With patient Time For Goal Achievement: 09/25/19 Potential to Achieve Goals: Good ADL Goals Pt Will Perform Grooming: with supervision;standing Pt Will Perform Lower Body Bathing: with supervision;sit to/from stand Pt Will Perform Lower Body Dressing: with supervision;sit to/from stand Pt Will Transfer to Toilet: with supervision;bedside commode Pt Will Perform Toileting - Clothing Manipulation and hygiene: with supervision;sit to/from stand  OT Frequency: Min 2X/week   Barriers to D/C:            Co-evaluation              AM-PAC OT "6 Clicks" Daily Activity     Outcome Measure Help from another person eating meals?: None Help from another person taking care of personal grooming?: A Little Help from another person toileting, which includes using  toliet, bedpan, or urinal?: A Little Help from another person bathing (including washing, rinsing, drying)?: A Lot Help from another person to put on and taking off regular upper body clothing?: A Little Help from another person to put on and taking off regular lower body clothing?: A Lot 6 Click Score: 17   End of Session Equipment Utilized During Treatment: Rolling walker Nurse Communication: Mobility status  Activity Tolerance: Patient tolerated treatment well Patient left: in chair;with call bell/phone within reach;with chair alarm set  OT Visit Diagnosis: Unsteadiness on feet (R26.81);Other abnormalities of gait and mobility (R26.89);Pain;Muscle weakness (generalized) (M62.81);Other symptoms and signs involving cognitive function                Time: 1329-1356 OT Time Calculation (min): 27 min Charges:  OT General Charges $OT Visit: 1 Visit OT Evaluation $OT Eval Moderate Complexity: 1 Mod  Nestor Lewandowsky, OTR/L Acute Rehabilitation Services Pager: [phone] Office: [phone] Malka So 09/11/2019, 3:27 PM

## 2019-09-11 NOTE — Plan of Care (Signed)
  Problem: Education: Goal: Knowledge of disease or condition will improve Outcome: Progressing   Problem: Pain Management: Goal: Expressions of feelings of enhanced comfort will increase Outcome: Progressing

## 2019-09-11 NOTE — TOC Initial Note (Signed)
Transition of Care [facility]) - Initial/Assessment Note   Patient Details  Name: Shelia Marks MRN: [id number] Date of Birth: 1926/04/15  Transition of [facility]) CM/SW Contact:    Alexander Mt, [place] Phone Number: 09/11/2019, 1:48 PM  Clinical Narrative:                 CSW spoke with pt sister [name] via telephone.  Pt from home alone usually but recently has been staying with [name]. She utilizes a walker to get around the home but at baseline only ambulates short distances. Pt sister is worried about pt having assistance with drain care and assistance with ADL/IADLs. Pt sister requesting for pt to stay at hospital if possible or a [facility] SNF as she is worried about COVID. We discussed that pt is outpatient in bed and that the closest SNF is in [place] that [facility] manages. She then asks about a RN coming to the home to help with drain care. We discussed that pt may be eligible for [facility] services through [facility]. Pt sister in agreement with this referral. She would bring pt home to her house: [address] [place], [place].  Referral called to [facility] with Alvis Lemmings, message sent to therapy services.   Expected Discharge Plan: [place] Barriers to Discharge: Continued Medical Work up   Patient Goals and CMS Choice Patient states their goals for this hospitalization and ongoing recovery are:: for her to have help with the drains CMS Medicare.gov Compare Post Acute Care list provided to:: Patient Represenative (must comment)(pt sibling [name]) Choice offered to / list presented to : Sibling  Expected Discharge Plan and Services Expected Discharge Plan: [place] In-house Referral: Clinical Social Work Discharge Planning Services: CM Consult Post Acute Care Choice: Home Health, Durable Medical Equipment Living arrangements for the past 2 months: [place]  Prior Living Arrangements/Services Living arrangements for the past 2 months:  Single Family Home Lives with:: Siblings Patient language and need for interpreter reviewed:: Yes Do you feel safe going back to the place where you live?: Yes      Need for Family Participation in Patient Care: Yes (Comment)(supervision/assistance with drain care) Care giver support system in place?: Yes (comment)(pt sister) Current home services: DME Criminal Activity/Legal Involvement Pertinent to Current Situation/Hospitalization: No - Comment as needed  Activities of Daily Living Home Assistive Devices/Equipment: Gilford Rile (specify type) ADL Screening (condition at time of admission) Patient's cognitive ability adequate to safely complete daily activities?: No Is the patient deaf or have difficulty hearing?: Yes Does the patient have difficulty seeing, even when wearing glasses/contacts?: No Does the patient have difficulty concentrating, remembering, or making decisions?: No Patient able to express need for assistance with ADLs?: Yes Does the patient have difficulty dressing or bathing?: Yes Independently performs ADLs?: No Communication: Independent Dressing (OT): Needs assistance Is this a change from baseline?: Pre-admission baseline Grooming: Independent Feeding: Independent Bathing: Independent Toileting: Independent In/Out Bed: Needs assistance Is this a change from baseline?: Pre-admission baseline Walks in Home: Needs assistance Is this a change from baseline?: Pre-admission baseline Does the patient have difficulty walking or climbing stairs?: No Weakness of Legs: Both Weakness of Arms/Hands: None  Permission Sought/Granted Permission sought to share information with : Family Supports, Other (comment) Permission granted to share information with : Yes, Verbal Permission Granted  Share Information with NAME: Bryon Lions  Permission granted to share info w AGENCY: [facility]  Permission granted to share info w Relationship:  sister  Permission granted to share info w  Contact Information: [phone]  Emotional Assessment Appearance:: Other (Comment Required(telephonic assessment with pt sister) Attitude/Demeanor/Rapport: (telephonic assessment with pt sister) Affect (typically observed): (telephonic assessment with pt sister) Orientation: : Oriented to Self, Oriented to Place, Oriented to  Time, Fluctuating Orientation (Suspected and/or reported Sundowners) Alcohol / Substance Use: Not Applicable Psych Involvement: No (comment)  Admission diagnosis:  LEFT BREAST CANCER Patient Active Problem List   Diagnosis Date Noted  . Cancer of left female breast  ([place]) 09/10/2019  . Malignant neoplasm of overlapping sites of left breast in female, estrogen receptor positive ([place]) 11/13/2018  . Varicose veins of leg with pain, right 06/23/2018  . Advanced care planning/counseling discussion 02/07/2017  . Osteoarthritis of left knee 11/05/2016  . Loss of weight 06/01/2016  . Depression due to stroke 06/01/2016  . Vascular dementia ([place]) 06/01/2016  . Ductal carcinoma in situ (DCIS) of left breast 05/21/2016  . Ataxia, late effect of cerebrovascular disease 04/23/2014  . Right pontine stroke ([place]) 04/18/2014  . Hypothyroidism, postsurgical 04/18/2014  . Dysphagia as late effect of stroke 04/18/2014  . Essential hypertension, benign 04/18/2014  . Constipation, slow transit 04/18/2014  . Thrombotic cerebral infarction ([place]) 03/08/2014  . Senile osteoporosis   . Hyperlipidemia LDL goal < 100    PCP:  Gayland Curry, DO Pharmacy:   [id number] [address], [place] [address] Phone: [phone] Fax: [phone]  [facility] [place], [place] [address] Phone: [phone] Fax: [phone]     Social Determinants of Health (SDOH) Interventions    Readmission Risk Interventions No flowsheet data found.

## 2019-09-12 DIAGNOSIS — I10 Essential (primary) hypertension: Secondary | ICD-10-CM | POA: Diagnosis not present

## 2019-09-12 DIAGNOSIS — E039 Hypothyroidism, unspecified: Secondary | ICD-10-CM | POA: Diagnosis not present

## 2019-09-12 DIAGNOSIS — C773 Secondary and unspecified malignant neoplasm of axilla and upper limb lymph nodes: Secondary | ICD-10-CM | POA: Diagnosis not present

## 2019-09-12 DIAGNOSIS — G309 Alzheimer's disease, unspecified: Secondary | ICD-10-CM | POA: Diagnosis not present

## 2019-09-12 DIAGNOSIS — F329 Major depressive disorder, single episode, unspecified: Secondary | ICD-10-CM | POA: Diagnosis not present

## 2019-09-12 DIAGNOSIS — C50112 Malignant neoplasm of central portion of left female breast: Secondary | ICD-10-CM | POA: Diagnosis not present

## 2019-09-12 LAB — URINALYSIS, ROUTINE W REFLEX MICROSCOPIC
Bilirubin Urine: NEGATIVE
Glucose, UA: NEGATIVE mg/dL
Hgb urine dipstick: NEGATIVE
Ketones, ur: NEGATIVE mg/dL
Leukocytes,Ua: NEGATIVE
Nitrite: NEGATIVE
Protein, ur: NEGATIVE mg/dL
Specific Gravity, Urine: 1.012 (ref 1.005–1.030)
pH: 6 (ref 5.0–8.0)

## 2019-09-12 MED ORDER — ACETAMINOPHEN 325 MG PO TABS: 650.0000 mg | ORAL_TABLET | Freq: Four times a day (QID) | ORAL | Status: DC | PRN

## 2019-09-12 NOTE — Discharge Instructions (Signed)
[facility] °[phone] ° °MASTECTOMY: POST OP INSTRUCTIONS ° °Always review your discharge instruction sheet given to you by the facility where your surgery was performed. °IF YOU HAVE DISABILITY OR FAMILY LEAVE FORMS, YOU MUST BRING THEM TO THE OFFICE FOR PROCESSING.   °DO NOT GIVE THEM TO YOUR DOCTOR. °A prescription for pain medication may be given to you upon discharge.  Take your pain medication as prescribed, if needed.  If narcotic pain medicine is not needed, then you may take acetaminophen (Tylenol) or ibuprofen (Advil) as needed. °1. Take your usually prescribed medications unless otherwise directed. °2. If you need a refill on your pain medication, please contact your pharmacy.  They will contact our office to request authorization.  Prescriptions will not be filled after 5pm or on week-ends. °3. You should follow a light diet the first few days after arrival home, such as soup and crackers, etc.  Resume your normal diet the day after surgery. °4. Most patients will experience some swelling and bruising on the chest and underarm.  Ice packs will help.  Swelling and bruising can take several days to resolve.  °5. It is common to experience some constipation if taking pain medication after surgery.  Increasing fluid intake and taking a stool softener (such as Colace) will usually help or prevent this problem from occurring.  A mild laxative (Milk of Magnesia or Miralax) should be taken according to package instructions if there are no bowel movements after 48 hours. °6. Unless discharge instructions indicate otherwise, leave your bandage dry and in place until your next appointment in 3-5 days.  You may take a limited sponge bath.  No tube baths or showers until the drains are removed.  You may have steri-strips (small skin tapes) in place directly over the incision.  These strips should be left on the skin for 7-10 days.  If your surgeon used skin glue on the incision, you may  shower in 24 hours.  The glue will flake off over the next 2-3 weeks.  Any sutures or staples will be removed at the office during your follow-up visit. °7. DRAINS:  If you have drains in place, it is important to keep a list of the amount of drainage produced each day in your drains.  Before leaving the hospital, you should be instructed on drain care.  Call our office if you have any questions about your drains. °8. ACTIVITIES:  You may resume regular (light) daily activities beginning the next day--such as daily self-care, walking, climbing stairs--gradually increasing activities as tolerated.  You may have sexual intercourse when it is comfortable.  Refrain from any heavy lifting or straining until approved by your doctor. °a. You may drive when you are no longer taking prescription pain medication, you can comfortably wear a seatbelt, and you can safely maneuver your car and apply brakes. °b. RETURN TO WORK:  __________________________________________________________ °9. You should see your doctor in the office for a follow-up appointment approximately 3-5 days after your surgery.  Your doctor’s nurse will typically make your follow-up appointment when she calls you with your pathology report.  Expect your pathology report 2-3 business days after your surgery.  You may call to check if you do not hear from us after three days.   °10. OTHER INSTRUCTIONS: ______________________________________________________________________________________________ ____________________________________________________________________________________________ °WHEN TO CALL YOUR DOCTOR: °1. Fever over 101.0 °2. Nausea and/or vomiting °3. Extreme swelling or bruising °4. Continued bleeding from incision. °5. Increased pain, redness, or drainage from the incision. °  The clinic staff is available to answer your questions during regular business hours.  Please dont hesitate to call and ask to speak to one of the nurses for clinical  concerns.  If you have a medical emergency, go to the nearest emergency room or call 911.  A surgeon from [facility] is always on call at the hospital. [address] [place], [place], [place]  [address] ? [address], [place], [place]   [phone] ? [phone] ? FAX [phone] Web site: [place] this sheet to all of your post-operative appointments while you have your drains.  Please measure your drains by CC's or ML's.  Make sure you drain and measure your JP Drains 3 times per day.  At the end of each day, add up totals for the left side and add up totals for the right side.    ( 9 am )     ( 3 pm )        ( 9 pm )                Date L  R  L  R  L  R  Total L/R                                                                                                                                                                                       Surgical Drain Home Care Surgical drains are used to remove extra fluid that normally builds up in a surgical wound after surgery. A surgical drain helps to heal a surgical wound. Different kinds of surgical drains include:  Active drains. These drains use suction to pull drainage away from the surgical wound. Drainage flows through a tube to a container outside of the body. With these drains, you need to keep the bulb or the drainage container flat (compressed) at all times, except while you empty it. Flattening the bulb or container creates suction.  Passive drains. These drains allow fluid to drain naturally, by gravity. Drainage flows through a tube to a bandage (dressing) or a container outside of the body. Passive drains do not need to be emptied. A drain is placed during surgery. Right after surgery, drainage is usually bright red and a little thicker than water. The drainage may gradually turn yellow or pink and become thinner. It is likely that your health care  provider will remove the drain when the drainage stops or when the amount decreases to 1-2 Tbsp (15-30 mL) during a 24-hour period. Supplies needed:  Tape.  Germ-free cleaning solution (  sterile saline).  Cotton swabs.  Split gauze drain sponge: 4 x 4 inches (10 x 10 cm).  Gauze square: 4 x 4 inches (10 x 10 cm). How to care for your surgical drain Care for your drain as told by your health care provider. This is important to help prevent infection. If your drain is placed at your back, or any other hard-to-reach area, ask another person to assist you in performing the following tasks: General care  Keep the skin around the drain dry and covered with a dressing at all times.  Check your drain area every day for signs of infection. Check for: ? Redness, swelling, or pain. ? Pus or a bad smell. ? Cloudy drainage. ? Tenderness or pressure at the drain exit site. Changing the dressing Follow instructions from your health care provider about how to change your dressing. Change your dressing at least once a day. Change it more often if needed to keep the dressing dry. Make sure you: 1. Gather your supplies. 2. Wash your hands with soap and water before you change your dressing. If soap and water are not available, use hand sanitizer. 3. Remove the old dressing. Avoid using scissors to do that. 4. Wash your hands with soap and water again after removing the old dressing. 5. Use sterile saline to clean your skin around the drain. You may need to use a cotton swab to clean the skin. 6. Place the tube through the slit in a drain sponge. Place the drain sponge so that it covers your wound. 7. Place the gauze square or another drain sponge on top of the drain sponge that is on the wound. Make sure the tube is between those layers. 8. Tape the dressing to your skin. 9. Tape the drainage tube to your skin 1-2 inches (2.5-5 cm) below the place where the tube enters your body. Taping keeps the tube  from pulling on any stitches (sutures) that you have. 10. Wash your hands with soap and water. 11. Write down the color of your drainage and how often you change your dressing. How to empty your active drain  1. Make sure that you have a measuring cup that you can empty your drainage into. 2. Wash your hands with soap and water. If soap and water are not available, use hand sanitizer. 3. Loosen any pins or clips that hold the tube in place. 4. If your health care provider tells you to strip the tube to prevent clots and tube blockages: ? Hold the tube at the skin with one hand. Use your other hand to pinch the tubing with your thumb and first finger. ? Gently move your fingers down the tube while squeezing very lightly. This clears any drainage, clots, or tissue from the tube. ? You may need to do this several times each day to keep the tube clear. Do not pull on the tube. 5. Open the bulb cap or the drain plug. Do not touch the inside of the cap or the bottom of the plug. 6. Turn the device upside down and gently squeeze. 7. Empty all of the drainage into the measuring cup. 8. Compress the bulb or the container and replace the cap or the plug. To compress the bulb or the container, squeeze it firmly in the middle while you close the cap or plug the container. 9. Write down the amount of drainage that you have in each 24-hour period. If you have less than 2 Tbsp (30 mL) of  drainage during 24 hours, contact your health care provider. 10. Flush the drainage down the toilet. 11. Wash your hands with soap and water. Contact a health care provider if:  You have redness, swelling, or pain around your drain area.  You have pus or a bad smell coming from your drain area.  You have a fever or chills.  The skin around your drain is warm to the touch.  The amount of drainage that you have is increasing instead of decreasing.  You have drainage that is cloudy.  There is a sudden stop or a sudden  decrease in the amount of drainage that you have.  Your drain tube falls out.  Your active drain does not stay compressed after you empty it. Summary  Surgical drains are used to remove extra fluid that normally builds up in a surgical wound after surgery.  Different kinds of surgical drains include active drains and passive drains. Active drains use suction to pull drainage away from the surgical wound, and passive drains allow fluid to drain naturally.  It is important to care for your drain to prevent infection. If your drain is placed at your back, or any other hard-to-reach area, ask another person to assist you.  Contact your health care provider if you have redness, swelling, or pain around your drain area. This information is not intended to replace advice given to you by your health care provider. Make sure you discuss any questions you have with your health care provider. Document Released: 12/14/2000 Document Revised: 01/21/2019 Document Reviewed: 01/21/2019 Elsevier Patient Education  2020 [employer].

## 2019-09-12 NOTE — Plan of Care (Signed)
  Problem: Education: Goal: Knowledge of disease or condition will improve Outcome: Progressing   Problem: Activity: Goal: Ability to maintain or regain function will improve Outcome: Progressing   Problem: Self-Concept: Goal: Ability to verbalize positive feelings about self will improve Outcome: Progressing   Problem: Skin Integrity: Goal: Demonstration of wound healing without infection will improve Outcome: Progressing

## 2019-09-12 NOTE — Progress Notes (Signed)
Called patients sister to follow up and she will be in later today .

## 2019-09-12 NOTE — TOC Transition Note (Signed)
Transition of Care [facility]) - CM/SW Discharge Note   Patient Details  Name: Shelia Marks MRN: [id number] Date of Birth: 1926-06-03  Transition of Care [facility]) CM/SW Contact:  Claudie Leach, RN Phone Number: 09/12/2019, 10:42 AM   Clinical Narrative:    Pt to discharge to sister's home at [facility] [address] [place]ta Kocher, [phone].  Per sister, Shelia Marks, patient has a RW at home but needs a 3n1.  Shelia Marks is concerned about drain care and insists that she cannot have patient home until home health services start.  Armond Hang that she will need to learn drain care as home health RN will not be available to empty drains each time.  D/W RN, who will attempt to do education on drain care with her today and tomorrow.  D/W Tommi Rumps at [place].  Plan is for aides to start tomorrow.  Shelia Marks says she needs aides to be there 4-6 hours/ day.  Alvis Lemmings will try to have RN visit tomorrow as well as aides will not be able to help with drains.    PA, Creig Hines aware and agreeable to plan.     Final next level of care: Home w Home Health Services Barriers to Discharge: Family Issues   Patient Goals and CMS Choice Patient states their goals for this hospitalization and ongoing recovery are:: for her to have help with the drains CMS Medicare.gov Compare Post Acute Care list provided to:: Patient Represenative (must comment)(pt sibling Shelia Marks) Choice offered to / list presented to : Sibling  Discharge Plan and Services In-house Referral: Clinical Social Work Discharge Planning Services: CM Consult Post Acute Care Choice: Home Health, Durable Medical Equipment          DME Arranged: 3-N-1 DME Agency: [employer] Date DME Agency Contacted: 09/12/19 Time DME Agency Contacted: [phone] Representative spoke with at DME Agency: [place]: RN, PT, OT, Nurse's Aide [place] Agency: [place] Date [place]: 09/12/19 Time [place]: 1042 Representative spoke with at [place]: Tommi Rumps

## 2019-09-12 NOTE — Progress Notes (Addendum)
2 Days Post-Op    CC: Left breast cancer  Subjective: Patient is alert but seems a bit confused.  She is not sure about her plans for going home.  She says her medicines makes her silly and her sister has to handle those.  Her left breast mastectomy site looks good.  The drain is serosanguineous.  She needs encouragement and instruction to do her incentive spirometry, but she can move about 700 after a few tries.  Complains of urinary incontinence, both before and after admission.  Objective: Vital signs in last 24 hours: Temp:  [97.5 F (36.4 C)-98.3 F (36.8 C)] 98.3 F (36.8 C) (09/12 0451) Pulse Rate:  [60-66] 66 (09/12 0451) Resp:  [18] 18 (09/12 0451) BP: (135-157)/(78-80) 157/80 (09/12 0451) SpO2:  [97 %-100 %] 97 % (09/12 0451) Weight:  [71.2 kg] 71.2 kg (09/11 0948) Last BM Date: 09/09/19 To 40 p.o. 1228 IV Voided x5 Drain 385 No BM recorded. Afebrile vital signs are stable No labs  Intake/Output from previous day: 09/11 0701 - 09/12 0700 In: 1468.2 [P.O.:240; I.V.:1228.2] Out: 385 [Drains:385] Intake/Output this shift: Total I/O In: -  Out: 330 [Urine:300; Drains:30]  General appearance: alert, cooperative and no distress Resp: She has some rales in each base.  She can move about 700-750 at most with her incentive spirometry but she needs instruction to do that.  Left breast mastectomy site is clean and dry.  Staple line is intact.  Drainage is serosanguineous. Cardio: regular rate and rhythm, S1, S2 normal, no murmur, click, rub or gallop and Regular. GI: soft, non-tender; bowel sounds normal; no masses,  no organomegaly  Lab Results:  Recent Labs    09/10/19 0930  WBC 4.5  HGB 12.5  HCT 40.6  PLT 189    BMET No results for input(s): NA, K, CL, CO2, GLUCOSE, BUN, CREATININE, CALCIUM in the last 72 hours. PT/INR No results for input(s): LABPROT, INR in the last 72 hours.  No results for input(s): AST, ALT, ALKPHOS, BILITOT, PROT, ALBUMIN in the last  168 hours.   Lipase  No results found for: LIPASE   Medications: . allopurinol  300 mg Oral Daily  . amLODipine  10 mg Oral Daily  . atenolol  25 mg Oral Daily  . atorvastatin  40 mg Oral Daily  . feeding supplement (ENSURE ENLIVE)  237 mL Oral BID BM  . heparin  5,000 Units Subcutaneous Q8H  . influenza vaccine adjuvanted  0.5 mL Intramuscular Tomorrow-1000  . levothyroxine  112 mcg Oral QAC breakfast  . lisinopril  2.5 mg Oral Daily  . multivitamin with minerals  1 tablet Oral Daily  . pantoprazole  40 mg Oral QHS  . sertraline  25 mg Oral Daily   . sodium chloride    . lactated ringers 50 mL/hr at 09/11/19 1702   Anti-infectives (From admission, onward)   Start     Dose/Rate Route Frequency Ordered Stop   09/10/19 0930  ceFAZolin (ANCEF) IVPB 2g/100 mL premix  Status:  Discontinued     2 g 200 mL/hr over 30 Minutes Intravenous To Short Stay 09/10/19 0838 09/10/19 1715     Assessment/Plan Hypertension Alzheimer's disease Hypothyroid/Hx thyroid cancer Hx CVA Hyperlipidemia Gout GERD  Left breast cancer s/p chemotherapy now with multiple masses/lymphadenopathy Left modified radical mastectomy 09/10/2019, Dr. Autumn Messing  FEN: IV fluids/soft diet ID: Ancef preop DVT: Heparin Follow-up with Dr. Marlou Starks  Plan: We will check with case manager and asked them to look into discharge  plans.  Will ask staff to get her up and ambulate her.  PT and OT are recommending rolling walker and a 3 and 1.  Before I order that I would like for the case manager to make sure she does not already have it at home.  Checking UA for complaints of urinary incontinence.  Talked with Case management sister wants to wait till tomorrow when home health can be started.  Will aim for DC tomorrow.    Sister is 89.      LOS: 0 days    Sang Blount 09/12/2019 [phone]

## 2019-09-13 DIAGNOSIS — C773 Secondary and unspecified malignant neoplasm of axilla and upper limb lymph nodes: Secondary | ICD-10-CM | POA: Diagnosis not present

## 2019-09-13 DIAGNOSIS — F329 Major depressive disorder, single episode, unspecified: Secondary | ICD-10-CM | POA: Diagnosis not present

## 2019-09-13 DIAGNOSIS — Z85820 Personal history of malignant melanoma of skin: Secondary | ICD-10-CM | POA: Diagnosis not present

## 2019-09-13 DIAGNOSIS — Z9181 History of falling: Secondary | ICD-10-CM | POA: Diagnosis not present

## 2019-09-13 DIAGNOSIS — K5900 Constipation, unspecified: Secondary | ICD-10-CM

## 2019-09-13 DIAGNOSIS — Z483 Aftercare following surgery for neoplasm: Secondary | ICD-10-CM | POA: Diagnosis not present

## 2019-09-13 DIAGNOSIS — Z79891 Long term (current) use of opiate analgesic: Secondary | ICD-10-CM | POA: Diagnosis not present

## 2019-09-13 DIAGNOSIS — Z96659 Presence of unspecified artificial knee joint: Secondary | ICD-10-CM | POA: Diagnosis not present

## 2019-09-13 DIAGNOSIS — M810 Age-related osteoporosis without current pathological fracture: Secondary | ICD-10-CM | POA: Diagnosis not present

## 2019-09-13 DIAGNOSIS — F0280 Dementia in other diseases classified elsewhere without behavioral disturbance: Secondary | ICD-10-CM | POA: Diagnosis not present

## 2019-09-13 DIAGNOSIS — Z7983 Long term (current) use of bisphosphonates: Secondary | ICD-10-CM | POA: Diagnosis not present

## 2019-09-13 DIAGNOSIS — I10 Essential (primary) hypertension: Secondary | ICD-10-CM

## 2019-09-13 DIAGNOSIS — Z7982 Long term (current) use of aspirin: Secondary | ICD-10-CM | POA: Diagnosis not present

## 2019-09-13 DIAGNOSIS — M109 Gout, unspecified: Secondary | ICD-10-CM | POA: Diagnosis not present

## 2019-09-13 DIAGNOSIS — D0512 Intraductal carcinoma in situ of left breast: Secondary | ICD-10-CM | POA: Diagnosis not present

## 2019-09-13 DIAGNOSIS — Z170 Estrogen receptor positive status [ER+]: Secondary | ICD-10-CM | POA: Diagnosis not present

## 2019-09-13 DIAGNOSIS — E890 Postprocedural hypothyroidism: Secondary | ICD-10-CM | POA: Diagnosis not present

## 2019-09-13 DIAGNOSIS — Z9012 Acquired absence of left breast and nipple: Secondary | ICD-10-CM | POA: Diagnosis not present

## 2019-09-13 DIAGNOSIS — R591 Generalized enlarged lymph nodes: Secondary | ICD-10-CM | POA: Diagnosis not present

## 2019-09-13 DIAGNOSIS — I69354 Hemiplegia and hemiparesis following cerebral infarction affecting left non-dominant side: Secondary | ICD-10-CM | POA: Diagnosis not present

## 2019-09-13 DIAGNOSIS — C50112 Malignant neoplasm of central portion of left female breast: Secondary | ICD-10-CM | POA: Diagnosis not present

## 2019-09-13 DIAGNOSIS — K219 Gastro-esophageal reflux disease without esophagitis: Secondary | ICD-10-CM | POA: Diagnosis not present

## 2019-09-13 DIAGNOSIS — E039 Hypothyroidism, unspecified: Secondary | ICD-10-CM | POA: Diagnosis not present

## 2019-09-13 DIAGNOSIS — G309 Alzheimer's disease, unspecified: Secondary | ICD-10-CM | POA: Diagnosis not present

## 2019-09-13 DIAGNOSIS — M150 Primary generalized (osteo)arthritis: Secondary | ICD-10-CM | POA: Diagnosis not present

## 2019-09-13 MED ORDER — ACETAMINOPHEN 325 MG PO TABS: 650.0000 mg | ORAL_TABLET | Freq: Four times a day (QID) | ORAL | Status: AC | PRN

## 2019-09-13 MED ORDER — HYDROCODONE-ACETAMINOPHEN 5-325 MG PO TABS: 1.0000 | ORAL_TABLET | ORAL | 0 refills | Status: DC | PRN

## 2019-09-13 NOTE — Plan of Care (Signed)
  Problem: Education: Goal: Knowledge of disease or condition will improve Outcome: Completed/Met   Problem: Activity: Goal: Ability to maintain or regain function will improve Outcome: Completed/Met   Problem: Clinical Measurements: Goal: Postoperative complications will be avoided or minimized Outcome: Completed/Met   Problem: Self-Concept: Goal: Ability to verbalize positive feelings about self will improve Outcome: Completed/Met   Problem: Pain Management: Goal: Expressions of feelings of enhanced comfort will increase Outcome: Completed/Met   Problem: Skin Integrity: Goal: Demonstration of wound healing without infection will improve Outcome: Completed/Met

## 2019-09-13 NOTE — Progress Notes (Signed)
[name] to be D/C'd  per MD order. Discussed with the patient and all questions fully answered.  VSS, Skin clean, dry and intact without evidence of skin break down, no evidence of skin tears noted.  IV catheter discontinued intact. Site without signs and symptoms of complications. Dressing and pressure applied.  An After Visit Summary was printed and given to the patient and patient sister. Jp education given to sister, sister was able to drain Jp and milked the tubing prior to discharge.  D/c education completed with patient/family including follow up instructions, medication list, d/c activities limitations if indicated, with other d/c instructions as indicated by MD - patient able to verbalize understanding, all questions fully answered.   Patient instructed to return to ED, call 911, or call MD for any changes in condition.   Patient to be escorted via [place], and D/C home via private auto.

## 2019-09-13 NOTE — Progress Notes (Signed)
3 Days Post-Op    CC: Left breast cancer  Subjective: She is okay this AM.  She says she was up walking some yesterday.  Home health has been arranged and will plan discharge this a.m.  Objective: Vital signs in last 24 hours: Temp:  [98 F (36.7 C)-99 F (37.2 C)] 99 F (37.2 C) (09/13 0534) Pulse Rate:  [53-63] 63 (09/13 0534) Resp:  [16-19] 17 (09/13 0534) BP: (131-153)/(67-79) 153/79 (09/13 0534) SpO2:  [99 %-100 %] 100 % (09/13 0534) Last BM Date: 09/09/19 580 p.o. 400 urine 250 from the drain Afebrile vital signs are stable No labs Intake/Output from previous day: 09/12 0701 - 09/13 0700 In: 66 [P.O.:580] Out: 650 [Urine:400; Drains:250] Intake/Output this shift: Total I/O In: 100 [P.O.:100] Out: 70 [Drains:70]  General appearance: alert, cooperative and no distress Resp: clear to auscultation bilaterally Breasts: normal appearance, no masses or tenderness, Suture line for the mastectomy is clean and dry healing nicely.  The drain is leaking some around the insertion site will have staff clean that and redress. GI: soft, non-tender; bowel sounds normal; no masses,  no organomegaly  Lab Results:  No results for input(s): WBC, HGB, HCT, PLT in the last 72 hours.  BMET No results for input(s): NA, K, CL, CO2, GLUCOSE, BUN, CREATININE, CALCIUM in the last 72 hours. PT/INR No results for input(s): LABPROT, INR in the last 72 hours.  No results for input(s): AST, ALT, ALKPHOS, BILITOT, PROT, ALBUMIN in the last 168 hours.   Lipase  No results found for: LIPASE   Medications: . allopurinol  300 mg Oral Daily  . amLODipine  10 mg Oral Daily  . atenolol  25 mg Oral Daily  . atorvastatin  40 mg Oral Daily  . feeding supplement (ENSURE ENLIVE)  237 mL Oral BID BM  . heparin  5,000 Units Subcutaneous Q8H  . influenza vaccine adjuvanted  0.5 mL Intramuscular Tomorrow-1000  . levothyroxine  112 mcg Oral QAC breakfast  . lisinopril  2.5 mg Oral Daily  .  multivitamin with minerals  1 tablet Oral Daily  . pantoprazole  40 mg Oral QHS  . sertraline  25 mg Oral Daily    Assessment/Plan Hypertension Alzheimer's disease Hypothyroid/Hx thyroid cancer Hx CVA Hyperlipidemia Gout GERD  Left breast cancer s/p chemotherapy now with multiple masses/lymphadenopathy Left modified radical mastectomy 09/10/2019, Dr. Autumn Messing  FEN: IV fluids/soft diet ID: Ancef preop DVT: Heparin Follow-up with Dr. Marlou Starks  Plan: Home today with follow-up with Dr. Marlou Starks next week in the office.  Case manager discussed with sistere and she will pick her up before noon.      LOS: 0 days    Dorlis Judice 09/13/2019 [phone]

## 2019-09-13 NOTE — TOC Transition Note (Signed)
Transition of Care [facility]) - CM/SW Discharge Note   Patient Details  Name: Shelia Marks MRN: [id number] Date of Birth: [birth year]  [facility]) CM/SW Contact:  Claudie Leach, RN Phone Number: 09/13/2019, 10:43 AM   Clinical Narrative:    Pt to d/c home this am with [facility] program.  Per Tommi Rumps, liaison for [place],  RN scheduled to visit at 2pm with home aides scheduled for 4-8 hours/day as requested by sister.  Sister and RN relay that sister came into hospital yesterday to learn management of JP drains and sister feels comfortable.  Discussed d/c with sister and RN to be completed prior to 12:00 so that patient will be home for the RN visit.     Final next level of care: Home w Home Health Services Barriers to Discharge: Family Issues   Patient Goals and CMS Choice Patient states their goals for this hospitalization and ongoing recovery are:: for her to have help with the drains CMS Medicare.gov Compare Post Acute Care list provided to:: Patient Represenative (must comment)(pt sibling [name]) Choice offered to / list presented to : Sibling   Discharge Plan and Services In-house Referral: Clinical Social Work Discharge Planning Services: CM Consult Post Acute Care Choice: Home Health, Durable Medical Equipment          DME Arranged: 3-N-1 DME Agency: [employer] Date DME Agency Contacted: 09/12/19 Time DME Agency Contacted: [phone] Representative spoke with at DME Agency: [place]: RN, PT, OT, Nurse's Aide [place] Agency: [place] Date [place]: 09/12/19 Time [place]: 1042 Representative spoke with at [place]: Tommi Rumps

## 2019-09-16 NOTE — Discharge Summary (Signed)
Physician Discharge Summary  Patient ID: Shelia Marks MRN: [id number] DOB/AGE: [birth year] 85 y.o.  Admit date: 09/10/2019 Discharge date: 09/16/2019  Admission Diagnoses:  Discharge Diagnoses:  Active Problems:   Cancer of left female breast  [facility])   Discharged Condition: good  Hospital Course: the pt underwent mastectomy. She tolerated surgery well. She and her family felt unable to empty her drain so arrangements were made for her to go to a snf and then she was discharged  Consults: None  Significant Diagnostic Studies: none  Treatments: surgery: as above  Discharge Exam: Blood pressure (!) 153/79, pulse 63, temperature 99 F (37.2 C), temperature source Oral, resp. rate 17, height 5\' 2"  (1.575 m), weight 71.2 kg, SpO2 100 %. General appearance: alert and cooperative Resp: clear to auscultation bilaterally Chest wall: skin flaps look good Cardio: regular rate and rhythm GI: soft, non-tender; bowel sounds normal; no masses,  no organomegaly  Disposition:    Allergies as of 09/13/2019   No Known Allergies     Medication List    TAKE these medications   acetaminophen 325 MG tablet Commonly known as: TYLENOL Take 2 tablets (650 mg total) by mouth every 6 (six) hours as needed for mild pain, moderate pain, fever or headache.   alendronate 70 MG tablet Commonly known as: FOSAMAX TAKE 1 TABLET (70 MG TOTAL) BY MOUTH EVERY 7 (SEVEN) DAYS. TAKE WITH A FULL GLASS OF WATER ON AN EMPTY STOMACH.   allopurinol 300 MG tablet Commonly known as: ZYLOPRIM TAKE 1 TABLET EVERY DAY   amLODipine 10 MG tablet Commonly known as: NORVASC TAKE 1 TABLET EVERY DAY Notes to patient: Start tomorrow   aspirin EC 81 MG tablet Take 1 tablet (81 mg total) by mouth daily. Notes to patient: Start tomorrow   atenolol 25 MG tablet Commonly known as: TENORMIN TAKE 1 TABLET EVERY DAY Notes to patient: Start tomorrow   atorvastatin 40 MG tablet Commonly known as: LIPITOR TAKE 1  TABLET EVERY DAY Notes to patient: Start tomorrow   calcium carbonate 500 MG chewable tablet Commonly known as: TUMS - dosed in mg elemental calcium Chew 1 tablet by mouth as needed for indigestion or heartburn.   HYDROcodone-acetaminophen 5-325 MG tablet Commonly known as: NORCO/VICODIN Take 1-2 tablets by mouth every 4 (four) hours as needed for severe pain (pain not relieved by plain Tylenol).   hydrOXYzine 10 MG tablet Commonly known as: ATARAX/VISTARIL Take 1 tablet (10 mg total) by mouth 3 (three) times daily as needed.   iron polysaccharides 150 MG capsule Commonly known as: NIFEREX Take 1 capsule (150 mg total) by mouth every other day. Notes to patient: Whatever you do at home   levothyroxine 112 MCG tablet Commonly known as: SYNTHROID Take 112 mcg by mouth daily before breakfast. Notes to patient: Start tomorrow   lisinopril 2.5 MG tablet Commonly known as: ZESTRIL TAKE 1 TABLET DAILY TO CONTROL BLOOD PRESSURE Notes to patient: Start tomorrow   magnesium hydroxide 400 MG/5ML suspension Commonly known as: MILK OF MAGNESIA Take 5-10 mLs by mouth daily as needed for mild constipation.   sertraline 50 MG tablet Commonly known as: ZOLOFT TAKE ONE TABLET DAILY FOR DEPRESSION Notes to patient: Start tomorrow   Vitamin D 50 MCG (2000 UT) Caps Take 1 capsule by mouth 3 (three) times a week. Notes to patient: Whatever you do at home      Follow-up Information    Jovita Kussmaul, MD Follow up.   Specialty: General Surgery Why: call for  follow up next week, bring in info on drain output. Contact information: [place] [address] [phone]           Signed: Autumn Messing III 09/16/2019, 3:20 PM

## 2019-09-17 ENCOUNTER — Telehealth: Payer: Self-pay | Admitting: *Deleted

## 2019-09-17 NOTE — Telephone Encounter (Signed)
Patient is at [facility], [name]'s home.  TOC Completed and appointment scheduled.

## 2019-09-17 NOTE — Telephone Encounter (Signed)
Gayland Curry, DO  Jezabel Lecker A, CMA        Unclear from notes if pt actually went home with sister as toc says vs went to snf as d/c summary indicates.

## 2019-09-17 NOTE — Telephone Encounter (Signed)
Transition Care Management Follow-up Telephone Call  Date of discharge and from where: 09/13/2019 [place]   How have you been since you were released from the hospital? Better  Any questions or concerns? No   Items Reviewed:  Did the pt receive and understand the discharge instructions provided? Yes   Medications obtained and verified? Yes   Any new allergies since your discharge? No   Dietary orders reviewed? Yes  Do you have support at home? Yes  [name], sister  Other (ie: DME, Home Health, etc) No  Functional Questionnaire: (I = Independent and D = Dependent) ADL's: I with assistance  Bathing/Dressing- I with assistance   Meal Prep- D  Eating- I  Maintaining continence- I  Transferring/Ambulation- I  Managing Meds- I   Follow up appointments reviewed:    PCP Hospital f/u appt confirmed? Yes  Scheduled to see Dr. Mariea Clonts on 9/21 .  [facility] f/u appt confirmed? Yes  Scheduled to see General Surgeon next week  Are transportation arrangements needed? No   If their condition worsens, is the pt aware to call  their PCP or go to the ED? Yes  Was the patient provided with contact information for the PCP's office or ED? Yes  Was the pt encouraged to call back with questions or concerns? Yes

## 2019-09-18 ENCOUNTER — Other Ambulatory Visit: Payer: Self-pay | Admitting: Internal Medicine

## 2019-09-21 ENCOUNTER — Encounter: Payer: Self-pay | Admitting: Internal Medicine

## 2019-09-21 ENCOUNTER — Other Ambulatory Visit: Payer: Self-pay

## 2019-09-21 ENCOUNTER — Ambulatory Visit (INDEPENDENT_AMBULATORY_CARE_PROVIDER_SITE_OTHER): Payer: Medicare Other | Admitting: Internal Medicine

## 2019-09-21 DIAGNOSIS — Z9012 Acquired absence of left breast and nipple: Secondary | ICD-10-CM | POA: Diagnosis not present

## 2019-09-21 DIAGNOSIS — C50812 Malignant neoplasm of overlapping sites of left female breast: Secondary | ICD-10-CM | POA: Diagnosis not present

## 2019-09-21 DIAGNOSIS — Z170 Estrogen receptor positive status [ER+]: Secondary | ICD-10-CM

## 2019-09-21 DIAGNOSIS — F0150 Vascular dementia without behavioral disturbance: Secondary | ICD-10-CM

## 2019-09-21 DIAGNOSIS — K5901 Slow transit constipation: Secondary | ICD-10-CM

## 2019-09-21 NOTE — Progress Notes (Signed)
This service is provided via telemedicine  No vital signs collected/recorded due to the encounter was a telemedicine visit.   Location of patient (ex: home, work):  Home  Patient consents to a telephone visit:  Yes  Location of the provider (ex: office, home):  Office  Name of any referring provider:  Dr. Mariea Clonts  Names of all persons participating in the telemedicine service and their role in the encounter:  Hollace Kinnier, DO; Bonney Leitz, [place]; [name], [name] (her sister)  Time spent on call:  6.39 CMA only time.   -  Location:  [facility] Provider: Sharhonda Atwood L. Mariea Clonts, D.O., C.M.D.  Code Status: DNR Goals of Care:  Advanced Directives 09/21/2019  Does Patient Have a Medical Advance Directive? Yes  Type of Advance Directive Out of facility DNR (pink MOST or yellow form)  Does patient want to make changes to medical advance directive? No - Patient declined  Copy of [place] in Chart? -  Would patient like information on creating a medical advance directive? No - Patient declined  Pre-existing out of facility DNR order (yellow form or pink MOST form) Pink MOST form placed in chart (order not valid for inpatient use);Yellow form placed in chart (order not valid for inpatient use)     Chief Complaint  Patient presents with  . Transitions Of Care    Televisit    HPI: Patient is a 85 y.o. female spoken with today for hospital follow-up s/p admission 09/10/19 for left mastectomy, modified radical with Dr. Marlou Starks.  She's gone home with her sister, [name], who was taught how to empty the drains.    She feels pretty good.  PT is there with her now.  She does not hurt, she's just sore just above the breast.  Thinks she slept on her side wrong.  Slept ok, not good.  No BM today.  Did go yesterday.  Just sitting around today.  She's eating really good.  She took one tablet of pain medicine (norco) this am.  She only has a small amount of drainage--only 20-30cc.  She  has taken some tylenol for pain per [name].  Sees Dr. Jana Hakim Wed and Dr. Randa Lynn.  She does have just a little swelling per the RN with home care.    Past Medical History:  Diagnosis Date  . Alzheimer's disease ([place])   . Cancer ([place])   . Disorder of bone and cartilage, unspecified   . Dizziness and giddiness   . Hyperlipidemia LDL goal < 100   . Hypertension   . Hypothyroidism   . Malignant neoplasm of thyroid gland ([place])   . Nontoxic uninodular goiter   . Other malaise and fatigue   . Other symptoms involving cardiovascular system   . Phlebitis and thrombophlebitis of unspecified site   . Senile osteoporosis   . Sleep related leg cramps   . Stroke ([place])   . Unspecified disorder of lipoid metabolism   . Unspecified hereditary and idiopathic peripheral neuropathy   . Varicose veins of lower extremities with inflammation     Past Surgical History:  Procedure Laterality Date  . ABDOMINAL HYSTERECTOMY     DR HUGES  . BREAST BIOPSY Left 05/21/2016   malignant  . CATRACT SURGERY  2008  . COLON SURGERY     clipped polyp during colonoscopy  . KNEE SURGERY     right  . MASTECTOMY MODIFIED RADICAL Left 09/10/2019   Procedure: LEFT MASTECTOMY MODIFIED RADICAL;  Surgeon: Autumn Messing  III, MD;  Location: [facility];  Service: General;  Laterality: Left;  . PARATHYROID ADENOMA REMOVED  1983  . THYROID SURGERY  July 20, 2011    No Known Allergies  Outpatient Encounter Medications as of 09/21/2019  Medication Sig  . acetaminophen (TYLENOL) 325 MG tablet Take 2 tablets (650 mg total) by mouth every 6 (six) hours as needed for mild pain, moderate pain, fever or headache.  . alendronate (FOSAMAX) 70 MG tablet TAKE 1 TABLET (70 MG TOTAL) BY MOUTH EVERY 7 (SEVEN) DAYS. TAKE WITH A FULL GLASS OF WATER ON AN EMPTY STOMACH.  Marland Kitchen allopurinol (ZYLOPRIM) 300 MG tablet TAKE 1 TABLET EVERY DAY  . amLODipine (NORVASC) 10 MG tablet TAKE 1 TABLET EVERY DAY  . aspirin EC 81 MG tablet Take 1 tablet (81 mg  total) by mouth daily.  Marland Kitchen atenolol (TENORMIN) 25 MG tablet TAKE 1 TABLET EVERY DAY  . atorvastatin (LIPITOR) 40 MG tablet TAKE 1 TABLET EVERY DAY  . calcium carbonate (TUMS - DOSED IN MG ELEMENTAL CALCIUM) 500 MG chewable tablet Chew 1 tablet by mouth as needed for indigestion or heartburn.  . Cholecalciferol (VITAMIN D) 50 MCG (2000 UT) CAPS Take 1 capsule by mouth 3 (three) times a week.  Marland Kitchen HYDROcodone-acetaminophen (NORCO/VICODIN) 5-325 MG tablet Take 1-2 tablets by mouth every 4 (four) hours as needed for severe pain (pain not relieved by plain Tylenol).  . hydrOXYzine (ATARAX/VISTARIL) 10 MG tablet Take 1 tablet (10 mg total) by mouth 3 (three) times daily as needed.  . iron polysaccharides (NIFEREX) 150 MG capsule Take 1 capsule (150 mg total) by mouth every other day.  . levothyroxine (SYNTHROID, LEVOTHROID) 112 MCG tablet Take 112 mcg by mouth daily before breakfast.   . lisinopril (ZESTRIL) 2.5 MG tablet TAKE 1 TABLET DAILY TO CONTROL BLOOD PRESSURE  . magnesium hydroxide (MILK OF MAGNESIA) 400 MG/5ML suspension Take 5-10 mLs by mouth daily as needed for mild constipation.  . sertraline (ZOLOFT) 50 MG tablet TAKE ONE TABLET DAILY FOR DEPRESSION   No facility-administered encounter medications on file as of 09/21/2019.     Review of Systems:  Review of Systems  Constitutional: Positive for malaise/fatigue. Negative for chills and fever.  HENT: Positive for hearing loss.   Eyes: Negative for blurred vision.  Respiratory: Negative for shortness of breath.   Cardiovascular: Negative for chest pain, palpitations and leg swelling.  Gastrointestinal: Positive for constipation. Negative for abdominal pain.       Had bm yesterday  Genitourinary: Negative for dysuria.  Musculoskeletal: Negative for falls, joint pain and myalgias.  Skin: Negative for itching and rash.  Neurological: Negative for dizziness.  Psychiatric/Behavioral: Positive for memory loss. Negative for depression. The  patient has insomnia. The patient is not nervous/anxious.        Not sleeping well due to some soreness and positional issues with drain    Health Maintenance  Topic Date Due  . TETANUS/TDAP  06/23/2028  . INFLUENZA VACCINE  Completed  . DEXA SCAN  Completed  . PNA vac Low Risk Adult  Completed   Physical Exam Cannot be performed due to phone visit  Labs reviewed: Basic Metabolic Panel: Recent Labs    03/12/19 1417 08/11/19 1051 09/02/19 1230  NA 143 143 141  K 3.7 3.5 4.3  CL 108 109 106  CO2 27 28 24   GLUCOSE 80 101* 86  BUN 19 22 17   CREATININE 0.93 1.10* 1.06*  CALCIUM 9.3 9.6 9.7   Liver Function Tests: Recent  Labs    02/12/19 1003 03/12/19 1417 08/11/19 1051  AST 27 31 29   ALT 23 29 27   ALKPHOS 67 61 52  BILITOT 0.8 0.7 0.8  PROT 7.7 7.2 7.5  ALBUMIN 3.9 3.7 4.0   No results for input(s): LIPASE, AMYLASE in the last 8760 hours. No results for input(s): AMMONIA in the last 8760 hours. CBC: Recent Labs    02/12/19 1003 03/12/19 1417 08/11/19 1051 09/10/19 0930  WBC 4.2 4.0 4.0 4.5  NEUTROABS 2.4 2.2 2.4  --   HGB 11.6* 10.6* 11.4* 12.5  HCT 38.0 35.0* 35.9* 40.6  MCV 96.2 95.4 94.2 96.0  PLT 184 169 184 189   Lipid Panel: No results for input(s): CHOL, HDL, LDLCALC, TRIG, CHOLHDL, LDLDIRECT in the last 8760 hours. Lab Results  Component Value Date   HGBA1C 5.0 11/09/2015   Review operative note and hospital records.  Assessment/Plan 1. Malignant neoplasm of overlapping sites of left breast in female, estrogen receptor positive ([place]) -s/p mastectomy -has drain in place which shows decreasing output -pain seems to be controlled mostly with tylenol and now with a single hydrocodone this am -has her f/u with Dr. Marlou Starks and Dr. Jana Hakim scheduled -getting PT for strengthening and monitoring of drain by RN, her sister is doing a great job of managing this  2. Vascular dementia without behavioral disturbance (HCC) -moderate, I'm glad she got to  go home with her sister and seems to be doing well--I was concerned about delirium and further cognitive decline if she went to a rehab center  3. Constipation, slow transit -had bm yesterday, has prn MOM  4. S/P mastectomy, left -by Dr. Marlou Starks, recovering well, keep follow-ups as planned and continue with therapy for postop strengthening, iron for anemia  Labs/tests ordered:  No new today Next appt:  10/19/2019--keep as scheduled 21 minutes spent on telephone visit  [place]. Kase Shughart, D.O. [facility] [address], [place] [address] Cell Phone (Mon-Fri 8am-5pm):  [phone] On Call:  [phone] & follow prompts after 5pm & weekends Office Phone:  [phone] Office Fax:  [phone]

## 2019-09-21 NOTE — Addendum Note (Signed)
Addended by: Gayland Curry on: 09/21/2019 04:32 PM   Modules accepted: Level of Service

## 2019-09-21 NOTE — Patient Instructions (Signed)
I'm glad you are recovering well!  Keep up the good work with your therapy.  Looking forward to seeing your next month.

## 2019-09-22 NOTE — Progress Notes (Addendum)
Shelia Marks  [phone] [phone]) [phone]    ID: Shelia Marks DOB: 1926/07/19  MR#: [id number]  WUG#:[id number]  Patient Care Team: Gayland Curry, DO as PCP - General (Geriatric Medicine) Rutherford Guys, MD as Consulting Physician (Ophthalmology) Magrinat, Virgie Dad, MD as Consulting Physician (Oncology) Jovita Kussmaul, MD as Consulting Physician (General Surgery) Jacelyn Pi, MD as Consulting Physician (Endocrinology) OTHER MD:   CHIEF COMPLAINT: Estrogen receptor positive breast cancer  CURRENT TREATMENT: Awaiting definitive surgery   INTERVAL HISTORY: Shelia Marks returns today for follow-up and treatment of her estrogen receptor positive breast cancer.   She continues on fulvestrant. She states these do not hurt. She denies any problems from this.  Since her last visit, she underwent left mastectomy on 09/10/2019 that demonstrated a stage IIIA, T2, N2a, invasive ductal carcinoma, grade 3, margins negative, ER+,PR-, HER-2-, Ki-67 90%, 4 out of 14 lymph nodes positive for macrometastases, 1 out of 14 lymph nodes positive for micrometastases,  CT chest on 08/26/2019 negative for metastatic disease.     REVIEW OF SYSTEMS: Shelia Marks is recovering from her surgery well. She has some swelling at her mastectomy site.  She still has the drain as well.  We talked to her sister Shelia Marks who helps with her transportation, and with taking care of her.  She says that Shelia Marks still has some letrozole left that she just got refilled and it is a 3 months supply.    Shelia Marks is not having any fever, chills, chest pain, palpitations, cough, shortness of breath, bowel/bladder changes, nausea, vomiting, or any other issues today.  A detailed ROS was otherwise non contributory.     BREAST CANCER HISTORY: From the original intake note:  "Shelia Marks" had bilateral screening mammography at the [facility] 06/07/2016 showing calcifications in the left breast. She was  recalled  05/15/2016 for left diagnostic mammography. This showed the breast density to be category be. In the upper outer left breast there was an area of suspicious calcifications measuring 1.7 cm.  Biopsy of this area was obtained 05/21/2016, and showed (S [phone]) ductal carcinoma in situ, high-grade, estrogen receptor 100% positive, progesterone receptor 5% positive, WITH strong staining intensity.  Her subsequent history is as detailed below.   PAST MEDICAL HISTORY: Past Medical History:  Diagnosis Date   Alzheimer's disease ([place])    Cancer ([place])    Disorder of bone and cartilage, unspecified    Dizziness and giddiness    Hyperlipidemia LDL goal < 100    Hypertension    Hypothyroidism    Malignant neoplasm of thyroid gland (HCC)    Nontoxic uninodular goiter    Other malaise and fatigue    Other symptoms involving cardiovascular system    Phlebitis and thrombophlebitis of unspecified site    Senile osteoporosis    Sleep related leg cramps    Stroke ([place])    Unspecified disorder of lipoid metabolism    Unspecified hereditary and idiopathic peripheral neuropathy    Varicose veins of lower extremities with inflammation     PAST SURGICAL HISTORY: Past Surgical History:  Procedure Laterality Date   ABDOMINAL HYSTERECTOMY     DR HUGES   BREAST BIOPSY Left 05/21/2016   malignant   CATRACT SURGERY  2008   COLON SURGERY     clipped polyp during colonoscopy   KNEE SURGERY     right   MASTECTOMY MODIFIED RADICAL Left 09/10/2019   Procedure: LEFT MASTECTOMY MODIFIED RADICAL;  Surgeon: Jovita Kussmaul, MD;  Location: [facility];  Service: General;  Laterality: Left;   PARATHYROID ADENOMA REMOVED  1983   THYROID SURGERY  July 20, 2011    FAMILY HISTORY Family History  Problem Relation Age of Onset   Diabetes Brother    The patient's father died from liver problems in his 1s. The patient's mother died in her 64s from "natural causes". The patient had  no brothers. She had 6 sisters, 2 of whom died from complications of diabetes and one from a ruptured aneurysm. There is no history of breast or ovarian cancer in the family.    GYNECOLOGIC HISTORY:  No LMP recorded. Patient has had a hysterectomy.  Shelia Marks does not remember how she was when she had her first period. She never carried a child to term. She is status post hysterectomy and at least one ovary was removed. She never took hormone replacement.    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    ADVANCED DIRECTIVES: [name], Shelia Marks, is her healthcare power of attorney. Shelia Marks can be reached at [phone].   HEALTH MAINTENANCE: Social History   Tobacco Use   Smoking status: Never Smoker   Smokeless tobacco: Never Used  Substance Use Topics   Alcohol use: No   Drug use: No     No Known Allergies  Current Outpatient Medications  Medication Sig Dispense Refill   acetaminophen (TYLENOL) 325 MG tablet Take 2 tablets (650 mg total) by mouth every 6 (six) hours as needed for mild pain, moderate pain, fever or headache.     alendronate (FOSAMAX) 70 MG tablet TAKE 1 TABLET (70 MG TOTAL) BY MOUTH EVERY 7 (SEVEN) DAYS. TAKE WITH A FULL GLASS OF WATER ON AN EMPTY STOMACH. 12 tablet 1   allopurinol (ZYLOPRIM) 300 MG tablet TAKE 1 TABLET EVERY DAY 90 tablet 1   amLODipine (NORVASC) 10 MG tablet TAKE 1 TABLET EVERY DAY 90 tablet 1   aspirin EC 81 MG tablet Take 1 tablet (81 mg total) by mouth daily. 30 tablet 3   atenolol (TENORMIN) 25 MG tablet TAKE 1 TABLET EVERY DAY 90 tablet 1   atorvastatin (LIPITOR) 40 MG tablet TAKE 1 TABLET EVERY DAY 90 tablet 0   calcium carbonate (TUMS - DOSED IN MG ELEMENTAL CALCIUM) 500 MG chewable tablet Chew 1 tablet by mouth as needed for indigestion or heartburn.     Cholecalciferol (VITAMIN D) 50 MCG (2000 UT) CAPS Take 1 capsule by  mouth 3 (three) times a week.     HYDROcodone-acetaminophen (NORCO/VICODIN) 5-325 MG tablet Take 1-2 tablets by mouth every 4 (four) hours as needed for severe pain (pain not relieved by plain Tylenol). 20 tablet 0   hydrOXYzine (ATARAX/VISTARIL) 10 MG tablet Take 1 tablet (10 mg total) by mouth 3 (three) times daily as needed. 30 tablet 0   iron polysaccharides (NIFEREX) 150 MG capsule Take 1 capsule (150 mg total) by mouth every other day. 15 capsule 3   levothyroxine (SYNTHROID, LEVOTHROID) 112 MCG tablet Take 112 mcg by mouth daily before breakfast.      lisinopril (ZESTRIL) 2.5 MG tablet TAKE 1 TABLET DAILY TO CONTROL BLOOD PRESSURE 90 tablet 1   magnesium hydroxide (MILK OF MAGNESIA) 400 MG/5ML suspension Take 5-10 mLs by mouth daily as needed for mild constipation.     sertraline (ZOLOFT) 50 MG tablet TAKE ONE TABLET DAILY FOR DEPRESSION 90 tablet 1   No current  facility-administered medications for this visit.     OBJECTIVE: Vitals:   09/23/19 1153  BP: (!) 149/62  Pulse: 70  Resp: 18  Temp: 97.8 F (36.6 C)  SpO2: 100%     Body mass index is 29.58 kg/m.    ECOG FS:2 - Symptomatic, <50% confined to bed GENERAL: Patient is a well appearing female in no acute distress HEENT:  Sclerae anicteric.  Oropharynx clear and moist. No ulcerations or evidence of oropharyngeal candidiasis. Neck is supple.  NODES:  No cervical, supraclavicular, or axillary lymphadenopathy palpated.  BREAST EXAM:  S/p left mastectomy no sign of local recurrence, no erythema, drain in place LUNGS:  Clear to auscultation bilaterally.  No wheezes or rhonchi. HEART:  Regular rate and rhythm. No murmur appreciated. ABDOMEN:  Soft, nontender.  Positive, normoactive bowel sounds. No organomegaly palpated. MSK:  No focal spinal tenderness to palpation. Full range of motion bilaterally in the upper extremities. EXTREMITIES:  No peripheral edema.   SKIN:  Clear with no obvious rashes or skin changes. No nail  dyscrasia. NEURO:  Nonfocal. Well oriented.  Appropriate affect.       LAB RESULTS:  CMP     Component Value Date/Time   NA 141 09/02/2019 1230   NA 143 06/01/2016 0958   K 4.3 09/02/2019 1230   CL 106 09/02/2019 1230   CO2 24 09/02/2019 1230   GLUCOSE 86 09/02/2019 1230   BUN 17 09/02/2019 1230   BUN 21 06/01/2016 0958   CREATININE 1.06 (H) 09/02/2019 1230   CREATININE 1.10 (H) 08/11/2019 1051   CREATININE 0.99 (H) 02/25/2018 0937   CALCIUM 9.7 09/02/2019 1230   PROT 7.5 08/11/2019 1051   PROT 6.8 11/09/2015 1013   ALBUMIN 4.0 08/11/2019 1051   ALBUMIN 4.1 11/09/2015 1013   AST 29 08/11/2019 1051   ALT 27 08/11/2019 1051   ALKPHOS 52 08/11/2019 1051   BILITOT 0.8 08/11/2019 1051   GFRNONAA 46 (L) 09/02/2019 1230   GFRNONAA 44 (L) 08/11/2019 1051   GFRNONAA 51 (L) 02/25/2018 0937   GFRAA 54 (L) 09/02/2019 1230   GFRAA 51 (L) 08/11/2019 1051   GFRAA 59 (L) 02/25/2018 0937    INo results found for: SPEP, UPEP  Lab Results  Component Value Date   WBC 5.3 09/23/2019   NEUTROABS 3.2 09/23/2019   HGB 10.1 (L) 09/23/2019   HCT 32.3 (L) 09/23/2019   MCV 96.1 09/23/2019   PLT 181 09/23/2019      Chemistry      Component Value Date/Time   NA 141 09/02/2019 1230   NA 143 06/01/2016 0958   K 4.3 09/02/2019 1230   CL 106 09/02/2019 1230   CO2 24 09/02/2019 1230   BUN 17 09/02/2019 1230   BUN 21 06/01/2016 0958   CREATININE 1.06 (H) 09/02/2019 1230   CREATININE 1.10 (H) 08/11/2019 1051   CREATININE 0.99 (H) 02/25/2018 0937      Component Value Date/Time   CALCIUM 9.7 09/02/2019 1230   ALKPHOS 52 08/11/2019 1051   AST 29 08/11/2019 1051   ALT 27 08/11/2019 1051   BILITOT 0.8 08/11/2019 1051       No results found for: LABCA2  No components found for: [id number]  No results for input(s): INR in the last 168 hours.  Urinalysis    Component Value Date/Time   COLORURINE YELLOW 09/12/2019 [place] 09/12/2019 1413   LABSPEC 1.012  09/12/2019 1413   PHURINE 6.0 09/12/2019 1413   GLUCOSEU NEGATIVE  09/12/2019 [place] 09/12/2019 [place] 09/12/2019 1413   KETONESUR NEGATIVE 09/12/2019 1413   PROTEINUR NEGATIVE 09/12/2019 1413   UROBILINOGEN 0.2 03/05/2014 2055   NITRITE NEGATIVE 09/12/2019 1413   LEUKOCYTESUR NEGATIVE 09/12/2019 1413    STUDIES: Ct Chest W Contrast  Result Date: 08/26/2019 CLINICAL DATA:  Left breast cancer treated with oral chemotherapy. EXAM: CT CHEST WITH CONTRAST TECHNIQUE: Multidetector CT imaging of the chest was performed during intravenous contrast administration. CONTRAST:  32m OMNIPAQUE IOHEXOL 300 MG/ML  SOLN COMPARISON:  CT abdomen pelvis 07/22/2015. FINDINGS: Cardiovascular: Atherosclerotic calcification of the aorta, aortic valve and coronary arteries. Pulmonary arteries are enlarged with marked dilatation of the proximal left pulmonary artery. Heart is enlarged. No pericardial effusion. Mediastinum/Nodes: Thyroidectomy. No pathologically enlarged mediastinal or hilar lymph nodes. Left internal mammary lymph node measures 5 mm (2/59). Left axillary lymph nodes measure up to 8 mm (2/61). No right axillary adenopathy. Esophagus is unremarkable. Lungs/Pleura: 2 mm peripheral left upper lobe nodule (7/62), likely benign. Lungs are otherwise clear. No pleural fluid. Airway is unremarkable. Upper Abdomen: Visualized portions of the liver, gallbladder, adrenal glands, kidneys, spleen, pancreas, stomach and bowel are unremarkable. No upper abdominal adenopathy. Musculoskeletal: Degenerative changes in the spine and shoulders, right greater than left. No worrisome lytic or sclerotic lesions. IMPRESSION: 1. No evidence of metastatic disease. Left internal mammary and left axillary lymph nodes are not considered pathologically enlarged. Continued attention on follow-up exams is warranted. 2.  Aortic atherosclerosis (ICD10-170.0).  Coronary calcification. 3. Enlarged pulmonary  arteries, particularly involving the left pulmonary artery, indicative of pulmonary arterial hypertension. Electronically Signed   By: MLorin PicketM.D.   On: 08/26/2019 08:18     ELIGIBLE FOR AVAILABLE RESEARCH PROTOCOL: no   ASSESSMENT: 85y.o. [place] woman status post left breast upper outer quadrant biopsy 05/21/2016 for ductal carcinoma in situ, high-grade, estrogen and progesterone receptor positive  (1) opted against surgery   (2) started anastrozole neoadjuvantly 06/13/2016, discontinued June 2018 due to osteoporosis concerns  (a)  bone density 11/16/2016 shows a T score of -4.7 (osteoporosis)-on fosamax.   (3) mammography/ultrasonography 06/10/2018 shows evidence of disease progression, possible lymph node extension  (4) letrozole started 06/27/2018   (a) discontinued November 2019 with evidence of progression  (b) restarted 09/2019, to change to Exemestane in 12/2019  (5) fulvestrant started 11/21/2018  (a) mammography and ultrasonography 07/24/2019 shows evidence of disease progression  (6) Left radical mastectomy and node dissection on 09/10/2019: showing a stage IIIA, T2, N2a, invasive ductal carcinoma, grade 3, margins negative, ER+,PR-, HER-2-, Ki-67 90%  (a) 4 out of 14 lymph nodes positive for macrometastases  (b) 1 out of 14 lymph nodes positive for micrometastases  (c) CT chest on 08/26/2019 negative for metastatic disease  (7) Adjuvant radiation pending   PLAN: Ms. ENierenbergmet with Dr. MJana Hakimtoday.  We talked with her sister on the phone during this time.  We reviewed her surgical pathology.  Dr. MJana Hakimreviewed his recommendation for daily radiation therapy to prevent local recurrence.  CEleynawas recommended to restart the Letrozole, and then we will likely change the Letrozole to Exemestane in 3 months.  I put detailed information about this in her AVS.    I placed a referral for CMarceneto see radiation oncology.  She will return in 6 weeks for  f/u with Dr. MJana Hakim  She was recommended to continue with the appropriate pandemic precautions. She knows to call for any questions that may  arise between now and her next appointment.  We are happy to see her sooner if needed.    Wilber Bihari, NP  09/23/19 11:56 AM Medical Oncology and Hematology [facility] [address] [place], [place] [address] Tel. [phone]    Fax. [phone]   ADDENDUM: Shelia Marks did well with her surgery. Normally I would feel anti-estrogens only would be sufficient but her cancer grew through 3 different anti estrogens and I think she will benefit from XRT adjuvantly, even though the need for daily trips to the [facility] is inconvenient (I did let the patient's sister know we have some transportation assistance).  Shelia Marks just received 3 months of anastrozole and she will run through those, after which she will switch to exemestane.  I personally saw this patient and performed a substantive portion of this encounter with the listed APP documented above.   Chauncey Cruel, MD Medical Oncology and Hematology [facility] [address] [place], [place] [address] Tel. [phone]    Fax. [phone]

## 2019-09-23 ENCOUNTER — Encounter: Payer: Self-pay | Admitting: Adult Health

## 2019-09-23 ENCOUNTER — Inpatient Hospital Stay (HOSPITAL_BASED_OUTPATIENT_CLINIC_OR_DEPARTMENT_OTHER): Payer: Medicare Other | Admitting: Adult Health

## 2019-09-23 ENCOUNTER — Other Ambulatory Visit: Payer: Self-pay

## 2019-09-23 ENCOUNTER — Inpatient Hospital Stay: Payer: Medicare Other | Attending: Oncology

## 2019-09-23 VITALS — BP 149/62 | HR 70 | Temp 97.8°F | Resp 18 | Ht 62.0 in | Wt 161.7 lb

## 2019-09-23 DIAGNOSIS — D0512 Intraductal carcinoma in situ of left breast: Secondary | ICD-10-CM

## 2019-09-23 DIAGNOSIS — E039 Hypothyroidism, unspecified: Secondary | ICD-10-CM | POA: Diagnosis not present

## 2019-09-23 DIAGNOSIS — Z7982 Long term (current) use of aspirin: Secondary | ICD-10-CM | POA: Insufficient documentation

## 2019-09-23 DIAGNOSIS — Z8673 Personal history of transient ischemic attack (TIA), and cerebral infarction without residual deficits: Secondary | ICD-10-CM | POA: Diagnosis not present

## 2019-09-23 DIAGNOSIS — F0280 Dementia in other diseases classified elsewhere without behavioral disturbance: Secondary | ICD-10-CM | POA: Insufficient documentation

## 2019-09-23 DIAGNOSIS — Z79811 Long term (current) use of aromatase inhibitors: Secondary | ICD-10-CM | POA: Insufficient documentation

## 2019-09-23 DIAGNOSIS — Z79899 Other long term (current) drug therapy: Secondary | ICD-10-CM | POA: Diagnosis not present

## 2019-09-23 DIAGNOSIS — Z9012 Acquired absence of left breast and nipple: Secondary | ICD-10-CM | POA: Insufficient documentation

## 2019-09-23 DIAGNOSIS — C50812 Malignant neoplasm of overlapping sites of left female breast: Secondary | ICD-10-CM | POA: Diagnosis not present

## 2019-09-23 DIAGNOSIS — Z85850 Personal history of malignant neoplasm of thyroid: Secondary | ICD-10-CM | POA: Diagnosis not present

## 2019-09-23 DIAGNOSIS — E785 Hyperlipidemia, unspecified: Secondary | ICD-10-CM | POA: Diagnosis not present

## 2019-09-23 DIAGNOSIS — Z170 Estrogen receptor positive status [ER+]: Secondary | ICD-10-CM | POA: Insufficient documentation

## 2019-09-23 DIAGNOSIS — C50912 Malignant neoplasm of unspecified site of left female breast: Secondary | ICD-10-CM | POA: Diagnosis present

## 2019-09-23 LAB — COMPREHENSIVE METABOLIC PANEL
ALT: 18 U/L (ref 0–44)
AST: 23 U/L (ref 15–41)
Albumin: 3.5 g/dL (ref 3.5–5.0)
Alkaline Phosphatase: 63 U/L (ref 38–126)
Anion gap: 7 (ref 5–15)
BUN: 19 mg/dL (ref 8–23)
CO2: 25 mmol/L (ref 22–32)
Calcium: 9.4 mg/dL (ref 8.9–10.3)
Chloride: 110 mmol/L (ref 98–111)
Creatinine, Ser: 0.9 mg/dL (ref 0.44–1.00)
GFR calc Af Amer: >60 mL/min (ref >60)
GFR calc non Af Amer: 56 mL/min — ABNORMAL LOW (ref >60)
Glucose, Bld: 90 mg/dL (ref 70–99)
Potassium: 4.3 mmol/L (ref 3.5–5.1)
Sodium: 142 mmol/L (ref 135–145)
Total Bilirubin: 0.6 mg/dL (ref 0.3–1.2)
Total Protein: 6.9 g/dL (ref 6.5–8.1)

## 2019-09-23 LAB — CBC WITH DIFFERENTIAL/PLATELET
Abs Immature Granulocytes: 0.04 10*3/uL (ref 0.00–0.07)
Basophils Absolute: 0 10*3/uL (ref 0.0–0.1)
Basophils Relative: 1 %
Eosinophils Absolute: 0.2 10*3/uL (ref 0.0–0.5)
Eosinophils Relative: 3 %
HCT: 32.3 % — ABNORMAL LOW (ref 36.0–46.0)
Hemoglobin: 10.1 g/dL — ABNORMAL LOW (ref 12.0–15.0)
Immature Granulocytes: 1 %
Lymphocytes Relative: 25 %
Lymphs Abs: 1.3 10*3/uL (ref 0.7–4.0)
MCH: 30.1 pg (ref 26.0–34.0)
MCHC: 31.3 g/dL (ref 30.0–36.0)
MCV: 96.1 fL (ref 80.0–100.0)
Monocytes Absolute: 0.5 10*3/uL (ref 0.1–1.0)
Monocytes Relative: 10 %
Neutro Abs: 3.2 10*3/uL (ref 1.7–7.7)
Neutrophils Relative %: 60 %
Platelets: 181 10*3/uL (ref 150–400)
RBC: 3.36 MIL/uL — ABNORMAL LOW (ref 3.87–5.11)
RDW: 15.4 % (ref 11.5–15.5)
WBC: 5.3 10*3/uL (ref 4.0–10.5)
nRBC: 0 % (ref 0.0–0.2)

## 2019-09-23 NOTE — Patient Instructions (Signed)
Exemestane tablets What is this medicine? EXEMESTANE (ex e MES tane) blocks the production of the hormone estrogen. Some types of breast cancer depend on estrogen to grow, and this medicine can stop tumor growth by blocking estrogen production. This medicine is for the treatment of breast cancer in postmenopausal women only. This medicine may be used for other purposes; ask your health care provider or pharmacist if you have questions. COMMON BRAND NAME(S): Aromasin What should I tell my health care provider before I take this medicine? They need to know if you have any of these conditions:  an unusual or allergic reaction to exemestane, other medicines, foods, dyes, or preservatives  pregnant or trying to get pregnant  breast-feeding How should I use this medicine? Take this medicine by mouth with a glass of water. Follow the directions on the prescription label. Take your doses at regular intervals after a meal. Do not take your medicine more often than directed. Do not stop taking except on the advice of your doctor or health care professional. Contact your pediatrician regarding the use of this medicine in children. Special care may be needed. Overdosage: If you think you have taken too much of this medicine contact a poison control center or emergency room at once. NOTE: This medicine is only for you. Do not share this medicine with others. What if I miss a dose? If you miss a dose, take the next dose as usual. Do not try to make up the missed dose. Do not take double or extra doses. What may interact with this medicine?  certain medicines for seizures like carbamazepine, phenobarbital, phenytoin  rifampin  St. John's Wort This list may not describe all possible interactions. Give your health care provider a list of all the medicines, herbs, non-prescription drugs, or dietary supplements you use. Also tell them if you smoke, drink alcohol, or use illegal drugs. Some items may interact  with your medicine. What should I watch for while using this medicine? Visit your doctor or health care professional for regular checks on your progress. If you experience hot flashes or sweating while taking this medicine, avoid alcohol, smoking and drinks with caffeine. This may help to decrease these side effects. Do not become pregnant while taking this medicine or for 1 month after stopping it. Women should inform their doctor if they wish to become pregnant or think they might be pregnant. Women of child-bearing potential will need to have a negative pregnancy test before starting this medicine. There is a potential for serious side effects to an unborn child. Do not breast-feed an infant while taking this medicine or for 1 month after stopping it. Talk to your health care professional or pharmacist for more information. What side effects may I notice from receiving this medicine? Side effects that you should report to your doctor or health care professional as soon as possible:  any new or unusual symptoms  changes in vision  fever  leg or arm swelling  pain in bones, joints, or muscles  pain in hips, back, ribs, arms, shoulders, or legs Side effects that usually do not require medical attention (report to your doctor or health care professional if they continue or are bothersome):  difficulty sleeping  headache  hot flashes  sweating  unusually weak or tired This list may not describe all possible side effects. Call your doctor for medical advice about side effects. You may report side effects to FDA at 1-800-FDA-1088. Where should I keep my medicine? Keep out  of the reach of children. Store at room temperature between 15 and 30 degrees C (59 and 86 degrees F). Throw away any unused medicine after the expiration date. NOTE: This sheet is a summary. It may not cover all possible information. If you have questions about this medicine, talk to your doctor, pharmacist, or health  care provider.  2020 Elsevier/Gold Standard (2017-06-05 08:39:27)

## 2019-09-24 ENCOUNTER — Telehealth: Payer: Self-pay | Admitting: Oncology

## 2019-09-24 ENCOUNTER — Ambulatory Visit
Admission: RE | Admit: 2019-09-24 | Discharge: 2019-09-24 | Disposition: A | Discharge status: Home / Self Care | Payer: Medicare Other | Source: Ambulatory Visit | Attending: Radiation Oncology | Admitting: Radiation Oncology

## 2019-09-24 ENCOUNTER — Encounter: Payer: Self-pay | Admitting: Radiation Oncology

## 2019-09-24 VITALS — Ht 62.0 in | Wt 161.0 lb

## 2019-09-24 DIAGNOSIS — C50812 Malignant neoplasm of overlapping sites of left female breast: Secondary | ICD-10-CM | POA: Diagnosis not present

## 2019-09-24 DIAGNOSIS — C773 Secondary and unspecified malignant neoplasm of axilla and upper limb lymph nodes: Secondary | ICD-10-CM | POA: Diagnosis not present

## 2019-09-24 DIAGNOSIS — Z170 Estrogen receptor positive status [ER+]: Secondary | ICD-10-CM | POA: Diagnosis not present

## 2019-09-24 DIAGNOSIS — Z79811 Long term (current) use of aromatase inhibitors: Secondary | ICD-10-CM | POA: Diagnosis not present

## 2019-09-24 NOTE — Telephone Encounter (Signed)
I talk with patient regarding schedule  

## 2019-09-24 NOTE — Progress Notes (Signed)
Radiation Oncology         ([phone]) [phone] ________________________________  Initial Outpatient Consultation - Conducted via telephone due to current COVID-19 concerns for limiting patient exposure  I spoke with the patient to conduct this consult visit via telephone to spare the patient unnecessary potential exposure in the healthcare setting during the current COVID-19 pandemic. The patient was notified in advance and was offered a [place] meeting to allow for face to face communication but unfortunately reported that they did not have the appropriate resources/technology to support such a visit and instead preferred to proceed with a telephone consult.  __________________  Name: Milyn Zylstra        MRN: [id number]  Date of Service: 09/24/2019 DOB: 07/29/1926  [id number], Tiffany L, DO  Causey, Lindsey Cornett*     REFERRING PHYSICIAN: Wilber Bihari Cornett*   DIAGNOSIS: The encounter diagnosis was Malignant neoplasm of overlapping sites of left breast in female, estrogen receptor positive ([place]).   HISTORY OF PRESENT ILLNESS: Kiaundra Kawamura is a 85 y.o. female with a history of high-grade ER PR positive DCIS since May 2017.  She took anastrozole rather than surgery at the time, and was followed closely.  In June 2019 there was concern for disease progression with possible lymph node extension and her anastrozole was switched to letrozole.  There was evidence again on imaging in November 2019 with progressive change and was [place] switched to exemestane.  Of note fulvestrant had also been utilized since November 2019. Again showed her disease progression, and she subsequently underwent left radical mastectomy and lymph node dissection on 9/10/2020Mammography in July 2020 which revealed multifocal invasive ductal carcinoma the largest measuring 4.2 cm, her margins were negative and there was associated DCIS.  5 out of the 14 lymph nodes sampled were positive for disease.  Her case was discussed  yesterday in conference and again despite her age, she would be a candidate to consider adjuvant radiotherapy due to the fact that she is progressed while on multiple antiestrogen agents.  She is contacted today by phone to discuss this.     PREVIOUS RADIATION THERAPY: No   PAST MEDICAL HISTORY:  Past Medical History:  Diagnosis Date  . Alzheimer's disease ([place])   . Cancer ([place])   . Disorder of bone and cartilage, unspecified   . Dizziness and giddiness   . Hyperlipidemia LDL goal < 100   . Hypertension   . Hypothyroidism   . Malignant neoplasm of thyroid gland ([place])   . Nontoxic uninodular goiter   . Other malaise and fatigue   . Other symptoms involving cardiovascular system   . Phlebitis and thrombophlebitis of unspecified site   . Senile osteoporosis   . Sleep related leg cramps   . Stroke ([place])   . Unspecified disorder of lipoid metabolism   . Unspecified hereditary and idiopathic peripheral neuropathy   . Varicose veins of lower extremities with inflammation        PAST SURGICAL HISTORY: Past Surgical History:  Procedure Laterality Date  . ABDOMINAL HYSTERECTOMY     DR HUGES  . BREAST BIOPSY Left 05/21/2016   malignant  . CATRACT SURGERY  2008  . COLON SURGERY     clipped polyp during colonoscopy  . KNEE SURGERY     right  . MASTECTOMY MODIFIED RADICAL Left 09/10/2019   Procedure: LEFT MASTECTOMY MODIFIED RADICAL;  Surgeon: Jovita Kussmaul, MD;  Location: [facility];  Service: General;  Laterality: Left;  . PARATHYROID ADENOMA REMOVED  1983  .  THYROID SURGERY  July 20, 2011     FAMILY HISTORY:  Family History  Problem Relation Age of Onset  . Diabetes Brother      SOCIAL HISTORY:  reports that she has never smoked. She has never used smokeless tobacco. She reports that she does not drink alcohol or use drugs. The patient is widowed and lives in [place] with her sister. She does not cook or do any of her own cleaning or laundry.   ALLERGIES: Patient has  no known allergies.   MEDICATIONS:  Current Outpatient Medications  Medication Sig Dispense Refill  . acetaminophen (TYLENOL) 325 MG tablet Take 2 tablets (650 mg total) by mouth every 6 (six) hours as needed for mild pain, moderate pain, fever or headache.    . alendronate (FOSAMAX) 70 MG tablet TAKE 1 TABLET (70 MG TOTAL) BY MOUTH EVERY 7 (SEVEN) DAYS. TAKE WITH A FULL GLASS OF WATER ON AN EMPTY STOMACH. 12 tablet 1  . allopurinol (ZYLOPRIM) 300 MG tablet TAKE 1 TABLET EVERY DAY 90 tablet 1  . amLODipine (NORVASC) 10 MG tablet TAKE 1 TABLET EVERY DAY 90 tablet 1  . aspirin EC 81 MG tablet Take 1 tablet (81 mg total) by mouth daily. 30 tablet 3  . atenolol (TENORMIN) 25 MG tablet TAKE 1 TABLET EVERY DAY 90 tablet 1  . atorvastatin (LIPITOR) 40 MG tablet TAKE 1 TABLET EVERY DAY 90 tablet 0  . calcium carbonate (TUMS - DOSED IN MG ELEMENTAL CALCIUM) 500 MG chewable tablet Chew 1 tablet by mouth as needed for indigestion or heartburn.    . Cholecalciferol (VITAMIN D) 50 MCG (2000 UT) CAPS Take 1 capsule by mouth 3 (three) times a week.    Marland Kitchen HYDROcodone-acetaminophen (NORCO/VICODIN) 5-325 MG tablet Take 1-2 tablets by mouth every 4 (four) hours as needed for severe pain (pain not relieved by plain Tylenol). 20 tablet 0  . hydrOXYzine (ATARAX/VISTARIL) 10 MG tablet Take 1 tablet (10 mg total) by mouth 3 (three) times daily as needed. 30 tablet 0  . iron polysaccharides (NIFEREX) 150 MG capsule Take 1 capsule (150 mg total) by mouth every other day. 15 capsule 3  . levothyroxine (SYNTHROID, LEVOTHROID) 112 MCG tablet Take 112 mcg by mouth daily before breakfast.     . lisinopril (ZESTRIL) 2.5 MG tablet TAKE 1 TABLET DAILY TO CONTROL BLOOD PRESSURE 90 tablet 1  . magnesium hydroxide (MILK OF MAGNESIA) 400 MG/5ML suspension Take 5-10 mLs by mouth daily as needed for mild constipation.    . sertraline (ZOLOFT) 50 MG tablet TAKE ONE TABLET DAILY FOR DEPRESSION 90 tablet 1   No current  facility-administered medications for this encounter.      REVIEW OF SYSTEMS: On review of systems, the patient reports that she is doing well overall, though she admits to being pretty tired. She denies any chest pain, shortness of breath, cough, fevers, chills, night sweats, unintended weight changes. She denies any bowel or bladder disturbances, and denies abdominal pain, nausea or vomiting. She denies any new musculoskeletal or joint aches or pains. A complete review of systems is obtained and is otherwise negative.     PHYSICAL EXAM:  Wt Readings from Last 3 Encounters:  09/23/19 161 lb 11.2 oz (73.3 kg)  09/11/19 156 lb 15.5 oz (71.2 kg)  09/03/19 157 lb (71.2 kg)   Unable to assess due to encounter type.  ECOG = 2  0 - Asymptomatic (Fully active, able to carry on all predisease activities without restriction)  1 -  Symptomatic but completely ambulatory (Restricted in physically strenuous activity but ambulatory and able to carry out work of a light or sedentary nature. For example, light housework, office work)  2 - Symptomatic, <50% in bed during the day (Ambulatory and capable of all self care but unable to carry out any work activities. Up and about more than 50% of waking hours)  3 - Symptomatic, >50% in bed, but not bedbound (Capable of only limited self-care, confined to bed or chair 50% or more of waking hours)  4 - Bedbound (Completely disabled. Cannot carry on any self-care. Totally confined to bed or chair)  5 - Death   Eustace Pen MM, Creech RH, Tormey DC, et al. [phone]). "Toxicity and response criteria of the [facility] Group". [place] Oncol. 5 (6): 649-55    LABORATORY DATA:  Lab Results  Component Value Date   WBC 5.3 09/23/2019   HGB 10.1 (L) 09/23/2019   HCT 32.3 (L) 09/23/2019   MCV 96.1 09/23/2019   PLT 181 09/23/2019   Lab Results  Component Value Date   NA 142 09/23/2019   K 4.3 09/23/2019   CL 110 09/23/2019   CO2 25 09/23/2019    Lab Results  Component Value Date   ALT 18 09/23/2019   AST 23 09/23/2019   ALKPHOS 63 09/23/2019   BILITOT 0.6 09/23/2019      RADIOGRAPHY: Ct Chest W Contrast  Result Date: 08/26/2019 CLINICAL DATA:  Left breast cancer treated with oral chemotherapy. EXAM: CT CHEST WITH CONTRAST TECHNIQUE: Multidetector CT imaging of the chest was performed during intravenous contrast administration. CONTRAST:  13mL OMNIPAQUE IOHEXOL 300 MG/ML  SOLN COMPARISON:  CT abdomen pelvis 07/22/2015. FINDINGS: Cardiovascular: Atherosclerotic calcification of the aorta, aortic valve and coronary arteries. Pulmonary arteries are enlarged with marked dilatation of the proximal left pulmonary artery. Heart is enlarged. No pericardial effusion. Mediastinum/Nodes: Thyroidectomy. No pathologically enlarged mediastinal or hilar lymph nodes. Left internal mammary lymph node measures 5 mm (2/59). Left axillary lymph nodes measure up to 8 mm (2/61). No right axillary adenopathy. Esophagus is unremarkable. Lungs/Pleura: 2 mm peripheral left upper lobe nodule (7/62), likely benign. Lungs are otherwise clear. No pleural fluid. Airway is unremarkable. Upper Abdomen: Visualized portions of the liver, gallbladder, adrenal glands, kidneys, spleen, pancreas, stomach and bowel are unremarkable. No upper abdominal adenopathy. Musculoskeletal: Degenerative changes in the spine and shoulders, right greater than left. No worrisome lytic or sclerotic lesions. IMPRESSION: 1. No evidence of metastatic disease. Left internal mammary and left axillary lymph nodes are not considered pathologically enlarged. Continued attention on follow-up exams is warranted. 2.  Aortic atherosclerosis (ICD10-170.0).  Coronary calcification. 3. Enlarged pulmonary arteries, particularly involving the left pulmonary artery, indicative of pulmonary arterial hypertension. Electronically Signed   By: Lorin Picket M.D.   On: 08/26/2019 08:18       IMPRESSION/PLAN: 1.  Stage IIIA, pT2N2aM0 grade 3, ER positive invasive ductal carcinoma of the left breast.  Dr. Lisbeth Renshaw discusses the patient's course and reviews the discussion that was had yesterday at multidisciplinary breast oncology conference.  He reviews her pathology report from her most recent surgery, and discusses the option to consider adjuvant radiotherapy.  Despite her age and overall status, the discussion was to truly consider radiotherapy if the patient was in agreement due to the fact that she had progression while on estrogen receptor blocking drugs. At the end of the discussion we had reviewed the risks, benefits, short, and long term effects of radiotherapy. The patient  is undecided about what she would like to do. We will tentatively plan to follow back up with her in a few weeks to see how she's healing, and then make a formal plan for treatment if she's willing to proceed.   Given current concerns for patient exposure during the COVID-19 pandemic, this encounter was conducted via telephone.  The patient has given verbal consent for this type of encounter. The time spent during this encounter was 45 minutes and 50% of that time was spent in the coordination of her care. The attendants for this meeting include Shona Simpson, [facility] and [name]  During the encounter, Shona Simpson [facility] was located at [facility] Radiation Oncology Department.  Adalen Dill  was located at home.  The above documentation reflects my direct findings during this shared patient visit. Please see the separate note by Dr. Lisbeth Renshaw on this date for the remainder of the patient's plan of care.    Carola Rhine, PAC

## 2019-09-24 NOTE — Progress Notes (Signed)
Location of Breast Cancer: Malignant neoplasm of overlapping sites of left breast in female +  Did patient present with symptoms (if so, please note symptoms) or was this found on screening mammography?:   Histology per Pathology Report: Left mastectomy 09/10/2019   Receptor Status: ER(100% +), PR (0% -), Her2-neu (-), Ki-67(90%)    Past/Anticipated interventions by surgeon, if any: Dr. Marlou Starks 09/10/2019 -Diagnosed in 2017 with ductal carcinoma in situ of left breast that was ER positive.  Given her age she was treated with antiestrogens but the tumor has grown. -She now has multiple masses in the left breast as well as multiple enlarging lymph nodes in the left axilla. -Since she has failed medical management her oncologist would like her to have a more definitive surgery at this point. -Left mastectomy modified radical 09/10/2019   Past/Anticipated interventions by medical oncology, if any: Chemotherapy  Dr. Jana Hakim NP Delice Bison 09/23/2019 (1) opted against surgery   (2) started anastrozole neoadjuvantly 06/13/2016, discontinued June 2018 due to osteoporosis concerns             (a)  bone density 11/16/2016 shows a T score of -4.7 (osteoporosis)-on fosamax.   (3) mammography/ultrasonography 06/10/2018 shows evidence of disease progression, possible lymph node extension  (4) letrozole started 06/27/2018              (a) discontinued November 2019 with evidence of progression             (b) restarted 09/2019, to change to Exemestane in 12/2019  (5) fulvestrant started 11/21/2018             (a) mammography and ultrasonography 07/24/2019 shows evidence of disease progression  (6) Left radical mastectomy and node dissection on 09/10/2019: showing a stage IIIA, T2, N2a, invasive ductal carcinoma, grade 3, margins negative, ER+,PR-, HER-2-, Ki-67 90%             (a) 4 out of 14 lymph nodes positive for macrometastases             (b) 1 out of 14 lymph nodes positive for micrometastases              (c) CT chest on 08/26/2019 negative for metastatic disease  (7) Adjuvant radiation pending -We reviewed her surgical pathology.  Dr. Jana Hakim reviewed his recommendation for daily radiation therapy to prevent local recurrence.  Khia was recommended to restart the Letrozole, and then we will likely change the Letrozole to Exemestane in 3 months.   Lymphedema issues, if any:  Little bit, no drain needed  Pain issues, if any:  None  SAFETY ISSUES:  Prior radiation? No  Pacemaker/ICD? No  Possible current pregnancy? Hysterectomy  Is the patient on methotrexate? No  Current Complaints / other details:       Cori Razor, RN 09/24/2019,8:20 AM

## 2019-10-09 ENCOUNTER — Telehealth: Payer: Self-pay | Admitting: Radiation Oncology

## 2019-10-09 NOTE — Telephone Encounter (Signed)
I called the patient to see if she had decided on proceeding with radiotherapy. She did not recall our conversation a few weeks ago and I was able to then speak with her and her sister about the rationale to consider radiotherapy to reduce the risks of local recurrence considering she had progression despite multiple courses of antiestrogen therapy. She indicates she does not want radiotherapy and her sister confirmed her own understanding of these risks and agrees to support the patient's decision to forgo radiotherapy. I will let Dr. Jana Hakim know of her decision as well.

## 2019-10-13 ENCOUNTER — Other Ambulatory Visit: Payer: Self-pay | Admitting: Oncology

## 2019-10-13 DIAGNOSIS — K5900 Constipation, unspecified: Secondary | ICD-10-CM | POA: Diagnosis not present

## 2019-10-13 DIAGNOSIS — D0512 Intraductal carcinoma in situ of left breast: Secondary | ICD-10-CM | POA: Diagnosis not present

## 2019-10-13 DIAGNOSIS — F329 Major depressive disorder, single episode, unspecified: Secondary | ICD-10-CM | POA: Diagnosis not present

## 2019-10-13 DIAGNOSIS — Z7982 Long term (current) use of aspirin: Secondary | ICD-10-CM | POA: Diagnosis not present

## 2019-10-13 DIAGNOSIS — E890 Postprocedural hypothyroidism: Secondary | ICD-10-CM | POA: Diagnosis not present

## 2019-10-13 DIAGNOSIS — G309 Alzheimer's disease, unspecified: Secondary | ICD-10-CM | POA: Diagnosis not present

## 2019-10-13 DIAGNOSIS — M150 Primary generalized (osteo)arthritis: Secondary | ICD-10-CM | POA: Diagnosis not present

## 2019-10-13 DIAGNOSIS — Z79891 Long term (current) use of opiate analgesic: Secondary | ICD-10-CM | POA: Diagnosis not present

## 2019-10-13 DIAGNOSIS — Z85820 Personal history of malignant melanoma of skin: Secondary | ICD-10-CM | POA: Diagnosis not present

## 2019-10-13 DIAGNOSIS — F0280 Dementia in other diseases classified elsewhere without behavioral disturbance: Secondary | ICD-10-CM | POA: Diagnosis not present

## 2019-10-13 DIAGNOSIS — Z170 Estrogen receptor positive status [ER+]: Secondary | ICD-10-CM | POA: Diagnosis not present

## 2019-10-13 DIAGNOSIS — I10 Essential (primary) hypertension: Secondary | ICD-10-CM | POA: Diagnosis not present

## 2019-10-13 DIAGNOSIS — Z96659 Presence of unspecified artificial knee joint: Secondary | ICD-10-CM | POA: Diagnosis not present

## 2019-10-13 DIAGNOSIS — M810 Age-related osteoporosis without current pathological fracture: Secondary | ICD-10-CM | POA: Diagnosis not present

## 2019-10-13 DIAGNOSIS — K219 Gastro-esophageal reflux disease without esophagitis: Secondary | ICD-10-CM | POA: Diagnosis not present

## 2019-10-13 DIAGNOSIS — Z7983 Long term (current) use of bisphosphonates: Secondary | ICD-10-CM | POA: Diagnosis not present

## 2019-10-13 DIAGNOSIS — M109 Gout, unspecified: Secondary | ICD-10-CM | POA: Diagnosis not present

## 2019-10-13 DIAGNOSIS — Z9012 Acquired absence of left breast and nipple: Secondary | ICD-10-CM | POA: Diagnosis not present

## 2019-10-13 DIAGNOSIS — Z483 Aftercare following surgery for neoplasm: Secondary | ICD-10-CM | POA: Diagnosis not present

## 2019-10-13 DIAGNOSIS — R591 Generalized enlarged lymph nodes: Secondary | ICD-10-CM | POA: Diagnosis not present

## 2019-10-13 DIAGNOSIS — I69354 Hemiplegia and hemiparesis following cerebral infarction affecting left non-dominant side: Secondary | ICD-10-CM | POA: Diagnosis not present

## 2019-10-13 DIAGNOSIS — Z9181 History of falling: Secondary | ICD-10-CM | POA: Diagnosis not present

## 2019-10-13 NOTE — Telephone Encounter (Signed)
Thank you, she sees [place] in early NOV and we'll take it from there  GM

## 2019-10-14 DIAGNOSIS — F0280 Dementia in other diseases classified elsewhere without behavioral disturbance: Secondary | ICD-10-CM | POA: Diagnosis not present

## 2019-10-14 DIAGNOSIS — G309 Alzheimer's disease, unspecified: Secondary | ICD-10-CM | POA: Diagnosis not present

## 2019-10-14 DIAGNOSIS — I69354 Hemiplegia and hemiparesis following cerebral infarction affecting left non-dominant side: Secondary | ICD-10-CM | POA: Diagnosis not present

## 2019-10-14 DIAGNOSIS — D0512 Intraductal carcinoma in situ of left breast: Secondary | ICD-10-CM | POA: Diagnosis not present

## 2019-10-14 DIAGNOSIS — Z483 Aftercare following surgery for neoplasm: Secondary | ICD-10-CM | POA: Diagnosis not present

## 2019-10-14 DIAGNOSIS — M150 Primary generalized (osteo)arthritis: Secondary | ICD-10-CM | POA: Diagnosis not present

## 2019-10-19 ENCOUNTER — Other Ambulatory Visit: Payer: Self-pay

## 2019-10-19 ENCOUNTER — Ambulatory Visit (INDEPENDENT_AMBULATORY_CARE_PROVIDER_SITE_OTHER): Payer: Medicare Other | Admitting: Internal Medicine

## 2019-10-19 ENCOUNTER — Encounter: Payer: Self-pay | Admitting: Internal Medicine

## 2019-10-19 VITALS — BP 160/80 | HR 76 | Temp 97.9°F | Resp 21 | Ht 61.0 in | Wt 158.0 lb

## 2019-10-19 DIAGNOSIS — C50812 Malignant neoplasm of overlapping sites of left female breast: Secondary | ICD-10-CM

## 2019-10-19 DIAGNOSIS — Z9012 Acquired absence of left breast and nipple: Secondary | ICD-10-CM

## 2019-10-19 DIAGNOSIS — Z170 Estrogen receptor positive status [ER+]: Secondary | ICD-10-CM

## 2019-10-19 DIAGNOSIS — K5901 Slow transit constipation: Secondary | ICD-10-CM | POA: Diagnosis not present

## 2019-10-19 DIAGNOSIS — F0150 Vascular dementia without behavioral disturbance: Secondary | ICD-10-CM

## 2019-10-19 NOTE — Progress Notes (Signed)
Location:  [facility] clinic  Provider: Dr. Hollace Kinnier Advanced Directives 10/19/2019  Does Patient Have a Medical Advance Directive? Yes  Type of Advance Directive Out of facility DNR (pink MOST or yellow form)  Does patient want to make changes to medical advance directive? No - Patient declined  Copy of [place] in Chart? -  Would patient like information on creating a medical advance directive? -  Pre-existing out of facility DNR order (yellow form or pink MOST form) Pink MOST/Yellow Form most recent copy in chart - Physician notified to receive inpatient order     Chief Complaint  Patient presents with  . Medical Management of Chronic Issues    4 moth Follow Up    HPI: Patient is a 86 y.o. female seen today for medical management of chronic diseases.    Sister [name] present today.   She is doing well since her left sided mastectomy in September 2020. Her pain is minimal and her surgical site has healed. She has had her two week follow up with oncology. Radiation is recommended. She is unsure about proceeding with it at this time. She prays to ease her anxiety and give her answers.   She is taking her medications daily. No reported side effects.   She is eating 1-2 meals daily. She tried to follow a diet low in sodium, fat and sugar. Tried to stay hydrated and drinks a few glasses of water daily.   Uses a front wheel walker to ambulate. No recent injuries or falls. Walkways are free from rugs or cords.   She is still hard of hearing. Denies it has gotten worse.   Still constipated at times. She will use milk of magnesia. LBM yesterday.   Past Medical History:  Diagnosis Date  . Alzheimer's disease ([place])   . Cancer ([place])   . Disorder of bone and cartilage, unspecified   . Dizziness and giddiness   . Hyperlipidemia LDL goal < 100   . Hypertension   . Hypothyroidism   . Malignant neoplasm of thyroid gland ([place])   . Nontoxic uninodular goiter   . Other  malaise and fatigue   . Other symptoms involving cardiovascular system   . Phlebitis and thrombophlebitis of unspecified site   . Senile osteoporosis   . Sleep related leg cramps   . Stroke ([place])   . Unspecified disorder of lipoid metabolism   . Unspecified hereditary and idiopathic peripheral neuropathy   . Varicose veins of lower extremities with inflammation     Past Surgical History:  Procedure Laterality Date  . ABDOMINAL HYSTERECTOMY     DR HUGES  . BREAST BIOPSY Left 05/21/2016   malignant  . CATRACT SURGERY  2008  . COLON SURGERY     clipped polyp during colonoscopy  . KNEE SURGERY     right  . MASTECTOMY MODIFIED RADICAL Left 09/10/2019   Procedure: LEFT MASTECTOMY MODIFIED RADICAL;  Surgeon: Jovita Kussmaul, MD;  Location: [facility];  Service: General;  Laterality: Left;  . PARATHYROID ADENOMA REMOVED  1983  . THYROID SURGERY  July 20, 2011    No Known Allergies  Outpatient Encounter Medications as of 10/19/2019  Medication Sig  . acetaminophen (TYLENOL) 325 MG tablet Take 2 tablets (650 mg total) by mouth every 6 (six) hours as needed for mild pain, moderate pain, fever or headache.  . alendronate (FOSAMAX) 70 MG tablet TAKE 1 TABLET (70 MG TOTAL) BY MOUTH EVERY 7 (SEVEN) DAYS. TAKE WITH  A FULL GLASS OF WATER ON AN EMPTY STOMACH.  Marland Kitchen allopurinol (ZYLOPRIM) 300 MG tablet TAKE 1 TABLET EVERY DAY  . amLODipine (NORVASC) 10 MG tablet TAKE 1 TABLET EVERY DAY  . anastrozole (ARIMIDEX) 1 MG tablet Take 1 mg by mouth daily.   Marland Kitchen aspirin EC 81 MG tablet Take 1 tablet (81 mg total) by mouth daily.  Marland Kitchen atenolol (TENORMIN) 25 MG tablet TAKE 1 TABLET EVERY DAY  . atorvastatin (LIPITOR) 40 MG tablet TAKE 1 TABLET EVERY DAY  . calcium carbonate (TUMS - DOSED IN MG ELEMENTAL CALCIUM) 500 MG chewable tablet Chew 1 tablet by mouth as needed for indigestion or heartburn.  . Cholecalciferol (VITAMIN D) 50 MCG (2000 UT) CAPS Take 1 capsule by mouth 3 (three) times a week.  Marland Kitchen  HYDROcodone-acetaminophen (NORCO/VICODIN) 5-325 MG tablet Take 1-2 tablets by mouth every 4 (four) hours as needed for severe pain (pain not relieved by plain Tylenol).  . hydrOXYzine (ATARAX/VISTARIL) 10 MG tablet Take 1 tablet (10 mg total) by mouth 3 (three) times daily as needed.  . iron polysaccharides (NIFEREX) 150 MG capsule Take 1 capsule (150 mg total) by mouth every other day.  . levothyroxine (SYNTHROID, LEVOTHROID) 112 MCG tablet Take 112 mcg by mouth daily before breakfast.   . lisinopril (ZESTRIL) 2.5 MG tablet TAKE 1 TABLET DAILY TO CONTROL BLOOD PRESSURE  . magnesium hydroxide (MILK OF MAGNESIA) 400 MG/5ML suspension Take 5-10 mLs by mouth daily as needed for mild constipation.  . sertraline (ZOLOFT) 50 MG tablet TAKE ONE TABLET DAILY FOR DEPRESSION   No facility-administered encounter medications on file as of 10/19/2019.     Review of Systems:  Review of Systems  Constitutional: Negative for activity change, appetite change and fatigue.  HENT: Positive for hearing loss. Negative for dental problem and trouble swallowing.   Eyes: Negative for photophobia and visual disturbance.  Respiratory: Negative for shortness of breath.   Cardiovascular: Negative for chest pain and leg swelling.  Gastrointestinal: Positive for constipation. Negative for abdominal pain, diarrhea and nausea.  Genitourinary: Positive for frequency. Negative for dysuria, hematuria and vaginal bleeding.  Musculoskeletal: Positive for neck pain.  Skin: Negative.   Neurological: Negative for dizziness, weakness and headaches.  Psychiatric/Behavioral: Negative for dysphoric mood and sleep disturbance. The patient is not nervous/anxious.     Health Maintenance  Topic Date Due  . TETANUS/TDAP  06/23/2028  . INFLUENZA VACCINE  Completed  . DEXA SCAN  Completed  . PNA vac Low Risk Adult  Completed    Physical Exam: Vitals:   10/19/19 1109  BP: (!) 160/80  Pulse: 76  Resp: (!) 21  Temp: 97.9 F (36.6  C)  TempSrc: Oral  SpO2: 96%  Weight: 158 lb (71.7 kg)  Height: 5\' 1"  (1.549 m)   Body mass index is 29.85 kg/m. Physical Exam Vitals signs reviewed.  Constitutional:      General: She is not in acute distress.    Appearance: Normal appearance. She is normal weight.  Cardiovascular:     Rate and Rhythm: Normal rate and regular rhythm.     Pulses: Normal pulses.     Heart sounds: No murmur.  Pulmonary:     Effort: Pulmonary effort is normal. No respiratory distress.     Breath sounds: Normal breath sounds. No wheezing.  Abdominal:     General: Bowel sounds are normal. There is no distension.     Palpations: Abdomen is soft.  Musculoskeletal:     Right lower leg: No  edema.     Left lower leg: No edema.  Skin:    General: Skin is warm and dry.       Neurological:     Mental Status: She is alert.     Labs reviewed: Basic Metabolic Panel: Recent Labs    08/11/19 1051 09/02/19 1230 09/23/19 1105  NA 143 141 142  K 3.5 4.3 4.3  CL 109 106 110  CO2 28 24 25   GLUCOSE 101* 86 90  BUN 22 17 19   CREATININE 1.10* 1.06* 0.90  CALCIUM 9.6 9.7 9.4   Liver Function Tests: Recent Labs    03/12/19 1417 08/11/19 1051 09/23/19 1105  AST 31 29 23   ALT 29 27 18   ALKPHOS 61 52 63  BILITOT 0.7 0.8 0.6  PROT 7.2 7.5 6.9  ALBUMIN 3.7 4.0 3.5   No results for input(s): LIPASE, AMYLASE in the last 8760 hours. No results for input(s): AMMONIA in the last 8760 hours. CBC: Recent Labs    03/12/19 1417 08/11/19 1051 09/10/19 0930 09/23/19 1105  WBC 4.0 4.0 4.5 5.3  NEUTROABS 2.2 2.4  --  3.2  HGB 10.6* 11.4* 12.5 10.1*  HCT 35.0* 35.9* 40.6 32.3*  MCV 95.4 94.2 96.0 96.1  PLT 169 184 189 181   Lipid Panel: No results for input(s): CHOL, HDL, LDLCALC, TRIG, CHOLHDL, LDLDIRECT in the last 8760 hours. Lab Results  Component Value Date   HGBA1C 5.0 11/09/2015    Procedures since last visit: No results found.  Assessment/Plan 1. Malignant neoplasm of overlapping  sites of left breast in female, estrogen receptor positive ([place]) - followed by Dr. Curlene Labrum - patient questions whether to procede with radiation - I will reach out to oncology in order to help answer patient concerns - continue current anastrozole dosage  2. S/P mastectomy, left - followed by Dr. Marlou Starks - scheduled to be seen next week  3. Vascular dementia without behavioral disturbance ([facility]) - stable, no signs of progression at this time - family denies any hallucinations or outbursts  4. Constipation, slow transit - stable, no progression, abdomen soft with active bowel sounds - last BM yesterday - continure to use milk of magnesia as needed      Labs/tests ordered:  None Next appt:  11/03/2019

## 2019-10-19 NOTE — Patient Instructions (Signed)
I will check in with your other doctors to discuss the radiation therapy for your breast cancer so we can help you make a good informed decision.

## 2019-10-20 DIAGNOSIS — G309 Alzheimer's disease, unspecified: Secondary | ICD-10-CM | POA: Diagnosis not present

## 2019-10-20 DIAGNOSIS — Z483 Aftercare following surgery for neoplasm: Secondary | ICD-10-CM | POA: Diagnosis not present

## 2019-10-20 DIAGNOSIS — F0280 Dementia in other diseases classified elsewhere without behavioral disturbance: Secondary | ICD-10-CM | POA: Diagnosis not present

## 2019-10-20 DIAGNOSIS — I69354 Hemiplegia and hemiparesis following cerebral infarction affecting left non-dominant side: Secondary | ICD-10-CM | POA: Diagnosis not present

## 2019-10-20 DIAGNOSIS — D0512 Intraductal carcinoma in situ of left breast: Secondary | ICD-10-CM | POA: Diagnosis not present

## 2019-10-20 DIAGNOSIS — M150 Primary generalized (osteo)arthritis: Secondary | ICD-10-CM | POA: Diagnosis not present

## 2019-10-26 DIAGNOSIS — M150 Primary generalized (osteo)arthritis: Secondary | ICD-10-CM | POA: Diagnosis not present

## 2019-10-26 DIAGNOSIS — D0512 Intraductal carcinoma in situ of left breast: Secondary | ICD-10-CM | POA: Diagnosis not present

## 2019-10-26 DIAGNOSIS — Z483 Aftercare following surgery for neoplasm: Secondary | ICD-10-CM | POA: Diagnosis not present

## 2019-10-26 DIAGNOSIS — I69354 Hemiplegia and hemiparesis following cerebral infarction affecting left non-dominant side: Secondary | ICD-10-CM | POA: Diagnosis not present

## 2019-10-26 DIAGNOSIS — G309 Alzheimer's disease, unspecified: Secondary | ICD-10-CM | POA: Diagnosis not present

## 2019-10-26 DIAGNOSIS — F0280 Dementia in other diseases classified elsewhere without behavioral disturbance: Secondary | ICD-10-CM | POA: Diagnosis not present

## 2019-10-29 DIAGNOSIS — G309 Alzheimer's disease, unspecified: Secondary | ICD-10-CM | POA: Diagnosis not present

## 2019-10-29 DIAGNOSIS — I69354 Hemiplegia and hemiparesis following cerebral infarction affecting left non-dominant side: Secondary | ICD-10-CM | POA: Diagnosis not present

## 2019-10-29 DIAGNOSIS — M150 Primary generalized (osteo)arthritis: Secondary | ICD-10-CM | POA: Diagnosis not present

## 2019-10-29 DIAGNOSIS — F0280 Dementia in other diseases classified elsewhere without behavioral disturbance: Secondary | ICD-10-CM | POA: Diagnosis not present

## 2019-10-29 DIAGNOSIS — Z483 Aftercare following surgery for neoplasm: Secondary | ICD-10-CM | POA: Diagnosis not present

## 2019-10-29 DIAGNOSIS — D0512 Intraductal carcinoma in situ of left breast: Secondary | ICD-10-CM | POA: Diagnosis not present

## 2019-11-02 ENCOUNTER — Encounter: Payer: Medicare Other | Admitting: Family

## 2019-11-02 ENCOUNTER — Ambulatory Visit: Payer: Self-pay

## 2019-11-03 ENCOUNTER — Other Ambulatory Visit: Payer: Self-pay

## 2019-11-03 ENCOUNTER — Ambulatory Visit (INDEPENDENT_AMBULATORY_CARE_PROVIDER_SITE_OTHER): Payer: Medicare Other | Admitting: Family

## 2019-11-03 ENCOUNTER — Encounter: Payer: Self-pay | Admitting: Family

## 2019-11-03 DIAGNOSIS — Z0000 Encounter for general adult medical examination without abnormal findings: Secondary | ICD-10-CM

## 2019-11-03 NOTE — Patient Instructions (Signed)
Shelia Marks , Thank you for taking time to come for your Medicare Wellness Visit. I appreciate your ongoing commitment to your health goals. Please review the following plan we discussed and let me know if I can assist you in the future.   Screening recommendations/referrals: Colonoscopy: N/A  Mammogram: N/A Bone Density : Up to date  Recommended yearly ophthalmology/optometry visit for glaucoma screening and checkup Recommended yearly dental visit for hygiene and checkup  Vaccinations: Influenza vaccine  : Up to date  Pneumococcal vaccine  : Up to date  Tdap vaccine : Up to date  Shingles vaccine: Declined     Advanced directives: Yes   Conditions/risks identified: Advance Age female > 29 yrs,Hypertension,dyslipidemia  Next appointment: 1 year    Preventive Care 44 Years and Older, Female Preventive care refers to lifestyle choices and visits with your health care provider that can promote health and wellness. What does preventive care include?  A yearly physical exam. This is also called an annual well check.  Dental exams once or twice a year.  Routine eye exams. Ask your health care provider how often you should have your eyes checked.  Personal lifestyle choices, including:  Daily care of your teeth and gums.  Regular physical activity.  Eating a healthy diet.  Avoiding tobacco and drug use.  Limiting alcohol use.  Practicing safe sex.  Taking low-dose aspirin every day.  Taking vitamin and mineral supplements as recommended by your health care provider. What happens during an annual well check? The services and screenings done by your health care provider during your annual well check will depend on your age, overall health, lifestyle risk factors, and family history of disease. Counseling  Your health care provider may ask you questions about your:  Alcohol use.  Tobacco use.  Drug use.  Emotional well-being.  Home and relationship well-being.   Sexual activity.  Eating habits.  History of falls.  Memory and ability to understand (cognition).  Work and work Statistician.  Reproductive health. Screening  You may have the following tests or measurements:  Height, weight, and BMI.  Blood pressure.  Lipid and cholesterol levels. These may be checked every 5 years, or more frequently if you are over 24 years old.  Skin check.  Lung cancer screening. You may have this screening every year starting at age 51 if you have a 30-pack-year history of smoking and currently smoke or have quit within the past 15 years.  Fecal occult blood test (FOBT) of the stool. You may have this test every year starting at age 59.  Flexible sigmoidoscopy or colonoscopy. You may have a sigmoidoscopy every 5 years or a colonoscopy every 10 years starting at age 55.  Hepatitis C blood test.  Hepatitis B blood test.  Sexually transmitted disease (STD) testing.  Diabetes screening. This is done by checking your blood sugar (glucose) after you have not eaten for a while (fasting). You may have this done every 1-3 years.  Bone density scan. This is done to screen for osteoporosis. You may have this done starting at age 64.  Mammogram. This may be done every 1-2 years. Talk to your health care provider about how often you should have regular mammograms. Talk with your health care provider about your test results, treatment options, and if necessary, the need for more tests. Vaccines  Your health care provider may recommend certain vaccines, such as:  Influenza vaccine. This is recommended every year.  Tetanus, diphtheria, and acellular pertussis (Tdap,  Td) vaccine. You may need a Td booster every 10 years.  Zoster vaccine. You may need this after age 74.  Pneumococcal 13-valent conjugate (PCV13) vaccine. One dose is recommended after age 64.  Pneumococcal polysaccharide (PPSV23) vaccine. One dose is recommended after age 26. Talk to your  health care provider about which screenings and vaccines you need and how often you need them. This information is not intended to replace advice given to you by your health care provider. Make sure you discuss any questions you have with your health care provider. Document Released: 01/13/2016 Document Revised: 09/05/2016 Document Reviewed: 10/18/2015 Elsevier Interactive Patient Education  2017 [employer] Prevention in the Home Falls can cause injuries. They can happen to people of all ages. There are many things you can do to make your home safe and to help prevent falls. What can I do on the outside of my home?  Regularly fix the edges of walkways and driveways and fix any cracks.  Remove anything that might make you trip as you walk through a door, such as a raised step or threshold.  Trim any bushes or trees on the path to your home.  Use bright outdoor lighting.  Clear any walking paths of anything that might make someone trip, such as rocks or tools.  Regularly check to see if handrails are loose or broken. Make sure that both sides of any steps have handrails.  Any raised decks and porches should have guardrails on the edges.  Have any leaves, snow, or ice cleared regularly.  Use sand or salt on walking paths during winter.  Clean up any spills in your garage right away. This includes oil or grease spills. What can I do in the bathroom?  Use night lights.  Install grab bars by the toilet and in the tub and shower. Do not use towel bars as grab bars.  Use non-skid mats or decals in the tub or shower.  If you need to sit down in the shower, use a plastic, non-slip stool.  Keep the floor dry. Clean up any water that spills on the floor as soon as it happens.  Remove soap buildup in the tub or shower regularly.  Attach bath mats securely with double-sided non-slip rug tape.  Do not have throw rugs and other things on the floor that can make you trip. What  can I do in the bedroom?  Use night lights.  Make sure that you have a light by your bed that is easy to reach.  Do not use any sheets or blankets that are too big for your bed. They should not hang down onto the floor.  Have a firm chair that has side arms. You can use this for support while you get dressed.  Do not have throw rugs and other things on the floor that can make you trip. What can I do in the kitchen?  Clean up any spills right away.  Avoid walking on wet floors.  Keep items that you use a lot in easy-to-reach places.  If you need to reach something above you, use a strong step stool that has a grab bar.  Keep electrical cords out of the way.  Do not use floor polish or wax that makes floors slippery. If you must use wax, use non-skid floor wax.  Do not have throw rugs and other things on the floor that can make you trip. What can I do with my stairs?  Do not  leave any items on the stairs.  Make sure that there are handrails on both sides of the stairs and use them. Fix handrails that are broken or loose. Make sure that handrails are as long as the stairways.  Check any carpeting to make sure that it is firmly attached to the stairs. Fix any carpet that is loose or worn.  Avoid having throw rugs at the top or bottom of the stairs. If you do have throw rugs, attach them to the floor with carpet tape.  Make sure that you have a light switch at the top of the stairs and the bottom of the stairs. If you do not have them, ask someone to add them for you. What else can I do to help prevent falls?  Wear shoes that:  Do not have high heels.  Have rubber bottoms.  Are comfortable and fit you well.  Are closed at the toe. Do not wear sandals.  If you use a stepladder:  Make sure that it is fully opened. Do not climb a closed stepladder.  Make sure that both sides of the stepladder are locked into place.  Ask someone to hold it for you, if possible.   Clearly mark and make sure that you can see:  Any grab bars or handrails.  First and last steps.  Where the edge of each step is.  Use tools that help you move around (mobility aids) if they are needed. These include:  Canes.  Walkers.  Scooters.  Crutches.  Turn on the lights when you go into a dark area. Replace any light bulbs as soon as they burn out.  Set up your furniture so you have a clear path. Avoid moving your furniture around.  If any of your floors are uneven, fix them.  If there are any pets around you, be aware of where they are.  Review your medicines with your doctor. Some medicines can make you feel dizzy. This can increase your chance of falling. Ask your doctor what other things that you can do to help prevent falls. This information is not intended to replace advice given to you by your health care provider. Make sure you discuss any questions you have with your health care provider. Document Released: 10/13/2009 Document Revised: 05/24/2016 Document Reviewed: 01/21/2015 Elsevier Interactive Patient Education  2017 [employer].

## 2019-11-03 NOTE — Progress Notes (Signed)
This service is provided via telemedicine  No vital signs collected/recorded due to the encounter was a telemedicine visit.   Location of patient (ex: home, work):  Home   Patient consents to a telephone visit:  Yes  Location of the provider (ex: office, home): office   Name of any referring provider:  Hollace Kinnier D.O.   Names of all persons participating in the telemedicine service and their role in the encounter:  Marlowe Sax, NP, Ruthell Rummage CMA, and [name]   Time spent on call:  Ruthell Rummage CMA, spent 10 minutes on phone with patient.

## 2019-11-03 NOTE — Progress Notes (Signed)
Subjective:   Shelia Marks is a 85 y.o. female who presents for Medicare Annual (Subsequent) preventive examination.  Review of Systems:  Cardiac Risk Factors include: advanced age (>50men, >63 women);hypertension;dyslipidemia     Objective:     Vitals: There were no vitals taken for this visit.  There is no height or weight on file to calculate BMI.  Advanced Directives 10/19/2019 09/24/2019 09/21/2019 09/11/2019 09/02/2019 10/27/2018 06/23/2018  Does Patient Have a Medical Advance Directive? Yes Yes Yes Yes - Yes Yes  Type of Advance Directive Out of facility DNR (pink MOST or yellow form) [place];Living will Out of facility DNR (pink MOST or yellow form) [place] of facility DNR (pink MOST or yellow form) Out of facility DNR (pink MOST or yellow form)  Does patient want to make changes to medical advance directive? No - Patient declined No - Patient declined No - Patient declined No - Patient declined - No - Patient declined No - Patient declined  Copy of [place] in Chart? - - - No - copy requested - - -  Would patient like information on creating a medical advance directive? - No - Patient declined No - Patient declined No - Patient declined No - Patient declined - -  Pre-existing out of facility DNR order (yellow form or pink MOST form) Pink MOST/Yellow Form most recent copy in chart - Physician notified to receive inpatient order - Pink MOST form placed in chart (order not valid for inpatient use);Yellow form placed in chart (order not valid for inpatient use) - - Yellow form placed in chart (order not valid for inpatient use) Yellow form placed in chart (order not valid for inpatient use)    Tobacco Social History   Tobacco Use  Smoking Status Never Smoker  Smokeless Tobacco Never Used     Counseling given: Not Answered   Clinical Intake:  Pre-visit preparation completed: No  Pain : No/denies pain      BMI - recorded: 29.85 Nutritional Status: BMI 25 -29 Overweight Nutritional Risks: None Diabetes: No  How often do you need to have someone help you when you read instructions, pamphlets, or other written materials from your doctor or pharmacy?: 5 - Always(family assist) What is the last grade level you completed in school?: 12 Grades  Interpreter Needed?: No  Information entered by :: Sarajean Dessert FNP-C  Past Medical History:  Diagnosis Date  . Alzheimer's disease ([place])   . Cancer ([place])   . Disorder of bone and cartilage, unspecified   . Dizziness and giddiness   . Hyperlipidemia LDL goal < 100   . Hypertension   . Hypothyroidism   . Malignant neoplasm of thyroid gland ([place])   . Nontoxic uninodular goiter   . Other malaise and fatigue   . Other symptoms involving cardiovascular system   . Phlebitis and thrombophlebitis of unspecified site   . Senile osteoporosis   . Sleep related leg cramps   . Stroke ([place])   . Unspecified disorder of lipoid metabolism   . Unspecified hereditary and idiopathic peripheral neuropathy   . Varicose veins of lower extremities with inflammation    Past Surgical History:  Procedure Laterality Date  . ABDOMINAL HYSTERECTOMY     DR HUGES  . BREAST BIOPSY Left 05/21/2016   malignant  . CATRACT SURGERY  2008  . COLON SURGERY     clipped polyp during colonoscopy  . KNEE SURGERY  right  . MASTECTOMY MODIFIED RADICAL Left 09/10/2019   Procedure: LEFT MASTECTOMY MODIFIED RADICAL;  Surgeon: Jovita Kussmaul, MD;  Location: [facility];  Service: General;  Laterality: Left;  . PARATHYROID ADENOMA REMOVED  1983  . THYROID SURGERY  July 20, 2011   Family History  Problem Relation Age of Onset  . Diabetes Brother    Social History   Socioeconomic History  . Marital status: Widowed    Spouse name: Not on file  . Number of children: Not on file  . Years of education: Not on file  . Highest education level: Not on file  Occupational History  . Not  on file  Social Needs  . Financial resource strain: Not hard at all  . Food insecurity    Worry: Never true    Inability: Never true  . Transportation needs    Medical: No    Non-medical: No  Tobacco Use  . Smoking status: Never Smoker  . Smokeless tobacco: Never Used  Substance and Sexual Activity  . Alcohol use: No  . Drug use: No  . Sexual activity: Not on file  Lifestyle  . Physical activity    Days per week: 0 days    Minutes per session: 0 min  . Stress: Only a little  Relationships  . Social connections    Talks on phone: More than three times a week    Gets together: More than three times a week    Attends religious service: More than 4 times per year    Active member of club or organization: No    Attends meetings of clubs or organizations: Never    Relationship status: Widowed  Other Topics Concern  . Not on file  Social History Narrative  . Not on file    Outpatient Encounter Medications as of 11/03/2019  Medication Sig  . acetaminophen (TYLENOL) 325 MG tablet Take 2 tablets (650 mg total) by mouth every 6 (six) hours as needed for mild pain, moderate pain, fever or headache.  . alendronate (FOSAMAX) 70 MG tablet TAKE 1 TABLET (70 MG TOTAL) BY MOUTH EVERY 7 (SEVEN) DAYS. TAKE WITH A FULL GLASS OF WATER ON AN EMPTY STOMACH.  Marland Kitchen allopurinol (ZYLOPRIM) 300 MG tablet TAKE 1 TABLET EVERY DAY  . amLODipine (NORVASC) 10 MG tablet TAKE 1 TABLET EVERY DAY  . anastrozole (ARIMIDEX) 1 MG tablet Take 1 mg by mouth daily.   Marland Kitchen aspirin EC 81 MG tablet Take 1 tablet (81 mg total) by mouth daily.  Marland Kitchen atenolol (TENORMIN) 25 MG tablet TAKE 1 TABLET EVERY DAY  . atorvastatin (LIPITOR) 40 MG tablet TAKE 1 TABLET EVERY DAY  . calcium carbonate (TUMS - DOSED IN MG ELEMENTAL CALCIUM) 500 MG chewable tablet Chew 1 tablet by mouth as needed for indigestion or heartburn.  . Cholecalciferol (VITAMIN D) 50 MCG (2000 UT) CAPS Take 1 capsule by mouth 3 (three) times a week.  Marland Kitchen  HYDROcodone-acetaminophen (NORCO/VICODIN) 5-325 MG tablet Take 1-2 tablets by mouth every 4 (four) hours as needed for severe pain (pain not relieved by plain Tylenol).  . hydrOXYzine (ATARAX/VISTARIL) 10 MG tablet Take 1 tablet (10 mg total) by mouth 3 (three) times daily as needed.  . iron polysaccharides (NIFEREX) 150 MG capsule Take 1 capsule (150 mg total) by mouth every other day.  . levothyroxine (SYNTHROID, LEVOTHROID) 112 MCG tablet Take 112 mcg by mouth daily before breakfast.   . lisinopril (ZESTRIL) 2.5 MG tablet TAKE 1 TABLET DAILY TO CONTROL  BLOOD PRESSURE  . magnesium hydroxide (MILK OF MAGNESIA) 400 MG/5ML suspension Take 5-10 mLs by mouth daily as needed for mild constipation.  . sertraline (ZOLOFT) 50 MG tablet TAKE ONE TABLET DAILY FOR DEPRESSION   No facility-administered encounter medications on file as of 11/03/2019.     Activities of Daily Living In your present state of health, do you have any difficulty performing the following activities: 11/03/2019 09/11/2019  Hearing? Tempie Donning  Comment wears hearing aids -  Vision? N N  Difficulty concentrating or making decisions? Y N  Comment [place] -  Walking or climbing stairs? N N  Dressing or bathing? N Y  Doing errands, shopping? Y N  Comment Family assist with driving -  Preparing Food and eating ? Y -  Comment family assist -  Using the Toilet? N -  In the past six months, have you accidently leaked urine? Y -  Do you have problems with loss of bowel control? N -  Managing your Medications? Y -  Managing your Finances? Y -  Comment family assist -  Housekeeping or managing your Housekeeping? Y -  Comment sister assist -  Some recent data might be hidden    Patient Care Team: Gayland Curry, DO as PCP - General (Geriatric Medicine) Rutherford Guys, MD as Consulting Physician (Ophthalmology) Magrinat, Virgie Dad, MD as Consulting Physician (Oncology) Jovita Kussmaul, MD as Consulting Physician (General Surgery) Jacelyn Pi, MD as Consulting Physician (Endocrinology)    Assessment:   This is a routine wellness examination for [place].  Exercise Activities and Dietary recommendations Current Exercise Habits: Home exercise routine, Type of exercise: walking, Time (Minutes): 10, Frequency (Times/Week): 3, Weekly Exercise (Minutes/Week): 30, Intensity: Mild, Exercise limited by: None identified  Goals    . Maintain LIfestyle     Starting today pt will maintain lifestyle.        Fall Risk Fall Risk  11/03/2019 10/19/2019 06/18/2019 02/26/2019 10/27/2018  Falls in the past year? 0 1 0 1 Yes  Number falls in past yr: 0 0 0 0 1  Injury with Fall? 0 0 0 0 No   Is the patient's home free of loose throw rugs in walkways, pet beds, electrical cords, etc?   no      Grab bars in the bathroom? yes      Handrails on the stairs?   yes      Adequate lighting?   yes  Depression Screen PHQ 2/9 Scores 11/03/2019 10/19/2019 06/18/2019 02/26/2019  PHQ - 2 Score 0 0 0 0  PHQ- 9 Score - - - -     Cognitive Function MMSE - Mini Mental State Exam 10/27/2018 06/13/2017 06/01/2016 05/27/2014 06/08/2013  Orientation to time 2 2 5 3 3   Orientation to Place 5 5 5 4 5   Registration 3 3 3 3 3   Attention/ Calculation 0 0 3 3 3   Recall 3 0 0 0 1  Language- name 2 objects 2 2 2 2 2   Language- repeat 1 1 1 1 1   Language- follow 3 step command 3 2 2 3 3   Language- read & follow direction 0 1 1 1 1   Write a sentence 1 0 1 1 1   Copy design 1 1 0 1 0  Total score 21 17 23 22 23      6CIT Screen 11/03/2019  What Year? 0 points  What month? 3 points  What time? 0 points  Count back from 20 4 points  Months  in reverse 4 points  Repeat phrase 10 points  Total Score 21    Immunization History  Administered Date(s) Administered  . Fluad Quad(high Dose 65+) 09/03/2019  . Influenza, High Dose Seasonal PF 10/05/2016, 10/28/2017, 10/27/2018  . Influenza,inj,Quad PF,6+ Mos 10/12/2013, 10/31/2015  . Pneumococcal Conjugate-13  08/27/2014  . Pneumococcal Polysaccharide-23 06/01/2016  . Td 06/23/2018    Qualifies for Shingles Vaccine? Decline   Screening Tests Health Maintenance  Topic Date Due  . TETANUS/TDAP  06/23/2028  . INFLUENZA VACCINE  Completed  . DEXA SCAN  Completed  . PNA vac Low Risk Adult  Completed    Cancer Screenings: Lung: Low Dose CT Chest recommended if Age 53-80 years, 30 pack-year currently smoking OR have quit w/in 15years. Patient does not qualify. Breast:  Up to date on Mammogram? Yes   Up to date of Bone Density/Dexa? Yes Colorectal:Age Out   Additional Screenings: Hepatitis C Screening: Low Risk      Plan:  - Declined Shingrix vaccine.  I have personally reviewed and noted the following in the patient's chart:   . Medical and social history . Use of alcohol, tobacco or illicit drugs  . Current medications and supplements . Functional ability and status . Nutritional status . Physical activity . Advanced directives . List of other physicians . Hospitalizations, surgeries, and ER visits in previous 12 months . Vitals . Screenings to include cognitive, depression, and falls . Referrals and appointments  In addition, I have reviewed and discussed with patient certain preventive protocols, quality metrics, and best practice recommendations. A written personalized care plan for preventive services as well as general preventive health recommendations were provided to patient.   Sandrea Hughs, NP  11/03/2019

## 2019-11-04 ENCOUNTER — Encounter: Payer: Self-pay | Admitting: General Practice

## 2019-11-04 ENCOUNTER — Inpatient Hospital Stay: Payer: Medicare Other

## 2019-11-04 ENCOUNTER — Other Ambulatory Visit: Payer: Self-pay

## 2019-11-04 ENCOUNTER — Inpatient Hospital Stay: Payer: Medicare Other | Attending: Oncology | Admitting: Adult Health

## 2019-11-04 VITALS — BP 149/72 | HR 73 | Temp 98.3°F | Resp 16 | Ht 61.0 in | Wt 156.3 lb

## 2019-11-04 DIAGNOSIS — C50412 Malignant neoplasm of upper-outer quadrant of left female breast: Secondary | ICD-10-CM | POA: Insufficient documentation

## 2019-11-04 DIAGNOSIS — Z79811 Long term (current) use of aromatase inhibitors: Secondary | ICD-10-CM | POA: Diagnosis not present

## 2019-11-04 DIAGNOSIS — C50812 Malignant neoplasm of overlapping sites of left female breast: Secondary | ICD-10-CM | POA: Diagnosis not present

## 2019-11-04 DIAGNOSIS — Z170 Estrogen receptor positive status [ER+]: Secondary | ICD-10-CM | POA: Insufficient documentation

## 2019-11-04 DIAGNOSIS — D0512 Intraductal carcinoma in situ of left breast: Secondary | ICD-10-CM

## 2019-11-04 LAB — COMPREHENSIVE METABOLIC PANEL
ALT: 34 U/L (ref 0–44)
AST: 38 U/L (ref 15–41)
Albumin: 3.6 g/dL (ref 3.5–5.0)
Alkaline Phosphatase: 65 U/L (ref 38–126)
Anion gap: 11 (ref 5–15)
BUN: 17 mg/dL (ref 8–23)
CO2: 23 mmol/L (ref 22–32)
Calcium: 9.4 mg/dL (ref 8.9–10.3)
Chloride: 109 mmol/L (ref 98–111)
Creatinine, Ser: 0.95 mg/dL (ref 0.44–1.00)
GFR calc Af Amer: >60 mL/min (ref >60)
GFR calc non Af Amer: 53 mL/min — ABNORMAL LOW (ref >60)
Glucose, Bld: 141 mg/dL — ABNORMAL HIGH (ref 70–99)
Potassium: 3.7 mmol/L (ref 3.5–5.1)
Sodium: 143 mmol/L (ref 135–145)
Total Bilirubin: 0.5 mg/dL (ref 0.3–1.2)
Total Protein: 7.4 g/dL (ref 6.5–8.1)

## 2019-11-04 LAB — CBC WITH DIFFERENTIAL/PLATELET
Abs Immature Granulocytes: 0.02 10*3/uL (ref 0.00–0.07)
Basophils Absolute: 0 10*3/uL (ref 0.0–0.1)
Basophils Relative: 1 %
Eosinophils Absolute: 0.1 10*3/uL (ref 0.0–0.5)
Eosinophils Relative: 1 %
HCT: 36.7 % (ref 36.0–46.0)
Hemoglobin: 11.4 g/dL — ABNORMAL LOW (ref 12.0–15.0)
Immature Granulocytes: 1 %
Lymphocytes Relative: 26 %
Lymphs Abs: 1.1 10*3/uL (ref 0.7–4.0)
MCH: 29.4 pg (ref 26.0–34.0)
MCHC: 31.1 g/dL (ref 30.0–36.0)
MCV: 94.6 fL (ref 80.0–100.0)
Monocytes Absolute: 0.3 10*3/uL (ref 0.1–1.0)
Monocytes Relative: 7 %
Neutro Abs: 2.9 10*3/uL (ref 1.7–7.7)
Neutrophils Relative %: 64 %
Platelets: 198 10*3/uL (ref 150–400)
RBC: 3.88 MIL/uL (ref 3.87–5.11)
RDW: 15.3 % (ref 11.5–15.5)
WBC: 4.4 10*3/uL (ref 4.0–10.5)
nRBC: 0 % (ref 0.0–0.2)

## 2019-11-04 MED ORDER — EXEMESTANE 25 MG PO TABS: 25.0000 mg | ORAL_TABLET | Freq: Every day | ORAL | 3 refills | Status: DC

## 2019-11-04 NOTE — Patient Instructions (Signed)
You met with Dr. Lisbeth Renshaw, and he recommended that you undergo radiation to reduce the risk of your breast cancer returning.  For the radiation to be most effective, it is important to undergo this as recommended, and within a certain time period following surgery.  The longer this is delayed, the lower the effectiveness of risk reduction will be.  If you decide later that you want radiation therapy, please call Worthy Flank at [phone], she is the physician assistant that works with Dr. Lisbeth Renshaw and can talk to you about it further.     Exemestane tablets What is this medicine? EXEMESTANE (ex e MES tane) blocks the production of the hormone estrogen. Some types of breast cancer depend on estrogen to grow, and this medicine can stop tumor growth by blocking estrogen production. This medicine is for the treatment of breast cancer in postmenopausal women only. This medicine may be used for other purposes; ask your health care provider or pharmacist if you have questions. COMMON BRAND NAME(S): Aromasin What should I tell my health care provider before I take this medicine? They need to know if you have any of these conditions:  an unusual or allergic reaction to exemestane, other medicines, foods, dyes, or preservatives  pregnant or trying to get pregnant  breast-feeding How should I use this medicine? Take this medicine by mouth with a glass of water. Follow the directions on the prescription label. Take your doses at regular intervals after a meal. Do not take your medicine more often than directed. Do not stop taking except on the advice of your doctor or health care professional. Contact your pediatrician regarding the use of this medicine in children. Special care may be needed. Overdosage: If you think you have taken too much of this medicine contact a poison control center or emergency room at once. NOTE: This medicine is only for you. Do not share this medicine with others. What if I miss  a dose? If you miss a dose, take the next dose as usual. Do not try to make up the missed dose. Do not take double or extra doses. What may interact with this medicine?  certain medicines for seizures like carbamazepine, phenobarbital, phenytoin  rifampin  St. John's Wort This list may not describe all possible interactions. Give your health care provider a list of all the medicines, herbs, non-prescription drugs, or dietary supplements you use. Also tell them if you smoke, drink alcohol, or use illegal drugs. Some items may interact with your medicine. What should I watch for while using this medicine? Visit your doctor or health care professional for regular checks on your progress. If you experience hot flashes or sweating while taking this medicine, avoid alcohol, smoking and drinks with caffeine. This may help to decrease these side effects. Do not become pregnant while taking this medicine or for 1 month after stopping it. Women should inform their doctor if they wish to become pregnant or think they might be pregnant. Women of child-bearing potential will need to have a negative pregnancy test before starting this medicine. There is a potential for serious side effects to an unborn child. Do not breast-feed an infant while taking this medicine or for 1 month after stopping it. Talk to your health care professional or pharmacist for more information. What side effects may I notice from receiving this medicine? Side effects that you should report to your doctor or health care professional as soon as possible:  any new or unusual symptoms  changes in  vision  fever  leg or arm swelling  pain in bones, joints, or muscles  pain in hips, back, ribs, arms, shoulders, or legs Side effects that usually do not require medical attention (report to your doctor or health care professional if they continue or are bothersome):  difficulty sleeping  headache  hot  flashes  sweating  unusually weak or tired This list may not describe all possible side effects. Call your doctor for medical advice about side effects. You may report side effects to FDA at 1-800-FDA-1088. Where should I keep my medicine? Keep out of the reach of children. Store at room temperature between 15 and 30 degrees C (59 and 86 degrees F). Throw away any unused medicine after the expiration date. NOTE: This sheet is a summary. It may not cover all possible information. If you have questions about this medicine, talk to your doctor, pharmacist, or health care provider.  2020 Elsevier/Gold Standard (2017-06-05 08:39:27)

## 2019-11-04 NOTE — Progress Notes (Signed)
[place]  Telephone:([phone] Fax:(336) [phone]    ID: Shelia Marks DOB: 1926-01-28  MR#: [id number]  RPR#:[id number]  Patient Care Team: Gayland Curry, DO as PCP - General (Geriatric Medicine) Rutherford Guys, MD as Consulting Physician (Ophthalmology) Magrinat, Virgie Dad, MD as Consulting Physician (Oncology) Jovita Kussmaul, MD as Consulting Physician (General Surgery) Jacelyn Pi, MD as Consulting Physician (Endocrinology) OTHER MD:   CHIEF COMPLAINT: Estrogen receptor positive breast cancer  CURRENT TREATMENT: Awaiting definitive surgery   INTERVAL HISTORY: Shelia Marks returns today for follow-up and treatment of her estrogen receptor positive breast cancer. She is accompanied by her sister, Shelia Marks.  She has continued on Anastrozole, and is due to change it to Exemestane in December, 2020.  Since her last visit with [place] she met with radiation oncology to discuss the role of adjuvant radiation.  Radiation was recommended to her and her sister Shelia Marks on 09/24/2019 and again on 10/09/2019 to improve her local/regional control.  She has declined adjuvant radiation.     REVIEW OF SYSTEMS: Shelia Marks has dementia.  When I asked her about her decisions about radiation she tells me she doesn't remember what she decided.  She says she forgot the discussions he had about radiation.  I asked her about her decision about not wanting life prolonging care with Dr. Mariea Clonts, she told me again that she didn't recall that discussion.  She says that she can't remember things or people, and wants help with decision making, because she doesn't know what to do.  Shelia Marks, her sister says that she is not comfortable with decision making because she wants the decisions to be Shelia Marks's and not hers.    Shelia Marks says she is not in pain and has healed from her breast cancer surgery.  She continues on Anastrozole with good tolerance.  A detailed ROS Was otherwise non contributory today.     BREAST CANCER HISTORY: From the original intake note:  "Shelia Marks" had bilateral screening mammography at the [facility] 06/07/2016 showing calcifications in the left breast. She was recalled  05/15/2016 for left diagnostic mammography. This showed the breast density to be category be. In the upper outer left breast there was an area of suspicious calcifications measuring 1.7 cm.  Biopsy of this area was obtained 05/21/2016, and showed (S [id number]) ductal carcinoma in situ, high-grade, estrogen receptor 100% positive, progesterone receptor 5% positive, WITH strong staining intensity.  Her subsequent history is as detailed below.   PAST MEDICAL HISTORY: Past Medical History:  Diagnosis Date  . Alzheimer's disease ([place])   . Cancer ([place])   . Disorder of bone and cartilage, unspecified   . Dizziness and giddiness   . Hyperlipidemia LDL goal < 100   . Hypertension   . Hypothyroidism   . Malignant neoplasm of thyroid gland ([place])   . Nontoxic uninodular goiter   . Other malaise and fatigue   . Other symptoms involving cardiovascular system   . Phlebitis and thrombophlebitis of unspecified site   . Senile osteoporosis   . Sleep related leg cramps   . Stroke ([place])   . Unspecified disorder of lipoid metabolism   . Unspecified hereditary and idiopathic peripheral neuropathy   . Varicose veins of lower extremities with inflammation     PAST SURGICAL HISTORY: Past Surgical History:  Procedure Laterality Date  . ABDOMINAL HYSTERECTOMY     DR HUGES  . BREAST BIOPSY Left 05/21/2016   malignant  . CATRACT SURGERY  2008  . COLON SURGERY  clipped polyp during colonoscopy  . KNEE SURGERY     right  . MASTECTOMY MODIFIED RADICAL Left 09/10/2019   Procedure: LEFT MASTECTOMY MODIFIED RADICAL;  Surgeon: Jovita Kussmaul, MD;  [facility];  Service: General;  Laterality: Left;  . PARATHYROID ADENOMA REMOVED  1983  . THYROID SURGERY  July 20, 2011    FAMILY HISTORY Family History   Problem Relation Age of Onset  . Diabetes Brother    The patient's father died from liver problems in his 53s. The patient's mother died in her 31s from "natural causes". The patient had no brothers. She had 6 sisters, 2 of whom died from complications of diabetes and one from a ruptured aneurysm. There is no history of breast or ovarian cancer in the family.    GYNECOLOGIC HISTORY:  No LMP recorded. Patient has had a hysterectomy.  Shelia Marks does not remember how she was when she had her first period. She never carried a child to term. She is status post hysterectomy and at least one ovary was removed. She never took hormone replacement.    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    ADVANCED DIRECTIVES: [name], Shelia Marks, is her healthcare power of attorney. Shelia Marks can be reached at [phone].   HEALTH MAINTENANCE: Social History   Tobacco Use  . Smoking status: Never Smoker  . Smokeless tobacco: Never Used  Substance Use Topics  . Alcohol use: No  . Drug use: No     No Known Allergies  Current Outpatient Medications  Medication Sig Dispense Refill  . acetaminophen (TYLENOL) 325 MG tablet Take 2 tablets (650 mg total) by mouth every 6 (six) hours as needed for mild pain, moderate pain, fever or headache.    . alendronate (FOSAMAX) 70 MG tablet TAKE 1 TABLET (70 MG TOTAL) BY MOUTH EVERY 7 (SEVEN) DAYS. TAKE WITH A FULL GLASS OF WATER ON AN EMPTY STOMACH. 12 tablet 1  . allopurinol (ZYLOPRIM) 300 MG tablet TAKE 1 TABLET EVERY DAY 90 tablet 1  . amLODipine (NORVASC) 10 MG tablet TAKE 1 TABLET EVERY DAY 90 tablet 1  . aspirin EC 81 MG tablet Take 1 tablet (81 mg total) by mouth daily. 30 tablet 3  . atenolol (TENORMIN) 25 MG tablet TAKE 1 TABLET EVERY DAY 90 tablet 1  . atorvastatin (LIPITOR) 40 MG tablet TAKE 1 TABLET EVERY DAY 90 tablet 0  . calcium carbonate (TUMS -  DOSED IN MG ELEMENTAL CALCIUM) 500 MG chewable tablet Chew 1 tablet by mouth as needed for indigestion or heartburn.    . Cholecalciferol (VITAMIN D) 50 MCG (2000 UT) CAPS Take 1 capsule by mouth 3 (three) times a week.    Marland Kitchen HYDROcodone-acetaminophen (NORCO/VICODIN) 5-325 MG tablet Take 1-2 tablets by mouth every 4 (four) hours as needed for severe pain (pain not relieved by plain Tylenol). 20 tablet 0  . hydrOXYzine (ATARAX/VISTARIL) 10 MG tablet Take 1 tablet (10 mg total) by mouth 3 (three) times daily as needed. 30 tablet 0  . iron polysaccharides (NIFEREX) 150 MG capsule Take 1 capsule (150 mg total) by mouth every other day. 15 capsule 3  . levothyroxine (SYNTHROID, LEVOTHROID) 112 MCG tablet Take 112 mcg by mouth daily before breakfast.     . lisinopril (ZESTRIL) 2.5 MG tablet TAKE 1 TABLET DAILY TO CONTROL BLOOD PRESSURE 90 tablet 1  . magnesium hydroxide (MILK  OF MAGNESIA) 400 MG/5ML suspension Take 5-10 mLs by mouth daily as needed for mild constipation.    . sertraline (ZOLOFT) 50 MG tablet TAKE ONE TABLET DAILY FOR DEPRESSION 90 tablet 1  . exemestane (AROMASIN) 25 MG tablet Take 1 tablet (25 mg total) by mouth daily after breakfast. 90 tablet 3   No current facility-administered medications for this visit.     OBJECTIVE: Vitals:   11/04/19 1230  BP: (!) 149/72  Pulse: 73  Resp: 16  Temp: 98.3 F (36.8 C)  SpO2: 100%     Body mass index is 29.53 kg/m.    ECOG FS:2 - Symptomatic, <50% confined to bed GENERAL: Patient is a well appearing female in no acute distress HEENT:  Sclerae anicteric.  Oropharynx clear and moist. No ulcerations or evidence of oropharyngeal candidiasis. Neck is supple.  NODES:  No cervical, supraclavicular, or axillary lymphadenopathy palpated.  BREAST EXAM:  S/p left mastectomy no sign of local recurrence.  LUNGS:  Clear to auscultation bilaterally.  No wheezes or rhonchi. HEART:  Regular rate and rhythm. No murmur appreciated. ABDOMEN:  Soft,  nontender.  Positive, normoactive bowel sounds. No organomegaly palpated. MSK:  No focal spinal tenderness to palpation. Full range of motion bilaterally in the upper extremities. EXTREMITIES:  No peripheral edema.   SKIN:  Clear with no obvious rashes or skin changes. No nail dyscrasia. NEURO:  Nonfocal. Forgetful.  Oriented to person/place only.       LAB RESULTS:  CMP     Component Value Date/Time   NA 143 11/04/2019 1145   NA 143 06/01/2016 0958   K 3.7 11/04/2019 1145   CL 109 11/04/2019 1145   CO2 23 11/04/2019 1145   GLUCOSE 141 (H) 11/04/2019 1145   BUN 17 11/04/2019 1145   BUN 21 06/01/2016 0958   CREATININE 0.95 11/04/2019 1145   CREATININE 1.10 (H) 08/11/2019 1051   CREATININE 0.99 (H) 02/25/2018 0937   CALCIUM 9.4 11/04/2019 1145   PROT 7.4 11/04/2019 1145   PROT 6.8 11/09/2015 1013   ALBUMIN 3.6 11/04/2019 1145   ALBUMIN 4.1 11/09/2015 1013   AST 38 11/04/2019 1145   AST 29 08/11/2019 1051   ALT 34 11/04/2019 1145   ALT 27 08/11/2019 1051   ALKPHOS 65 11/04/2019 1145   BILITOT 0.5 11/04/2019 1145   BILITOT 0.8 08/11/2019 1051   GFRNONAA 53 (L) 11/04/2019 1145   GFRNONAA 44 (L) 08/11/2019 1051   GFRNONAA 51 (L) 02/25/2018 0937   GFRAA >60 11/04/2019 1145   GFRAA 51 (L) 08/11/2019 1051   GFRAA 59 (L) 02/25/2018 0937    INo results found for: SPEP, UPEP  Lab Results  Component Value Date   WBC 4.4 11/04/2019   NEUTROABS 2.9 11/04/2019   HGB 11.4 (L) 11/04/2019   HCT 36.7 11/04/2019   MCV 94.6 11/04/2019   PLT 198 11/04/2019      Chemistry      Component Value Date/Time   NA 143 11/04/2019 1145   NA 143 06/01/2016 0958   K 3.7 11/04/2019 1145   CL 109 11/04/2019 1145   CO2 23 11/04/2019 1145   BUN 17 11/04/2019 1145   BUN 21 06/01/2016 0958   CREATININE 0.95 11/04/2019 1145   CREATININE 1.10 (H) 08/11/2019 1051   CREATININE 0.99 (H) 02/25/2018 0937      Component Value Date/Time   CALCIUM 9.4 11/04/2019 1145   ALKPHOS 65 11/04/2019  1145   AST 38 11/04/2019 1145   AST 29 08/11/2019  1051   ALT 34 11/04/2019 1145   ALT 27 08/11/2019 1051   BILITOT 0.5 11/04/2019 1145   BILITOT 0.8 08/11/2019 1051       No results found for: LABCA2  No components found for: DUKRC381  No results for input(s): INR in the last 168 hours.  Urinalysis    Component Value Date/Time   COLORURINE YELLOW 09/12/2019 1413   APPEARANCEUR CLEAR 09/12/2019 1413   LABSPEC 1.012 09/12/2019 1413   PHURINE 6.0 09/12/2019 1413   GLUCOSEU NEGATIVE 09/12/2019 1413   HGBUR NEGATIVE 09/12/2019 1413   BILIRUBINUR NEGATIVE 09/12/2019 1413   KETONESUR NEGATIVE 09/12/2019 1413   PROTEINUR NEGATIVE 09/12/2019 1413   UROBILINOGEN 0.2 03/05/2014 2055   NITRITE NEGATIVE 09/12/2019 1413   LEUKOCYTESUR NEGATIVE 09/12/2019 1413    STUDIES: No results found.   ELIGIBLE FOR AVAILABLE RESEARCH PROTOCOL: no   ASSESSMENT: 85 y.o. [place] woman status post left breast upper outer quadrant biopsy 05/21/2016 for ductal carcinoma in situ, high-grade, estrogen and progesterone receptor positive  (1) opted against surgery   (2) started anastrozole neoadjuvantly 06/13/2016, discontinued June 2018 due to osteoporosis concerns  (a)  bone density 11/16/2016 shows a T score of -4.7 (osteoporosis)-on fosamax.   (3) mammography/ultrasonography 06/10/2018 shows evidence of disease progression, possible lymph node extension  (4) letrozole started 06/27/2018   (a) discontinued November 2019 with evidence of progression  (b) restarted 09/2019, to change to Exemestane in 12/2019  (5) fulvestrant started 11/21/2018  (a) mammography and ultrasonography 07/24/2019 shows evidence of disease progression  (6) Left radical mastectomy and node dissection on 09/10/2019: showing a stage IIIA, T2, N2a, invasive ductal carcinoma, grade 3, margins negative, ER+,PR-, HER-2-, Ki-67 90%  (a) 4 out of 14 lymph nodes positive for macrometastases  (b) 1 out of 14 lymph nodes  positive for micrometastases  (c) CT chest on 08/26/2019 negative for metastatic disease  (7) Adjuvant radiation recommended on 09/24/2019 and again on 10/09/2019, declined by patient   PLAN: Shelia Marks is doing moderately well.  She has no sign of breast cancer recurrence.  She is taking Anastrozole with good tolerance.  She will continue this until she completes her current bottle, at that point she will transition to Exemestane.  I sent this in today and explained this to her sister Shelia Marks.    We had a long discussion about Demecia's cognitive status.  I question her ability to make informed decisions due to the level of dementia she has, and her forgetfulness of recent conversations.  I am going to reach out to [place] in social work to review our concern with her primary care team.    Shelia Marks is confused about adjuvant radiation therapy.  After talking with her and her sister about it, they decided they wanted to discuss it further.  I gave them the name of the radiation oncology physicians assistant, Shona Simpson to call for further guidance on whether or not it would be of any benefit at this point.  They understand this.    Shelia Marks will return in 3 months for labs and f/u with Dr. Jana Hakim.  She was recommended to continue with the appropriate pandemic precautions. She knows to call for any questions that may arise between now and her next appointment.  We are happy to see her sooner if needed.  A total of (30) minutes of face-to-face time was spent with this patient with greater than 50% of that time in counseling and care-coordination.    Wilber Bihari, NP  11/04/19 2:02 PM Medical Oncology and Hematology [facility] [address] [place], [place] [address] Tel. [phone]    Fax. [phone]

## 2019-11-04 NOTE — Progress Notes (Signed)
[place] CSW Progress Notes  Request received from NP [place] to provide resources to assist in determining patient's capacity to make medical decisions regarding her treatment.  Reviewed chart from PCP [facility]) - they have seen her over time and have assessed cognitive impairment as part of her regular health care visits.  Message sent to NP and PCP at this practice, [facility] NP informed.  Edwyna Shell, LCSW Clinical Social Worker Phone:  [phone]

## 2019-11-05 ENCOUNTER — Telehealth: Payer: Self-pay | Admitting: Oncology

## 2019-11-05 NOTE — Telephone Encounter (Signed)
I talk with patient regarding schedule  

## 2019-11-19 ENCOUNTER — Other Ambulatory Visit: Payer: Self-pay | Admitting: Internal Medicine

## 2019-11-19 NOTE — Telephone Encounter (Signed)
No Uric Acid level within a few years. Updated appointment notes reflecting patient to have at next appointment

## 2019-11-25 ENCOUNTER — Encounter: Payer: Self-pay | Admitting: Oncology

## 2019-11-25 NOTE — Progress Notes (Signed)
Attempted to reach patient via phone regarding assistance with Aromasin referred by [facility] RN . I was not able to leave message on either number listed.  Plan placed for assistance. Notified RN.

## 2019-12-03 ENCOUNTER — Other Ambulatory Visit: Payer: Self-pay | Admitting: *Deleted

## 2019-12-21 ENCOUNTER — Telehealth: Payer: Self-pay | Admitting: *Deleted

## 2019-12-21 ENCOUNTER — Encounter: Payer: Self-pay | Admitting: Oncology

## 2019-12-21 DIAGNOSIS — Z170 Estrogen receptor positive status [ER+]: Secondary | ICD-10-CM

## 2019-12-21 DIAGNOSIS — C50812 Malignant neoplasm of overlapping sites of left female breast: Secondary | ICD-10-CM

## 2019-12-21 MED ORDER — EXEMESTANE 25 MG PO TABS: 25.0000 mg | ORAL_TABLET | Freq: Every day | ORAL | 3 refills | Status: DC

## 2019-12-21 MED FILL — EXEMESTANE 25 MG TABLET: 25 | 90 days supply | Qty: 90 | Fill #0

## 2019-12-21 NOTE — Telephone Encounter (Signed)
This RN verified with [place]- and verified pt has been set up and can pick up prescriptions at no cost at the [facility].  This RN called the above to pt's caregiver - Bryon Lions who verbalized understanding.  Pharmacy information given to [place].

## 2019-12-21 NOTE — Progress Notes (Signed)
[employer] grant was set up on 11/25/19 for patient to receive Aromasin at [facility].

## 2019-12-23 ENCOUNTER — Other Ambulatory Visit: Payer: Self-pay | Admitting: Internal Medicine

## 2020-01-06 ENCOUNTER — Other Ambulatory Visit: Payer: Self-pay | Admitting: Internal Medicine

## 2020-01-18 ENCOUNTER — Ambulatory Visit (INDEPENDENT_AMBULATORY_CARE_PROVIDER_SITE_OTHER): Payer: Medicare Other | Admitting: Internal Medicine

## 2020-01-18 ENCOUNTER — Other Ambulatory Visit: Payer: Self-pay

## 2020-01-18 ENCOUNTER — Encounter: Payer: Self-pay | Admitting: Internal Medicine

## 2020-01-18 VITALS — BP 142/78 | HR 59 | Temp 97.7°F | Ht 61.0 in | Wt 155.0 lb

## 2020-01-18 DIAGNOSIS — Z170 Estrogen receptor positive status [ER+]: Secondary | ICD-10-CM

## 2020-01-18 DIAGNOSIS — M1A9XX0 Chronic gout, unspecified, without tophus (tophi): Secondary | ICD-10-CM

## 2020-01-18 DIAGNOSIS — D649 Anemia, unspecified: Secondary | ICD-10-CM | POA: Diagnosis not present

## 2020-01-18 DIAGNOSIS — G2581 Restless legs syndrome: Secondary | ICD-10-CM

## 2020-01-18 DIAGNOSIS — Z9012 Acquired absence of left breast and nipple: Secondary | ICD-10-CM | POA: Diagnosis not present

## 2020-01-18 DIAGNOSIS — C50812 Malignant neoplasm of overlapping sites of left female breast: Secondary | ICD-10-CM

## 2020-01-18 DIAGNOSIS — F0150 Vascular dementia without behavioral disturbance: Secondary | ICD-10-CM

## 2020-01-18 DIAGNOSIS — K5901 Slow transit constipation: Secondary | ICD-10-CM | POA: Diagnosis not present

## 2020-01-18 NOTE — Progress Notes (Signed)
Location:  [facility] clinic  Provider: Dr. Hollace Kinnier  Goals of Care:  Advanced Directives 01/18/2020  Does Patient Have a Medical Advance Directive? No  Type of Advance Directive -  Does patient want to make changes to medical advance directive? -  Copy of Jeannette in Chart? -  Would patient like information on creating a medical advance directive? No - Patient declined  Pre-existing out of facility DNR order (yellow form or pink MOST form) -     Chief Complaint  Patient presents with  . Medical Management of Chronic Issues    3 month follow up for medical management/ uric acid level, [name] sister is with her , l side abdomen feels swollen     HPI: Patient is a 86 y.o. female seen today for medical management of chronic diseases.    Sister [name] present for visit.   Will see Dr. Jana Hakim in February for routine follow up. Has not made additional decisions about radiation.   Has seen Dr. Marlou Starks within the past month. Claims her surgical area if free of infection and healing well. Her only complaints is some sharp nerve pain near surgical area. When this occurs she will massage the area to help with pain. Not taking hydrocodone anymore. Does not take tylenol on a daily basis.   2-3 meals daily. Concerned about her bowels. She has had a long history of constipation. Milk of magnesia helps when constipation is severe. Diet is rich in fruits and vegetables. Hydration is poor and sometimes her urine is dark yellow. Would like to know if she should take anything else to more her bowels more often.   Ambulates with a front rolling walker. Denies any recent injuries or falls.   Takes medicine every day. Asking if she could reduce the amount of medicine she is taking.   She has been having pain at night in her legs. Does not describe it as restless, but pain that shoots up and down. Not taking any medication to help. These episodes are occurring a few times a week.   Past  Medical History:  Diagnosis Date  . Alzheimer's disease ([place])   . Cancer ([place])   . Disorder of bone and cartilage, unspecified   . Dizziness and giddiness   . Hyperlipidemia LDL goal < 100   . Hypertension   . Hypothyroidism   . Malignant neoplasm of thyroid gland ([place])   . Nontoxic uninodular goiter   . Other malaise and fatigue   . Other symptoms involving cardiovascular system   . Phlebitis and thrombophlebitis of unspecified site   . Senile osteoporosis   . Sleep related leg cramps   . Stroke ([place])   . Unspecified disorder of lipoid metabolism   . Unspecified hereditary and idiopathic peripheral neuropathy   . Varicose veins of lower extremities with inflammation     Past Surgical History:  Procedure Laterality Date  . ABDOMINAL HYSTERECTOMY     DR HUGES  . BREAST BIOPSY Left 05/21/2016   malignant  . CATRACT SURGERY  2008  . COLON SURGERY     clipped polyp during colonoscopy  . KNEE SURGERY     right  . MASTECTOMY MODIFIED RADICAL Left 09/10/2019   Procedure: LEFT MASTECTOMY MODIFIED RADICAL;  Surgeon: Jovita Kussmaul, MD;  Location: [facility];  Service: General;  Laterality: Left;  . PARATHYROID ADENOMA REMOVED  1983  . THYROID SURGERY  July 20, 2011    No Known Allergies  Outpatient  Encounter Medications as of 01/18/2020  Medication Sig  . acetaminophen (TYLENOL) 325 MG tablet Take 2 tablets (650 mg total) by mouth every 6 (six) hours as needed for mild pain, moderate pain, fever or headache.  . alendronate (FOSAMAX) 70 MG tablet TAKE 1 TABLET (70 MG TOTAL) EVERY 7 (SEVEN) DAYS. TAKE WITH A FULL GLASS OF WATER ON AN EMPTY STOMACH.  Marland Kitchen allopurinol (ZYLOPRIM) 300 MG tablet TAKE 1 TABLET EVERY DAY  . amLODipine (NORVASC) 10 MG tablet TAKE 1 TABLET EVERY DAY  . aspirin EC 81 MG tablet Take 1 tablet (81 mg total) by mouth daily.  Marland Kitchen atenolol (TENORMIN) 25 MG tablet TAKE 1 TABLET EVERY DAY  . atorvastatin (LIPITOR) 40 MG tablet TAKE 1 TABLET EVERY DAY  . calcium carbonate  (TUMS - DOSED IN MG ELEMENTAL CALCIUM) 500 MG chewable tablet Chew 1 tablet by mouth as needed for indigestion or heartburn.  . Cholecalciferol (VITAMIN D) 50 MCG (2000 UT) CAPS Take 1 capsule by mouth 3 (three) times a week.  Marland Kitchen exemestane (AROMASIN) 25 MG tablet Take 1 tablet (25 mg total) by mouth daily after breakfast.  . HYDROcodone-acetaminophen (NORCO/VICODIN) 5-325 MG tablet Take 1-2 tablets by mouth every 4 (four) hours as needed for severe pain (pain not relieved by plain Tylenol).  . hydrOXYzine (ATARAX/VISTARIL) 10 MG tablet Take 1 tablet (10 mg total) by mouth 3 (three) times daily as needed.  . iron polysaccharides (NIFEREX) 150 MG capsule Take 1 capsule (150 mg total) by mouth every other day.  . levothyroxine (SYNTHROID, LEVOTHROID) 112 MCG tablet Take 112 mcg by mouth daily before breakfast.   . lisinopril (ZESTRIL) 2.5 MG tablet TAKE 1 TABLET DAILY TO CONTROL BLOOD PRESSURE  . magnesium hydroxide (MILK OF MAGNESIA) 400 MG/5ML suspension Take 5-10 mLs by mouth daily as needed for mild constipation.  . sertraline (ZOLOFT) 50 MG tablet TAKE ONE TABLET DAILY FOR DEPRESSION   No facility-administered encounter medications on file as of 01/18/2020.    Review of Systems:  Review of Systems  Constitutional: Negative for activity change, appetite change and fatigue.  HENT: Positive for hearing loss. Negative for dental problem and trouble swallowing.   Eyes: Negative for photophobia and visual disturbance.  Respiratory: Negative for cough and shortness of breath.   Cardiovascular: Positive for leg swelling. Negative for chest pain and palpitations.  Gastrointestinal: Positive for constipation. Negative for abdominal pain, diarrhea and nausea.  Endocrine: Negative for polydipsia, polyphagia and polyuria.  Genitourinary: Positive for frequency. Negative for dysuria, hematuria and vaginal bleeding.  Musculoskeletal: Positive for arthralgias.  Skin:       Dryness  Neurological: Positive  for weakness. Negative for dizziness and light-headedness.  Psychiatric/Behavioral: Negative for dysphoric mood and sleep disturbance. The patient is not nervous/anxious.     Health Maintenance  Topic Date Due  . TETANUS/TDAP  06/23/2028  . INFLUENZA VACCINE  Completed  . DEXA SCAN  Completed  . PNA vac Low Risk Adult  Completed    Physical Exam: Vitals:   01/18/20 1017  BP: (!) 142/78  Pulse: (!) 59  Temp: 97.7 F (36.5 C)  TempSrc: Temporal  SpO2: 99%  Weight: 155 lb (70.3 kg)  Height: 5\' 1"  (1.549 m)   Body mass index is 29.29 kg/m. Physical Exam Vitals reviewed.  Constitutional:      Appearance: Normal appearance. She is normal weight.  Cardiovascular:     Rate and Rhythm: Normal rate and regular rhythm.     Pulses: Normal pulses.  Heart sounds: Normal heart sounds. No murmur.  Pulmonary:     Effort: Pulmonary effort is normal. No respiratory distress.     Breath sounds: Normal breath sounds. No wheezing.  Chest:     Chest wall: No mass or tenderness.     Breasts:        Left: No swelling, mass, skin change or tenderness.    Abdominal:     General: Bowel sounds are normal. There is no distension.     Palpations: Abdomen is soft.     Tenderness: There is no abdominal tenderness.  Musculoskeletal:     Right lower leg: Edema present.     Left lower leg: Edema present.  Skin:    General: Skin is warm and dry.     Capillary Refill: Capillary refill takes less than 2 seconds.  Neurological:     General: No focal deficit present.     Mental Status: She is alert and oriented to person, place, and time. Mental status is at baseline.     Motor: Weakness present.     Gait: Gait abnormal.  Psychiatric:        Mood and Affect: Mood normal.        Behavior: Behavior normal.        Thought Content: Thought content normal.        Judgment: Judgment normal.     Labs reviewed: Basic Metabolic Panel: Recent Labs    09/02/19 1230 09/23/19 1105 11/04/19 1145    NA 141 142 143  K 4.3 4.3 3.7  CL 106 110 109  CO2 24 25 23   GLUCOSE 86 90 141*  BUN 17 19 17   CREATININE 1.06* 0.90 0.95  CALCIUM 9.7 9.4 9.4   Liver Function Tests: Recent Labs    08/11/19 1051 09/23/19 1105 11/04/19 1145  AST 29 23 38  ALT 27 18 34  ALKPHOS 52 63 65  BILITOT 0.8 0.6 0.5  PROT 7.5 6.9 7.4  ALBUMIN 4.0 3.5 3.6   No results for input(s): LIPASE, AMYLASE in the last 8760 hours. No results for input(s): AMMONIA in the last 8760 hours. CBC: Recent Labs    08/11/19 1051 08/11/19 1051 09/10/19 0930 09/23/19 1105 11/04/19 1145  WBC 4.0   < > 4.5 5.3 4.4  NEUTROABS 2.4  --   --  3.2 2.9  HGB 11.4*   < > 12.5 10.1* 11.4*  HCT 35.9*   < > 40.6 32.3* 36.7  MCV 94.2   < > 96.0 96.1 94.6  PLT 184   < > 189 181 198   < > = values in this interval not displayed.   Lipid Panel: No results for input(s): CHOL, HDL, LDLCALC, TRIG, CHOLHDL, LDLDIRECT in the last 8760 hours. Lab Results  Component Value Date   HGBA1C 5.0 11/09/2015    Procedures since last visit: No results found.  Assessment/Plan 1. Malignant neoplasm of overlapping sites of left breast in female, estrogen receptor positive ([place]) - followed by Dr. Jana Hakim - at this time is she waiting further treatment recommendations, next follow-up scheduled for February 2021  2. S/P mastectomy, left - followed by Dr. Marlou Starks - Site C/D/I, no signs of infection present  3. Vascular dementia without behavioral disturbance (HCC) - stable at this time, no incidents of behavioral outbursts or hallucinations  4. Constipation, slow transit - suspect poor hydration as main cause - encourage increased water intake, 6-8 glasses of 8oz water daily - may add grapes, prunes, or raisins  to diet  - may take colace 2 tablets daily if stools are hard   5. Chronic gout without tophus, unspecified cause, unspecified site - stable with medication, no new flares - uric acid- today  6. Restless legs syndrome  (RLS) - already taking niferex, do not suspect cause is low iron - cbc with differential/platelets- today - basic metabolic panel- today - Iron, TIBC, and ferritin- today     Labs/tests ordered:  uric acid, cbc with differential/platetlets, basic metabolic panel, Iron,TIBC and ferrition- today Next appt:  4 week follow up

## 2020-01-18 NOTE — Patient Instructions (Signed)
Please increase your water intake--6-8 8oz glasses of water per day.   Add more prunes or raisins or grapes to your regimen to help you move your bowels. If stools are hard, you may take colace 2 tablets daily.

## 2020-01-19 LAB — CBC WITH DIFFERENTIAL/PLATELET
Absolute Monocytes: 328 cells/uL (ref 200–950)
Basophils Absolute: 39 cells/uL (ref 0–200)
Basophils Relative: 0.8 %
Eosinophils Absolute: 78 cells/uL (ref 15–500)
Eosinophils Relative: 1.6 %
HCT: 36.5 % (ref 35.0–45.0)
Hemoglobin: 11.6 g/dL — ABNORMAL LOW (ref 11.7–15.5)
Lymphs Abs: 1318 cells/uL (ref 850–3900)
MCH: 29.4 pg (ref 27.0–33.0)
MCHC: 31.8 g/dL — ABNORMAL LOW (ref 32.0–36.0)
MCV: 92.4 fL (ref 80.0–100.0)
MPV: 12.9 fL — ABNORMAL HIGH (ref 7.5–12.5)
Monocytes Relative: 6.7 %
Neutro Abs: 3136 cells/uL (ref 1500–7800)
Neutrophils Relative %: 64 %
Platelets: 229 10*3/uL (ref 140–400)
RBC: 3.95 10*6/uL (ref 3.80–5.10)
RDW: 14.3 % (ref 11.0–15.0)
Total Lymphocyte: 26.9 %
WBC: 4.9 10*3/uL (ref 3.8–10.8)

## 2020-01-19 LAB — BASIC METABOLIC PANEL
BUN/Creatinine Ratio: 14 (calc) (ref 6–22)
BUN: 13 mg/dL (ref 7–25)
CO2: 28 mmol/L (ref 20–32)
Calcium: 9.5 mg/dL (ref 8.6–10.4)
Chloride: 106 mmol/L (ref 98–110)
Creat: 0.9 mg/dL — ABNORMAL HIGH (ref 0.60–0.88)
Glucose, Bld: 91 mg/dL (ref 65–99)
Potassium: 3.3 mmol/L — ABNORMAL LOW (ref 3.5–5.3)
Sodium: 143 mmol/L (ref 135–146)

## 2020-01-19 LAB — IRON,TIBC AND FERRITIN PANEL
%SAT: 25 % (calc) (ref 16–45)
Ferritin: 54 ng/mL (ref 16–288)
Iron: 79 ug/dL (ref 45–160)
TIBC: 321 mcg/dL (calc) (ref 250–450)

## 2020-01-19 LAB — URIC ACID: Uric Acid, Serum: 2.7 mg/dL (ref 2.5–7.0)

## 2020-01-19 NOTE — Progress Notes (Signed)
Please notify her sister, [name]: Blood count has improved a bit more on her iron.  Very close to normal. Iron panel is in normal range now. Kidneys are stable.   Potassium was a little bit low.  I recommend she increase intake of bananas, oranges, sweet potatoes that are high in potassium. Uric acid is normal while taking her allopurinol for gout.

## 2020-01-20 ENCOUNTER — Other Ambulatory Visit: Payer: Self-pay | Admitting: Internal Medicine

## 2020-02-02 DIAGNOSIS — C50912 Malignant neoplasm of unspecified site of left female breast: Secondary | ICD-10-CM | POA: Diagnosis not present

## 2020-02-03 NOTE — Progress Notes (Signed)
[place] [facility]  Telephone:([phone]) [phone] [phone]) [phone]    ID: Shelia Marks DOB: 1926/05/27  MR#: [id number]  TOI#:[id number]  Patient Care Team: Gayland Curry, DO as PCP - General (Geriatric Medicine) Rutherford Guys, MD as Consulting Physician (Ophthalmology) Sheylin Scharnhorst, Virgie Dad, MD as Consulting Physician (Oncology) Jovita Kussmaul, MD as Consulting Physician (General Surgery) Jacelyn Pi, MD as Consulting Physician (Endocrinology) OTHER MD:   CHIEF COMPLAINT: Estrogen receptor positive breast cancer (s/p left mastectomy)  CURRENT TREATMENT: exemestane   INTERVAL HISTORY: Shelia Marks returns today for follow-up of her estrogen receptor positive breast cancer. She is accompanied by her sister, Shelia Marks.  She was switched to exemestane in 12/2019.  She is tolerating this with no side effects that she is aware of.  In particular she is not having any hot flashes, which is the most common problem.   REVIEW OF SYSTEMS: [name] saw Dr. Marlou Starks recently.  According to the patient's sister Dr. Marlou Starks found a chest wall recurrence and offered to remove it so we would know what we are dealing with.  The patient at that time stated she did not want to be cut.  Accordingly the problem remains to be resolved.  Aside from that her functional status appears stable.  She is comfortable, "not doing anything" at home.  In particular she has no pain issues at this point.  Detailed review of systems was otherwise stable.   BREAST CANCER HISTORY: From the original intake note:  "Shelia Marks" had bilateral screening mammography at the [facility] 06/07/2016 showing calcifications in the left breast. She was recalled  05/15/2016 for left diagnostic mammography. This showed the breast density to be category be. In the upper outer left breast there was an area of suspicious calcifications measuring 1.7 cm.  Biopsy of this area was obtained 05/21/2016, and showed (S [phone]) ductal  carcinoma in situ, high-grade, estrogen receptor 100% positive, progesterone receptor 5% positive, WITH strong staining intensity.  Her subsequent history is as detailed below.   PAST MEDICAL HISTORY: Past Medical History:  Diagnosis Date  . Alzheimer's disease ([place])   . Cancer ([place])   . Disorder of bone and cartilage, unspecified   . Dizziness and giddiness   . Hyperlipidemia LDL goal < 100   . Hypertension   . Hypothyroidism   . Malignant neoplasm of thyroid gland ([place])   . Nontoxic uninodular goiter   . Other malaise and fatigue   . Other symptoms involving cardiovascular system   . Phlebitis and thrombophlebitis of unspecified site   . Senile osteoporosis   . Sleep related leg cramps   . Stroke ([place])   . Unspecified disorder of lipoid metabolism   . Unspecified hereditary and idiopathic peripheral neuropathy   . Varicose veins of lower extremities with inflammation     PAST SURGICAL HISTORY: Past Surgical History:  Procedure Laterality Date  . ABDOMINAL HYSTERECTOMY     DR HUGES  . BREAST BIOPSY Left 05/21/2016   malignant  . CATRACT SURGERY  2008  . COLON SURGERY     clipped polyp during colonoscopy  . KNEE SURGERY     right  . MASTECTOMY MODIFIED RADICAL Left 09/10/2019   Procedure: LEFT MASTECTOMY MODIFIED RADICAL;  Surgeon: Jovita Kussmaul, MD;  Location: [facility];  Service: General;  Laterality: Left;  . PARATHYROID ADENOMA REMOVED  1983  . THYROID SURGERY  July 20, 2011    FAMILY HISTORY Family History  Problem Relation Age of Onset  . Diabetes Brother  The patient's father died from liver problems in his 26s. The patient's mother died in her 18s from "natural causes". The patient had no brothers. She had 6 sisters, 2 of whom died from complications of diabetes and one from a ruptured aneurysm. There is no history of breast or ovarian cancer in the family.    GYNECOLOGIC HISTORY:  No LMP recorded. Patient has had a hysterectomy.  Shelia Marks does not  remember how she was when she had her first period. She never carried a child to term. She is status post hysterectomy and at least one ovary was removed. She never took hormone replacement.    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    ADVANCED DIRECTIVES: [name], Shelia Marks, is her healthcare power of attorney. Shelia Marks can be reached at [phone].   HEALTH MAINTENANCE: Social History   Tobacco Use  . Smoking status: Never Smoker  . Smokeless tobacco: Never Used  Substance Use Topics  . Alcohol use: No  . Drug use: No     No Known Allergies  Current Outpatient Medications  Medication Sig Dispense Refill  . acetaminophen (TYLENOL) 325 MG tablet Take 2 tablets (650 mg total) by mouth every 6 (six) hours as needed for mild pain, moderate pain, fever or headache.    . alendronate (FOSAMAX) 70 MG tablet TAKE 1 TABLET (70 MG TOTAL) EVERY 7 (SEVEN) DAYS. TAKE WITH A FULL GLASS OF WATER ON AN EMPTY STOMACH. 12 tablet 1  . allopurinol (ZYLOPRIM) 300 MG tablet TAKE 1 TABLET EVERY DAY 90 tablet 1  . amLODipine (NORVASC) 10 MG tablet TAKE 1 TABLET EVERY DAY 90 tablet 1  . aspirin EC 81 MG tablet Take 1 tablet (81 mg total) by mouth daily. 30 tablet 3  . atenolol (TENORMIN) 25 MG tablet TAKE 1 TABLET EVERY DAY 90 tablet 1  . atorvastatin (LIPITOR) 40 MG tablet TAKE 1 TABLET EVERY DAY 90 tablet 0  . calcium carbonate (TUMS - DOSED IN MG ELEMENTAL CALCIUM) 500 MG chewable tablet Chew 1 tablet by mouth as needed for indigestion or heartburn.    . Cholecalciferol (VITAMIN D) 50 MCG (2000 UT) CAPS Take 1 capsule by mouth 3 (three) times a week.    Marland Kitchen exemestane (AROMASIN) 25 MG tablet Take 1 tablet (25 mg total) by mouth daily after breakfast. 90 tablet 3  . HYDROcodone-acetaminophen (NORCO/VICODIN) 5-325 MG tablet Take 1-2 tablets by mouth every 4 (four) hours as needed for severe pain (pain  not relieved by plain Tylenol). 20 tablet 0  . hydrOXYzine (ATARAX/VISTARIL) 10 MG tablet Take 1 tablet (10 mg total) by mouth 3 (three) times daily as needed. 30 tablet 0  . iron polysaccharides (NIFEREX) 150 MG capsule Take 1 capsule (150 mg total) by mouth every other day. 15 capsule 3  . levothyroxine (SYNTHROID, LEVOTHROID) 112 MCG tablet Take 112 mcg by mouth daily before breakfast.     . lisinopril (ZESTRIL) 2.5 MG tablet TAKE 1 TABLET DAILY TO CONTROL BLOOD PRESSURE 90 tablet 1  . magnesium hydroxide (MILK OF MAGNESIA) 400 MG/5ML suspension Take 5-10 mLs by mouth daily as needed for mild constipation.    . sertraline (ZOLOFT) 50 MG tablet TAKE ONE TABLET DAILY FOR DEPRESSION 90 tablet 1   No current facility-administered medications for this visit.    OBJECTIVE: Older African-American woman examined in a wheelchair Vitals:   02/04/20 1118  BP: (!) 153/72  Pulse: 72  Resp: 18  Temp: 98.2 F (36.8 C)  SpO2: 100%     Body mass index is 29.08 kg/m.    ECOG FS:2 - Symptomatic, <50% confined to bed  Sclerae unicteric, EOMs intact Wearing a mask No cervical or supraclavicular adenopathy Lungs no rales or rhonchi Heart regular rate and rhythm Abd soft, nontender, positive bowel sounds MSK no focal spinal tenderness, no upper extremity lymphedema Neuro: nonfocal, well oriented, appropriate affect Breasts: The right breast is unremarkable.  The left breast is status post mastectomy.  The incision is irregular.  There is no dehiscence or swelling and no erythema.  I do not palpate an obvious local recurrence but the patient was not completely undressed for this visit.   LAB RESULTS:  CMP     Component Value Date/Time   NA 140 02/04/2020 1019   NA 143 06/01/2016 0958   K 3.9 02/04/2020 1019   CL 105 02/04/2020 1019   CO2 27 02/04/2020 1019   GLUCOSE 99 02/04/2020 1019   BUN 21 02/04/2020 1019   BUN 21 06/01/2016 0958   CREATININE 1.13 (H) 02/04/2020 1019   CREATININE 0.90  (H) 01/18/2020 1125   CALCIUM 9.8 02/04/2020 1019   PROT 7.4 02/04/2020 1019   PROT 6.8 11/09/2015 1013   ALBUMIN 3.9 02/04/2020 1019   ALBUMIN 4.1 11/09/2015 1013   AST 29 02/04/2020 1019   ALT 27 02/04/2020 1019   ALKPHOS 61 02/04/2020 1019   BILITOT 0.6 02/04/2020 1019   GFRNONAA 42 (L) 02/04/2020 1019   GFRNONAA 51 (L) 02/25/2018 0937   GFRAA 49 (L) 02/04/2020 1019   GFRAA 59 (L) 02/25/2018 0937    INo results found for: SPEP, UPEP  Lab Results  Component Value Date   WBC 4.2 02/04/2020   NEUTROABS 2.9 02/04/2020   HGB 11.0 (L) 02/04/2020   HCT 35.7 (L) 02/04/2020   MCV 93.9 02/04/2020   PLT 206 02/04/2020      Chemistry      Component Value Date/Time   NA 140 02/04/2020 1019   NA 143 06/01/2016 0958   K 3.9 02/04/2020 1019   CL 105 02/04/2020 1019   CO2 27 02/04/2020 1019   BUN 21 02/04/2020 1019   BUN 21 06/01/2016 0958   CREATININE 1.13 (H) 02/04/2020 1019   CREATININE 0.90 (H) 01/18/2020 1125      Component Value Date/Time   CALCIUM 9.8 02/04/2020 1019   ALKPHOS 61 02/04/2020 1019   AST 29 02/04/2020 1019   ALT 27 02/04/2020 1019   BILITOT 0.6 02/04/2020 1019       No results found for: LABCA2  No components found for: LABCA125  No results for input(s): INR in the last 168 hours.  Urinalysis    Component Value Date/Time   COLORURINE YELLOW 09/12/2019 1413   APPEARANCEUR CLEAR 09/12/2019 1413   LABSPEC 1.012 09/12/2019 1413   PHURINE 6.0 09/12/2019 1413   GLUCOSEU NEGATIVE 09/12/2019 1413   HGBUR NEGATIVE 09/12/2019 1413   BILIRUBINUR NEGATIVE 09/12/2019 1413   KETONESUR NEGATIVE 09/12/2019 1413   PROTEINUR NEGATIVE 09/12/2019 1413   UROBILINOGEN 0.2 03/05/2014 2055   NITRITE NEGATIVE 09/12/2019 1413   LEUKOCYTESUR NEGATIVE 09/12/2019 1413    STUDIES: No results found.   ELIGIBLE FOR AVAILABLE RESEARCH PROTOCOL: no   ASSESSMENT: 86 y.o. [place] woman status post left breast upper outer quadrant biopsy 05/21/2016 for ductal  carcinoma in situ, high-grade, estrogen and progesterone receptor positive  (1) opted against  surgery   (2) started anastrozole neoadjuvantly 06/13/2016, discontinued June 2018 due to osteoporosis concerns  (a)  bone density 11/16/2016 shows a T score of -4.7 (osteoporosis)-on fosamax.   (3) mammography/ultrasonography 06/10/2018 shows evidence of disease progression, possible lymph node extension  (4) letrozole started 06/27/2018   (a) discontinued November 2019 with evidence of progression  (b) restarted 09/2019, to change to Exemestane in 12/2019  (5) fulvestrant started 11/21/2018  (a) mammography and ultrasonography 07/24/2019 shows evidence of disease progression  (6) Left modified radical mastectomy on 09/10/2019: showing a stage IIIA, T2, N2a, invasive ductal carcinoma, grade 3, margins negative, estrogen receptor positive, progesterone receptor and HER-2 negative, with an MIB-1 of 90%  (a) 4 out of 14 lymph nodes positive for macrometastases  (b) 1 out of 14 lymph nodes positive for micrometastases  (c) CT chest on 08/26/2019 negative for metastatic disease  (7) Adjuvant radiation recommended on 09/24/2019 and again on 10/09/2019, declined by patient   PLAN: Shelia Marks is now 5 months out from definitive surgery for her breast cancer.  She is taking exemestane and tolerating it well.  There is a concern regarding possible local recurrence on the chest wall.  Dr. Marlou Starks has offered to do an excisional biopsy of the area in question.  The patient initially refused.  I strongly urged her today to go back to Dr. Marlou Starks and get it done so we know what we are dealing with.  If she does have evidence of local recurrence then we will reassess the question of radiation.  Tentatively I have scheduled her to return to see me in approximately 1 month.  Total encounter time 25 minutes.Sarajane Jews C. Otis Burress, MD 02/04/20 11:43 AM Medical Oncology and Hematology [facility] [place], [place] [address] Tel. [phone]    Fax. [phone]   I, Wilburn Mylar, am acting as scribe for Dr. Virgie Dad. Merita Hawks.  I, Lurline Del MD, have reviewed the above documentation for accuracy and completeness, and I agree with the above.  *Total Encounter Time as defined by the Centers for Medicare and Medicaid Services includes, in addition to the face-to-face time of a patient visit (documented in the note above) non-face-to-face time: obtaining and reviewing outside history, ordering and reviewing medications, tests or procedures, care coordination (communications with other health care professionals or caregivers) and documentation in the medical record.

## 2020-02-04 ENCOUNTER — Inpatient Hospital Stay: Payer: Medicare Other

## 2020-02-04 ENCOUNTER — Inpatient Hospital Stay: Payer: Medicare Other | Attending: Oncology | Admitting: Oncology

## 2020-02-04 ENCOUNTER — Other Ambulatory Visit: Payer: Self-pay

## 2020-02-04 VITALS — BP 153/72 | HR 72 | Temp 98.2°F | Resp 18 | Ht 61.0 in | Wt 153.9 lb

## 2020-02-04 DIAGNOSIS — D0512 Intraductal carcinoma in situ of left breast: Secondary | ICD-10-CM

## 2020-02-04 DIAGNOSIS — I10 Essential (primary) hypertension: Secondary | ICD-10-CM

## 2020-02-04 DIAGNOSIS — C50112 Malignant neoplasm of central portion of left female breast: Secondary | ICD-10-CM

## 2020-02-04 DIAGNOSIS — C50812 Malignant neoplasm of overlapping sites of left female breast: Secondary | ICD-10-CM

## 2020-02-04 DIAGNOSIS — D508 Other iron deficiency anemias: Secondary | ICD-10-CM

## 2020-02-04 DIAGNOSIS — C50412 Malignant neoplasm of upper-outer quadrant of left female breast: Secondary | ICD-10-CM | POA: Insufficient documentation

## 2020-02-04 DIAGNOSIS — F0150 Vascular dementia without behavioral disturbance: Secondary | ICD-10-CM

## 2020-02-04 DIAGNOSIS — Z170 Estrogen receptor positive status [ER+]: Secondary | ICD-10-CM

## 2020-02-04 DIAGNOSIS — Z79811 Long term (current) use of aromatase inhibitors: Secondary | ICD-10-CM | POA: Insufficient documentation

## 2020-02-04 DIAGNOSIS — M810 Age-related osteoporosis without current pathological fracture: Secondary | ICD-10-CM | POA: Diagnosis not present

## 2020-02-04 DIAGNOSIS — E039 Hypothyroidism, unspecified: Secondary | ICD-10-CM | POA: Insufficient documentation

## 2020-02-04 DIAGNOSIS — M175 Other unilateral secondary osteoarthritis of knee: Secondary | ICD-10-CM | POA: Diagnosis not present

## 2020-02-04 DIAGNOSIS — D509 Iron deficiency anemia, unspecified: Secondary | ICD-10-CM | POA: Insufficient documentation

## 2020-02-04 LAB — CBC WITH DIFFERENTIAL/PLATELET
Abs Immature Granulocytes: 0.02 10*3/uL (ref 0.00–0.07)
Basophils Absolute: 0 10*3/uL (ref 0.0–0.1)
Basophils Relative: 1 %
Eosinophils Absolute: 0.1 10*3/uL (ref 0.0–0.5)
Eosinophils Relative: 1 %
HCT: 35.7 % — ABNORMAL LOW (ref 36.0–46.0)
Hemoglobin: 11 g/dL — ABNORMAL LOW (ref 12.0–15.0)
Immature Granulocytes: 1 %
Lymphocytes Relative: 23 %
Lymphs Abs: 1 10*3/uL (ref 0.7–4.0)
MCH: 28.9 pg (ref 26.0–34.0)
MCHC: 30.8 g/dL (ref 30.0–36.0)
MCV: 93.9 fL (ref 80.0–100.0)
Monocytes Absolute: 0.3 10*3/uL (ref 0.1–1.0)
Monocytes Relative: 7 %
Neutro Abs: 2.9 10*3/uL (ref 1.7–7.7)
Neutrophils Relative %: 67 %
Platelets: 206 10*3/uL (ref 150–400)
RBC: 3.8 MIL/uL — ABNORMAL LOW (ref 3.87–5.11)
RDW: 16 % — ABNORMAL HIGH (ref 11.5–15.5)
WBC: 4.2 10*3/uL (ref 4.0–10.5)
nRBC: 0 % (ref 0.0–0.2)

## 2020-02-04 LAB — CMP (CANCER CENTER ONLY)
ALT: 27 U/L (ref 0–44)
AST: 29 U/L (ref 15–41)
Albumin: 3.9 g/dL (ref 3.5–5.0)
Alkaline Phosphatase: 61 U/L (ref 38–126)
Anion gap: 8 (ref 5–15)
BUN: 21 mg/dL (ref 8–23)
CO2: 27 mmol/L (ref 22–32)
Calcium: 9.8 mg/dL (ref 8.9–10.3)
Chloride: 105 mmol/L (ref 98–111)
Creatinine: 1.13 mg/dL — ABNORMAL HIGH (ref 0.44–1.00)
GFR, Est AFR Am: 49 mL/min — ABNORMAL LOW (ref >60)
GFR, Estimated: 42 mL/min — ABNORMAL LOW (ref >60)
Glucose, Bld: 99 mg/dL (ref 70–99)
Potassium: 3.9 mmol/L (ref 3.5–5.1)
Sodium: 140 mmol/L (ref 135–145)
Total Bilirubin: 0.6 mg/dL (ref 0.3–1.2)
Total Protein: 7.4 g/dL (ref 6.5–8.1)

## 2020-02-04 LAB — RETICULOCYTES
Immature Retic Fract: 21.7 % — ABNORMAL HIGH (ref 2.3–15.9)
RBC.: 3.81 MIL/uL — ABNORMAL LOW (ref 3.87–5.11)
Retic Count, Absolute: 89.5 10*3/uL (ref 19.0–186.0)
Retic Ct Pct: 2.4 % (ref 0.4–3.1)

## 2020-02-04 LAB — IRON AND TIBC
Iron: 55 ug/dL (ref 41–142)
Saturation Ratios: 18 % — ABNORMAL LOW (ref 21–57)
TIBC: 301 ug/dL (ref 236–444)
UIBC: 246 ug/dL (ref 120–384)

## 2020-02-04 LAB — FERRITIN: Ferritin: 40 ng/mL (ref 11–307)

## 2020-02-05 ENCOUNTER — Telehealth: Payer: Self-pay | Admitting: Oncology

## 2020-02-05 NOTE — Telephone Encounter (Signed)
I could not reach patient regarding schedule will mail 

## 2020-02-11 ENCOUNTER — Ambulatory Visit: Payer: Self-pay | Admitting: General Surgery

## 2020-02-15 ENCOUNTER — Encounter (HOSPITAL_BASED_OUTPATIENT_CLINIC_OR_DEPARTMENT_OTHER): Payer: Self-pay | Admitting: General Surgery

## 2020-02-15 ENCOUNTER — Other Ambulatory Visit: Payer: Self-pay

## 2020-02-16 ENCOUNTER — Other Ambulatory Visit (HOSPITAL_COMMUNITY)
Admission: RE | Admit: 2020-02-16 | Discharge: 2020-02-16 | Disposition: A | Discharge status: Home / Self Care | Payer: Medicare Other | Source: Ambulatory Visit | Attending: General Surgery | Admitting: General Surgery

## 2020-02-16 DIAGNOSIS — Z01812 Encounter for preprocedural laboratory examination: Secondary | ICD-10-CM | POA: Insufficient documentation

## 2020-02-16 DIAGNOSIS — Z20822 Contact with and (suspected) exposure to covid-19: Secondary | ICD-10-CM | POA: Diagnosis not present

## 2020-02-16 LAB — SARS CORONAVIRUS 2 (TAT 6-24 HRS): SARS Coronavirus 2: NEGATIVE

## 2020-02-19 ENCOUNTER — Encounter (HOSPITAL_BASED_OUTPATIENT_CLINIC_OR_DEPARTMENT_OTHER)
Admission: RE | Disposition: A | Discharge status: Home / Self Care | Payer: Self-pay | Source: Home / Self Care | Attending: General Surgery

## 2020-02-19 ENCOUNTER — Other Ambulatory Visit: Payer: Self-pay

## 2020-02-19 ENCOUNTER — Encounter (HOSPITAL_BASED_OUTPATIENT_CLINIC_OR_DEPARTMENT_OTHER): Payer: Self-pay | Admitting: General Surgery

## 2020-02-19 ENCOUNTER — Ambulatory Visit (HOSPITAL_BASED_OUTPATIENT_CLINIC_OR_DEPARTMENT_OTHER): Payer: Medicare Other | Admitting: Anesthesiology

## 2020-02-19 ENCOUNTER — Ambulatory Visit (HOSPITAL_BASED_OUTPATIENT_CLINIC_OR_DEPARTMENT_OTHER)
Admission: RE | Admit: 2020-02-19 | Discharge: 2020-02-19 | Disposition: A | Discharge status: Home / Self Care | Payer: Medicare Other | Attending: General Surgery | Admitting: General Surgery

## 2020-02-19 DIAGNOSIS — Z170 Estrogen receptor positive status [ER+]: Secondary | ICD-10-CM | POA: Diagnosis not present

## 2020-02-19 DIAGNOSIS — Z7982 Long term (current) use of aspirin: Secondary | ICD-10-CM | POA: Diagnosis not present

## 2020-02-19 DIAGNOSIS — R222 Localized swelling, mass and lump, trunk: Secondary | ICD-10-CM | POA: Diagnosis not present

## 2020-02-19 DIAGNOSIS — F329 Major depressive disorder, single episode, unspecified: Secondary | ICD-10-CM | POA: Insufficient documentation

## 2020-02-19 DIAGNOSIS — F0390 Unspecified dementia without behavioral disturbance: Secondary | ICD-10-CM | POA: Insufficient documentation

## 2020-02-19 DIAGNOSIS — Z79899 Other long term (current) drug therapy: Secondary | ICD-10-CM | POA: Diagnosis not present

## 2020-02-19 DIAGNOSIS — I10 Essential (primary) hypertension: Secondary | ICD-10-CM | POA: Diagnosis not present

## 2020-02-19 DIAGNOSIS — C50912 Malignant neoplasm of unspecified site of left female breast: Secondary | ICD-10-CM | POA: Insufficient documentation

## 2020-02-19 DIAGNOSIS — C792 Secondary malignant neoplasm of skin: Secondary | ICD-10-CM | POA: Insufficient documentation

## 2020-02-19 DIAGNOSIS — Z79811 Long term (current) use of aromatase inhibitors: Secondary | ICD-10-CM | POA: Insufficient documentation

## 2020-02-19 DIAGNOSIS — Z8673 Personal history of transient ischemic attack (TIA), and cerebral infarction without residual deficits: Secondary | ICD-10-CM | POA: Diagnosis not present

## 2020-02-19 DIAGNOSIS — C801 Malignant (primary) neoplasm, unspecified: Secondary | ICD-10-CM | POA: Diagnosis not present

## 2020-02-19 DIAGNOSIS — Z9012 Acquired absence of left breast and nipple: Secondary | ICD-10-CM | POA: Insufficient documentation

## 2020-02-19 DIAGNOSIS — E039 Hypothyroidism, unspecified: Secondary | ICD-10-CM | POA: Diagnosis not present

## 2020-02-19 DIAGNOSIS — C44591 Other specified malignant neoplasm of skin of breast: Secondary | ICD-10-CM | POA: Diagnosis not present

## 2020-02-19 DIAGNOSIS — Z853 Personal history of malignant neoplasm of breast: Secondary | ICD-10-CM | POA: Diagnosis not present

## 2020-02-19 HISTORY — PX: EXCISION OF KELOID: SHX6267

## 2020-02-19 SURGERY — EXCISION, KELOID
Anesthesia: Monitor Anesthesia Care | Site: Chest | Laterality: Left

## 2020-02-19 MED ORDER — HYDROMORPHONE HCL 1 MG/ML IJ SOLN: 0.2500 mg | INTRAMUSCULAR | Status: DC | PRN

## 2020-02-19 MED ORDER — LIDOCAINE-EPINEPHRINE 1 %-1:100000 IJ SOLN
INTRAMUSCULAR | Status: DC | PRN
  Administered 2020-02-19: 15 mL

## 2020-02-19 MED ORDER — CHLORHEXIDINE GLUCONATE CLOTH 2 % EX PADS: 6.0000 | MEDICATED_PAD | Freq: Once | CUTANEOUS | Status: DC

## 2020-02-19 MED ORDER — PROPOFOL 500 MG/50ML IV EMUL
INTRAVENOUS | Status: DC | PRN
  Administered 2020-02-19: 50 ug/kg/min via INTRAVENOUS

## 2020-02-19 MED ORDER — LIDOCAINE 2% (20 MG/ML) 5 ML SYRINGE
INTRAMUSCULAR | Status: AC
  Filled 2020-02-19: qty 5

## 2020-02-19 MED ORDER — LACTATED RINGERS IV SOLN: INTRAVENOUS | Status: DC

## 2020-02-19 MED ORDER — LIDOCAINE HCL (PF) 1 % IJ SOLN
INTRAMUSCULAR | Status: AC
  Filled 2020-02-19: qty 30

## 2020-02-19 MED ORDER — PROPOFOL 500 MG/50ML IV EMUL
INTRAVENOUS | Status: AC
  Filled 2020-02-19: qty 50

## 2020-02-19 MED ORDER — MEPERIDINE HCL 25 MG/ML IJ SOLN: 6.2500 mg | INTRAMUSCULAR | Status: DC | PRN

## 2020-02-19 MED ORDER — CEFAZOLIN SODIUM-DEXTROSE 2-4 GM/100ML-% IV SOLN
2.0000 g | INTRAVENOUS | Status: AC
  Administered 2020-02-19: 2 g via INTRAVENOUS

## 2020-02-19 MED ORDER — ONDANSETRON HCL 4 MG/2ML IJ SOLN
INTRAMUSCULAR | Status: AC
  Filled 2020-02-19: qty 2

## 2020-02-19 MED ORDER — FENTANYL CITRATE (PF) 100 MCG/2ML IJ SOLN: 50.0000 ug | INTRAMUSCULAR | Status: DC | PRN

## 2020-02-19 MED ORDER — HYDROCODONE-ACETAMINOPHEN 5-325 MG PO TABS: 1.0000 | ORAL_TABLET | Freq: Four times a day (QID) | ORAL | 0 refills | Status: DC | PRN

## 2020-02-19 MED ORDER — CEFAZOLIN SODIUM-DEXTROSE 2-4 GM/100ML-% IV SOLN
INTRAVENOUS | Status: AC
  Filled 2020-02-19: qty 100

## 2020-02-19 MED ORDER — ONDANSETRON HCL 4 MG/2ML IJ SOLN
INTRAMUSCULAR | Status: DC | PRN
  Administered 2020-02-19: 4 mg via INTRAVENOUS

## 2020-02-19 MED ORDER — LIDOCAINE-EPINEPHRINE 1 %-1:100000 IJ SOLN
INTRAMUSCULAR | Status: AC
  Filled 2020-02-19: qty 1

## 2020-02-19 MED ORDER — MIDAZOLAM HCL 2 MG/2ML IJ SOLN: 1.0000 mg | INTRAMUSCULAR | Status: DC | PRN

## 2020-02-19 MED ORDER — ONDANSETRON HCL 4 MG/2ML IJ SOLN: 4.0000 mg | Freq: Once | INTRAMUSCULAR | Status: DC | PRN

## 2020-02-19 MED ORDER — PROPOFOL 10 MG/ML IV BOLUS
INTRAVENOUS | Status: AC
  Filled 2020-02-19: qty 20

## 2020-02-19 SURGICAL SUPPLY — 48 items
ADH SKN CLS APL DERMABOND .7 (GAUZE/BANDAGES/DRESSINGS) ×1
APL PRP STRL LF DISP 70% ISPRP (MISCELLANEOUS) ×1
BLADE SURG 10 STRL SS (BLADE) ×3 IMPLANT
BLADE SURG 15 STRL LF DISP TIS (BLADE) ×1 IMPLANT
BLADE SURG 15 STRL SS (BLADE) ×3
CANISTER SUCT 1200ML W/VALVE (MISCELLANEOUS) IMPLANT
CHLORAPREP W/TINT 26 (MISCELLANEOUS) ×3 IMPLANT
COVER BACK TABLE 60X90IN (DRAPES) ×3 IMPLANT
COVER MAYO STAND STRL (DRAPES) ×3 IMPLANT
COVER WAND RF STERILE (DRAPES) IMPLANT
DECANTER SPIKE VIAL GLASS SM (MISCELLANEOUS) IMPLANT
DERMABOND ADVANCED (GAUZE/BANDAGES/DRESSINGS) ×2
DERMABOND ADVANCED .7 DNX12 (GAUZE/BANDAGES/DRESSINGS) ×1 IMPLANT
DRAPE LAPAROTOMY 100X72 PEDS (DRAPES) ×3 IMPLANT
DRAPE UTILITY XL STRL (DRAPES) ×3 IMPLANT
ELECT COATED BLADE 2.86 ST (ELECTRODE) ×3 IMPLANT
ELECT REM PT RETURN 9FT ADLT (ELECTROSURGICAL) ×3
ELECTRODE REM PT RTRN 9FT ADLT (ELECTROSURGICAL) ×1 IMPLANT
GAUZE 4X4 16PLY RFD (DISPOSABLE) IMPLANT
GLOVE BIO SURGEON STRL SZ7.5 (GLOVE) ×3 IMPLANT
GOWN STRL REUS W/ TWL LRG LVL3 (GOWN DISPOSABLE) ×2 IMPLANT
GOWN STRL REUS W/TWL LRG LVL3 (GOWN DISPOSABLE) ×6
NDL HYPO 25X1 1.5 SAFETY (NEEDLE) IMPLANT
NEEDLE HYPO 25X1 1.5 SAFETY (NEEDLE) IMPLANT
NS IRRIG 1000ML POUR BTL (IV SOLUTION) ×3 IMPLANT
PACK BASIN DAY SURGERY FS (CUSTOM PROCEDURE TRAY) ×3 IMPLANT
PENCIL SMOKE EVACUATOR (MISCELLANEOUS) ×3 IMPLANT
SLEEVE SCD COMPRESS KNEE MED (MISCELLANEOUS) ×3 IMPLANT
SPONGE LAP 18X18 RF (DISPOSABLE) ×3 IMPLANT
SUT CHROMIC 3 0 SH 27 (SUTURE) IMPLANT
SUT ETHILON 3 0 PS 1 (SUTURE) IMPLANT
SUT MON AB 4-0 PC3 18 (SUTURE) ×2 IMPLANT
SUT PROLENE 3 0 PS 2 (SUTURE) IMPLANT
SUT SILK 2 0 PERMA HAND 18 BK (SUTURE) IMPLANT
SUT VIC AB 3-0 54X BRD REEL (SUTURE) IMPLANT
SUT VIC AB 3-0 BRD 54 (SUTURE)
SUT VIC AB 3-0 FS2 27 (SUTURE) IMPLANT
SUT VIC AB 3-0 SH 27 (SUTURE)
SUT VIC AB 3-0 SH 27X BRD (SUTURE) IMPLANT
SUT VIC AB 4-0 RB1 27 (SUTURE)
SUT VIC AB 4-0 RB1 27X BRD (SUTURE) IMPLANT
SUT VICRYL 4-0 PS2 18IN ABS (SUTURE) IMPLANT
SUT VICRYL AB 3 0 TIES (SUTURE) IMPLANT
SYR CONTROL 10ML LL (SYRINGE) IMPLANT
TOWEL GREEN STERILE FF (TOWEL DISPOSABLE) ×6 IMPLANT
TUBE CONNECTING 20'X1/4 (TUBING)
TUBE CONNECTING 20X1/4 (TUBING) IMPLANT
YANKAUER SUCT BULB TIP NO VENT (SUCTIONS) IMPLANT

## 2020-02-19 NOTE — Anesthesia Postprocedure Evaluation (Signed)
Anesthesia Post Note  Patient: Shelia Marks  Procedure(s) Performed: EXCISION NODULE LEFT CHEST WALL (Left Chest)     Patient location during evaluation: PACU Anesthesia Type: MAC Level of consciousness: awake and alert Pain management: pain level controlled Vital Signs Assessment: post-procedure vital signs reviewed and stable Respiratory status: spontaneous breathing, nonlabored ventilation, respiratory function stable and patient connected to nasal cannula oxygen Cardiovascular status: stable and blood pressure returned to baseline Postop Assessment: no apparent nausea or vomiting Anesthetic complications: no    Last Vitals:  Vitals:   02/19/20 1504 02/19/20 1723  BP: (!) 185/80 139/72  Pulse: 74 63  Resp: 18 18  Temp: 36.6 C 36.7 C  SpO2: 100% 98%    Last Pain:  Vitals:   02/19/20 1723  TempSrc:   PainSc: 0-No pain                 Eleora Sutherland DAVID

## 2020-02-19 NOTE — Anesthesia Preprocedure Evaluation (Signed)
Anesthesia Evaluation  Patient identified by MRN, date of birth, ID band Patient awake    Reviewed: Allergy & Precautions, NPO status , Patient's Chart, lab work & pertinent test results  Airway Mallampati: I  TM Distance: >3 FB Neck ROM: Full    Dental   Pulmonary    Pulmonary exam normal        Cardiovascular hypertension, Pt. on medications Normal cardiovascular exam     Neuro/Psych Depression Dementia CVA    GI/Hepatic   Endo/Other    Renal/GU      Musculoskeletal   Abdominal   Peds  Hematology   Anesthesia Other Findings   Reproductive/Obstetrics                             Anesthesia Physical Anesthesia Plan  ASA: III  Anesthesia Plan: MAC   Post-op Pain Management:    Induction: Intravenous  PONV Risk Score and Plan: 2 and Treatment may vary due to age or medical condition  Airway Management Planned: Nasal Cannula  Additional Equipment:   Intra-op Plan:   Post-operative Plan:   Informed Consent: I have reviewed the patients History and Physical, chart, labs and discussed the procedure including the risks, benefits and alternatives for the proposed anesthesia with the patient or authorized representative who has indicated his/her understanding and acceptance.       Plan Discussed with: CRNA and Surgeon  Anesthesia Plan Comments:         Anesthesia Quick Evaluation

## 2020-02-19 NOTE — Discharge Instructions (Signed)

## 2020-02-19 NOTE — Op Note (Signed)
02/19/2020  5:15 PM  PATIENT:  Shelia Marks  86 y.o. female  PRE-OPERATIVE DIAGNOSIS:  NODULE LEFT CHEST WALL, history of breast cancer  POST-OPERATIVE DIAGNOSIS: nodule left chest wall, history of breast cancer  PROCEDURE:  Procedure(s): EXCISION NODULE LEFT CHEST WALL (Left)  SURGEON:  Surgeon(s) and Role:    Jovita Kussmaul, MD - Primary  PHYSICIAN ASSISTANT:   ASSISTANTS: none   ANESTHESIA:   local and IV sedation  EBL:  minimal   BLOOD ADMINISTERED:none  DRAINS: none   LOCAL MEDICATIONS USED:  LIDOCAINE   SPECIMEN:  Source of Specimen:  nodule left chest wall  DISPOSITION OF SPECIMEN:  PATHOLOGY  COUNTS:  YES  TOURNIQUET:  * No tourniquets in log *  DICTATION: .Dragon Dictation   After informed consent was obtained the patient was brought to the operating room and placed in the supine position on the operating table.  After adequate IV sedation had been given the patient's left chest wall was prepped with ChloraPrep, allowed to dry, and draped in usual sterile manner.  An appropriate timeout was performed.  The area around the palpable nodule on the left chest wall was infiltrated with 1% lidocaine with epinephrine until a good field block was created.  The patient has a history of left breast cancer and previously had a mastectomy.  An elliptical incision was made around the palpable mass with a 15 blade knife.  The incision was carried through the skin and subcutaneous tissue sharply with a 15 blade knife until the entire mass was removed.  The mass was marked with a short stitch on the superior surface and long stitch on the lateral surface.  The specimen was then sent to pathology for further evaluation.  Hemostasis was achieved using the Bovie electrocautery.  The incision was then closed with interrupted 4-0 Monocryl subcuticular stitches.  Dermabond dressings were applied.  The patient tolerated the procedure well.  At the end of the case all needle sponge and  instrument counts were correct.  The patient was then awakened and taken to recovery in stable condition.  The final excision was approximately 4 x 2 cm  PLAN OF CARE: Discharge to home after PACU  PATIENT DISPOSITION:  PACU - hemodynamically stable.   Delay start of Pharmacological VTE agent (>24hrs) due to surgical blood loss or risk of bleeding: not applicable

## 2020-02-19 NOTE — Transfer of Care (Signed)
**Note Shelia-Identified via Obfuscation** Immediate Anesthesia Transfer of Care Note  Patient: Shelia Marks  Procedure(s) Performed: EXCISION NODULE LEFT CHEST WALL (Left Chest)  Patient Location: PACU  Anesthesia Type:MAC  Level of Consciousness: awake, alert  and oriented  Airway & Oxygen Therapy: Patient Spontanous Breathing and Patient connected to face mask oxygen  Post-op Assessment: Report given to RN and Post -op Vital signs reviewed and stable  Post vital signs: Reviewed and stable  Last Vitals:  Vitals Value Taken Time  BP    Temp    Pulse 63 02/19/20 1723  Resp    SpO2 98 % 02/19/20 1723  Vitals shown include unvalidated device data.  Last Pain:  Vitals:   02/19/20 1504  TempSrc: Oral  PainSc: 0-No pain      Patients Stated Pain Goal: 3 ([id number])  Complications: No apparent anesthesia complications

## 2020-02-19 NOTE — Interval H&P Note (Signed)
History and Physical Interval Note:  02/19/2020 4:30 PM  Shelia Marks  has presented today for surgery, with the diagnosis of NODULE LEFT CHEST WALL.  The various methods of treatment have been discussed with the patient and family. After consideration of risks, benefits and other options for treatment, the patient has consented to  Procedure(s): EXCISION NODULE LEFT CHEST WALL (Left) as a surgical intervention.  The patient's history has been reviewed, patient examined, no change in status, stable for surgery.  I have reviewed the patient's chart and labs.  Questions were answered to the patient's satisfaction.     Autumn Messing III

## 2020-02-19 NOTE — H&P (Signed)
Shelia Marks  Location: [facility] Patient #: [id number] DOB: 05/23/1926 Widowed / Language: English / Race: Black or African American Female   History of Present Illness The patient is a 86 year old female who presents for a follow-up for Breast cancer. The patient is a 86-year-old white female who is about 5 months status post left modified radical mastectomy for a T2 N2 left breast cancer that was ER positive and PR negative and HER-2 negative with a Ki-67 of 90%. She tolerated the surgery well. She has no complaints today other than feeling like she can't walk very well. She denies any chest wall pain. She is taking Aromasin and seems to be tolerating that relatively well. She does have some underlying dementia but is very pleasant.   Allergies  No Known Drug Allergies   Medication History Alendronate Sodium (70MG Tablet, Oral) Active. Allopurinol (300MG Tablet, Oral) Active. AmLODIPine Besylate (10MG Tablet, Oral) Active. Lisinopril (2.5MG Tablet, Oral) Active. Aspirin (81MG Tablet, Oral) Active. Atenolol (25MG Tablet, Oral) Active. Anastrozole (1MG Tablet, Oral) Active. Atorvastatin Calcium (20MG Tablet, Oral) Active. Exemestane (25MG Tablet, Oral) Active. Medications Reconciled    Review of Systems  General Not Present- Appetite Loss, Chills, Fatigue, Fever, Night Sweats, Weight Gain and Weight Loss. Skin Not Present- Change in Wart/Mole, Dryness, Hives, Jaundice, New Lesions, Non-Healing Wounds, Rash and Ulcer. HEENT Not Present- Earache, Hearing Loss, Hoarseness, Nose Bleed, Oral Ulcers, Ringing in the Ears, Seasonal Allergies, Sinus Pain, Sore Throat, Visual Disturbances, Wears glasses/contact lenses and Yellow Eyes. Respiratory Not Present- Bloody sputum, Chronic Cough, Difficulty Breathing, Snoring and Wheezing. Cardiovascular Present- Leg Cramps. Not Present- Chest Pain, Difficulty Breathing Lying Down, Palpitations, Rapid Heart Rate,  Shortness of Breath and Swelling of Extremities. Gastrointestinal Present- Bloody Stool, Constipation and Hemorrhoids. Not Present- Abdominal Pain, Bloating, Change in Bowel Habits, Chronic diarrhea, Difficulty Swallowing, Excessive gas, Gets full quickly at meals, Indigestion, Nausea, Rectal Pain and Vomiting. Neurological Present- Trouble walking. Not Present- Decreased Memory, Fainting, Headaches, Numbness, Seizures, Tingling, Tremor and Weakness. Psychiatric Present- Depression. Not Present- Anxiety, Bipolar, Change in Sleep Pattern, Fearful and Frequent crying.  Vitals  Weight: 145 lb Height: 61in Body Surface Area: 1.65 m Body Mass Index: 27.4 kg/m  Temp.: 97.6F  Pulse: 74 (Regular)  BP: 138/72 (Sitting, Left Arm, Standard)       Physical Exam  General Mental Status-Alert. General Appearance-Consistent with stated age. Hydration-Well hydrated. Voice-Normal.  Head and Neck Head-normocephalic, atraumatic with no lesions or palpable masses. Trachea-midline. Thyroid Gland Characteristics - normal size and consistency.  Eye Eyeball - Bilateral-Extraocular movements intact. Sclera/Conjunctiva - Bilateral-No scleral icterus.  Chest and Lung Exam Chest and lung exam reveals -quiet, even and easy respiratory effort with no use of accessory muscles and on auscultation, normal breath sounds, no adventitious sounds and normal vocal resonance. Inspection Chest Wall - Normal. Back - normal.  Breast Note: The left mastectomy incision has healed well with no sign of infection or seroma. There is some generalized thickening of the skin of the left chest wall diffusely. In the mid upper skin flap there is a 1 cm mobile nodule in the skin. There is no palpable axillary, supraclavicular, or cervical lymphadenopathy.   Cardiovascular Cardiovascular examination reveals -normal heart sounds, regular rate and rhythm with no murmurs and normal pedal pulses  bilaterally.  Abdomen Inspection Inspection of the abdomen reveals - No Hernias. Skin - Scar - no surgical scars. Palpation/Percussion Palpation and Percussion of the abdomen reveal - Soft, Non Tender, No Rebound tenderness,   No Rigidity (guarding) and No hepatosplenomegaly. Auscultation Auscultation of the abdomen reveals - Bowel sounds normal.  Neurologic Neurologic evaluation reveals -alert and oriented x 3 with no impairment of recent or remote memory. Mental Status-Normal.  Musculoskeletal Normal Exam - Left-Upper Extremity Strength Normal and Lower Extremity Strength Normal. Normal Exam - Right-Upper Extremity Strength Normal and Lower Extremity Strength Normal.  Lymphatic Head & Neck  General Head & Neck Lymphatics: Bilateral - Description - Normal. Axillary  General Axillary Region: Bilateral - Description - Normal. Tenderness - Non Tender. Femoral & Inguinal  Generalized Femoral & Inguinal Lymphatics: Bilateral - Description - Normal. Tenderness - Non Tender.    Assessment & Plan  CANCER OF LEFT FEMALE BREAST (C50.912) Impression: The patient is about 5 months status post left modified radical mastectomy for breast cancer. She now has a new 1 cm nodule in the skin of the mid upper skin flap of the left chest wall. Unfortunately I do not know if this is scar tissue related to the surgery or could it be a recurrence of the cancer. The only way to know would be to remove it. I have discussed this with her and her family and she does not want any more surgery. At this point we will keep her on the anti-estrogen pill Aromasin. I will plan to see her back in about 3 months to check her progress. This patient encounter took 20 minutes today to perform the following: take history, perform exam, review outside records, interpret imaging, counsel the patient on their diagnosis and document encounter, findings & plan in the EHR Current Plans Follow up with [place] in the office in  3 MONTHS.  Call us sooner as needed.   She had now decided to have the area removed. Risks and benefits of the surgery were discussed with the pt and she understands and wishes to proceed

## 2020-02-22 ENCOUNTER — Encounter: Payer: Self-pay | Admitting: *Deleted

## 2020-02-22 ENCOUNTER — Other Ambulatory Visit: Payer: Self-pay

## 2020-02-24 ENCOUNTER — Other Ambulatory Visit: Payer: Self-pay | Admitting: Internal Medicine

## 2020-02-24 LAB — SURGICAL PATHOLOGY

## 2020-03-06 NOTE — Progress Notes (Signed)
[facility]  Telephone:([phone]) [phone] Fax:(336) [phone]    ID: Nare Gaspari DOB: 1926-10-02  MR#: [id number]  JSE#:[id number]  Patient Care Team: Gayland Curry, DO as PCP - General (Geriatric Medicine) Rutherford Guys, MD as Consulting Physician (Ophthalmology) Evgenia Merriman, Virgie Dad, MD as Consulting Physician (Oncology) Jovita Kussmaul, MD as Consulting Physician (General Surgery) Jacelyn Pi, MD as Consulting Physician (Endocrinology) OTHER MD:   CHIEF COMPLAINT: Estrogen receptor positive breast cancer (s/p left mastectomy)  CURRENT TREATMENT: exemestane   INTERVAL HISTORY: Willo returns today for follow-up of her estrogen receptor positive breast cancer. She is accompanied by her sister, [name].  She was switched to exemestane in 12/2019.  She is tolerating this with no side effects that she is aware of.  In particular she is not having any hot flashes, which is the most common problem.  She was found to have a chest wall recurrence by Dr. Marlou Starks and agreed to proceed with excision of the left chest wall nodule on 02/19/2020. Pathology from the procedure ([id number]) confirmed metastatic carcinoma, margins uninvolved. Prognostic indicators significant for: estrogen receptor 95% positive with strong staining intensity and progesterone receptor 0% negative. Proliferation marker Ki-67 of 50%. Her2 negative by immunohistochemistry (1+)   REVIEW OF SYSTEMS: Jaziya did well with her surgery although she does not remember it very clearly.  She is tolerating exemestane with no obvious side effects that she or her sister can tell me about and particularly hot flashes are not a major issue.  At home the patient uses a walker to get around and is quite functional for her age.  There have been no intercurrent falls.   BREAST CANCER HISTORY: From the original intake note:  "Lutie" had bilateral screening mammography at the [facility] 06/07/2016 showing  calcifications in the left breast. She was recalled  05/15/2016 for left diagnostic mammography. This showed the breast density to be category be. In the upper outer left breast there was an area of suspicious calcifications measuring 1.7 cm.  Biopsy of this area was obtained 05/21/2016, and showed ([id number]) ductal carcinoma in situ, high-grade, estrogen receptor 100% positive, progesterone receptor 5% positive, WITH strong staining intensity.  Her subsequent history is as detailed below.   PAST MEDICAL HISTORY: Past Medical History:  Diagnosis Date  . Alzheimer's disease ([place])   . Cancer ([place])   . Disorder of bone and cartilage, unspecified   . Dizziness and giddiness   . Hyperlipidemia LDL goal < 100   . Hypertension   . Hypothyroidism   . Malignant neoplasm of thyroid gland ([place])   . Nontoxic uninodular goiter   . Other malaise and fatigue   . Other symptoms involving cardiovascular system   . Phlebitis and thrombophlebitis of unspecified site   . Senile osteoporosis   . Sleep related leg cramps   . Stroke ([place])   . Unspecified disorder of lipoid metabolism   . Unspecified hereditary and idiopathic peripheral neuropathy   . Varicose veins of lower extremities with inflammation     PAST SURGICAL HISTORY: Past Surgical History:  Procedure Laterality Date  . ABDOMINAL HYSTERECTOMY     DR HUGES  . BREAST BIOPSY Left 05/21/2016   malignant  . CATRACT SURGERY  2008  . COLON SURGERY     clipped polyp during colonoscopy  . EXCISION OF KELOID Left 02/19/2020   Procedure: EXCISION NODULE LEFT CHEST WALL;  Surgeon: Jovita Kussmaul, MD;  Location: [facility];  Service: General;  Laterality: Left;  .  KNEE SURGERY     right  . MASTECTOMY MODIFIED RADICAL Left 09/10/2019   Procedure: LEFT MASTECTOMY MODIFIED RADICAL;  Surgeon: Jovita Kussmaul, MD;  Location: [facility];  Service: General;  Laterality: Left;  . PARATHYROID ADENOMA REMOVED  1983  . THYROID SURGERY  July 20, 2011    FAMILY HISTORY Family History  Problem Relation Age of Onset  . Diabetes Brother    The patient's father died from liver problems in his 31s. The patient's mother died in her 53s from "natural causes". The patient had no brothers. She had 6 sisters, 2 of whom died from complications of diabetes and one from a ruptured aneurysm. There is no history of breast or ovarian cancer in the family.    GYNECOLOGIC HISTORY:  No LMP recorded. Patient has had a hysterectomy.  Gregoria does not remember how she was when she had her first period. She never carried a child to term. She is status post hysterectomy and at least one ovary was removed. She never took hormone replacement.    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  She is widowed and lives with her sister.  ADVANCED DIRECTIVES: [name], Bryon Lions, is her healthcare power of attorney. [name] can be reached at [phone].   HEALTH MAINTENANCE: Social History   Tobacco Use  . Smoking status: Never Smoker  . Smokeless tobacco: Never Used  Substance Use Topics  . Alcohol use: No  . Drug use: No     No Known Allergies  Current Outpatient Medications  Medication Sig Dispense Refill  . acetaminophen (TYLENOL) 325 MG tablet Take 2 tablets (650 mg total) by mouth every 6 (six) hours as needed for mild pain, moderate pain, fever or headache.    . alendronate (FOSAMAX) 70 MG tablet TAKE 1 TABLET (70 MG TOTAL) EVERY 7 (SEVEN) DAYS. TAKE WITH A FULL GLASS OF WATER ON AN EMPTY STOMACH. 12 tablet 1  . allopurinol (ZYLOPRIM) 300 MG tablet TAKE 1 TABLET EVERY DAY 90 tablet 1  . amLODipine (NORVASC) 10 MG tablet TAKE 1 TABLET EVERY DAY 90 tablet 1  . aspirin EC 81 MG tablet Take 1 tablet (81 mg total) by mouth daily. 30 tablet 3  . atenolol (TENORMIN) 25 MG tablet TAKE 1 TABLET EVERY DAY 90 tablet 1  . atorvastatin (LIPITOR) 40 MG tablet TAKE 1 TABLET EVERY DAY 90 tablet 0  .  calcium carbonate (TUMS - DOSED IN MG ELEMENTAL CALCIUM) 500 MG chewable tablet Chew 1 tablet by mouth as needed for indigestion or heartburn.    . Cholecalciferol (VITAMIN D) 50 MCG (2000 UT) CAPS Take 1 capsule by mouth 3 (three) times a week.    Marland Kitchen exemestane (AROMASIN) 25 MG tablet Take 1 tablet (25 mg total) by mouth daily after breakfast. 90 tablet 3  . HYDROcodone-acetaminophen (NORCO/VICODIN) 5-325 MG tablet Take 1-2 tablets by mouth every 6 (six) hours as needed for moderate pain or severe pain. 10 tablet 0  . hydrOXYzine (ATARAX/VISTARIL) 10 MG tablet Take 1 tablet (10 mg total) by mouth 3 (three) times daily as needed. 30 tablet 0  . iron polysaccharides (NIFEREX) 150 MG capsule Take 1 capsule (150 mg total) by mouth every other day. 15 capsule 3  . levothyroxine (SYNTHROID, LEVOTHROID) 112 MCG tablet Take 112 mcg by mouth daily before breakfast.     . lisinopril (ZESTRIL) 2.5 MG tablet TAKE 1 TABLET DAILY TO CONTROL BLOOD PRESSURE  90 tablet 1  . magnesium hydroxide (MILK OF MAGNESIA) 400 MG/5ML suspension Take 5-10 mLs by mouth daily as needed for mild constipation.    . sertraline (ZOLOFT) 50 MG tablet TAKE ONE TABLET DAILY FOR DEPRESSION 90 tablet 1   No current facility-administered medications for this visit.    OBJECTIVE: Older African-American woman examined in a wheelchair Vitals:   03/07/20 1152  BP: (!) 154/73  Pulse: 63  Resp: 18  Temp: 98.7 F (37.1 C)  SpO2: 100%     Body mass index is 29.32 kg/m.    ECOG FS:2 - Symptomatic, <50% confined to bed  Sclerae unicteric, EOMs intact Wearing a mask No cervical or supraclavicular adenopathy Lungs no rales or rhonchi Heart regular rate and rhythm Abd soft, nontender, positive bowel sounds MSK no focal spinal tenderness, no upper extremity lymphedema Neuro: nonfocal, appropriate affect Breasts: The right breast is benign.  The left breast has undergone mastectomy.  The recent reexcision has healed very nicely, with no  dehiscence, erythema, or swelling.  Both axillae are benign.   LAB RESULTS:  CMP     Component Value Date/Time   NA 140 02/04/2020 1019   NA 143 06/01/2016 0958   K 3.9 02/04/2020 1019   CL 105 02/04/2020 1019   CO2 27 02/04/2020 1019   GLUCOSE 99 02/04/2020 1019   BUN 21 02/04/2020 1019   BUN 21 06/01/2016 0958   CREATININE 1.13 (H) 02/04/2020 1019   CREATININE 0.90 (H) 01/18/2020 1125   CALCIUM 9.8 02/04/2020 1019   PROT 7.4 02/04/2020 1019   PROT 6.8 11/09/2015 1013   ALBUMIN 3.9 02/04/2020 1019   ALBUMIN 4.1 11/09/2015 1013   AST 29 02/04/2020 1019   ALT 27 02/04/2020 1019   ALKPHOS 61 02/04/2020 1019   BILITOT 0.6 02/04/2020 1019   GFRNONAA 42 (L) 02/04/2020 1019   GFRNONAA 51 (L) 02/25/2018 0937   GFRAA 49 (L) 02/04/2020 1019   GFRAA 59 (L) 02/25/2018 0937    INo results found for: SPEP, UPEP  Lab Results  Component Value Date   WBC 4.2 02/04/2020   NEUTROABS 2.9 02/04/2020   HGB 11.0 (L) 02/04/2020   HCT 35.7 (L) 02/04/2020   MCV 93.9 02/04/2020   PLT 206 02/04/2020      Chemistry      Component Value Date/Time   NA 140 02/04/2020 1019   NA 143 06/01/2016 0958   K 3.9 02/04/2020 1019   CL 105 02/04/2020 1019   CO2 27 02/04/2020 1019   BUN 21 02/04/2020 1019   BUN 21 06/01/2016 0958   CREATININE 1.13 (H) 02/04/2020 1019   CREATININE 0.90 (H) 01/18/2020 1125      Component Value Date/Time   CALCIUM 9.8 02/04/2020 1019   ALKPHOS 61 02/04/2020 1019   AST 29 02/04/2020 1019   ALT 27 02/04/2020 1019   BILITOT 0.6 02/04/2020 1019       No results found for: LABCA2  No components found for: LABCA125  No results for input(s): INR in the last 168 hours.  Urinalysis    Component Value Date/Time   COLORURINE YELLOW 09/12/2019 [place] 09/12/2019 1413   LABSPEC 1.012 09/12/2019 1413   PHURINE 6.0 09/12/2019 1413   GLUCOSEU NEGATIVE 09/12/2019 1413   HGBUR NEGATIVE 09/12/2019 [place] 09/12/2019 [place] 09/12/2019 1413   PROTEINUR NEGATIVE 09/12/2019 1413   UROBILINOGEN 0.2 03/05/2014 2055   NITRITE NEGATIVE 09/12/2019 1413  LEUKOCYTESUR NEGATIVE 09/12/2019 1413    STUDIES: No results found.   ELIGIBLE FOR AVAILABLE RESEARCH PROTOCOL: no   ASSESSMENT: 86 y.o. [place] woman status post left breast upper outer quadrant biopsy 05/21/2016 for ductal carcinoma in situ, high-grade, estrogen and progesterone receptor positive  (1) opted against surgery   (2) started anastrozole neoadjuvantly 06/13/2016, discontinued June 2018 due to osteoporosis concerns  (a)  bone density 11/16/2016 shows a T score of -4.7 (osteoporosis)-on fosamax.   (3) mammography/ultrasonography 06/10/2018 shows evidence of disease progression, possible lymph node extension  (4) letrozole started 06/27/2018   (a) discontinued November 2019 with evidence of progression  (b) restarted 09/2019, to change to Exemestane in 12/2019  (5) fulvestrant started 11/21/2018, discontinued 03/12/2019  (a) mammography and ultrasonography 07/24/2019 shows evidence of disease progression  (6) Left modified radical mastectomy on 09/10/2019: showing a stage IIIA, T2, N2a, invasive ductal carcinoma, grade 3, margins negative, estrogen receptor positive, progesterone receptor and HER-2 negative, with an MIB-1 of 90%  (a) 4 out of 14 lymph nodes positive for macrometastases  (b) 1 out of 14 lymph nodes positive for micrometastases  (c) CT chest on 08/26/2019 negative for metastatic disease  (7) Adjuvant radiation recommended on 09/24/2019 and again on10/09/2019, declined by patient  (8) removal of the left chest wall lesion 02/19/2020 showing metastatic breast cancer, estrogen receptor 95% and strongly positive, progesterone receptor negative, HER-2 negative with an MIB-1 of 50%  (a) patient and sister again opted against adjuvant radiation at 03/07/2020 visit  (9) exemestane started December 2020   PLAN: Ms.  Mckibbin is tolerating exemestane well.  Her tumor remains estrogen receptor positive.  I am hopeful that the exemestane will control the cancer so that she does not require further surgery.  She understands standard of care at this point is to proceed to adjuvant chest wall radiation.  She does not think she can lie flat and her sister does not think she would be able to get her dressed daily and brought here over several weeks so unfortunately we are not proceeding with adjuvant radiation.  I am going to see her again in 2 months just to make sure that everything is in place and if so we will start seeing her on an every 58-monthbasis thereafter.  Total encounter time 30 minutes.*Sarajane JewsC. Cameron Katayama, MD 03/07/20 6:06 PM Medical Oncology and H[facility] [id number]. [phone]   Fax. [phone]  I, KWilburn Mylar am acting as scribe for Dr. GVirgie Dad Katai Marsico.  I, GLurline DelMD, have reviewed the above documentation for accuracy and completeness, and I agree with the above.   *Total Encounter Time as defined by the Centers for Medicare and Medicaid Services includes, in addition to the face-to-face time of a patient visit (documented in the note above) non-face-to-face time: obtaining and reviewing outside history, ordering and reviewing medications, tests or procedures, care coordination (communications with other health care professionals or caregivers) and documentation in the medical record.

## 2020-03-07 ENCOUNTER — Other Ambulatory Visit: Payer: Self-pay

## 2020-03-07 ENCOUNTER — Inpatient Hospital Stay: Payer: Medicare Other | Attending: Oncology | Admitting: Oncology

## 2020-03-07 VITALS — BP 154/73 | HR 63 | Temp 98.7°F | Resp 18 | Ht 61.0 in | Wt 155.2 lb

## 2020-03-07 DIAGNOSIS — C50412 Malignant neoplasm of upper-outer quadrant of left female breast: Secondary | ICD-10-CM | POA: Insufficient documentation

## 2020-03-07 DIAGNOSIS — Z170 Estrogen receptor positive status [ER+]: Secondary | ICD-10-CM | POA: Diagnosis not present

## 2020-03-07 DIAGNOSIS — M810 Age-related osteoporosis without current pathological fracture: Secondary | ICD-10-CM

## 2020-03-07 DIAGNOSIS — C50812 Malignant neoplasm of overlapping sites of left female breast: Secondary | ICD-10-CM | POA: Diagnosis not present

## 2020-03-07 DIAGNOSIS — F0150 Vascular dementia without behavioral disturbance: Secondary | ICD-10-CM

## 2020-03-07 DIAGNOSIS — I6350 Cerebral infarction due to unspecified occlusion or stenosis of unspecified cerebral artery: Secondary | ICD-10-CM | POA: Diagnosis not present

## 2020-03-07 DIAGNOSIS — D0512 Intraductal carcinoma in situ of left breast: Secondary | ICD-10-CM

## 2020-03-07 DIAGNOSIS — Z79811 Long term (current) use of aromatase inhibitors: Secondary | ICD-10-CM | POA: Insufficient documentation

## 2020-03-08 ENCOUNTER — Telehealth: Payer: Self-pay | Admitting: Oncology

## 2020-03-08 NOTE — Telephone Encounter (Signed)
Scheduled appts per 3/8 los. Pt is aware of new appt date and time.

## 2020-03-24 MED FILL — EXEMESTANE 25 MG TABLET: 25 | 90 days supply | Qty: 90 | Fill #1

## 2020-04-05 ENCOUNTER — Telehealth: Payer: Self-pay | Admitting: *Deleted

## 2020-04-05 NOTE — Telephone Encounter (Signed)
Let's provide her with the contact information for [facility] home care.  Shelia Marks has it.  Bathing and dressing are services medicare generally does not pay for.  There is an out-of-pocket cost.

## 2020-04-05 NOTE — Telephone Encounter (Signed)
Sister calling asking for some help with giving patient a "good bath"  Sister also states she may need a hospital bed later on. She would like a nurse or someone to come help with patient at least one day a week. Please advise

## 2020-04-05 NOTE — Telephone Encounter (Signed)
Spoke with sister and she would like the [facility] information, which I mailed, sister is also stating that the dementia is worse in the morning.

## 2020-04-22 ENCOUNTER — Other Ambulatory Visit: Payer: Self-pay | Admitting: Internal Medicine

## 2020-04-22 ENCOUNTER — Other Ambulatory Visit: Payer: Self-pay | Admitting: Nurse Practitioner

## 2020-05-02 ENCOUNTER — Other Ambulatory Visit: Payer: Self-pay | Admitting: Internal Medicine

## 2020-05-03 NOTE — Telephone Encounter (Signed)
rx sent to pharmacy by e-script  

## 2020-05-04 ENCOUNTER — Other Ambulatory Visit: Payer: Self-pay | Admitting: Internal Medicine

## 2020-05-09 ENCOUNTER — Other Ambulatory Visit: Payer: Self-pay | Admitting: Internal Medicine

## 2020-05-12 ENCOUNTER — Ambulatory Visit (INDEPENDENT_AMBULATORY_CARE_PROVIDER_SITE_OTHER): Payer: Medicare Other | Admitting: Internal Medicine

## 2020-05-12 ENCOUNTER — Other Ambulatory Visit: Payer: Self-pay

## 2020-05-12 ENCOUNTER — Encounter: Payer: Self-pay | Admitting: Internal Medicine

## 2020-05-12 VITALS — BP 150/92 | HR 98 | Temp 97.5°F | Ht 61.0 in | Wt 155.0 lb

## 2020-05-12 DIAGNOSIS — I6350 Cerebral infarction due to unspecified occlusion or stenosis of unspecified cerebral artery: Secondary | ICD-10-CM | POA: Diagnosis not present

## 2020-05-12 DIAGNOSIS — Z170 Estrogen receptor positive status [ER+]: Secondary | ICD-10-CM

## 2020-05-12 DIAGNOSIS — R2689 Other abnormalities of gait and mobility: Secondary | ICD-10-CM

## 2020-05-12 DIAGNOSIS — C50812 Malignant neoplasm of overlapping sites of left female breast: Secondary | ICD-10-CM

## 2020-05-12 DIAGNOSIS — F0150 Vascular dementia without behavioral disturbance: Secondary | ICD-10-CM

## 2020-05-12 DIAGNOSIS — F320 Major depressive disorder, single episode, mild: Secondary | ICD-10-CM | POA: Diagnosis not present

## 2020-05-12 DIAGNOSIS — R310 Gross hematuria: Secondary | ICD-10-CM

## 2020-05-12 NOTE — Progress Notes (Signed)
Location:  [facility] clinic Provider:  Corion Sherrod L. Mariea Clonts, D.O., C.M.D.  Code Status: DNR Goals of Care:  Advanced Directives 05/12/2020  Does Patient Have a Medical Advance Directive? Yes  Type of Advance Directive Out of facility DNR (pink MOST or yellow form)  Does patient want to make changes to medical advance directive? No - Patient declined  Copy of [place] in Chart? -  Would patient like information on creating a medical advance directive? -  Pre-existing out of facility DNR order (yellow form or pink MOST form) Pink MOST/Yellow Form most recent copy in chart - Physician notified to receive inpatient order     Chief Complaint  Patient presents with  . Medical Management of Chronic Issues    4 month follow     HPI: Patient is a 86 y.o. female seen today for medical management of chronic diseases.   She had a nodule removed from her left chest in Feb.  Is bothered by the appearance of the remaining tissue.    She is getting nauseous in the mornings and thinks it's the exemestane.  Feels tired all the time.    She's getting pain on her right side that began the day before yesterday--hurts when she coughs or takes a big breath.  Not there all of the time--did not happen during her exam.    Is having much increased frequency and hematuria lately.  Is much more confused than when I last saw her.  Asks about my mother and talking about something about when she saw Dr. Electa Sniff way back in the day.    Lipids due but not appropriate to check in 86 yo with dementia and breast cancer.    Past Medical History:  Diagnosis Date  . Alzheimer's disease ([place])   . Cancer ([place])   . Disorder of bone and cartilage, unspecified   . Dizziness and giddiness   . Hyperlipidemia LDL goal < 100   . Hypertension   . Hypothyroidism   . Malignant neoplasm of thyroid gland ([place])   . Nontoxic uninodular goiter   . Other malaise and fatigue   . Other symptoms involving cardiovascular  system   . Phlebitis and thrombophlebitis of unspecified site   . Senile osteoporosis   . Sleep related leg cramps   . Stroke ([place])   . Unspecified disorder of lipoid metabolism   . Unspecified hereditary and idiopathic peripheral neuropathy   . Varicose veins of lower extremities with inflammation     Past Surgical History:  Procedure Laterality Date  . ABDOMINAL HYSTERECTOMY     DR HUGES  . BREAST BIOPSY Left 05/21/2016   malignant  . CATRACT SURGERY  2008  . COLON SURGERY     clipped polyp during colonoscopy  . EXCISION OF KELOID Left 02/19/2020   Procedure: EXCISION NODULE LEFT CHEST WALL;  Surgeon: Jovita Kussmaul, MD;  Location: [facility];  Service: General;  Laterality: Left;  . KNEE SURGERY     right  . MASTECTOMY MODIFIED RADICAL Left 09/10/2019   Procedure: LEFT MASTECTOMY MODIFIED RADICAL;  Surgeon: Jovita Kussmaul, MD;  Location: [facility];  Service: General;  Laterality: Left;  . PARATHYROID ADENOMA REMOVED  1983  . THYROID SURGERY  July 20, 2011    No Known Allergies  Outpatient Encounter Medications as of 05/12/2020  Medication Sig  . acetaminophen (TYLENOL) 325 MG tablet Take 2 tablets (650 mg total) by mouth every 6 (six) hours as needed for  mild pain, moderate pain, fever or headache.  . alendronate (FOSAMAX) 70 MG tablet TAKE 1 TABLET (70 MG TOTAL) EVERY 7 (SEVEN) DAYS. TAKE WITH A FULL GLASS OF WATER ON AN EMPTY STOMACH.  Marland Kitchen allopurinol (ZYLOPRIM) 300 MG tablet TAKE 1 TABLET EVERY DAY  . amLODipine (NORVASC) 10 MG tablet TAKE 1 TABLET EVERY DAY  . aspirin EC 81 MG tablet Take 1 tablet (81 mg total) by mouth daily.  Marland Kitchen atenolol (TENORMIN) 25 MG tablet TAKE 1 TABLET EVERY DAY  . atorvastatin (LIPITOR) 40 MG tablet TAKE 1 TABLET EVERY DAY  . calcium carbonate (TUMS - DOSED IN MG ELEMENTAL CALCIUM) 500 MG chewable tablet Chew 1 tablet by mouth as needed for indigestion or heartburn.  . Cholecalciferol (VITAMIN D) 50 MCG (2000 UT) CAPS Take 1 capsule by  mouth 3 (three) times a week.  Marland Kitchen exemestane (AROMASIN) 25 MG tablet Take 1 tablet (25 mg total) by mouth daily after breakfast.  . HYDROcodone-acetaminophen (NORCO/VICODIN) 5-325 MG tablet Take 1-2 tablets by mouth every 6 (six) hours as needed for moderate pain or severe pain.  . hydrOXYzine (ATARAX/VISTARIL) 10 MG tablet Take 1 tablet (10 mg total) by mouth 3 (three) times daily as needed.  . iron polysaccharides (NIFEREX) 150 MG capsule Take 1 capsule (150 mg total) by mouth every other day.  . levothyroxine (SYNTHROID) 112 MCG tablet TAKE 1 TABLET BY MOUTH EVERY DAY  . lisinopril (ZESTRIL) 2.5 MG tablet TAKE 1 TABLET DAILY TO CONTROL BLOOD PRESSURE  . magnesium hydroxide (MILK OF MAGNESIA) 400 MG/5ML suspension Take 5-10 mLs by mouth daily as needed for mild constipation.  . sertraline (ZOLOFT) 50 MG tablet TAKE ONE TABLET DAILY FOR DEPRESSION   No facility-administered encounter medications on file as of 05/12/2020.    Review of Systems:  Review of Systems  Constitutional: Positive for malaise/fatigue. Negative for chills and fever.  HENT: Positive for hearing loss. Negative for congestion.   Eyes: Negative for blurred vision.  Respiratory: Negative for cough and shortness of breath.   Cardiovascular: Positive for leg swelling. Negative for chest pain, palpitations, orthopnea and PND.  Gastrointestinal: Positive for constipation. Negative for abdominal pain and diarrhea.  Genitourinary: Positive for flank pain, frequency, hematuria and urgency. Negative for dysuria.  Musculoskeletal: Positive for joint pain. Negative for falls.  Skin: Negative for itching and rash.  Neurological: Negative for dizziness and loss of consciousness.       Unsteady gait, weakness  Endo/Heme/Allergies: Does not bruise/bleed easily.  Psychiatric/Behavioral: Positive for depression and memory loss. The patient is not nervous/anxious and does not have insomnia.        More confused than baseline    Health  Maintenance  Topic Date Due  . INFLUENZA VACCINE  07/31/2020  . TETANUS/TDAP  06/23/2028  . DEXA SCAN  Completed  . COVID-19 Vaccine  Completed  . PNA vac Low Risk Adult  Completed    Physical Exam: Vitals:   05/12/20 1510  BP: (!) 150/92  Pulse: 98  Temp: (!) 97.5 F (36.4 C)  TempSrc: Temporal  SpO2: 97%  Weight: 155 lb (70.3 kg)  Height: 5\' 1"  (1.549 m)   Body mass index is 29.29 kg/m. Physical Exam Vitals reviewed.  Constitutional:      Comments: Not herself--keeps saying "I'm getting old"  HENT:     Head: Normocephalic and atraumatic.  Cardiovascular:     Rate and Rhythm: Normal rate and regular rhythm.  Pulmonary:     Effort: Pulmonary effort is normal.  Breath sounds: Normal breath sounds. No wheezing, rhonchi or rales.  Chest:     Comments: Left breast tissue area with multiple lumps and scars, some tenderness Abdominal:     General: Bowel sounds are normal.     Palpations: Abdomen is soft.  Neurological:     Mental Status: She is alert.     Comments: Walking with walker--much slower than last time  Psychiatric:     Comments: Down today, still jokes a little with me     Labs reviewed: Basic Metabolic Panel: Recent Labs    11/04/19 1145 01/18/20 1125 02/04/20 1019  NA 143 143 140  K 3.7 3.3* 3.9  CL 109 106 105  CO2 23 28 27   GLUCOSE 141* 91 99  BUN 17 13 21   CREATININE 0.95 0.90* 1.13*  CALCIUM 9.4 9.5 9.8   Liver Function Tests: Recent Labs    09/23/19 1105 11/04/19 1145 02/04/20 1019  AST 23 38 29  ALT 18 34 27  ALKPHOS 63 65 61  BILITOT 0.6 0.5 0.6  PROT 6.9 7.4 7.4  ALBUMIN 3.5 3.6 3.9   No results for input(s): LIPASE, AMYLASE in the last 8760 hours. No results for input(s): AMMONIA in the last 8760 hours. CBC: Recent Labs    11/04/19 1145 01/18/20 1125 02/04/20 1019  WBC 4.4 4.9 4.2  NEUTROABS 2.9 3,136 2.9  HGB 11.4* 11.6* 11.0*  HCT 36.7 36.5 35.7*  MCV 94.6 92.4 93.9  PLT 198 229 206   Lipid Panel: No  results for input(s): CHOL, HDL, LDLCALC, TRIG, CHOLHDL, LDLDIRECT in the last 8760 hours. Lab Results  Component Value Date   HGBA1C 5.0 11/09/2015    Assessment/Plan 1. Gross hematuria - r/o UTI b/c mentation seems considerably worse to me, as well, and [name] also has noted this - Culture, Urine - Urinalysis, Routine w reflex microscopic  2. Vascular dementia without behavioral disturbance ([place]) -declining further -she lives with her sister, but she is also of advanced age (not sure age) and is requesting more assistance for Rosheena now in the home--mentions attempt to get on meals on wheels list did not work out -she needs help with her ADLs now as she's getting more unsteady  -referred to [facility] possibly for home care assistance to help [name] out--pt not one to socialize so not likely to be good candidate for the memory center (plus she's becoming quite debilitated now)  3. Malignant neoplasm of overlapping sites of left breast in female, estrogen receptor positive ([place]) -is s/p left modified radical mastectomy 09/10/19 and then resection of a nodule 02/19/20 (growth despite several short courses of different txs--anastrazole neoadjuvantly, then letrozole, fulvestrant both were eventually stopped) -has refused XRT and is on exemestane which she believes is making her weaker and feel worse at this point (started 12/20)--it is to control the cancer--she does not want more surgery.  4. Depression, major, single episode, mild (H[facility]) -cont zoloft therapy  5. Balance problem - worse lately, will refer for home health for this, as well as determining functional needs some more at this point - Ambulatory referral to [facility]  Labs/tests ordered:   Lab Orders     Culture, Urine     MICROSCOPIC MESSAGE     Urinalysis, Routine w reflex microscopic  Next appt: 09/22/2020 med mgt  Delonte Musich L. Daimion Adamcik, D.O. [facility] [address], [place] [address] Cell Phone (Mon-Fri 8am-5pm):  [phone] On Call:  [phone] & follow prompts after 5pm & weekends Office Phone:  [phone] Office Fax:  [phone]

## 2020-05-13 LAB — URINE CULTURE
MICRO NUMBER:: 10477173
Result:: NO GROWTH
SPECIMEN QUALITY:: ADEQUATE

## 2020-05-13 LAB — URINALYSIS, ROUTINE W REFLEX MICROSCOPIC
Bacteria, UA: NONE SEEN /HPF
Bilirubin Urine: NEGATIVE
Glucose, UA: NEGATIVE
Hgb urine dipstick: NEGATIVE
Hyaline Cast: NONE SEEN /LPF
Ketones, ur: NEGATIVE
Nitrite: NEGATIVE
RBC / HPF: NONE SEEN /HPF (ref 0–2)
Specific Gravity, Urine: 1.02 (ref 1.001–1.03)
pH: 7 (ref 5.0–8.0)

## 2020-05-13 NOTE — Progress Notes (Signed)
Notify Shelia Marks:  Initial urine suspicious for infection.  Await final culture.

## 2020-05-14 DIAGNOSIS — I10 Essential (primary) hypertension: Secondary | ICD-10-CM | POA: Diagnosis not present

## 2020-05-14 DIAGNOSIS — R040 Epistaxis: Secondary | ICD-10-CM | POA: Diagnosis not present

## 2020-05-14 DIAGNOSIS — R58 Hemorrhage, not elsewhere classified: Secondary | ICD-10-CM | POA: Diagnosis not present

## 2020-05-14 NOTE — Progress Notes (Signed)
Please notify Shelia Marks:  No bacteria grew out on Shelia Marks's urine culture so her cognitive changes are likely due to progression of her dementia which is expected.   No antibiotics are needed.

## 2020-05-15 ENCOUNTER — Other Ambulatory Visit: Payer: Self-pay

## 2020-05-15 ENCOUNTER — Encounter (HOSPITAL_COMMUNITY): Payer: Self-pay | Admitting: Emergency Medicine

## 2020-05-15 ENCOUNTER — Emergency Department (HOSPITAL_COMMUNITY)
Admission: EM | Admit: 2020-05-15 | Discharge: 2020-05-15 | Disposition: A | Discharge status: Home / Self Care | Payer: Medicare Other | Attending: Emergency Medicine | Admitting: Emergency Medicine

## 2020-05-15 DIAGNOSIS — R040 Epistaxis: Secondary | ICD-10-CM | POA: Diagnosis not present

## 2020-05-15 DIAGNOSIS — E039 Hypothyroidism, unspecified: Secondary | ICD-10-CM | POA: Insufficient documentation

## 2020-05-15 DIAGNOSIS — G309 Alzheimer's disease, unspecified: Secondary | ICD-10-CM | POA: Diagnosis not present

## 2020-05-15 DIAGNOSIS — I10 Essential (primary) hypertension: Secondary | ICD-10-CM | POA: Diagnosis not present

## 2020-05-15 DIAGNOSIS — Z7982 Long term (current) use of aspirin: Secondary | ICD-10-CM | POA: Diagnosis not present

## 2020-05-15 DIAGNOSIS — Z79899 Other long term (current) drug therapy: Secondary | ICD-10-CM | POA: Insufficient documentation

## 2020-05-15 MED ORDER — CEPHALEXIN 500 MG PO CAPS
500.0000 mg | ORAL_CAPSULE | Freq: Once | ORAL | Status: AC
  Administered 2020-05-15: 500 mg via ORAL
  Filled 2020-05-15: qty 1

## 2020-05-15 MED ORDER — HYDROCODONE-ACETAMINOPHEN 5-325 MG PO TABS: 0.5000 | ORAL_TABLET | Freq: Four times a day (QID) | ORAL | 0 refills | Status: DC | PRN

## 2020-05-15 MED ORDER — HYDROCODONE-ACETAMINOPHEN 5-325 MG PO TABS
1.0000 | ORAL_TABLET | Freq: Once | ORAL | Status: AC
  Administered 2020-05-15: 1 via ORAL
  Filled 2020-05-15: qty 1

## 2020-05-15 MED ORDER — CEPHALEXIN 500 MG PO CAPS
500.0000 mg | ORAL_CAPSULE | Freq: Four times a day (QID) | ORAL | 0 refills | Status: DC
Start: 2020-05-15 — End: 2020-08-17

## 2020-05-15 NOTE — ED Notes (Signed)
Pt. Resting , no active nose bleeding noted.

## 2020-05-15 NOTE — ED Provider Notes (Signed)
Emergency Department Provider Note  I have reviewed the triage vital signs and the nursing notes.  HISTORY  Chief Complaint Epistaxis   HPI Shelia Marks is a 86 y.o. female with multimedical problems document below who presents to the emergency department today with a left-sided nosebleed.  Apparently patient has a history of this in the past and is usually able to take care of her self however tonight she was not apparently is been bleeding throughout the day.  EMS tried Afrin and a gauze but patient keeps manipulating her nose making it difficult to maintain hemostasis.  Patient does not feel lightheaded or syncopal at this time.  No weakness.  No trauma prior to the nosebleed.   No other associated or modifying symptoms.    Past Medical History:  Diagnosis Date  . Alzheimer's disease ([place])   . Cancer ([place])   . Disorder of bone and cartilage, unspecified   . Dizziness and giddiness   . Hyperlipidemia LDL goal < 100   . Hypertension   . Hypothyroidism   . Malignant neoplasm of thyroid gland ([place])   . Nontoxic uninodular goiter   . Other malaise and fatigue   . Other symptoms involving cardiovascular system   . Phlebitis and thrombophlebitis of unspecified site   . Senile osteoporosis   . Sleep related leg cramps   . Stroke ([place])   . Unspecified disorder of lipoid metabolism   . Unspecified hereditary and idiopathic peripheral neuropathy   . Varicose veins of lower extremities with inflammation     Patient Active Problem List   Diagnosis Date Noted  . Iron deficiency anemia 02/04/2020  . Malignant neoplasm of overlapping sites of left breast in female, estrogen receptor positive ([place]) 11/13/2018  . Varicose veins of leg with pain, right 06/23/2018  . Advanced care planning/counseling discussion 02/07/2017  . Osteoarthritis of left knee 11/05/2016  . Loss of weight 06/01/2016  . Depression, major, single episode, mild ([place]) 06/01/2016  . Vascular dementia ([place])  06/01/2016  . Ductal carcinoma in situ (DCIS) of left breast 05/21/2016  . Ataxia, late effect of cerebrovascular disease 04/23/2014  . Right pontine stroke ([place]) 04/18/2014  . Hypothyroidism, postsurgical 04/18/2014  . Dysphagia as late effect of stroke 04/18/2014  . Essential hypertension, benign 04/18/2014  . Constipation, slow transit 04/18/2014  . 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 Senile osteoporosis   . Hyperlipidemia LDL goal < 100     Past Surgical History:  Procedure Laterality Date  . ABDOMINAL HYSTERECTOMY     DR HUGES  . BREAST BIOPSY Left 05/21/2016   malignant  . CATRACT SURGERY  2008  . COLON SURGERY     clipped polyp during colonoscopy  . EXCISION OF KELOID Left 02/19/2020   Procedure: EXCISION NODULE LEFT CHEST WALL;  Surgeon: Jovita Kussmaul, MD;  Location: [facility];  Service: General;  Laterality: Left;  . KNEE SURGERY     right  . MASTECTOMY MODIFIED RADICAL Left 09/10/2019   Procedure: LEFT MASTECTOMY MODIFIED RADICAL;  Surgeon: Jovita Kussmaul, MD;  Location: [facility];  Service: General;  Laterality: Left;  . PARATHYROID ADENOMA REMOVED  1983  . THYROID SURGERY  July 20, 2011    Current Outpatient Rx  . Order #: [id number] Class: OTC  . Order #: [id number] Class: Normal  . Order #: [id number] Class: Normal  . Order #: [id number] Class: Normal  . Order #: [id number] Class: OTC  . Order #: [id number] Class: Normal  . Order #: [id number] Class: Normal  .  Order #: [id number] Class: Normal  . Order #: [id number] Class: Normal  . Order #: [id number] Class: Normal  . Order #: [id number] Class: Historical Med  . Order #: [id number] Class: Normal  . Order #: [id number] Class: Print  . Order #: [id number] Class: Print    Allergies Patient has no known allergies.  Family History  Problem Relation Age of Onset  . Diabetes Brother     Social History Social History   Tobacco Use  . Smoking status: Never Smoker  . Smokeless tobacco: Never Used    Substance Use Topics  . Alcohol use: No  . Drug use: No    Review of Systems  All other systems negative except as documented in the HPI. All pertinent positives and negatives as reviewed in the HPI. ____________________________________________  PHYSICAL EXAM:  VITAL SIGNS: ED Triage Vitals [05/15/20 0030]  Enc Vitals Group     BP (!) 148/104     Pulse Rate 77     Resp 20     Temp      Temp src      SpO2 100 %    Constitutional: Alert and oriented. Well appearing and in no acute distress. Eyes: Conjunctivae are normal. PERRL. EOMI. Head: Atraumatic. Nose: oozing blood through and around gauze in left nare Mouth/Throat: Mucous membranes are moist.  Oropharynx non-erythematous. Neck: No stridor.  No meningeal signs.   Cardiovascular: Normal rate, regular rhythm. Good peripheral circulation. Grossly normal heart sounds.   Respiratory: Normal respiratory effort.  No retractions. Lungs CTAB. Gastrointestinal: Soft and nontender. No distention.  Musculoskeletal: No lower extremity tenderness nor edema. No gross deformities of extremities. Neurologic:  Normal speech and language. No gross focal neurologic deficits are appreciated.  Skin:  Skin is warm, dry and intact. No rash noted.  ____________________________________________   LABS (all labs ordered are listed, but only abnormal results are displayed)  Labs Reviewed - No data to display ____________________________________________  EKG   EKG Interpretation  Date/Time:    Ventricular Rate:    PR Interval:    QRS Duration:   QT Interval:    QTC Calculation:   R Axis:     Text Interpretation:        ____________________________________________  RADIOLOGY  No results found. ____________________________________________  PROCEDURES  Procedure(s) performed:   .Epistaxis Management  Date/Time: 05/15/2020 8:12 AM Performed by: Merrily Pew, MD Authorized by: Merrily Pew, MD   Consent:    Consent  obtained:  Verbal   Consent given by:  Patient   Risks discussed:  Bleeding and infection Procedure details:    Treatment site:  L anterior   Treatment method:  Nasal balloon   Treatment complexity:  Limited   Treatment episode: recurring   Post-procedure details:    Assessment:  Bleeding stopped   Patient tolerance of procedure:  Tolerated well, no immediate complications   ____________________________________________  INITIAL IMPRESSION / ASSESSMENT AND PLAN / ED COURSE   This patient presents to the ED for concern of epistaxis, this involves an extensive number of treatment options, and is a complaint that carries with it a high risk of complications and morbidity.  The differential diagnosis includes anterior vs posterior nose bleed.      Lab Tests:   I Ordered, reviewed, and interpreted labs, which included none indicated  Medicines ordered:   I ordered medication Afrin for nosebleed  Imaging Studies ordered:   I independently visualized and interpreted imaging not indicated  Additional history obtained:   Additional history obtained from sister at bedside  Previous records obtained and reviewed in epic  Consultations Obtained:   I consulted no one and discussed lab and imaging findings  Reevaluation:  After the interventions stated above, I reevaluated the patient and he has significantly improved bleeding after rapid Rhino.  Will start antibiotics and give short course of pain medication secondary to the discomfort.  Follow-up with ENT to get the packing removed and further evaluation.  Critical Interventions: Rapid Rhino  A medical screening exam was performed and I feel the patient has had an appropriate workup for their chief complaint at this time and likelihood of emergent condition existing is low. They have been counseled on decision, discharge, follow up and which symptoms necessitate immediate return to the emergency department. They or their  family verbally stated understanding and agreement with plan and discharged in stable condition.   ____________________________________________  FINAL CLINICAL IMPRESSION(S) / ED DIAGNOSES  Final diagnoses:  Epistaxis    MEDICATIONS GIVEN DURING THIS VISIT:  Medications  HYDROcodone-acetaminophen (NORCO/VICODIN) 5-325 MG per tablet 1 tablet (1 tablet Oral Given 05/15/20 0504)  cephALEXin (KEFLEX) capsule 500 mg (500 mg Oral Given 05/15/20 0504)    NEW OUTPATIENT MEDICATIONS STARTED DURING THIS VISIT:  Discharge Medication List as of 05/15/2020  4:46 AM    START taking these medications   Details  cephALEXin (KEFLEX) 500 MG capsule Take 1 capsule (500 mg total) by mouth 4 (four) times daily. Or until you get the nasal packing removed, Starting Sun 05/15/2020, Print        Note:  This note was prepared with assistance of Dragon voice recognition software. Occasional wrong-word or sound-a-like substitutions may have occurred due to the inherent limitations of voice recognition software.   Mical Brun, Corene Cornea, MD 05/15/20 [phone]

## 2020-05-15 NOTE — ED Triage Notes (Signed)
As per EMS pt nose has been bleeding all day.pt was given saline spray. BP178/84 Pulse 78 R16 SPO2:100%

## 2020-05-16 ENCOUNTER — Telehealth: Payer: Self-pay | Admitting: *Deleted

## 2020-05-16 ENCOUNTER — Ambulatory Visit: Payer: Medicare Other | Admitting: Internal Medicine

## 2020-05-16 NOTE — Telephone Encounter (Signed)
This RN spoke with pt's caregiver- Bryon Lions per her call stating concern for appointment tomorrow and probable need to reschedule.  She states Jennean had a nose bleed ( has history of ) over the weekend that did not stop on it's own - and they had to take her to the ER.  Today she is weak and " that thing they have in her nose is bothering her"  Request to reschedule appointment sent to scheduling per request.

## 2020-05-17 ENCOUNTER — Inpatient Hospital Stay: Payer: Medicare Other

## 2020-05-17 ENCOUNTER — Ambulatory Visit: Payer: Medicare Other | Admitting: Oncology

## 2020-05-20 DIAGNOSIS — R040 Epistaxis: Secondary | ICD-10-CM | POA: Diagnosis not present

## 2020-05-22 NOTE — Progress Notes (Signed)
[facility]  Telephone:([phone] Fax:([phone]) [phone]    ID: Shelia Marks DOB: [birth year]  MR#: [id number]  ZES#:[id number]  Patient Care Team: Gayland Curry, DO as PCP - General (Geriatric Medicine) Rutherford Guys, MD as Consulting Physician (Ophthalmology) Magrinat, Virgie Dad, MD as Consulting Physician (Oncology) Jovita Kussmaul, MD as Consulting Physician (General Surgery) Jacelyn Pi, MD as Consulting Physician (Endocrinology) OTHER MD:   CHIEF COMPLAINT: Estrogen receptor positive breast cancer (s/p left mastectomy)  CURRENT TREATMENT: exemestane   INTERVAL HISTORY: Shelia Marks returns today for follow-up of her estrogen receptor positive breast cancer. She is accompanied by her sister, [name].  She had an earlier appointment but there had been a problem with a nosebleed and we rescheduled it for today.  She was switched to exemestane in 12/2019.  She is tolerating this with no side effects that she is aware of.  In particular she is not having any hot flashes, which is the most common problem.   REVIEW OF SYSTEMS: Shelia Marks tells me she has not been feeling well.  She cannot be very specific about this but her Sister [name], who is her primary caregiver, tells me that she has declined greatly in the last week.  She had a sore spot on her lip that got a little bit bigger.  It hurts a little but it has not bled.  The patient has been dizzy nauseated complaining of abdominal discomfort and not wanting to eat breakfast.  She is irritable.  She has a soreness in her right shoulder where she had 1 dose of the coronavirus vaccine.  Her urine is dark.  They saw her primary care physician who checked a urine culture and it was negative.  The sister wonders if Shelia Marks should be in the hospital for further evaluation.  She tells me her physician has arranged for some home health health help for them and that someone is coming to their house tomorrow 2 PM to discuss that  further and get it set up.   BREAST CANCER HISTORY: From the original intake note:  "Shelia Marks" had bilateral screening mammography at the [facility] 06/07/2016 showing calcifications in the left breast. She was recalled  05/15/2016 for left diagnostic mammography. This showed the breast density to be category be. In the upper outer left breast there was an area of suspicious calcifications measuring 1.7 cm.  Biopsy of this area was obtained 05/21/2016, and showed ([id number] [phone]) ductal carcinoma in situ, high-grade, estrogen receptor 100% positive, progesterone receptor 5% positive, WITH strong staining intensity.  Her subsequent history is as detailed below.   PAST MEDICAL HISTORY: Past Medical History:  Diagnosis Date  . Alzheimer's disease ([place])   . Cancer ([place])   . Disorder of bone and cartilage, unspecified   . Dizziness and giddiness   . Hyperlipidemia LDL goal < 100   . Hypertension   . Hypothyroidism   . Malignant neoplasm of thyroid gland ([place])   . Nontoxic uninodular goiter   . Other malaise and fatigue   . Other symptoms involving cardiovascular system   . Phlebitis and thrombophlebitis of unspecified site   . Senile osteoporosis   . Sleep related leg cramps   . Stroke ([place])   . Unspecified disorder of lipoid metabolism   . Unspecified hereditary and idiopathic peripheral neuropathy   . Varicose veins of lower extremities with inflammation     PAST SURGICAL HISTORY: Past Surgical History:  Procedure Laterality Date  . ABDOMINAL HYSTERECTOMY  DR HUGES  . BREAST BIOPSY Left 05/21/2016   malignant  . CATRACT SURGERY  2008  . COLON SURGERY     clipped polyp during colonoscopy  . EXCISION OF KELOID Left 02/19/2020   Procedure: EXCISION NODULE LEFT CHEST WALL;  Surgeon: Jovita Kussmaul, MD;  Location: [facility];  Service: General;  Laterality: Left;  . KNEE SURGERY     right  . MASTECTOMY MODIFIED RADICAL Left 09/10/2019   Procedure: LEFT  MASTECTOMY MODIFIED RADICAL;  Surgeon: Jovita Kussmaul, MD;  Location: [facility];  Service: General;  Laterality: Left;  . PARATHYROID ADENOMA REMOVED  1983  . THYROID SURGERY  July 20, 2011    FAMILY HISTORY Family History  Problem Relation Age of Onset  . Diabetes Brother    The patient's father died from liver problems in his 48s. The patient's mother died in her 90s from "natural causes". The patient had no brothers. She had 6 sisters, 2 of whom died from complications of diabetes and one from a ruptured aneurysm. There is no history of breast or ovarian cancer in the family.    GYNECOLOGIC HISTORY:  No LMP recorded. Patient has had a hysterectomy.  Sharma does not remember how she was when she had her first period. She never carried a child to term. She is status post hysterectomy and at least one ovary was removed. She never took hormone replacement.    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  She is widowed and lives with her sister.   ADVANCED DIRECTIVES: [name], Shelia Marks, is her healthcare power of attorney. [name] can be reached at [phone].   HEALTH MAINTENANCE: Social History   Tobacco Use  . Smoking status: Never Smoker  . Smokeless tobacco: Never Used  Substance Use Topics  . Alcohol use: No  . Drug use: No     No Known Allergies  Current Outpatient Medications  Medication Sig Dispense Refill  . acetaminophen (TYLENOL) 325 MG tablet Take 2 tablets (650 mg total) by mouth every 6 (six) hours as needed for mild pain, moderate pain, fever or headache.    . alendronate (FOSAMAX) 70 MG tablet TAKE 1 TABLET (70 MG TOTAL) EVERY 7 (SEVEN) DAYS. TAKE WITH A FULL GLASS OF WATER ON AN EMPTY STOMACH. (Patient taking differently: Take 70 mg by mouth every Monday. ) 12 tablet 1  . allopurinol (ZYLOPRIM) 300 MG tablet TAKE 1 TABLET EVERY DAY (Patient taking differently: Take 300 mg by mouth daily. ) 90 tablet 1  .  amLODipine (NORVASC) 10 MG tablet TAKE 1 TABLET EVERY DAY (Patient taking differently: Take 10 mg by mouth daily. ) 90 tablet 1  . aspirin EC 81 MG tablet Take 1 tablet (81 mg total) by mouth daily. 30 tablet 3  . atenolol (TENORMIN) 25 MG tablet TAKE 1 TABLET EVERY DAY (Patient taking differently: Take 25 mg by mouth daily. ) 90 tablet 1  . atorvastatin (LIPITOR) 40 MG tablet TAKE 1 TABLET EVERY DAY (Patient taking differently: Take 40 mg by mouth daily. ) 90 tablet 0  . cephALEXin (KEFLEX) 500 MG capsule Take 1 capsule (500 mg total) by mouth 4 (four) times daily. Or until you get the nasal packing removed 40 capsule 0  . exemestane (AROMASIN) 25 MG tablet Take 1 tablet (25 mg total) by mouth daily after breakfast. 90 tablet 3  . HYDROcodone-acetaminophen (NORCO/VICODIN) 5-325 MG tablet Take 0.5-1 tablets by  mouth every 6 (six) hours as needed for severe pain. 10 tablet 0  . levothyroxine (SYNTHROID) 112 MCG tablet TAKE 1 TABLET BY MOUTH EVERY DAY (Patient taking differently: Take 112 mcg by mouth daily before breakfast. ) 90 tablet 3  . lisinopril (ZESTRIL) 2.5 MG tablet TAKE 1 TABLET DAILY TO CONTROL BLOOD PRESSURE (Patient taking differently: Take 2.5 mg by mouth daily. ) 90 tablet 1  . magnesium hydroxide (MILK OF MAGNESIA) 400 MG/5ML suspension Take 5-10 mLs by mouth daily as needed for mild constipation.    . sertraline (ZOLOFT) 50 MG tablet TAKE ONE TABLET DAILY FOR DEPRESSION (Patient taking differently: Take 50 mg by mouth daily. ) 90 tablet 1   No current facility-administered medications for this visit.    OBJECTIVE: African-American woman examined in a wheelchair Vitals:   05/23/20 1539  BP: (!) 152/72  Pulse: 74  Resp: 14  Temp: 98.7 F (37.1 C)  SpO2: 100%     Body mass index is 29.68 kg/m.    ECOG FS:2 - Symptomatic, <50% confined to bed  Sclerae unicteric, EOMs intact In the upper lip there is a slightly swollen area, measuring about a half a centimeter, which is not a  blister and looks more like prays where she may have had minor trauma. Lungs no rales or rhonchi Heart regular rate and rhythm Abd soft, nontender, positive bowel sounds Neuro: nonfocal, appropriate affect Breasts: Deferred   LAB RESULTS:  CMP     Component Value Date/Time   NA 140 05/23/2020 1454   NA 143 06/01/2016 0958   K 3.5 05/23/2020 1454   CL 108 05/23/2020 1454   CO2 24 05/23/2020 1454   GLUCOSE 84 05/23/2020 1454   BUN 16 05/23/2020 1454   BUN 21 06/01/2016 0958   CREATININE 1.00 05/23/2020 1454   CREATININE 1.13 (H) 02/04/2020 1019   CREATININE 0.90 (H) 01/18/2020 1125   CALCIUM 9.3 05/23/2020 1454   PROT 7.0 05/23/2020 1454   PROT 6.8 11/09/2015 1013   ALBUMIN 3.2 (L) 05/23/2020 1454   ALBUMIN 4.1 11/09/2015 1013   AST 25 05/23/2020 1454   AST 29 02/04/2020 1019   ALT 20 05/23/2020 1454   ALT 27 02/04/2020 1019   ALKPHOS 74 05/23/2020 1454   BILITOT 0.6 05/23/2020 1454   BILITOT 0.6 02/04/2020 1019   GFRNONAA 49 (L) 05/23/2020 1454   GFRNONAA 42 (L) 02/04/2020 1019   GFRNONAA 51 (L) 02/25/2018 0937   GFRAA 57 (L) 05/23/2020 1454   GFRAA 49 (L) 02/04/2020 1019   GFRAA 59 (L) 02/25/2018 0937    INo results found for: SPEP, UPEP  Lab Results  Component Value Date   WBC 5.2 05/23/2020   NEUTROABS 3.4 05/23/2020   HGB 9.6 (L) 05/23/2020   HCT 31.1 (L) 05/23/2020   MCV 94.5 05/23/2020   PLT 224 05/23/2020      Chemistry      Component Value Date/Time   NA 140 05/23/2020 1454   NA 143 06/01/2016 0958   K 3.5 05/23/2020 1454   CL 108 05/23/2020 1454   CO2 24 05/23/2020 1454   BUN 16 05/23/2020 1454   BUN 21 06/01/2016 0958   CREATININE 1.00 05/23/2020 1454   CREATININE 1.13 (H) 02/04/2020 1019   CREATININE 0.90 (H) 01/18/2020 1125      Component Value Date/Time   CALCIUM 9.3 05/23/2020 1454   ALKPHOS 74 05/23/2020 1454   AST 25 05/23/2020 1454   AST 29 02/04/2020 1019   ALT 20  05/23/2020 1454   ALT 27 02/04/2020 1019   BILITOT 0.6  05/23/2020 1454   BILITOT 0.6 02/04/2020 1019       No results found for: LABCA2  No components found for: HQION629  No results for input(s): INR in the last 168 hours.  Urinalysis    Component Value Date/Time   COLORURINE YELLOW 05/23/2020 1454   APPEARANCEUR CLEAR 05/23/2020 1454   LABSPEC 1.016 05/23/2020 1454   PHURINE 6.0 05/23/2020 1454   GLUCOSEU NEGATIVE 05/23/2020 1454   [place] 05/23/2020 [place] 05/23/2020 [place] 05/23/2020 1454   PROTEINUR NEGATIVE 05/23/2020 1454   UROBILINOGEN 0.2 03/05/2014 2055   NITRITE NEGATIVE 05/23/2020 1454   LEUKOCYTESUR SMALL (A) 05/23/2020 1454    STUDIES: No results found.   ELIGIBLE FOR AVAILABLE RESEARCH PROTOCOL: no   ASSESSMENT: 86 y.o. [place] woman status post left breast upper outer quadrant biopsy 05/21/2016 for ductal carcinoma in situ, high-grade, estrogen and progesterone receptor positive  (1) opted against surgery   (2) started anastrozole neoadjuvantly 06/13/2016, discontinued June 2018 due to osteoporosis concerns  (a)  bone density 11/16/2016 shows a T score of -4.7 (osteoporosis)-on fosamax.   (3) mammography/ultrasonography 06/10/2018 shows evidence of disease progression, possible lymph node extension  (4) letrozole started 06/27/2018   (a) discontinued November 2019 with evidence of progression  (b) restarted 09/2019, to change to Exemestane in 12/2019  (5) fulvestrant started 11/21/2018, discontinued 03/12/2019  (a) mammography and ultrasonography 07/24/2019 shows evidence of disease progression  (6) Left modified radical mastectomy on 09/10/2019: showing a stage IIIA, T2, N2a, invasive ductal carcinoma, grade 3, margins negative, estrogen receptor positive, progesterone receptor and HER-2 negative, with an MIB-1 of 90%  (a) 4 out of 14 lymph nodes positive for macrometastases  (b) 1 out of 14 lymph nodes positive for micrometastases  (c) CT chest on  08/26/2019 negative for metastatic disease  (7) Adjuvant radiation recommended on 09/24/2019 and again on10/09/2019, declined by patient  (8) removal of the left chest wall lesion 02/19/2020 showing metastatic breast cancer, estrogen receptor 95% and strongly positive, progesterone receptor negative, HER-2 negative with an MIB-1 of 50%  (a) patient and sister again opted against adjuvant radiation at 03/07/2020 visit  (9) exemestane started December 2020   PLAN: Jenya is moderately anemic.  However hemoglobin of less than 10 can cause severe symptoms in the elderly as is the case with her.  We have set her up for transfusion of 1 unit tomorrow and I am hoping that will improve her functional status.  The question is were to go from there.  Normally we would start her on erythropoietin every 2 weeks but transportation is an issue.  Her Sister [name] who is her primary caregiver is already overstretched.  I will check with our social workers and see if they can arrange for transportation but even if we can get [place] here I am not sure her dementia would allow her to come by herself and [name] or another family member would still have to be involved.  Tentatively I am setting her up for another transfusion and EPO dose next week.  Total encounter time 25 minutes.Sarajane Jews C. Magrinat, MD 05/23/20 4:26 PM Medical Oncology and Hematology [facility] [place], [place] [address] Tel. [phone]    Fax. [phone]   I, Wilburn Mylar, am acting as scribe for Dr. Virgie Dad. Magrinat.  Lindie Spruce MD, have  reviewed the above documentation for accuracy and completeness, and I agree with the above.   *Total Encounter Time as defined by the Centers for Medicare and Medicaid Services includes, in addition to the face-to-face time of a patient visit (documented in the note above) non-face-to-face time: obtaining and reviewing outside history,  ordering and reviewing medications, tests or procedures, care coordination (communications with other health care professionals or caregivers) and documentation in the medical record.

## 2020-05-23 ENCOUNTER — Inpatient Hospital Stay (HOSPITAL_BASED_OUTPATIENT_CLINIC_OR_DEPARTMENT_OTHER): Payer: Medicare Other | Admitting: Oncology

## 2020-05-23 ENCOUNTER — Other Ambulatory Visit: Payer: Self-pay

## 2020-05-23 ENCOUNTER — Other Ambulatory Visit: Payer: Self-pay | Admitting: *Deleted

## 2020-05-23 ENCOUNTER — Telehealth: Payer: Self-pay | Admitting: *Deleted

## 2020-05-23 ENCOUNTER — Inpatient Hospital Stay: Payer: Medicare Other | Attending: Oncology

## 2020-05-23 VITALS — BP 152/72 | HR 74 | Temp 98.7°F | Resp 14 | Wt 157.1 lb

## 2020-05-23 DIAGNOSIS — Z170 Estrogen receptor positive status [ER+]: Secondary | ICD-10-CM

## 2020-05-23 DIAGNOSIS — E039 Hypothyroidism, unspecified: Secondary | ICD-10-CM | POA: Insufficient documentation

## 2020-05-23 DIAGNOSIS — C50812 Malignant neoplasm of overlapping sites of left female breast: Secondary | ICD-10-CM | POA: Diagnosis not present

## 2020-05-23 DIAGNOSIS — I6350 Cerebral infarction due to unspecified occlusion or stenosis of unspecified cerebral artery: Secondary | ICD-10-CM

## 2020-05-23 DIAGNOSIS — R309 Painful micturition, unspecified: Secondary | ICD-10-CM

## 2020-05-23 DIAGNOSIS — C50412 Malignant neoplasm of upper-outer quadrant of left female breast: Secondary | ICD-10-CM | POA: Insufficient documentation

## 2020-05-23 DIAGNOSIS — D631 Anemia in chronic kidney disease: Secondary | ICD-10-CM | POA: Diagnosis not present

## 2020-05-23 DIAGNOSIS — D649 Anemia, unspecified: Secondary | ICD-10-CM | POA: Insufficient documentation

## 2020-05-23 DIAGNOSIS — E785 Hyperlipidemia, unspecified: Secondary | ICD-10-CM | POA: Diagnosis not present

## 2020-05-23 DIAGNOSIS — Z79811 Long term (current) use of aromatase inhibitors: Secondary | ICD-10-CM | POA: Insufficient documentation

## 2020-05-23 DIAGNOSIS — R42 Dizziness and giddiness: Secondary | ICD-10-CM | POA: Insufficient documentation

## 2020-05-23 DIAGNOSIS — I10 Essential (primary) hypertension: Secondary | ICD-10-CM | POA: Diagnosis not present

## 2020-05-23 DIAGNOSIS — R109 Unspecified abdominal pain: Secondary | ICD-10-CM | POA: Diagnosis not present

## 2020-05-23 DIAGNOSIS — D0512 Intraductal carcinoma in situ of left breast: Secondary | ICD-10-CM

## 2020-05-23 DIAGNOSIS — N183 Chronic kidney disease, stage 3 unspecified: Secondary | ICD-10-CM

## 2020-05-23 LAB — CBC WITH DIFFERENTIAL/PLATELET
Abs Immature Granulocytes: 0.03 10*3/uL (ref 0.00–0.07)
Basophils Absolute: 0 10*3/uL (ref 0.0–0.1)
Basophils Relative: 1 %
Eosinophils Absolute: 0.1 10*3/uL (ref 0.0–0.5)
Eosinophils Relative: 1 %
HCT: 31.1 % — ABNORMAL LOW (ref 36.0–46.0)
Hemoglobin: 9.6 g/dL — ABNORMAL LOW (ref 12.0–15.0)
Immature Granulocytes: 1 %
Lymphocytes Relative: 24 %
Lymphs Abs: 1.3 10*3/uL (ref 0.7–4.0)
MCH: 29.2 pg (ref 26.0–34.0)
MCHC: 30.9 g/dL (ref 30.0–36.0)
MCV: 94.5 fL (ref 80.0–100.0)
Monocytes Absolute: 0.5 10*3/uL (ref 0.1–1.0)
Monocytes Relative: 9 %
Neutro Abs: 3.4 10*3/uL (ref 1.7–7.7)
Neutrophils Relative %: 64 %
Platelets: 224 10*3/uL (ref 150–400)
RBC: 3.29 MIL/uL — ABNORMAL LOW (ref 3.87–5.11)
RDW: 16 % — ABNORMAL HIGH (ref 11.5–15.5)
WBC: 5.2 10*3/uL (ref 4.0–10.5)
nRBC: 0 % (ref 0.0–0.2)

## 2020-05-23 LAB — URINALYSIS, COMPLETE (UACMP) WITH MICROSCOPIC
Bilirubin Urine: NEGATIVE
Glucose, UA: NEGATIVE mg/dL
Hgb urine dipstick: NEGATIVE
Ketones, ur: NEGATIVE mg/dL
Nitrite: NEGATIVE
Protein, ur: NEGATIVE mg/dL
Specific Gravity, Urine: 1.016 (ref 1.005–1.030)
pH: 6 (ref 5.0–8.0)

## 2020-05-23 LAB — COMPREHENSIVE METABOLIC PANEL
ALT: 20 U/L (ref 0–44)
AST: 25 U/L (ref 15–41)
Albumin: 3.2 g/dL — ABNORMAL LOW (ref 3.5–5.0)
Alkaline Phosphatase: 74 U/L (ref 38–126)
Anion gap: 8 (ref 5–15)
BUN: 16 mg/dL (ref 8–23)
CO2: 24 mmol/L (ref 22–32)
Calcium: 9.3 mg/dL (ref 8.9–10.3)
Chloride: 108 mmol/L (ref 98–111)
Creatinine, Ser: 1 mg/dL (ref 0.44–1.00)
GFR calc Af Amer: 57 mL/min — ABNORMAL LOW (ref >60)
GFR calc non Af Amer: 49 mL/min — ABNORMAL LOW (ref >60)
Glucose, Bld: 84 mg/dL (ref 70–99)
Potassium: 3.5 mmol/L (ref 3.5–5.1)
Sodium: 140 mmol/L (ref 135–145)
Total Bilirubin: 0.6 mg/dL (ref 0.3–1.2)
Total Protein: 7 g/dL (ref 6.5–8.1)

## 2020-05-23 NOTE — Telephone Encounter (Signed)
This RN spoke with pt's caregiver - Shelia Marks who states pt is overall not improving from discussion last week.  This RN offered for appointment to be rescheduled but Shelia Marks states they would prefer to come- "just want Dr Jana Hakim to know what is going on "  Of note pt has had increased weakness as well as more severe nose bleed 10 days ago that required ER visit with packing.  Shelia Marks states pt had nasal packing removed Friday but now seems to have " like fluid in her face ".  Shelia Marks states pt is very weak, having nausea and she has sores on her lip.  Per discussion the family wants pt seen today for follow up - with understanding that pt's current situation may not be related to her hx of cancer and current therapy ( exemestane ).  They saw her primary MD last week and had her urine checked - thinking she might have a UTI - "but then the office called [place] and told us the urine was ok "  Shelia Marks stated appreciation of above discussion and is hoping Dr Jana Hakim may be able to give them some insight into why the patient is having more rapid changes.  No further needs at this time.

## 2020-05-24 ENCOUNTER — Other Ambulatory Visit: Payer: Self-pay | Admitting: Emergency Medicine

## 2020-05-24 ENCOUNTER — Inpatient Hospital Stay: Payer: Medicare Other

## 2020-05-24 ENCOUNTER — Other Ambulatory Visit: Payer: Self-pay

## 2020-05-24 ENCOUNTER — Encounter: Payer: Self-pay | Admitting: Licensed Clinical Social Worker

## 2020-05-24 ENCOUNTER — Telehealth: Payer: Self-pay

## 2020-05-24 DIAGNOSIS — D631 Anemia in chronic kidney disease: Secondary | ICD-10-CM

## 2020-05-24 DIAGNOSIS — Z170 Estrogen receptor positive status [ER+]: Secondary | ICD-10-CM

## 2020-05-24 DIAGNOSIS — R109 Unspecified abdominal pain: Secondary | ICD-10-CM | POA: Diagnosis not present

## 2020-05-24 DIAGNOSIS — D0512 Intraductal carcinoma in situ of left breast: Secondary | ICD-10-CM

## 2020-05-24 DIAGNOSIS — E039 Hypothyroidism, unspecified: Secondary | ICD-10-CM | POA: Diagnosis not present

## 2020-05-24 DIAGNOSIS — C50412 Malignant neoplasm of upper-outer quadrant of left female breast: Secondary | ICD-10-CM | POA: Diagnosis not present

## 2020-05-24 DIAGNOSIS — Z79811 Long term (current) use of aromatase inhibitors: Secondary | ICD-10-CM | POA: Diagnosis not present

## 2020-05-24 DIAGNOSIS — C50812 Malignant neoplasm of overlapping sites of left female breast: Secondary | ICD-10-CM

## 2020-05-24 DIAGNOSIS — N183 Chronic kidney disease, stage 3 unspecified: Secondary | ICD-10-CM

## 2020-05-24 DIAGNOSIS — D649 Anemia, unspecified: Secondary | ICD-10-CM | POA: Diagnosis not present

## 2020-05-24 LAB — CBC WITH DIFFERENTIAL/PLATELET
Abs Immature Granulocytes: 0.02 10*3/uL (ref 0.00–0.07)
Basophils Absolute: 0 10*3/uL (ref 0.0–0.1)
Basophils Relative: 1 %
Eosinophils Absolute: 0.1 10*3/uL (ref 0.0–0.5)
Eosinophils Relative: 2 %
HCT: 30.8 % — ABNORMAL LOW (ref 36.0–46.0)
Hemoglobin: 9.5 g/dL — ABNORMAL LOW (ref 12.0–15.0)
Immature Granulocytes: 0 %
Lymphocytes Relative: 22 %
Lymphs Abs: 1 10*3/uL (ref 0.7–4.0)
MCH: 29.2 pg (ref 26.0–34.0)
MCHC: 30.8 g/dL (ref 30.0–36.0)
MCV: 94.8 fL (ref 80.0–100.0)
Monocytes Absolute: 0.5 10*3/uL (ref 0.1–1.0)
Monocytes Relative: 9 %
Neutro Abs: 3.2 10*3/uL (ref 1.7–7.7)
Neutrophils Relative %: 66 %
Platelets: 234 10*3/uL (ref 150–400)
RBC: 3.25 MIL/uL — ABNORMAL LOW (ref 3.87–5.11)
RDW: 16.1 % — ABNORMAL HIGH (ref 11.5–15.5)
WBC: 4.8 10*3/uL (ref 4.0–10.5)
nRBC: 0 % (ref 0.0–0.2)

## 2020-05-24 LAB — PREPARE RBC (CROSSMATCH)

## 2020-05-24 LAB — ABO/RH: ABO/RH(D): A POS

## 2020-05-24 MED ORDER — ACETAMINOPHEN 325 MG PO TABS
325.0000 mg | ORAL_TABLET | Freq: Once | ORAL | Status: AC
  Administered 2020-05-24: 325 mg via ORAL

## 2020-05-24 MED ORDER — ACETAMINOPHEN 325 MG PO TABS
ORAL_TABLET | ORAL | Status: AC
  Filled 2020-05-24: qty 2

## 2020-05-24 MED ORDER — SODIUM CHLORIDE 0.9% IV SOLUTION
250.0000 mL | Freq: Once | INTRAVENOUS | Status: AC
  Administered 2020-05-24: 250 mL via INTRAVENOUS
  Filled 2020-05-24: qty 250

## 2020-05-24 NOTE — Progress Notes (Signed)
IV team consult ordered for PIV for blood transfusion.  Rennis Chris RN (former IV team) started PIV successfully.  IV team paged/cancelled.  Pt received 1 unit PRBCs today, tolerated well.  VSS. Pt able to eat/drink during transfusion w/out any issues, declined to use restroom.  Sitter utilized, sister [name] aware that she will need to be present for future visits d/t pt's dementia.

## 2020-05-24 NOTE — Telephone Encounter (Signed)
Called Bryon Lions who is the caregiver/family member for [employer].  Explained that Kalyiah has had 3 IV attempts and she did not understand what she was signing for blood consent.  Advised her to return to pick Fawna up when available as we will have to reschedule her for tomorrow at 730 am or 230 pm.  She had her type and cross today and must have her bracelet on when she comes tomorrow.  Unfortunately, I had to leave this on a voicemail but I left my direct line [phone].  Advised her to call me with questions or concerns.  Gardiner Rhyme, RN

## 2020-05-24 NOTE — Progress Notes (Signed)
[facility] CSW Progress Note  Clinical Education officer, museum received request from Dr. Jana Hakim for assistance with transportation to appointments. Would like to schedule every 2 weeks, but sister (who lives with her) is having a harder time transporting her to all appointments.   CSW spoke with sister, [name], by phone and discussed options through [facility] transportation as well as [facility] [place] (prev [facility]). Sister opted for trying [facility] transportation to start. CSW sent referral for [employer] (door-to-door) services since patient has dementia and needs a walker.   CSW provided contact information if family has further needs in the future.   Edwinna Areola Dwane Andres , LCSW

## 2020-05-24 NOTE — Patient Instructions (Signed)

## 2020-05-25 ENCOUNTER — Other Ambulatory Visit: Payer: Self-pay | Admitting: Oncology

## 2020-05-25 ENCOUNTER — Telehealth: Payer: Self-pay | Admitting: Oncology

## 2020-05-25 ENCOUNTER — Other Ambulatory Visit: Payer: Self-pay | Admitting: *Deleted

## 2020-05-25 ENCOUNTER — Telehealth: Payer: Self-pay | Admitting: Internal Medicine

## 2020-05-25 ENCOUNTER — Other Ambulatory Visit: Payer: Medicare Other

## 2020-05-25 ENCOUNTER — Ambulatory Visit: Payer: Medicare Other

## 2020-05-25 DIAGNOSIS — M810 Age-related osteoporosis without current pathological fracture: Secondary | ICD-10-CM

## 2020-05-25 DIAGNOSIS — E039 Hypothyroidism, unspecified: Secondary | ICD-10-CM

## 2020-05-25 DIAGNOSIS — F0150 Vascular dementia without behavioral disturbance: Secondary | ICD-10-CM

## 2020-05-25 DIAGNOSIS — C50812 Malignant neoplasm of overlapping sites of left female breast: Secondary | ICD-10-CM

## 2020-05-25 DIAGNOSIS — I10 Essential (primary) hypertension: Secondary | ICD-10-CM

## 2020-05-25 DIAGNOSIS — F0280 Dementia in other diseases classified elsewhere without behavioral disturbance: Secondary | ICD-10-CM

## 2020-05-25 DIAGNOSIS — F0390 Unspecified dementia without behavioral disturbance: Secondary | ICD-10-CM | POA: Diagnosis not present

## 2020-05-25 DIAGNOSIS — G309 Alzheimer's disease, unspecified: Secondary | ICD-10-CM

## 2020-05-25 DIAGNOSIS — F320 Major depressive disorder, single episode, mild: Secondary | ICD-10-CM

## 2020-05-25 LAB — TYPE AND SCREEN
ABO/RH(D): A POS
Antibody Screen: NEGATIVE
Unit division: 0

## 2020-05-25 LAB — BPAM RBC
Blood Product Expiration Date: 202107022359
ISSUE DATE / TIME: 202105251345
Unit Type and Rh: 6200

## 2020-05-25 NOTE — Telephone Encounter (Signed)
Zyra Caslin DOB: March 13, 1926 MRN: [id number]   RIDER WAIVER AND RELEASE OF LIABILITY  For purposes of improving physical access to our facilities, [facility] is pleased to partner with third parties to provide [facility] patients or other authorized individuals the option of convenient, on-demand ground transportation services (the Technical brewer") through use of the technology service that enables users to request on-demand ground transportation from independent third-party providers.  By opting to use and accept these [employer], I, the undersigned, hereby agree on behalf of myself, and on behalf of any minor child using the [employer] for whom I am the parent or legal guardian, as follows:  1. Government social research officer provided to me are provided by independent third-party transportation providers who are not [employer] or employees and who are unaffiliated with [employer]. 2. [facility] is neither a transportation carrier nor a common or public carrier. 3. [facility] has no control over the quality or safety of the transportation that occurs as a result of the [employer]. 4. [facility] cannot guarantee that any third-party transportation provider will complete any arranged transportation service. 5. [facility] makes no representation, warranty, or guarantee regarding the reliability, timeliness, quality, safety, suitability, or availability of any of the Transport Services or that they will be error free. 6. I fully understand that traveling by vehicle involves risks and dangers of serious bodily injury, including permanent disability, paralysis, and death. I agree, on behalf of myself and on behalf of any minor child using the Transport Services for whom I am the parent or legal guardian, that the entire risk arising out of my use of the [employer] remains solely with me, to the maximum extent permitted under applicable law. 7. The [employer] are provided "as is" and "as available." [facility] disclaims all representations and warranties, express, implied or statutory, not expressly set out in these terms, including the implied warranties of merchantability and fitness for a particular purpose. 8. I hereby waive and release [facility], its agents, employees, officers, directors, representatives, insurers, attorneys, assigns, successors, subsidiaries, and affiliates from any and all past, present, or future claims, demands, liabilities, actions, causes of action, or suits of any kind directly or indirectly arising from acceptance and use of the [employer]. 9. I further waive and release [facility] and its affiliates from all present and future liability and responsibility for any injury or death to persons or damages to property caused by or related to the use of the [employer]. 10. I have read this Waiver and Release of Liability, and I understand the terms used in it and their legal significance. This Waiver is freely and voluntarily given with the understanding that my right (as well as the right of any minor child for whom I am the parent or legal guardian using the [employer]) to legal recourse against [facility] in connection with the [employer] is knowingly surrendered in return for use of these services.   I attest that I read the consent document to [name], gave Ms. Depace the opportunity to ask questions and answered the questions asked (if any). I affirm that [name] then provided consent for she's participation in this program.     Legrand Pitts, read to sister Bryon Lions to which she gave consent to both Ms. Maron and herself.

## 2020-05-25 NOTE — Telephone Encounter (Signed)
Scheduled appts per 5/24 los. Pt's sister confirmed appt dates and times.

## 2020-05-27 ENCOUNTER — Other Ambulatory Visit: Payer: Self-pay | Admitting: Oncology

## 2020-05-27 LAB — URINE CULTURE: Culture: 40000 — AB

## 2020-05-27 MED ORDER — CIPROFLOXACIN HCL 500 MG PO TABS
500.0000 mg | ORAL_TABLET | Freq: Two times a day (BID) | ORAL | 0 refills | Status: DC
Start: 2020-05-27 — End: 2020-08-17

## 2020-05-27 NOTE — Progress Notes (Unsigned)
Keeana received 5 days of cefazolin but she did not clear her urine.  I am adding 5 days of ciprofloxacin.

## 2020-05-31 ENCOUNTER — Other Ambulatory Visit: Payer: Self-pay

## 2020-05-31 ENCOUNTER — Inpatient Hospital Stay: Payer: Medicare Other | Attending: Oncology

## 2020-05-31 ENCOUNTER — Inpatient Hospital Stay: Payer: Medicare Other

## 2020-05-31 DIAGNOSIS — D0512 Intraductal carcinoma in situ of left breast: Secondary | ICD-10-CM

## 2020-05-31 DIAGNOSIS — C50412 Malignant neoplasm of upper-outer quadrant of left female breast: Secondary | ICD-10-CM | POA: Insufficient documentation

## 2020-05-31 LAB — CBC WITH DIFFERENTIAL/PLATELET
Abs Immature Granulocytes: 0.04 10*3/uL (ref 0.00–0.07)
Basophils Absolute: 0 10*3/uL (ref 0.0–0.1)
Basophils Relative: 1 %
Eosinophils Absolute: 0.1 10*3/uL (ref 0.0–0.5)
Eosinophils Relative: 1 %
HCT: 38.5 % (ref 36.0–46.0)
Hemoglobin: 12 g/dL (ref 12.0–15.0)
Immature Granulocytes: 1 %
Lymphocytes Relative: 15 %
Lymphs Abs: 0.9 10*3/uL (ref 0.7–4.0)
MCH: 28.8 pg (ref 26.0–34.0)
MCHC: 31.2 g/dL (ref 30.0–36.0)
MCV: 92.5 fL (ref 80.0–100.0)
Monocytes Absolute: 0.4 10*3/uL (ref 0.1–1.0)
Monocytes Relative: 6 %
Neutro Abs: 4.9 10*3/uL (ref 1.7–7.7)
Neutrophils Relative %: 76 %
Platelets: 219 10*3/uL (ref 150–400)
RBC: 4.16 MIL/uL (ref 3.87–5.11)
RDW: 15.9 % — ABNORMAL HIGH (ref 11.5–15.5)
WBC: 6.3 10*3/uL (ref 4.0–10.5)
nRBC: 0 % (ref 0.0–0.2)

## 2020-06-06 ENCOUNTER — Other Ambulatory Visit: Payer: Self-pay

## 2020-06-06 ENCOUNTER — Inpatient Hospital Stay: Payer: Medicare Other

## 2020-06-06 ENCOUNTER — Telehealth: Payer: Self-pay | Admitting: Oncology

## 2020-06-06 DIAGNOSIS — D0512 Intraductal carcinoma in situ of left breast: Secondary | ICD-10-CM

## 2020-06-06 DIAGNOSIS — C50412 Malignant neoplasm of upper-outer quadrant of left female breast: Secondary | ICD-10-CM | POA: Diagnosis not present

## 2020-06-06 LAB — CBC WITH DIFFERENTIAL/PLATELET
Abs Immature Granulocytes: 0.02 10*3/uL (ref 0.00–0.07)
Basophils Absolute: 0 10*3/uL (ref 0.0–0.1)
Basophils Relative: 0 %
Eosinophils Absolute: 0.1 10*3/uL (ref 0.0–0.5)
Eosinophils Relative: 1 %
HCT: 38.8 % (ref 36.0–46.0)
Hemoglobin: 12.1 g/dL (ref 12.0–15.0)
Immature Granulocytes: 0 %
Lymphocytes Relative: 23 %
Lymphs Abs: 1.2 10*3/uL (ref 0.7–4.0)
MCH: 29.4 pg (ref 26.0–34.0)
MCHC: 31.2 g/dL (ref 30.0–36.0)
MCV: 94.4 fL (ref 80.0–100.0)
Monocytes Absolute: 0.4 10*3/uL (ref 0.1–1.0)
Monocytes Relative: 8 %
Neutro Abs: 3.4 10*3/uL (ref 1.7–7.7)
Neutrophils Relative %: 68 %
Platelets: 208 10*3/uL (ref 150–400)
RBC: 4.11 MIL/uL (ref 3.87–5.11)
RDW: 15.9 % — ABNORMAL HIGH (ref 11.5–15.5)
WBC: 5.1 10*3/uL (ref 4.0–10.5)
nRBC: 0 % (ref 0.0–0.2)

## 2020-06-06 NOTE — Telephone Encounter (Signed)
Cancelled appts per 6/7 secure chat msg from LPN P[place] to cancel appts. Called pt's sister to go over changes made to pt's schedule. Pt's sister confirmed appt dates and times.

## 2020-06-06 NOTE — Progress Notes (Signed)
Patients sister wanted to know why the patient had another appointment on 6/9 for injection. After reviewing the chart I was not able to answer that so I made Mickel Baas, LPN aware that she needed to confirm with MD and call the patients sister back today if possible. Made the family member aware as well.

## 2020-06-06 NOTE — Progress Notes (Signed)
Due to parameters patient will not receive injection. Gave a copy of labs and made aware.

## 2020-06-08 ENCOUNTER — Other Ambulatory Visit: Payer: Medicare Other

## 2020-06-08 ENCOUNTER — Ambulatory Visit: Payer: Medicare Other

## 2020-06-08 ENCOUNTER — Other Ambulatory Visit: Payer: Self-pay | Admitting: Internal Medicine

## 2020-06-20 ENCOUNTER — Ambulatory Visit: Payer: Medicare Other

## 2020-06-20 ENCOUNTER — Other Ambulatory Visit: Payer: Medicare Other

## 2020-06-21 MED FILL — EXEMESTANE 25 MG TABLET: 25 | 90 days supply | Qty: 90 | Fill #2

## 2020-06-22 ENCOUNTER — Inpatient Hospital Stay: Payer: Medicare Other

## 2020-06-22 ENCOUNTER — Other Ambulatory Visit: Payer: Self-pay

## 2020-06-22 DIAGNOSIS — D0512 Intraductal carcinoma in situ of left breast: Secondary | ICD-10-CM

## 2020-06-22 DIAGNOSIS — C50412 Malignant neoplasm of upper-outer quadrant of left female breast: Secondary | ICD-10-CM | POA: Diagnosis not present

## 2020-06-22 LAB — CBC WITH DIFFERENTIAL/PLATELET
Abs Immature Granulocytes: 0.03 10*3/uL (ref 0.00–0.07)
Basophils Absolute: 0 10*3/uL (ref 0.0–0.1)
Basophils Relative: 1 %
Eosinophils Absolute: 0.1 10*3/uL (ref 0.0–0.5)
Eosinophils Relative: 2 %
HCT: 37.5 % (ref 36.0–46.0)
Hemoglobin: 11.7 g/dL — ABNORMAL LOW (ref 12.0–15.0)
Immature Granulocytes: 1 %
Lymphocytes Relative: 29 %
Lymphs Abs: 1.4 10*3/uL (ref 0.7–4.0)
MCH: 29.3 pg (ref 26.0–34.0)
MCHC: 31.2 g/dL (ref 30.0–36.0)
MCV: 94 fL (ref 80.0–100.0)
Monocytes Absolute: 0.4 10*3/uL (ref 0.1–1.0)
Monocytes Relative: 9 %
Neutro Abs: 3 10*3/uL (ref 1.7–7.7)
Neutrophils Relative %: 58 %
Platelets: 176 10*3/uL (ref 150–400)
RBC: 3.99 MIL/uL (ref 3.87–5.11)
RDW: 16 % — ABNORMAL HIGH (ref 11.5–15.5)
WBC: 5 10*3/uL (ref 4.0–10.5)
nRBC: 0 % (ref 0.0–0.2)

## 2020-06-22 NOTE — Patient Instructions (Signed)

## 2020-06-22 NOTE — Progress Notes (Signed)
Patient did not receive injection today due to parameters  HGB 11.7

## 2020-06-23 ENCOUNTER — Telehealth: Payer: Self-pay

## 2020-06-23 NOTE — Telephone Encounter (Signed)
What are the blood pressure readings that are elevated?

## 2020-06-23 NOTE — Telephone Encounter (Signed)
Physical Therapist for patient called to let Dr. Mariea Clonts know the the patient has had and elevated Blood Pressure and has been complaining about pain in her left breast and left are the breast has been removed and the patient would be seeing her breast surgeon on Monday

## 2020-06-24 DIAGNOSIS — G309 Alzheimer's disease, unspecified: Secondary | ICD-10-CM | POA: Diagnosis not present

## 2020-06-24 DIAGNOSIS — M810 Age-related osteoporosis without current pathological fracture: Secondary | ICD-10-CM | POA: Diagnosis not present

## 2020-06-24 DIAGNOSIS — F0280 Dementia in other diseases classified elsewhere without behavioral disturbance: Secondary | ICD-10-CM | POA: Diagnosis not present

## 2020-06-24 DIAGNOSIS — F320 Major depressive disorder, single episode, mild: Secondary | ICD-10-CM | POA: Diagnosis not present

## 2020-06-24 DIAGNOSIS — I10 Essential (primary) hypertension: Secondary | ICD-10-CM | POA: Diagnosis not present

## 2020-06-24 DIAGNOSIS — E039 Hypothyroidism, unspecified: Secondary | ICD-10-CM | POA: Diagnosis not present

## 2020-06-24 DIAGNOSIS — Z170 Estrogen receptor positive status [ER+]: Secondary | ICD-10-CM | POA: Diagnosis not present

## 2020-06-24 DIAGNOSIS — F0150 Vascular dementia without behavioral disturbance: Secondary | ICD-10-CM | POA: Diagnosis not present

## 2020-06-24 DIAGNOSIS — C50812 Malignant neoplasm of overlapping sites of left female breast: Secondary | ICD-10-CM | POA: Diagnosis not present

## 2020-06-27 NOTE — Telephone Encounter (Signed)
If blood pressure remains elevated to notify us so we can schedule follow up appt

## 2020-06-27 NOTE — Telephone Encounter (Signed)
BP was 178/80

## 2020-06-27 NOTE — Telephone Encounter (Signed)
I called and spoke with patients sister and she said that patients has been having a lot of blood work and appointments as of late and she has an appointment with her breast surgeon today  and that if her blood pressure is elevated she will call the office to inform us and to see what we suggest.

## 2020-06-29 ENCOUNTER — Other Ambulatory Visit: Payer: Self-pay | Admitting: Internal Medicine

## 2020-06-29 DIAGNOSIS — F320 Major depressive disorder, single episode, mild: Secondary | ICD-10-CM | POA: Diagnosis not present

## 2020-06-29 DIAGNOSIS — G309 Alzheimer's disease, unspecified: Secondary | ICD-10-CM | POA: Diagnosis not present

## 2020-06-29 DIAGNOSIS — F0150 Vascular dementia without behavioral disturbance: Secondary | ICD-10-CM | POA: Diagnosis not present

## 2020-06-29 DIAGNOSIS — I10 Essential (primary) hypertension: Secondary | ICD-10-CM | POA: Diagnosis not present

## 2020-06-29 DIAGNOSIS — C50812 Malignant neoplasm of overlapping sites of left female breast: Secondary | ICD-10-CM | POA: Diagnosis not present

## 2020-06-29 DIAGNOSIS — F0280 Dementia in other diseases classified elsewhere without behavioral disturbance: Secondary | ICD-10-CM | POA: Diagnosis not present

## 2020-06-30 NOTE — Telephone Encounter (Signed)
Patient has not had recent fasting lipids. Per Dr. Mariea Clonts, "Lipids due but not appropriate to check in 86 yo with dementia and breast cancer."   She has an appointment scheduled with Dr. Mariea Clonts on 09/22/20.

## 2020-07-01 DIAGNOSIS — I10 Essential (primary) hypertension: Secondary | ICD-10-CM | POA: Diagnosis not present

## 2020-07-01 DIAGNOSIS — F0280 Dementia in other diseases classified elsewhere without behavioral disturbance: Secondary | ICD-10-CM | POA: Diagnosis not present

## 2020-07-01 DIAGNOSIS — F0150 Vascular dementia without behavioral disturbance: Secondary | ICD-10-CM | POA: Diagnosis not present

## 2020-07-01 DIAGNOSIS — G309 Alzheimer's disease, unspecified: Secondary | ICD-10-CM | POA: Diagnosis not present

## 2020-07-01 DIAGNOSIS — F320 Major depressive disorder, single episode, mild: Secondary | ICD-10-CM | POA: Diagnosis not present

## 2020-07-01 DIAGNOSIS — C50812 Malignant neoplasm of overlapping sites of left female breast: Secondary | ICD-10-CM | POA: Diagnosis not present

## 2020-07-05 ENCOUNTER — Ambulatory Visit: Payer: Medicare Other

## 2020-07-05 ENCOUNTER — Other Ambulatory Visit: Payer: Medicare Other

## 2020-07-06 ENCOUNTER — Other Ambulatory Visit: Payer: Medicare Other

## 2020-07-06 ENCOUNTER — Ambulatory Visit: Payer: Medicare Other

## 2020-07-07 ENCOUNTER — Inpatient Hospital Stay: Payer: Medicare Other

## 2020-07-07 ENCOUNTER — Inpatient Hospital Stay (HOSPITAL_BASED_OUTPATIENT_CLINIC_OR_DEPARTMENT_OTHER): Payer: Medicare Other | Admitting: Oncology

## 2020-07-07 ENCOUNTER — Other Ambulatory Visit: Payer: Self-pay

## 2020-07-07 ENCOUNTER — Inpatient Hospital Stay: Payer: Medicare Other | Attending: Oncology

## 2020-07-07 VITALS — BP 150/73 | HR 77 | Temp 98.5°F | Resp 18 | Ht 61.0 in | Wt 153.4 lb

## 2020-07-07 DIAGNOSIS — D0512 Intraductal carcinoma in situ of left breast: Secondary | ICD-10-CM | POA: Diagnosis not present

## 2020-07-07 DIAGNOSIS — Z170 Estrogen receptor positive status [ER+]: Secondary | ICD-10-CM

## 2020-07-07 DIAGNOSIS — C50812 Malignant neoplasm of overlapping sites of left female breast: Secondary | ICD-10-CM | POA: Diagnosis not present

## 2020-07-07 DIAGNOSIS — I6350 Cerebral infarction due to unspecified occlusion or stenosis of unspecified cerebral artery: Secondary | ICD-10-CM

## 2020-07-07 DIAGNOSIS — C50412 Malignant neoplasm of upper-outer quadrant of left female breast: Secondary | ICD-10-CM | POA: Diagnosis not present

## 2020-07-07 DIAGNOSIS — Z79811 Long term (current) use of aromatase inhibitors: Secondary | ICD-10-CM | POA: Diagnosis not present

## 2020-07-07 LAB — CBC WITH DIFFERENTIAL/PLATELET
Abs Immature Granulocytes: 0.02 10*3/uL (ref 0.00–0.07)
Basophils Absolute: 0 10*3/uL (ref 0.0–0.1)
Basophils Relative: 1 %
Eosinophils Absolute: 0.1 10*3/uL (ref 0.0–0.5)
Eosinophils Relative: 2 %
HCT: 36.3 % (ref 36.0–46.0)
Hemoglobin: 11.4 g/dL — ABNORMAL LOW (ref 12.0–15.0)
Immature Granulocytes: 0 %
Lymphocytes Relative: 27 %
Lymphs Abs: 1.3 10*3/uL (ref 0.7–4.0)
MCH: 29.7 pg (ref 26.0–34.0)
MCHC: 31.4 g/dL (ref 30.0–36.0)
MCV: 94.5 fL (ref 80.0–100.0)
Monocytes Absolute: 0.4 10*3/uL (ref 0.1–1.0)
Monocytes Relative: 8 %
Neutro Abs: 3 10*3/uL (ref 1.7–7.7)
Neutrophils Relative %: 62 %
Platelets: 189 10*3/uL (ref 150–400)
RBC: 3.84 MIL/uL — ABNORMAL LOW (ref 3.87–5.11)
RDW: 16 % — ABNORMAL HIGH (ref 11.5–15.5)
WBC: 4.8 10*3/uL (ref 4.0–10.5)
nRBC: 0 % (ref 0.0–0.2)

## 2020-07-07 NOTE — Progress Notes (Signed)
[facility]  [phone] [phone]    ID: Shelia Marks DOB: 1926/12/22  MR#: [id number]  LTJ#:[id number]  Patient Care Team: Gayland Curry, DO as PCP - General (Geriatric Medicine) Rutherford Guys, MD as Consulting Physician (Ophthalmology) Drako Maese, Virgie Dad, MD as Consulting Physician (Oncology) Jovita Kussmaul, MD as Consulting Physician (General Surgery) Jacelyn Pi, MD as Consulting Physician (Endocrinology) Eppie Gibson, MD as Attending Physician (Radiation Oncology) OTHER MD:   CHIEF COMPLAINT: Estrogen receptor positive breast cancer (s/p left mastectomy)  CURRENT TREATMENT: exemestane   INTERVAL HISTORY: Shelia Marks returns today for follow-up of her estrogen receptor positive breast cancer. She is accompanied by her sister, Shelia Marks.  She continues on exemestane.  She is tolerating this with no side effects that she is aware of.  In particular she is not having any hot flashes, which is the most common problem.  Since her last visit, she has not undergone any additional studies. She will be due for repeat mammography, which she receives at [facility], at the end of this month.  She did see Dr Marlou Starks recently and though I do not yet have a copy of his note the patient's sister tells me "the surgeon found more spots." on [name]'s chest wall   REVIEW OF SYSTEMS: Shelia Marks is hard of hearing but strains to participatein the conversation. She tells me she does not do much at home;; "eating and sleeping." She denies pain, but c/o some itching on her chest. A detailed ROS today was otherwise stable   BREAST CANCER HISTORY: From the original intake note:  "Shelia Marks" had bilateral screening mammography at the [facility] 06/07/2016 showing calcifications in the left breast. She was recalled  05/15/2016 for left diagnostic mammography. This showed the breast density to be category be. In the upper outer left breast there was an  area of suspicious calcifications measuring 1.7 cm.  Biopsy of this area was obtained 05/21/2016, and showed (S [phone]) ductal carcinoma in situ, high-grade, estrogen receptor 100% positive, progesterone receptor 5% positive, WITH strong staining intensity.  Her subsequent history is as detailed below.   PAST MEDICAL HISTORY: Past Medical History:  Diagnosis Date   Alzheimer's disease ([place])    Cancer ([place])    Disorder of bone and cartilage, unspecified    Dizziness and giddiness    Hyperlipidemia LDL goal < 100    Hypertension    Hypothyroidism    Malignant neoplasm of thyroid gland (HCC)    Nontoxic uninodular goiter    Other malaise and fatigue    Other symptoms involving cardiovascular system    Phlebitis and thrombophlebitis of unspecified site    Senile osteoporosis    Sleep related leg cramps    Stroke ([place])    Unspecified disorder of lipoid metabolism    Unspecified hereditary and idiopathic peripheral neuropathy    Varicose veins of lower extremities with inflammation     PAST SURGICAL HISTORY: Past Surgical History:  Procedure Laterality Date   ABDOMINAL HYSTERECTOMY     DR HUGES   BREAST BIOPSY Left 05/21/2016   malignant   CATRACT SURGERY  2008   COLON SURGERY     clipped polyp during colonoscopy   EXCISION OF KELOID Left 02/19/2020   Procedure: EXCISION NODULE LEFT CHEST WALL;  Surgeon: Jovita Kussmaul, MD;  Location: [facility];  Service: General;  Laterality: Left;   KNEE SURGERY     right   MASTECTOMY MODIFIED RADICAL Left 09/10/2019   Procedure: LEFT  MASTECTOMY MODIFIED RADICAL;  Surgeon: Jovita Kussmaul, MD;  Location: [facility] OR;  Service: General;  Laterality: Left;   [place]   THYROID SURGERY  July 20, 2011    FAMILY HISTORY Family History  Problem Relation Age of Onset   Diabetes Brother    The patient's father died from liver problems in his 33s. The patient's mother died in her  74s from "natural causes". The patient had no brothers. She had 6 sisters, 2 of whom died from complications of diabetes and one from a ruptured aneurysm. There is no history of breast or ovarian cancer in the family.    GYNECOLOGIC HISTORY:  No LMP recorded. Patient has had a hysterectomy.  Shelia Marks does not remember how she was when she had her first period. She never carried a child to term. She is status post hysterectomy and at least one ovary was removed. She never took hormone replacement.    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  She is widowed and lives with her sister.   ADVANCED DIRECTIVES: [name], Shelia Marks, is her healthcare power of attorney. Shelia Marks can be reached at [phone].   HEALTH MAINTENANCE: Social History   Tobacco Use   Smoking status: Never Smoker   Smokeless tobacco: Never Used  Scientific laboratory technician Use: Never used  Substance Use Topics   Alcohol use: No   Drug use: No     No Known Allergies  Current Outpatient Medications  Medication Sig Dispense Refill   acetaminophen (TYLENOL) 325 MG tablet Take 2 tablets (650 mg total) by mouth every 6 (six) hours as needed for mild pain, moderate pain, fever or headache.     alendronate (FOSAMAX) 70 MG tablet TAKE 1 TABLET (70 MG TOTAL) EVERY 7 (SEVEN) DAYS. TAKE WITH A FULL GLASS OF WATER ON AN EMPTY STOMACH. (Patient taking differently: Take 70 mg by mouth every Monday. ) 12 tablet 1   allopurinol (ZYLOPRIM) 300 MG tablet TAKE 1 TABLET EVERY DAY (Patient taking differently: Take 300 mg by mouth daily. ) 90 tablet 1   amLODipine (NORVASC) 10 MG tablet TAKE 1 TABLET EVERY DAY 90 tablet 1   aspirin EC 81 MG tablet Take 1 tablet (81 mg total) by mouth daily. 30 tablet 3   atenolol (TENORMIN) 25 MG tablet TAKE 1 TABLET EVERY DAY (Patient taking differently: Take 25 mg by mouth daily. ) 90 tablet 1   atorvastatin (LIPITOR) 40 MG tablet Take 1  tablet (40 mg total) by mouth daily. 90 tablet 0   cephALEXin (KEFLEX) 500 MG capsule Take 1 capsule (500 mg total) by mouth 4 (four) times daily. Or until you get the nasal packing removed 40 capsule 0   ciprofloxacin (CIPRO) 500 MG tablet Take 1 tablet (500 mg total) by mouth 2 (two) times daily. 14 tablet 0   exemestane (AROMASIN) 25 MG tablet Take 1 tablet (25 mg total) by mouth daily after breakfast. 90 tablet 3   HYDROcodone-acetaminophen (NORCO/VICODIN) 5-325 MG tablet Take 0.5-1 tablets by mouth every 6 (six) hours as needed for severe pain. 10 tablet 0   levothyroxine (SYNTHROID) 112 MCG tablet TAKE 1 TABLET BY MOUTH EVERY DAY (Patient taking differently: Take 112 mcg by mouth daily before breakfast. ) 90 tablet 3   lisinopril (ZESTRIL) 2.5 MG tablet TAKE 1 TABLET DAILY TO CONTROL BLOOD PRESSURE 90 tablet 1   magnesium hydroxide (MILK OF MAGNESIA)  400 MG/5ML suspension Take 5-10 mLs by mouth daily as needed for mild constipation.     sertraline (ZOLOFT) 50 MG tablet TAKE ONE TABLET DAILY FOR DEPRESSION (Patient taking differently: Take 50 mg by mouth daily. ) 90 tablet 1   No current facility-administered medications for this visit.    OBJECTIVE: African-American woman examined in a wheelchair Vitals:   07/07/20 1428  BP: (!) 150/73  Pulse: 77  Resp: 18  Temp: 98.5 F (36.9 C)  SpO2: 100%     Body mass index is 28.98 kg/m.    ECOG FS:2 - Symptomatic, <50% confined to bed  Sclerae unicteric, bilateral arcus senilis Wearing a mask No cervical or supraclavicular adenopathy Lungs no rales or rhonchi Heart regular rate and rhythm Abd soft, nontender, positive bowel sounds MSK no focal spinal tenderness Neuro: nonfocal, guarded affect Breasts: the right brest is unremarkable. The left breast is s/p mastectomy. The scar is irregular but even so there are some flat non erythematous non tender subcutaneous nodules c/w locally recurrent disease    LAB RESULTS:  CMP       Component Value Date/Time   NA 140 05/23/2020 1454   NA 143 06/01/2016 0958   K 3.5 05/23/2020 1454   CL 108 05/23/2020 1454   CO2 24 05/23/2020 1454   GLUCOSE 84 05/23/2020 1454   BUN 16 05/23/2020 1454   BUN 21 06/01/2016 0958   CREATININE 1.00 05/23/2020 1454   CREATININE 1.13 (H) 02/04/2020 1019   CREATININE 0.90 (H) 01/18/2020 1125   CALCIUM 9.3 05/23/2020 1454   PROT 7.0 05/23/2020 1454   PROT 6.8 11/09/2015 1013   ALBUMIN 3.2 (L) 05/23/2020 1454   ALBUMIN 4.1 11/09/2015 1013   AST 25 05/23/2020 1454   AST 29 02/04/2020 1019   ALT 20 05/23/2020 1454   ALT 27 02/04/2020 1019   ALKPHOS 74 05/23/2020 1454   BILITOT 0.6 05/23/2020 1454   BILITOT 0.6 02/04/2020 1019   GFRNONAA 49 (L) 05/23/2020 1454   GFRNONAA 42 (L) 02/04/2020 1019   GFRNONAA 51 (L) 02/25/2018 0937   GFRAA 57 (L) 05/23/2020 1454   GFRAA 49 (L) 02/04/2020 1019   GFRAA 59 (L) 02/25/2018 0937    INo results found for: SPEP, UPEP  Lab Results  Component Value Date   WBC 4.8 07/07/2020   NEUTROABS 3.0 07/07/2020   HGB 11.4 (L) 07/07/2020   HCT 36.3 07/07/2020   MCV 94.5 07/07/2020   PLT 189 07/07/2020      Chemistry      Component Value Date/Time   NA 140 05/23/2020 1454   NA 143 06/01/2016 0958   K 3.5 05/23/2020 1454   CL 108 05/23/2020 1454   CO2 24 05/23/2020 1454   BUN 16 05/23/2020 1454   BUN 21 06/01/2016 0958   CREATININE 1.00 05/23/2020 1454   CREATININE 1.13 (H) 02/04/2020 1019   CREATININE 0.90 (H) 01/18/2020 1125      Component Value Date/Time   CALCIUM 9.3 05/23/2020 1454   ALKPHOS 74 05/23/2020 1454   AST 25 05/23/2020 1454   AST 29 02/04/2020 1019   ALT 20 05/23/2020 1454   ALT 27 02/04/2020 1019   BILITOT 0.6 05/23/2020 1454   BILITOT 0.6 02/04/2020 1019       No results found for: LABCA2  No components found for: LABCA125  No results for input(s): INR in the last 168 hours.  Urinalysis    Component Value Date/Time   COLORURINE YELLOW 05/23/2020 1454  APPEARANCEUR CLEAR 05/23/2020 1454   LABSPEC 1.016 05/23/2020 1454   PHURINE 6.0 05/23/2020 1454   GLUCOSEU NEGATIVE 05/23/2020 1454   [place] 05/23/2020 [place] 05/23/2020 [place] 05/23/2020 1454   PROTEINUR NEGATIVE 05/23/2020 1454   UROBILINOGEN 0.2 03/05/2014 2055   NITRITE NEGATIVE 05/23/2020 1454   LEUKOCYTESUR SMALL (A) 05/23/2020 1454    STUDIES: No results found.   ELIGIBLE FOR AVAILABLE RESEARCH PROTOCOL: no   ASSESSMENT: 86 y.o. [place] woman status post left breast upper outer quadrant biopsy 05/21/2016 for ductal carcinoma in situ, high-grade, estrogen and progesterone receptor positive  (1) opted against surgery   (2) started anastrozole neoadjuvantly 06/13/2016, discontinued June 2018 due to osteoporosis concerns  (a)  bone density 11/16/2016 shows a T score of -4.7 (osteoporosis)-on fosamax.   (3) mammography/ultrasonography 06/10/2018 shows evidence of disease progression, possible lymph node extension  (4) letrozole started 06/27/2018   (a) discontinued November 2019 with evidence of progression  (b) restarted 09/2019, to change to Exemestane in 12/2019  (5) fulvestrant started 11/21/2018, discontinued 03/12/2019  (a) mammography and ultrasonography 07/24/2019 shows evidence of disease progression  (6) Left modified radical mastectomy on 09/10/2019: showing a stage IIIA, T2, N2a, invasive ductal carcinoma, grade 3, margins negative, estrogen receptor positive, progesterone receptor and HER-2 negative, with an MIB-1 of 90%  (a) 4 out of 14 lymph nodes positive for macrometastases  (b) 1 out of 14 lymph nodes positive for micrometastases  (c) CT chest on 08/26/2019 negative for metastatic disease  (7) Adjuvant radiation recommended on 09/24/2019 and again on10/09/2019, declined by patient  (8) removal of a left chest wall lesion 02/19/2020 showing metastatic breast cancer, estrogen receptor 95% and strongly  positive, progesterone receptor negative, HER-2 negative with an MIB-1 of 50%  (a) patient and sister again opted against adjuvant radiation at 03/07/2020 visit  (b) additional lesions noted clincially 07/07/2020  (9) exemestane started December 2020     PLAN: Shelia Marks's anemia is fairly resolved and she does not need EPO today. We will continue that on an as needed basis (whenever her Hb drops below 10.0).  We discussed the fact that clinically her breast cancer is back in her chest wall. This can become uncontrollable. The exemestane is not working at this point and I do not have a simple alternative though we can consider fulvestrant.  [name] is stretched thin. She needs to drive the patient to all visits and of course cares for her at home. Today I suggested we caould hep with transportation and that seemed to remove a major stumbling block to the idea of radiation treatments.  Accordingly I am referring her to radiation oncology for palliative radiation to the left chest wall area. At the next visit we will stop exemestane and start monthly fulvestrant. [name] Shelia Marks is agreeable to this plan.  Total encounter time 40 minutes.Sarajane Jews C. Haleigh Desmith, MD 07/07/20 6:43 PM Medical Oncology and Hematology [facility] [place], [place] [address] Tel. [phone]    Fax. [phone]   I, Wilburn Mylar, am acting as scribe for Dr. Virgie Dad. Crispin Vogel.  I, Lurline Del MD, have reviewed the above documentation for accuracy and completeness, and I agree with the above.   *Total Encounter Time as defined by the Centers for Medicare and Medicaid Services includes, in addition to the face-to-face time of a patient visit (documented in the note above) non-face-to-face time: obtaining and reviewing outside  history, ordering and reviewing medications, tests or procedures, care coordination (communications with other health care  professionals or caregivers) and documentation in the medical record.

## 2020-07-08 DIAGNOSIS — I10 Essential (primary) hypertension: Secondary | ICD-10-CM | POA: Diagnosis not present

## 2020-07-08 DIAGNOSIS — F0280 Dementia in other diseases classified elsewhere without behavioral disturbance: Secondary | ICD-10-CM | POA: Diagnosis not present

## 2020-07-08 DIAGNOSIS — C50812 Malignant neoplasm of overlapping sites of left female breast: Secondary | ICD-10-CM | POA: Diagnosis not present

## 2020-07-08 DIAGNOSIS — G309 Alzheimer's disease, unspecified: Secondary | ICD-10-CM | POA: Diagnosis not present

## 2020-07-08 DIAGNOSIS — F0150 Vascular dementia without behavioral disturbance: Secondary | ICD-10-CM | POA: Diagnosis not present

## 2020-07-08 DIAGNOSIS — F320 Major depressive disorder, single episode, mild: Secondary | ICD-10-CM | POA: Diagnosis not present

## 2020-07-12 DIAGNOSIS — G309 Alzheimer's disease, unspecified: Secondary | ICD-10-CM | POA: Diagnosis not present

## 2020-07-12 DIAGNOSIS — C50812 Malignant neoplasm of overlapping sites of left female breast: Secondary | ICD-10-CM | POA: Diagnosis not present

## 2020-07-12 DIAGNOSIS — I10 Essential (primary) hypertension: Secondary | ICD-10-CM | POA: Diagnosis not present

## 2020-07-12 DIAGNOSIS — F0150 Vascular dementia without behavioral disturbance: Secondary | ICD-10-CM | POA: Diagnosis not present

## 2020-07-12 DIAGNOSIS — F0280 Dementia in other diseases classified elsewhere without behavioral disturbance: Secondary | ICD-10-CM | POA: Diagnosis not present

## 2020-07-12 DIAGNOSIS — F320 Major depressive disorder, single episode, mild: Secondary | ICD-10-CM | POA: Diagnosis not present

## 2020-07-18 ENCOUNTER — Other Ambulatory Visit: Payer: Self-pay

## 2020-07-18 ENCOUNTER — Encounter: Payer: Self-pay | Admitting: Radiation Oncology

## 2020-07-18 ENCOUNTER — Telehealth: Payer: Self-pay | Admitting: *Deleted

## 2020-07-18 ENCOUNTER — Other Ambulatory Visit: Payer: Medicare Other

## 2020-07-18 ENCOUNTER — Telehealth: Payer: Self-pay

## 2020-07-18 ENCOUNTER — Ambulatory Visit: Payer: Medicare Other

## 2020-07-18 NOTE — Telephone Encounter (Signed)
This RN spoke with caregiver- Bryon Lions- called stating concern regarding " getting the radiation".  [name] wants to do whatever is medically best for the patient but " having to get her dressed everyday and get her transported everyday may be more then I can do"  [name] states she has personal health issues as well and above would be a strain on her too.  [name] is asking if pt could be admitted to a SNF while getting radiation.  This RN informed her above inquiry would be given to [facility] provider for review and can be discussed further on phone visit.  No further needs- [name] stated appreciation.

## 2020-07-18 NOTE — Telephone Encounter (Signed)
Spoke with patient sister Bryon Lions in regards to telephone visit with Shona Simpson PA on 07/19/20 at 10:30am. Meaningful use questions were reviewed. Sister verbalized understanding of appointment date and time. TM

## 2020-07-19 ENCOUNTER — Ambulatory Visit
Admission: RE | Admit: 2020-07-19 | Discharge: 2020-07-19 | Disposition: A | Discharge status: Home / Self Care | Payer: Medicare Other | Source: Ambulatory Visit | Attending: Radiation Oncology | Admitting: Radiation Oncology

## 2020-07-19 DIAGNOSIS — C50812 Malignant neoplasm of overlapping sites of left female breast: Secondary | ICD-10-CM | POA: Diagnosis not present

## 2020-07-19 DIAGNOSIS — Z170 Estrogen receptor positive status [ER+]: Secondary | ICD-10-CM | POA: Diagnosis not present

## 2020-07-19 NOTE — Progress Notes (Addendum)
Radiation Oncology         [phone] ________________________________  Outpatient Follow Up - Conducted via telephone due to current COVID-19 concerns for limiting patient exposure  I spoke with the patient to conduct this consult visit via telephone to spare the patient unnecessary potential exposure in the healthcare setting during the current COVID-19 pandemic. The patient was notified in advance and was offered a [place] meeting to allow for face to face communication but unfortunately reported that they did not have the appropriate resources/technology to support such a visit and instead preferred to proceed with a telephone visit. __________________  Name: Shelia Marks        MRN: [id number]  Date of Service: 07/19/2020 DOB: 12-Nov-1926  SW:NIOE, Tiffany L, DO  Magrinat, Virgie Dad, MD     REFERRING PHYSICIAN: Magrinat, Virgie Dad, MD   DIAGNOSIS: The encounter diagnosis was Malignant neoplasm of overlapping sites of left breast in female, estrogen receptor positive ([place]).   HISTORY OF PRESENT ILLNESS: Shelia Marks is a 86 y.o. female with a history of high-grade ER PR positive DCIS since May 2017.  She took anastrozole rather than surgery at the time, and was followed closely.  In June 2019 there was concern for disease progression with possible lymph node extension and her anastrozole was switched to letrozole.  There was evidence again on imaging in November 2019 with progressive change and was [place] switched to exemestane.  Of note fulvestrant had also been utilized since November 2019. Again showed her disease progression, and she subsequently underwent left radical mastectomy and lymph node dissection on 9/10/2020Mammography in July 2020 which revealed multifocal invasive ductal carcinoma the largest measuring 4.2 cm, her margins were negative and there was associated DCIS.  5 out of the 14 lymph nodes sampled were positive for disease. In September 2020 she was offered adjuvant  radiotherapy due to the fact that she had progressed on multiple antiestrogen agents and had a chance to consider her options though declined radiotherapy.  Unfortunately since our last conversation, the patient developed further progression with a recurrence in her left chest wall, she underwent surgical resection on 02/19/2020 which revealed metastatic carcinoma, and has been followed by Dr. Jana Hakim.  She declined further discussion for radiotherapy in March 2021 with Dr. Jana Hakim.  She remains on exemestane.  During her last evaluation on 07/07/2020, it appeared that she had subcutaneous nodules along her mastectomy scar line.  She is contacted today to discuss options of palliative radiotherapy to the chest wall.    PREVIOUS RADIATION THERAPY: No   PAST MEDICAL HISTORY:  Past Medical History:  Diagnosis Date  . Alzheimer's disease ([place])   . Cancer ([place])   . Disorder of bone and cartilage, unspecified   . Dizziness and giddiness   . Hyperlipidemia LDL goal < 100   . Hypertension   . Hypothyroidism   . Malignant neoplasm of thyroid gland ([place])   . Nontoxic uninodular goiter   . Other malaise and fatigue   . Other symptoms involving cardiovascular system   . Phlebitis and thrombophlebitis of unspecified site   . Senile osteoporosis   . Sleep related leg cramps   . Stroke ([place])   . Unspecified disorder of lipoid metabolism   . Unspecified hereditary and idiopathic peripheral neuropathy   . Varicose veins of lower extremities with inflammation        PAST SURGICAL HISTORY: Past Surgical History:  Procedure Laterality Date  . ABDOMINAL HYSTERECTOMY     DR HUGES  .  BREAST BIOPSY Left 05/21/2016   malignant  . CATRACT SURGERY  2008  . COLON SURGERY     clipped polyp during colonoscopy  . EXCISION OF KELOID Left 02/19/2020   Procedure: EXCISION NODULE LEFT CHEST WALL;  Surgeon: Jovita Kussmaul, MD;  Location: [facility];  Service: General;  Laterality: Left;  . KNEE  SURGERY     right  . MASTECTOMY MODIFIED RADICAL Left 09/10/2019   Procedure: LEFT MASTECTOMY MODIFIED RADICAL;  Surgeon: Jovita Kussmaul, MD;  Location: [facility];  Service: General;  Laterality: Left;  . PARATHYROID ADENOMA REMOVED  1983  . THYROID SURGERY  July 20, 2011     FAMILY HISTORY:  Family History  Problem Relation Age of Onset  . Diabetes Brother      SOCIAL HISTORY:  reports that she has never smoked. She has never used smokeless tobacco. She reports that she does not drink alcohol and does not use drugs. The patient is widowed and lives in [place] with her sister. She does not cook or do any of her own cleaning or laundry.   ALLERGIES: Patient has no known allergies.   MEDICATIONS:  Current Outpatient Medications  Medication Sig Dispense Refill  . acetaminophen (TYLENOL) 325 MG tablet Take 2 tablets (650 mg total) by mouth every 6 (six) hours as needed for mild pain, moderate pain, fever or headache.    . alendronate (FOSAMAX) 70 MG tablet TAKE 1 TABLET (70 MG TOTAL) EVERY 7 (SEVEN) DAYS. TAKE WITH A FULL GLASS OF WATER ON AN EMPTY STOMACH. (Patient taking differently: Take 70 mg by mouth every Monday. ) 12 tablet 1  . allopurinol (ZYLOPRIM) 300 MG tablet TAKE 1 TABLET EVERY DAY (Patient taking differently: Take 300 mg by mouth daily. ) 90 tablet 1  . amLODipine (NORVASC) 10 MG tablet TAKE 1 TABLET EVERY DAY 90 tablet 1  . aspirin EC 81 MG tablet Take 1 tablet (81 mg total) by mouth daily. 30 tablet 3  . atenolol (TENORMIN) 25 MG tablet TAKE 1 TABLET EVERY DAY (Patient taking differently: Take 25 mg by mouth daily. ) 90 tablet 1  . atorvastatin (LIPITOR) 40 MG tablet Take 1 tablet (40 mg total) by mouth daily. 90 tablet 0  . cephALEXin (KEFLEX) 500 MG capsule Take 1 capsule (500 mg total) by mouth 4 (four) times daily. Or until you get the nasal packing removed 40 capsule 0  . ciprofloxacin (CIPRO) 500 MG tablet Take 1 tablet (500 mg total) by mouth 2 (two) times daily. 14  tablet 0  . exemestane (AROMASIN) 25 MG tablet Take 1 tablet (25 mg total) by mouth daily after breakfast. 90 tablet 3  . HYDROcodone-acetaminophen (NORCO/VICODIN) 5-325 MG tablet Take 0.5-1 tablets by mouth every 6 (six) hours as needed for severe pain. 10 tablet 0  . levothyroxine (SYNTHROID) 112 MCG tablet TAKE 1 TABLET BY MOUTH EVERY DAY (Patient taking differently: Take 112 mcg by mouth daily before breakfast. ) 90 tablet 3  . lisinopril (ZESTRIL) 2.5 MG tablet TAKE 1 TABLET DAILY TO CONTROL BLOOD PRESSURE 90 tablet 1  . magnesium hydroxide (MILK OF MAGNESIA) 400 MG/5ML suspension Take 5-10 mLs by mouth daily as needed for mild constipation.    . sertraline (ZOLOFT) 50 MG tablet TAKE ONE TABLET DAILY FOR DEPRESSION (Patient taking differently: Take 50 mg by mouth daily. ) 90 tablet 1   No current facility-administered medications for this encounter.     REVIEW OF SYSTEMS: The patient reports she is  having some pain in the chest wall. She reports it is not draining or bleeding but the area continues to grow. She has had some symptoms of feeling poorly overall but it is difficult to ascertain what these are. Her sister thinks she is very sad about her situation and the patient agrees she has a hard time not thinking about her illness.    PHYSICAL EXAM:  Wt Readings from Last 3 Encounters:  07/07/20 153 lb 6.4 oz (69.6 kg)  05/24/20 154 lb 12.8 oz (70.2 kg)  05/23/20 157 lb 1.6 oz (71.3 kg)   Unable to assess due to encounter type.  ECOG = 2  0 - Asymptomatic (Fully active, able to carry on all predisease activities without restriction)  1 - Symptomatic but completely ambulatory (Restricted in physically strenuous activity but ambulatory and able to carry out work of a light or sedentary nature. For example, light housework, office work)  2 - Symptomatic, <50% in bed during the day (Ambulatory and capable of all self care but unable to carry out any work activities. Up and about more  than 50% of waking hours)  3 - Symptomatic, >50% in bed, but not bedbound (Capable of only limited self-care, confined to bed or chair 50% or more of waking hours)  4 - Bedbound (Completely disabled. Cannot carry on any self-care. Totally confined to bed or chair)  5 - Death   Eustace Pen MM, Creech RH, Tormey DC, et al. [phone]). "Toxicity and response criteria of the [facility]". [place] Oncol. 5 (6): 649-55    LABORATORY DATA:  Lab Results  Component Value Date   WBC 4.8 07/07/2020   HGB 11.4 (L) 07/07/2020   HCT 36.3 07/07/2020   MCV 94.5 07/07/2020   PLT 189 07/07/2020   Lab Results  Component Value Date   NA 140 05/23/2020   K 3.5 05/23/2020   CL 108 05/23/2020   CO2 24 05/23/2020   Lab Results  Component Value Date   ALT 20 05/23/2020   AST 25 05/23/2020   ALKPHOS 74 05/23/2020   BILITOT 0.6 05/23/2020      RADIOGRAPHY: No results found.     IMPRESSION/PLAN: 1. Stage IIIA, pT2N2aM0 grade 3, ER positive invasive ductal carcinoma of the left breast.  Dr. Lisbeth Renshaw discusses the patient's course and reviews the discussion that has been had previously and continues and that he would recommend she consider radiotherapy out of concern for her recurrences. We have reviewed the risks, benefits, short, and long term effects of radiotherapy. Dr. Lisbeth Renshaw offers the patient a course of 10 fractions of radiotherapy over 2 weeks with palliative intent. The patient is willing to come in on Friday to proceed with simulation at which time she would sign written consent to proceed.   Given current concerns for patient exposure during the COVID-19 pandemic, this encounter was conducted via telephone.  The patient has given verbal consent for this type of encounter. The time spent during this encounter was 45 minutes and 50% of that time was spent in the coordination of her care. The attendants for this meeting include Dr. Solon Augusta, PAC and [name]  and Bryon Lions. During the encounter, Dr. Lisbeth Renshaw and Shona Simpson [facility] were located at [facility] Rad[facility].  Tamikka Pilger and her sister Bryon Lions were located at home.  The above documentation reflects my direct findings during this shared patient visit. Please see the separate note by Dr. Lisbeth Renshaw  on this date for the remainder of the patient's plan of care.    Carola Rhine, PAC

## 2020-07-20 ENCOUNTER — Inpatient Hospital Stay: Payer: Medicare Other

## 2020-07-20 ENCOUNTER — Other Ambulatory Visit: Payer: Self-pay

## 2020-07-20 VITALS — BP 151/70 | HR 76

## 2020-07-20 DIAGNOSIS — Z170 Estrogen receptor positive status [ER+]: Secondary | ICD-10-CM | POA: Diagnosis not present

## 2020-07-20 DIAGNOSIS — Z79811 Long term (current) use of aromatase inhibitors: Secondary | ICD-10-CM | POA: Diagnosis not present

## 2020-07-20 DIAGNOSIS — C50812 Malignant neoplasm of overlapping sites of left female breast: Secondary | ICD-10-CM

## 2020-07-20 DIAGNOSIS — C50412 Malignant neoplasm of upper-outer quadrant of left female breast: Secondary | ICD-10-CM | POA: Diagnosis not present

## 2020-07-20 DIAGNOSIS — D0512 Intraductal carcinoma in situ of left breast: Secondary | ICD-10-CM

## 2020-07-20 LAB — CBC WITH DIFFERENTIAL/PLATELET
Abs Immature Granulocytes: 0.02 10*3/uL (ref 0.00–0.07)
Basophils Absolute: 0 10*3/uL (ref 0.0–0.1)
Basophils Relative: 1 %
Eosinophils Absolute: 0.1 10*3/uL (ref 0.0–0.5)
Eosinophils Relative: 2 %
HCT: 35.6 % — ABNORMAL LOW (ref 36.0–46.0)
Hemoglobin: 11.2 g/dL — ABNORMAL LOW (ref 12.0–15.0)
Immature Granulocytes: 0 %
Lymphocytes Relative: 30 %
Lymphs Abs: 1.5 10*3/uL (ref 0.7–4.0)
MCH: 29.2 pg (ref 26.0–34.0)
MCHC: 31.5 g/dL (ref 30.0–36.0)
MCV: 92.7 fL (ref 80.0–100.0)
Monocytes Absolute: 0.4 10*3/uL (ref 0.1–1.0)
Monocytes Relative: 8 %
Neutro Abs: 2.9 10*3/uL (ref 1.7–7.7)
Neutrophils Relative %: 59 %
Platelets: 182 10*3/uL (ref 150–400)
RBC: 3.84 MIL/uL — ABNORMAL LOW (ref 3.87–5.11)
RDW: 15.9 % — ABNORMAL HIGH (ref 11.5–15.5)
WBC: 4.9 10*3/uL (ref 4.0–10.5)
nRBC: 0 % (ref 0.0–0.2)

## 2020-07-20 MED ORDER — EPOETIN ALFA-EPBX 10000 UNIT/ML IJ SOLN
INTRAMUSCULAR | Status: AC
  Filled 2020-07-20: qty 2

## 2020-07-20 MED ORDER — EPOETIN ALFA-EPBX 40000 UNIT/ML IJ SOLN: 20000.0000 [IU] | Freq: Once | INTRAMUSCULAR | Status: DC

## 2020-07-20 NOTE — Progress Notes (Signed)
Patient will not receive injection because of hemoglobin values. Patient made aware.

## 2020-07-21 ENCOUNTER — Other Ambulatory Visit: Payer: Self-pay | Admitting: Oncology

## 2020-07-21 DIAGNOSIS — F320 Major depressive disorder, single episode, mild: Secondary | ICD-10-CM | POA: Diagnosis not present

## 2020-07-21 DIAGNOSIS — C50812 Malignant neoplasm of overlapping sites of left female breast: Secondary | ICD-10-CM | POA: Diagnosis not present

## 2020-07-21 DIAGNOSIS — G309 Alzheimer's disease, unspecified: Secondary | ICD-10-CM | POA: Diagnosis not present

## 2020-07-21 DIAGNOSIS — I10 Essential (primary) hypertension: Secondary | ICD-10-CM | POA: Diagnosis not present

## 2020-07-21 DIAGNOSIS — F0150 Vascular dementia without behavioral disturbance: Secondary | ICD-10-CM | POA: Diagnosis not present

## 2020-07-21 DIAGNOSIS — F0280 Dementia in other diseases classified elsewhere without behavioral disturbance: Secondary | ICD-10-CM | POA: Diagnosis not present

## 2020-07-22 ENCOUNTER — Other Ambulatory Visit: Payer: Self-pay

## 2020-07-22 ENCOUNTER — Ambulatory Visit
Admission: RE | Admit: 2020-07-22 | Discharge: 2020-07-22 | Disposition: A | Discharge status: Home / Self Care | Payer: Medicare Other | Source: Ambulatory Visit | Attending: Radiation Oncology | Admitting: Radiation Oncology

## 2020-07-22 DIAGNOSIS — C50812 Malignant neoplasm of overlapping sites of left female breast: Secondary | ICD-10-CM | POA: Insufficient documentation

## 2020-07-22 DIAGNOSIS — C7951 Secondary malignant neoplasm of bone: Secondary | ICD-10-CM | POA: Diagnosis not present

## 2020-08-01 ENCOUNTER — Ambulatory Visit: Payer: Medicare Other

## 2020-08-01 ENCOUNTER — Other Ambulatory Visit: Payer: Medicare Other

## 2020-08-02 ENCOUNTER — Inpatient Hospital Stay: Payer: Medicare Other | Attending: Oncology

## 2020-08-02 ENCOUNTER — Other Ambulatory Visit: Payer: Self-pay

## 2020-08-02 ENCOUNTER — Ambulatory Visit
Admission: RE | Admit: 2020-08-02 | Discharge: 2020-08-02 | Disposition: A | Discharge status: Home / Self Care | Payer: Medicare Other | Source: Ambulatory Visit | Attending: Radiation Oncology | Admitting: Radiation Oncology

## 2020-08-02 ENCOUNTER — Inpatient Hospital Stay: Payer: Medicare Other

## 2020-08-02 ENCOUNTER — Ambulatory Visit: Payer: Medicare Other | Admitting: Radiation Oncology

## 2020-08-02 DIAGNOSIS — E039 Hypothyroidism, unspecified: Secondary | ICD-10-CM | POA: Insufficient documentation

## 2020-08-02 DIAGNOSIS — D0512 Intraductal carcinoma in situ of left breast: Secondary | ICD-10-CM

## 2020-08-02 DIAGNOSIS — Z170 Estrogen receptor positive status [ER+]: Secondary | ICD-10-CM | POA: Insufficient documentation

## 2020-08-02 DIAGNOSIS — C50812 Malignant neoplasm of overlapping sites of left female breast: Secondary | ICD-10-CM | POA: Diagnosis not present

## 2020-08-02 DIAGNOSIS — C50412 Malignant neoplasm of upper-outer quadrant of left female breast: Secondary | ICD-10-CM | POA: Insufficient documentation

## 2020-08-02 DIAGNOSIS — Z5111 Encounter for antineoplastic chemotherapy: Secondary | ICD-10-CM | POA: Diagnosis not present

## 2020-08-02 DIAGNOSIS — C7951 Secondary malignant neoplasm of bone: Secondary | ICD-10-CM | POA: Insufficient documentation

## 2020-08-02 LAB — CBC WITH DIFFERENTIAL/PLATELET
Abs Immature Granulocytes: 0.01 10*3/uL (ref 0.00–0.07)
Basophils Absolute: 0 10*3/uL (ref 0.0–0.1)
Basophils Relative: 1 %
Eosinophils Absolute: 0.1 10*3/uL (ref 0.0–0.5)
Eosinophils Relative: 2 %
HCT: 36.4 % (ref 36.0–46.0)
Hemoglobin: 11.4 g/dL — ABNORMAL LOW (ref 12.0–15.0)
Immature Granulocytes: 0 %
Lymphocytes Relative: 30 %
Lymphs Abs: 1.3 10*3/uL (ref 0.7–4.0)
MCH: 29.1 pg (ref 26.0–34.0)
MCHC: 31.3 g/dL (ref 30.0–36.0)
MCV: 92.9 fL (ref 80.0–100.0)
Monocytes Absolute: 0.4 10*3/uL (ref 0.1–1.0)
Monocytes Relative: 8 %
Neutro Abs: 2.6 10*3/uL (ref 1.7–7.7)
Neutrophils Relative %: 59 %
Platelets: 199 10*3/uL (ref 150–400)
RBC: 3.92 MIL/uL (ref 3.87–5.11)
RDW: 15.9 % — ABNORMAL HIGH (ref 11.5–15.5)
WBC: 4.3 10*3/uL (ref 4.0–10.5)
nRBC: 0 % (ref 0.0–0.2)

## 2020-08-02 NOTE — Progress Notes (Signed)
Pt did not meet retacrit parameters with a Hgb of 11.4. Retacrit was not administered on 08/02/20.

## 2020-08-03 ENCOUNTER — Ambulatory Visit: Payer: Medicare Other

## 2020-08-03 ENCOUNTER — Other Ambulatory Visit: Payer: Medicare Other

## 2020-08-04 ENCOUNTER — Ambulatory Visit: Payer: Medicare Other

## 2020-08-04 DIAGNOSIS — C50812 Malignant neoplasm of overlapping sites of left female breast: Secondary | ICD-10-CM | POA: Diagnosis not present

## 2020-08-04 DIAGNOSIS — C7951 Secondary malignant neoplasm of bone: Secondary | ICD-10-CM | POA: Diagnosis not present

## 2020-08-04 DIAGNOSIS — Z170 Estrogen receptor positive status [ER+]: Secondary | ICD-10-CM | POA: Diagnosis not present

## 2020-08-05 ENCOUNTER — Ambulatory Visit: Payer: Medicare Other

## 2020-08-08 ENCOUNTER — Ambulatory Visit: Payer: Medicare Other

## 2020-08-09 ENCOUNTER — Other Ambulatory Visit: Payer: Self-pay

## 2020-08-09 ENCOUNTER — Ambulatory Visit
Admission: RE | Admit: 2020-08-09 | Discharge: 2020-08-09 | Disposition: A | Discharge status: Home / Self Care | Payer: Medicare Other | Source: Ambulatory Visit | Attending: Radiation Oncology | Admitting: Radiation Oncology

## 2020-08-09 ENCOUNTER — Ambulatory Visit: Payer: Medicare Other

## 2020-08-09 DIAGNOSIS — C50812 Malignant neoplasm of overlapping sites of left female breast: Secondary | ICD-10-CM | POA: Diagnosis not present

## 2020-08-09 DIAGNOSIS — Z170 Estrogen receptor positive status [ER+]: Secondary | ICD-10-CM | POA: Diagnosis not present

## 2020-08-09 DIAGNOSIS — C7951 Secondary malignant neoplasm of bone: Secondary | ICD-10-CM | POA: Diagnosis not present

## 2020-08-10 ENCOUNTER — Ambulatory Visit: Payer: Medicare Other

## 2020-08-10 ENCOUNTER — Telehealth: Payer: Self-pay | Admitting: *Deleted

## 2020-08-10 ENCOUNTER — Ambulatory Visit
Admission: RE | Admit: 2020-08-10 | Discharge: 2020-08-10 | Disposition: A | Discharge status: Home / Self Care | Payer: Medicare Other | Source: Ambulatory Visit | Attending: Radiation Oncology | Admitting: Radiation Oncology

## 2020-08-10 DIAGNOSIS — C50812 Malignant neoplasm of overlapping sites of left female breast: Secondary | ICD-10-CM | POA: Diagnosis not present

## 2020-08-10 DIAGNOSIS — C7951 Secondary malignant neoplasm of bone: Secondary | ICD-10-CM | POA: Diagnosis not present

## 2020-08-10 DIAGNOSIS — Z170 Estrogen receptor positive status [ER+]: Secondary | ICD-10-CM | POA: Diagnosis not present

## 2020-08-10 NOTE — Telephone Encounter (Signed)
VM left on this RN's line from the patient's care giver- Bryon Lions.  [name] stated concern due to " last week when we were there- I was given a hand out for her appointments with dates for today, tomorrow and Friday- and I had transportation arranged with a nurse - so when she brought her in - they were told she didn't have an appointment and turned her away "  Per VM - [name] states frustration and concern due to " it is very hard to get [name] ready and then arrange transportation - I just want to verify her appointments because what was given to me may not be correct "  [name] left return call number as [phone]

## 2020-08-11 ENCOUNTER — Ambulatory Visit
Admission: RE | Admit: 2020-08-11 | Discharge: 2020-08-11 | Disposition: A | Discharge status: Home / Self Care | Payer: Medicare Other | Source: Ambulatory Visit | Attending: Radiation Oncology | Admitting: Radiation Oncology

## 2020-08-11 ENCOUNTER — Other Ambulatory Visit: Payer: Self-pay

## 2020-08-11 ENCOUNTER — Ambulatory Visit: Payer: Medicare Other

## 2020-08-11 DIAGNOSIS — Z170 Estrogen receptor positive status [ER+]: Secondary | ICD-10-CM | POA: Diagnosis not present

## 2020-08-11 DIAGNOSIS — C50812 Malignant neoplasm of overlapping sites of left female breast: Secondary | ICD-10-CM | POA: Diagnosis not present

## 2020-08-11 DIAGNOSIS — C7951 Secondary malignant neoplasm of bone: Secondary | ICD-10-CM | POA: Diagnosis not present

## 2020-08-12 ENCOUNTER — Ambulatory Visit: Payer: Medicare Other

## 2020-08-12 ENCOUNTER — Ambulatory Visit
Admission: RE | Admit: 2020-08-12 | Discharge: 2020-08-12 | Disposition: A | Discharge status: Home / Self Care | Payer: Medicare Other | Source: Ambulatory Visit | Attending: Radiation Oncology | Admitting: Radiation Oncology

## 2020-08-12 DIAGNOSIS — Z170 Estrogen receptor positive status [ER+]: Secondary | ICD-10-CM | POA: Diagnosis not present

## 2020-08-12 DIAGNOSIS — C50812 Malignant neoplasm of overlapping sites of left female breast: Secondary | ICD-10-CM

## 2020-08-12 DIAGNOSIS — C7951 Secondary malignant neoplasm of bone: Secondary | ICD-10-CM | POA: Diagnosis not present

## 2020-08-12 MED ORDER — SONAFINE EX EMUL
1.0000 "application " | Freq: Once | CUTANEOUS | Status: AC
  Administered 2020-08-12: 1 via TOPICAL

## 2020-08-15 ENCOUNTER — Other Ambulatory Visit: Payer: Self-pay

## 2020-08-15 ENCOUNTER — Ambulatory Visit: Payer: Medicare Other

## 2020-08-15 ENCOUNTER — Ambulatory Visit
Admission: RE | Admit: 2020-08-15 | Discharge: 2020-08-15 | Disposition: A | Discharge status: Home / Self Care | Payer: Medicare Other | Source: Ambulatory Visit | Attending: Radiation Oncology | Admitting: Radiation Oncology

## 2020-08-15 ENCOUNTER — Other Ambulatory Visit: Payer: Medicare Other

## 2020-08-15 DIAGNOSIS — C7951 Secondary malignant neoplasm of bone: Secondary | ICD-10-CM | POA: Diagnosis not present

## 2020-08-15 DIAGNOSIS — C50812 Malignant neoplasm of overlapping sites of left female breast: Secondary | ICD-10-CM | POA: Diagnosis not present

## 2020-08-15 DIAGNOSIS — Z170 Estrogen receptor positive status [ER+]: Secondary | ICD-10-CM | POA: Diagnosis not present

## 2020-08-16 ENCOUNTER — Ambulatory Visit
Admission: RE | Admit: 2020-08-16 | Discharge: 2020-08-16 | Disposition: A | Discharge status: Home / Self Care | Payer: Medicare Other | Source: Ambulatory Visit | Attending: Radiation Oncology | Admitting: Radiation Oncology

## 2020-08-16 ENCOUNTER — Other Ambulatory Visit: Payer: Self-pay

## 2020-08-16 DIAGNOSIS — Z170 Estrogen receptor positive status [ER+]: Secondary | ICD-10-CM | POA: Diagnosis not present

## 2020-08-16 DIAGNOSIS — C50812 Malignant neoplasm of overlapping sites of left female breast: Secondary | ICD-10-CM | POA: Diagnosis not present

## 2020-08-16 DIAGNOSIS — C7951 Secondary malignant neoplasm of bone: Secondary | ICD-10-CM | POA: Diagnosis not present

## 2020-08-16 NOTE — Progress Notes (Signed)
[place]  [phone] [phone]    ID: Shelia Marks DOB: 11/02/1926  MR#: [id number]  SFK#:[id number]  Patient Care Team: Shelia Curry, DO as PCP - General (Geriatric Medicine) Shelia Guys, MD as Consulting Physician (Ophthalmology) Marks, Shelia Dad, MD as Consulting Physician (Oncology) Shelia Kussmaul, MD as Consulting Physician (General Surgery) Shelia Pi, MD as Consulting Physician (Endocrinology) Shelia Gibson, MD as Attending Physician (Radiation Oncology) OTHER MD:   CHIEF COMPLAINT: Estrogen receptor positive breast cancer (s/p left mastectomy)  CURRENT TREATMENT: Changing to Fulvestrant; adjuvant radiation therapy.     INTERVAL HISTORY: Shelia Marks returns today for follow-up of her estrogen receptor positive breast cancer. She is accompanied by her caregiver.  The caregiver has a speech impediment, and is difficult to understand.    Shelia Marks has had several left chest wall nodules that are subcutaneous.  She is currently undergoing adjuvant radiation therapy for these.    REVIEW OF SYSTEMS: Shelia Marks and her caregiver could not verify her oral medications.  She notes that she is feeling well and is doing ok with her radiation therapy.  She denies any new issues.  Her weight is down by 5 pounds since her last visit.  She is not having any more pain.  A detailed ROS was otherwise non contributory today.     BREAST CANCER HISTORY: From the original intake note:  "Shelia Marks" had bilateral screening mammography at the [facility] 06/07/2016 showing calcifications in the left breast. She was recalled  05/15/2016 for left diagnostic mammography. This showed the breast density to be category be. In the upper outer left breast there was an area of suspicious calcifications measuring 1.7 cm.  Biopsy of this area was obtained 05/21/2016, and showed ([id number]) ductal carcinoma in situ, high-grade, estrogen receptor 100%  positive, progesterone receptor 5% positive, WITH strong staining intensity.  Her subsequent history is as detailed below.   PAST MEDICAL HISTORY: Past Medical History:  Diagnosis Date  . Alzheimer's disease ([place])   . Cancer ([place])   . Disorder of bone and cartilage, unspecified   . Dizziness and giddiness   . Hyperlipidemia LDL goal < 100   . Hypertension   . Hypothyroidism   . Malignant neoplasm of thyroid gland ([place])   . Nontoxic uninodular goiter   . Other malaise and fatigue   . Other symptoms involving cardiovascular system   . Phlebitis and thrombophlebitis of unspecified site   . Senile osteoporosis   . Sleep related leg cramps   . Stroke ([place])   . Unspecified disorder of lipoid metabolism   . Unspecified hereditary and idiopathic peripheral neuropathy   . Varicose veins of lower extremities with inflammation     PAST SURGICAL HISTORY: Past Surgical History:  Procedure Laterality Date  . ABDOMINAL HYSTERECTOMY     DR HUGES  . BREAST BIOPSY Left 05/21/2016   malignant  . CATRACT SURGERY  2008  . COLON SURGERY     clipped polyp during colonoscopy  . EXCISION OF KELOID Left 02/19/2020   Procedure: EXCISION NODULE LEFT CHEST WALL;  Surgeon: Shelia Kussmaul, MD;  Location: [facility];  Service: General;  Laterality: Left;  . KNEE SURGERY     right  . MASTECTOMY MODIFIED RADICAL Left 09/10/2019   Procedure: LEFT MASTECTOMY MODIFIED RADICAL;  Surgeon: Shelia Kussmaul, MD;  Location: [facility];  Service: General;  Laterality: Left;  . PARATHYROID ADENOMA REMOVED  1983  . THYROID SURGERY  July 20, 2011  FAMILY HISTORY Family History  Problem Relation Age of Onset  . Diabetes Brother    The patient's father died from liver problems in his 38s. The patient's mother died in her 1s from "natural causes". The patient had no brothers. She had 6 sisters, 2 of whom died from complications of diabetes and one from a ruptured aneurysm. There is no history of breast  or ovarian cancer in the family.    GYNECOLOGIC HISTORY:  No LMP recorded. Patient has had a hysterectomy.  Shelia Marks does not remember how she was when she had her first period. She never carried a child to term. She is status post hysterectomy and at least one ovary was removed. She never took hormone replacement.    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  She is widowed and lives with her sister.   ADVANCED DIRECTIVES: [name], Shelia Marks, is her healthcare power of attorney. Shelia Marks can be reached at [phone].   HEALTH MAINTENANCE: Social History   Tobacco Use  . Smoking status: Never Smoker  . Smokeless tobacco: Never Used  Vaping Use  . Vaping Use: Never used  Substance Use Topics  . Alcohol use: No  . Drug use: No     No Known Allergies  Current Outpatient Medications  Medication Sig Dispense Refill  . acetaminophen (TYLENOL) 325 MG tablet Take 2 tablets (650 mg total) by mouth every 6 (six) hours as needed for mild pain, moderate pain, fever or headache.    . alendronate (FOSAMAX) 70 MG tablet TAKE 1 TABLET (70 MG TOTAL) EVERY 7 (SEVEN) DAYS. TAKE WITH A FULL GLASS OF WATER ON AN EMPTY STOMACH. (Patient taking differently: Take 70 mg by mouth every Monday. ) 12 tablet 1  . allopurinol (ZYLOPRIM) 300 MG tablet TAKE 1 TABLET EVERY DAY (Patient taking differently: Take 300 mg by mouth daily. ) 90 tablet 1  . amLODipine (NORVASC) 10 MG tablet TAKE 1 TABLET EVERY DAY 90 tablet 1  . aspirin EC 81 MG tablet Take 1 tablet (81 mg total) by mouth daily. 30 tablet 3  . atenolol (TENORMIN) 25 MG tablet TAKE 1 TABLET EVERY DAY (Patient taking differently: Take 25 mg by mouth daily. ) 90 tablet 1  . atorvastatin (LIPITOR) 40 MG tablet Take 1 tablet (40 mg total) by mouth daily. 90 tablet 0  . fulvestrant (FASLODEX) 250 MG/5ML injection Inject 500 mg into the muscle once. One injection each buttock over 1-2 minutes. Warm  prior to use.    Marland Kitchen HYDROcodone-acetaminophen (NORCO/VICODIN) 5-325 MG tablet Take 0.5-1 tablets by mouth every 6 (six) hours as needed for severe pain. 10 tablet 0  . levothyroxine (SYNTHROID) 112 MCG tablet TAKE 1 TABLET BY MOUTH EVERY DAY (Patient taking differently: Take 112 mcg by mouth daily before breakfast. ) 90 tablet 3  . lisinopril (ZESTRIL) 2.5 MG tablet TAKE 1 TABLET DAILY TO CONTROL BLOOD PRESSURE 90 tablet 1  . magnesium hydroxide (MILK OF MAGNESIA) 400 MG/5ML suspension Take 5-10 mLs by mouth daily as needed for mild constipation.    . sertraline (ZOLOFT) 50 MG tablet TAKE ONE TABLET DAILY FOR DEPRESSION (Patient taking differently: Take 50 mg by mouth daily. ) 90 tablet 1   No current facility-administered medications for this visit.    OBJECTIVE: African-American woman examined in a wheelchair Vitals:   08/17/20 1329  BP: (!) 194/75  Pulse: 62  Resp: 17  Temp: 97.8 F (36.6 C)  SpO2: 99%     Body mass index is 27.93 kg/m.    ECOG FS:2 - Symptomatic, <50% confined to bed GENERAL: Patient is a well appearing female in no acute distress HEENT:  Sclerae anicteric.  Mask in place.  Neck is supple.  NODES:  No cervical, supraclavicular, or axillary lymphadenopathy palpated.  BREAST EXAM: left chest wall with some radiation changes to the skin, and subcutaneous cancerous nodules noted.  Right breast benign.   LUNGS:  Clear to auscultation bilaterally.  No wheezes or rhonchi. HEART:  Regular rate and rhythm. No murmur appreciated. ABDOMEN:  Soft, nontender.  Positive, normoactive bowel sounds. No organomegaly palpated. MSK:  No focal spinal tenderness to palpation. Full range of motion bilaterally in the upper extremities. EXTREMITIES:  No peripheral edema.   SKIN:  Clear with no obvious rashes or skin changes. No nail dyscrasia. NEURO:  Nonfocal. Well oriented.  Appropriate affect.      LAB RESULTS:  CMP     Component Value Date/Time   NA 140 05/23/2020 1454   NA  143 06/01/2016 0958   K 3.5 05/23/2020 1454   CL 108 05/23/2020 1454   CO2 24 05/23/2020 1454   GLUCOSE 84 05/23/2020 1454   BUN 16 05/23/2020 1454   BUN 21 06/01/2016 0958   CREATININE 1.00 05/23/2020 1454   CREATININE 1.13 (H) 02/04/2020 1019   CREATININE 0.90 (H) 01/18/2020 1125   CALCIUM 9.3 05/23/2020 1454   PROT 7.0 05/23/2020 1454   PROT 6.8 11/09/2015 1013   ALBUMIN 3.2 (L) 05/23/2020 1454   ALBUMIN 4.1 11/09/2015 1013   AST 25 05/23/2020 1454   AST 29 02/04/2020 1019   ALT 20 05/23/2020 1454   ALT 27 02/04/2020 1019   ALKPHOS 74 05/23/2020 1454   BILITOT 0.6 05/23/2020 1454   BILITOT 0.6 02/04/2020 1019   GFRNONAA 49 (L) 05/23/2020 1454   GFRNONAA 42 (L) 02/04/2020 1019   GFRNONAA 51 (L) 02/25/2018 0937   GFRAA 57 (L) 05/23/2020 1454   GFRAA 49 (L) 02/04/2020 1019   GFRAA 59 (L) 02/25/2018 0937    INo results found for: SPEP, UPEP  Lab Results  Component Value Date   WBC 3.4 (L) 08/17/2020   NEUTROABS 2.2 08/17/2020   HGB 11.0 (L) 08/17/2020   HCT 35.0 (L) 08/17/2020   MCV 95.1 08/17/2020   PLT 195 08/17/2020      Chemistry      Component Value Date/Time   NA 140 05/23/2020 1454   NA 143 06/01/2016 0958   K 3.5 05/23/2020 1454   CL 108 05/23/2020 1454   CO2 24 05/23/2020 1454   BUN 16 05/23/2020 1454   BUN 21 06/01/2016 0958   CREATININE 1.00 05/23/2020 1454   CREATININE 1.13 (H) 02/04/2020 1019   CREATININE 0.90 (H) 01/18/2020 1125      Component Value Date/Time   CALCIUM 9.3 05/23/2020 1454   ALKPHOS 74 05/23/2020 1454   AST 25 05/23/2020 1454   AST 29 02/04/2020 1019   ALT 20 05/23/2020 1454   ALT 27 02/04/2020 1019   BILITOT 0.6 05/23/2020 1454   BILITOT 0.6 02/04/2020 1019       No results found for: LABCA2  No components found for: LABCA125  No results for input(s): INR in the last 168 hours.  Urinalysis    Component Value Date/Time   COLORURINE YELLOW 05/23/2020 1454   APPEARANCEUR CLEAR 05/23/2020 1454   LABSPEC 1.016  05/23/2020 1454   PHURINE 6.0 05/23/2020 1454  GLUCOSEU NEGATIVE 05/23/2020 [place] 05/23/2020 [place] 05/23/2020 [place] 05/23/2020 1454   PROTEINUR NEGATIVE 05/23/2020 1454   UROBILINOGEN 0.2 03/05/2014 2055   NITRITE NEGATIVE 05/23/2020 1454   LEUKOCYTESUR SMALL (A) 05/23/2020 1454    STUDIES: No results found.   ELIGIBLE FOR AVAILABLE RESEARCH PROTOCOL: no   ASSESSMENT: 86 y.o. Shelia Marks woman status post left breast upper outer quadrant biopsy 05/21/2016 for ductal carcinoma in situ, high-grade, estrogen and progesterone receptor positive  (1) opted against surgery   (2) started anastrozole neoadjuvantly 06/13/2016, discontinued June 2018 due to osteoporosis concerns  (a)  bone density 11/16/2016 shows a T score of -4.7 (osteoporosis)-on fosamax.   (3) mammography/ultrasonography 06/10/2018 shows evidence of disease progression, possible lymph node extension  (4) letrozole started 06/27/2018   (a) discontinued November 2019 with evidence of progression  (b) restarted 09/2019, to change to Exemestane in 12/2019  (5) fulvestrant started 11/21/2018, discontinued 03/12/2019  (a) mammography and ultrasonography 07/24/2019 shows evidence of disease progression  (6) Left modified radical mastectomy on 09/10/2019: showing a stage IIIA, T2, N2a, invasive ductal carcinoma, grade 3, margins negative, estrogen receptor positive, progesterone receptor and HER-2 negative, with an MIB-1 of 90%  (a) 4 out of 14 lymph nodes positive for macrometastases  (b) 1 out of 14 lymph nodes positive for micrometastases  (c) CT chest on 08/26/2019 negative for metastatic disease  (7) Adjuvant radiation recommended on 09/24/2019 and again on10/09/2019, declined by patient  (8) removal of a left chest wall lesion 02/19/2020 showing metastatic breast cancer, estrogen receptor 95% and strongly positive, progesterone receptor negative, HER-2 negative  with an MIB-1 of 50%  (a) patient and sister again opted against adjuvant radiation at 03/07/2020 visit  (b) additional lesions noted clincially 07/07/2020  (c) adjuvant radiation to left chest wall from 08/09/2020 through 08/22/2020  (9) exemestane started December 2020 through 07/2020 due to progression  (b) began Fulvestrant on 08/17/2020     PLAN:  Shelia Marks is doing well today.  I reviewed with her and her caregiver, that she should stop taking the exemestane as she is now transitioning to Fulvestrant to be given every 4 weeks.  I reminded Shelia Marks, that these are the buttock shots.  She understands.    Her skin is not peeling and she is tolerating the adjuvant radiation quite well.  She will continue this daily through 08/22/2020.  I have sent a scheduling message to have her f/u with Dr. Jana Hakim in 4 weeks to see how she is tolerating Fulvestrant, and since completing radiation therapy.    Shelia Marks and her caregiver verbalized understanding.  She knows to call for any questions that may arise between now and her next appointment.  We are happy to see her sooner if needed.   Total encounter time 20 minutes.Wilber Bihari, NP 08/17/20 9:31 PM Medical Oncology and Hematology [facility] [place], [place] [address] Tel. [phone]    Fax. [phone]    *Total Encounter Time as defined by the Centers for Medicare and Medicaid Services includes, in addition to the face-to-face time of a patient visit (documented in the note above) non-face-to-face time: obtaining and reviewing outside history, ordering and reviewing medications, tests or procedures, care coordination (communications with other health care professionals or caregivers) and documentation in the medical record.

## 2020-08-17 ENCOUNTER — Inpatient Hospital Stay: Payer: Medicare Other

## 2020-08-17 ENCOUNTER — Other Ambulatory Visit: Payer: Self-pay

## 2020-08-17 ENCOUNTER — Inpatient Hospital Stay (HOSPITAL_BASED_OUTPATIENT_CLINIC_OR_DEPARTMENT_OTHER): Payer: Medicare Other | Admitting: Adult Health

## 2020-08-17 ENCOUNTER — Telehealth: Payer: Self-pay | Admitting: Oncology

## 2020-08-17 ENCOUNTER — Ambulatory Visit
Admission: RE | Admit: 2020-08-17 | Discharge: 2020-08-17 | Disposition: A | Discharge status: Home / Self Care | Payer: Medicare Other | Source: Ambulatory Visit | Attending: Radiation Oncology | Admitting: Radiation Oncology

## 2020-08-17 VITALS — BP 164/74 | HR 67

## 2020-08-17 VITALS — BP 194/75 | HR 62 | Temp 97.8°F | Resp 17 | Ht 61.0 in | Wt 147.8 lb

## 2020-08-17 DIAGNOSIS — C50812 Malignant neoplasm of overlapping sites of left female breast: Secondary | ICD-10-CM

## 2020-08-17 DIAGNOSIS — Z170 Estrogen receptor positive status [ER+]: Secondary | ICD-10-CM

## 2020-08-17 DIAGNOSIS — Z5111 Encounter for antineoplastic chemotherapy: Secondary | ICD-10-CM | POA: Diagnosis not present

## 2020-08-17 DIAGNOSIS — D0512 Intraductal carcinoma in situ of left breast: Secondary | ICD-10-CM

## 2020-08-17 DIAGNOSIS — E039 Hypothyroidism, unspecified: Secondary | ICD-10-CM | POA: Diagnosis not present

## 2020-08-17 DIAGNOSIS — C7951 Secondary malignant neoplasm of bone: Secondary | ICD-10-CM | POA: Diagnosis not present

## 2020-08-17 DIAGNOSIS — I6350 Cerebral infarction due to unspecified occlusion or stenosis of unspecified cerebral artery: Secondary | ICD-10-CM | POA: Diagnosis not present

## 2020-08-17 DIAGNOSIS — C50412 Malignant neoplasm of upper-outer quadrant of left female breast: Secondary | ICD-10-CM | POA: Diagnosis not present

## 2020-08-17 DIAGNOSIS — D631 Anemia in chronic kidney disease: Secondary | ICD-10-CM

## 2020-08-17 DIAGNOSIS — N183 Chronic kidney disease, stage 3 unspecified: Secondary | ICD-10-CM

## 2020-08-17 LAB — CBC WITH DIFFERENTIAL/PLATELET
Abs Immature Granulocytes: 0.02 10*3/uL (ref 0.00–0.07)
Basophils Absolute: 0 10*3/uL (ref 0.0–0.1)
Basophils Relative: 1 %
Eosinophils Absolute: 0.1 10*3/uL (ref 0.0–0.5)
Eosinophils Relative: 2 %
HCT: 35 % — ABNORMAL LOW (ref 36.0–46.0)
Hemoglobin: 11 g/dL — ABNORMAL LOW (ref 12.0–15.0)
Immature Granulocytes: 1 %
Lymphocytes Relative: 23 %
Lymphs Abs: 0.8 10*3/uL (ref 0.7–4.0)
MCH: 29.9 pg (ref 26.0–34.0)
MCHC: 31.4 g/dL (ref 30.0–36.0)
MCV: 95.1 fL (ref 80.0–100.0)
Monocytes Absolute: 0.3 10*3/uL (ref 0.1–1.0)
Monocytes Relative: 9 %
Neutro Abs: 2.2 10*3/uL (ref 1.7–7.7)
Neutrophils Relative %: 64 %
Platelets: 195 10*3/uL (ref 150–400)
RBC: 3.68 MIL/uL — ABNORMAL LOW (ref 3.87–5.11)
RDW: 15.1 % (ref 11.5–15.5)
WBC: 3.4 10*3/uL — ABNORMAL LOW (ref 4.0–10.5)
nRBC: 0 % (ref 0.0–0.2)

## 2020-08-17 MED ORDER — FULVESTRANT 250 MG/5ML IM SOLN
INTRAMUSCULAR | Status: AC
  Filled 2020-08-17: qty 5

## 2020-08-17 MED ORDER — FULVESTRANT 250 MG/5ML IM SOLN
500.0000 mg | Freq: Once | INTRAMUSCULAR | Status: AC
  Administered 2020-08-17: 500 mg via INTRAMUSCULAR

## 2020-08-17 NOTE — Telephone Encounter (Signed)
Scheduled appts per 8/18 los. Gave pt a print out of AVS.

## 2020-08-17 NOTE — Patient Instructions (Signed)
Fulvestrant injection What is this medicine? FULVESTRANT (ful VES trant) blocks the effects of estrogen. It is used to treat breast cancer. This medicine may be used for other purposes; ask your health care provider or pharmacist if you have questions. COMMON BRAND NAME(S): FASLODEX What should I tell my health care provider before I take this medicine? They need to know if you have any of these conditions:  bleeding disorders  liver disease  low blood counts, like low white cell, platelet, or red cell counts  an unusual or allergic reaction to fulvestrant, other medicines, foods, dyes, or preservatives  pregnant or trying to get pregnant  breast-feeding How should I use this medicine? This medicine is for injection into a muscle. It is usually given by a health care professional in a hospital or clinic setting. Talk to your pediatrician regarding the use of this medicine in children. Special care may be needed. Overdosage: If you think you have taken too much of this medicine contact a poison control center or emergency room at once. NOTE: This medicine is only for you. Do not share this medicine with others. What if I miss a dose? It is important not to miss your dose. Call your doctor or health care professional if you are unable to keep an appointment. What may interact with this medicine?  medicines that treat or prevent blood clots like warfarin, enoxaparin, dalteparin, apixaban, dabigatran, and rivaroxaban This list may not describe all possible interactions. Give your health care provider a list of all the medicines, herbs, non-prescription drugs, or dietary supplements you use. Also tell them if you smoke, drink alcohol, or use illegal drugs. Some items may interact with your medicine. What should I watch for while using this medicine? Your condition will be monitored carefully while you are receiving this medicine. You will need important blood work done while you are taking  this medicine. Do not become pregnant while taking this medicine or for at least 1 year after stopping it. Women of child-bearing potential will need to have a negative pregnancy test before starting this medicine. Women should inform their doctor if they wish to become pregnant or think they might be pregnant. There is a potential for serious side effects to an unborn child. Men should inform their doctors if they wish to father a child. This medicine may lower sperm counts. Talk to your health care professional or pharmacist for more information. Do not breast-feed an infant while taking this medicine or for 1 year after the last dose. What side effects may I notice from receiving this medicine? Side effects that you should report to your doctor or health care professional as soon as possible:  allergic reactions like skin rash, itching or hives, swelling of the face, lips, or tongue  feeling faint or lightheaded, falls  pain, tingling, numbness, or weakness in the legs  signs and symptoms of infection like fever or chills; cough; flu-like symptoms; sore throat  vaginal bleeding Side effects that usually do not require medical attention (report to your doctor or health care professional if they continue or are bothersome):  aches, pains  constipation  diarrhea  headache  hot flashes  nausea, vomiting  pain at site where injected  stomach pain This list may not describe all possible side effects. Call your doctor for medical advice about side effects. You may report side effects to FDA at 1-800-FDA-1088. Where should I keep my medicine? This drug is given in a hospital or clinic and will   not be stored at home. NOTE: This sheet is a summary. It may not cover all possible information. If you have questions about this medicine, talk to your doctor, pharmacist, or health care provider.  2020 Elsevier/Gold Standard (2018-03-27 11:34:41)  

## 2020-08-18 ENCOUNTER — Ambulatory Visit
Admission: RE | Admit: 2020-08-18 | Discharge: 2020-08-18 | Disposition: A | Discharge status: Home / Self Care | Payer: Medicare Other | Source: Ambulatory Visit | Attending: Radiation Oncology | Admitting: Radiation Oncology

## 2020-08-18 ENCOUNTER — Other Ambulatory Visit: Payer: Self-pay

## 2020-08-18 DIAGNOSIS — Z170 Estrogen receptor positive status [ER+]: Secondary | ICD-10-CM | POA: Diagnosis not present

## 2020-08-18 DIAGNOSIS — C7951 Secondary malignant neoplasm of bone: Secondary | ICD-10-CM | POA: Diagnosis not present

## 2020-08-18 DIAGNOSIS — C50812 Malignant neoplasm of overlapping sites of left female breast: Secondary | ICD-10-CM | POA: Diagnosis not present

## 2020-08-19 ENCOUNTER — Ambulatory Visit
Admission: RE | Admit: 2020-08-19 | Discharge: 2020-08-19 | Disposition: A | Discharge status: Home / Self Care | Payer: Medicare Other | Source: Ambulatory Visit | Attending: Radiation Oncology | Admitting: Radiation Oncology

## 2020-08-19 ENCOUNTER — Other Ambulatory Visit: Payer: Self-pay

## 2020-08-19 DIAGNOSIS — C7951 Secondary malignant neoplasm of bone: Secondary | ICD-10-CM | POA: Diagnosis not present

## 2020-08-19 DIAGNOSIS — C50812 Malignant neoplasm of overlapping sites of left female breast: Secondary | ICD-10-CM | POA: Diagnosis not present

## 2020-08-19 DIAGNOSIS — Z170 Estrogen receptor positive status [ER+]: Secondary | ICD-10-CM | POA: Diagnosis not present

## 2020-08-22 ENCOUNTER — Other Ambulatory Visit: Payer: Self-pay

## 2020-08-22 ENCOUNTER — Ambulatory Visit
Admission: RE | Admit: 2020-08-22 | Discharge: 2020-08-22 | Disposition: A | Discharge status: Home / Self Care | Payer: Medicare Other | Source: Ambulatory Visit | Attending: Radiation Oncology | Admitting: Radiation Oncology

## 2020-08-22 ENCOUNTER — Encounter: Payer: Self-pay | Admitting: Radiation Oncology

## 2020-08-22 ENCOUNTER — Telehealth: Payer: Self-pay

## 2020-08-22 DIAGNOSIS — C50812 Malignant neoplasm of overlapping sites of left female breast: Secondary | ICD-10-CM | POA: Diagnosis not present

## 2020-08-22 DIAGNOSIS — C7951 Secondary malignant neoplasm of bone: Secondary | ICD-10-CM | POA: Diagnosis not present

## 2020-08-22 DIAGNOSIS — Z170 Estrogen receptor positive status [ER+]: Secondary | ICD-10-CM | POA: Diagnosis not present

## 2020-08-22 NOTE — Telephone Encounter (Signed)
I spoke with [name] and informed her of Dr.Reed's response

## 2020-08-22 NOTE — Telephone Encounter (Signed)
I'm so glad to hear this.  I believe [name] can extend the services directly with the Shelia Marks services.  No special order is needed from me.

## 2020-08-22 NOTE — Telephone Encounter (Signed)
Patient's sister [name] called to inform Dr.Reed that she is so pleased with the service and personality of the home health aid that patient has through [facility]. Per [name] Dr.Reed set this up and she is beyond satisfied.   [name] is asking if there is any way to extend the service she would like for that to happen. Home health aid is currently coming out 5 days a week.

## 2020-09-07 ENCOUNTER — Other Ambulatory Visit: Payer: Self-pay | Admitting: Internal Medicine

## 2020-09-08 NOTE — Telephone Encounter (Signed)
rx sent to pharmacy by e-script  

## 2020-09-09 NOTE — Progress Notes (Signed)
  Radiation Oncology         [phone] ________________________________  Name: Shelia Marks MRN: [id number]  Date: 08/22/2020  DOB: [birth year]  End of Treatment Note  Diagnosis:   Left breast cancer     Indication for treatment::  palliative       Radiation treatment dates:   08/09/20 - 08/22/20  Site/dose:   The patient was treated to the left chest wall to the affected area to a dose of 30 Gray in 10 fractions at 3 Gy per fraction.  This was accomplished using a 4 field 3D conformal technique.  Narrative: The patient tolerated radiation treatment relatively well.   No major difficulties during treatment.  Plan: The patient has completed radiation treatment. The patient will return to radiation oncology clinic for routine followup in one month. I advised the patient to call or return sooner if they have any questions or concerns related to their recovery or treatment. ________________________________  Jodelle Gross, M.D., Ph.D.

## 2020-09-20 NOTE — Progress Notes (Signed)
Shelia Marks  [phone] [phone]    ID: Shelia Marks DOB: [birth year]  MR#: [id number]  VQM#:[id number]  Patient Care Team: Gayland Curry, DO as PCP - General (Geriatric Medicine) Rutherford Guys, MD as Consulting Physician (Ophthalmology) Magrinat, Virgie Dad, MD as Consulting Physician (Oncology) Jovita Kussmaul, MD as Consulting Physician (General Surgery) Jacelyn Pi, MD as Consulting Physician (Endocrinology) OTHER MD:   CHIEF COMPLAINT: Estrogen receptor positive breast cancer (s/p left mastectomy)  CURRENT TREATMENT: Changing to Fulvestrant;   INTERVAL HISTORY: Shelia Marks returns today for follow-up of her estrogen receptor positive breast cancer.   She receives Fulvestrant every 4 weeks.  She completed radiation to her chest wall subcutaneous lesions from 08/09/2020 through 08/22/2020.  Her skin is healing well.  She has no new concerns today.    REVIEW OF SYSTEMS: Shelia Marks is doing well.  She is with her sister who notes the dementia is worsening.  She is taking Zoloft daily.  She sees her PCP and was prescribed zoloft daily.  She denies any new pain, fever, chills, chest pain, cough, palpitations, shortness of breath, headaches, vision issues or any other concerns.  A detailed ROS was otherwise non contributory.     BREAST CANCER HISTORY: From the original intake note:  "Shelia Marks" had bilateral screening mammography at [facility] 06/07/2016 showing calcifications in the left breast. She was recalled  05/15/2016 for left diagnostic mammography. This showed the breast density to be category be. In the upper outer left breast there was an area of suspicious calcifications measuring 1.7 cm.  Biopsy of this area was obtained 05/21/2016, and showed ([id number]) ductal carcinoma in situ, high-grade, estrogen receptor 100% positive, progesterone receptor 5% positive, WITH strong staining intensity.  Her subsequent history is as  detailed below.   PAST MEDICAL HISTORY: Past Medical History:  Diagnosis Date  . Alzheimer's disease ([place])   . Cancer ([place])   . Disorder of bone and cartilage, unspecified   . Dizziness and giddiness   . Hyperlipidemia LDL goal < 100   . Hypertension   . Hypothyroidism   . Malignant neoplasm of thyroid gland ([place])   . Nontoxic uninodular goiter   . Other malaise and fatigue   . Other symptoms involving cardiovascular system   . Phlebitis and thrombophlebitis of unspecified site   . Senile osteoporosis   . Sleep related leg cramps   . Stroke ([place])   . Unspecified disorder of lipoid metabolism   . Unspecified hereditary and idiopathic peripheral neuropathy   . Varicose veins of lower extremities with inflammation     PAST SURGICAL HISTORY: Past Surgical History:  Procedure Laterality Date  . ABDOMINAL HYSTERECTOMY     DR HUGES  . BREAST BIOPSY Left 05/21/2016   malignant  . CATRACT SURGERY  2008  . COLON SURGERY     clipped polyp during colonoscopy  . EXCISION OF KELOID Left 02/19/2020   Procedure: EXCISION NODULE LEFT CHEST WALL;  Surgeon: Jovita Kussmaul, MD;  Location: [facility];  Service: General;  Laterality: Left;  . KNEE SURGERY     right  . MASTECTOMY MODIFIED RADICAL Left 09/10/2019   Procedure: LEFT MASTECTOMY MODIFIED RADICAL;  Surgeon: Jovita Kussmaul, MD;  Location: [facility];  Service: General;  Laterality: Left;  . PARATHYROID ADENOMA REMOVED  1983  . THYROID SURGERY  July 20, 2011    FAMILY HISTORY Family History  Problem Relation Age of Onset  . Diabetes Brother  The patient's father died from liver problems in his 38s. The patient's mother died in her 44s from "natural causes". The patient had no brothers. She had 6 sisters, 2 of whom died from complications of diabetes and one from a ruptured aneurysm. There is no history of breast or ovarian cancer in the family.    GYNECOLOGIC HISTORY:  No LMP recorded. Patient has had a  hysterectomy.  Shelia Marks does not remember how she was when she had her first period. She never carried a child to term. She is status post hysterectomy and at least one ovary was removed. She never took hormone replacement.    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  She is widowed and lives with her sister.   ADVANCED DIRECTIVES: [name], Shelia Marks, is her healthcare power of attorney. Shelia Marks can be reached at [phone].   HEALTH MAINTENANCE: Social History   Tobacco Use  . Smoking status: Never Smoker  . Smokeless tobacco: Never Used  Vaping Use  . Vaping Use: Never used  Substance Use Topics  . Alcohol use: No  . Drug use: No     No Known Allergies  Current Outpatient Medications  Medication Sig Dispense Refill  . acetaminophen (TYLENOL) 325 MG tablet Take 2 tablets (650 mg total) by mouth every 6 (six) hours as needed for mild pain, moderate pain, fever or headache.    . alendronate (FOSAMAX) 70 MG tablet TAKE 1 TABLET (70 MG TOTAL) EVERY 7 (SEVEN) DAYS. TAKE WITH A FULL GLASS OF WATER ON AN EMPTY STOMACH. (Patient taking differently: Take 70 mg by mouth every Monday. ) 12 tablet 1  . allopurinol (ZYLOPRIM) 300 MG tablet TAKE 1 TABLET EVERY DAY 90 tablet 1  . amLODipine (NORVASC) 10 MG tablet TAKE 1 TABLET EVERY DAY 90 tablet 1  . aspirin EC 81 MG tablet Take 1 tablet (81 mg total) by mouth daily. 30 tablet 3  . atenolol (TENORMIN) 25 MG tablet TAKE 1 TABLET EVERY DAY (Patient taking differently: Take 25 mg by mouth daily. ) 90 tablet 1  . atorvastatin (LIPITOR) 40 MG tablet TAKE 1 TABLET EVERY DAY 90 tablet 0  . fulvestrant (FASLODEX) 250 MG/5ML injection Inject 500 mg into the muscle once. One injection each buttock over 1-2 minutes. Warm prior to use.    . levothyroxine (SYNTHROID) 112 MCG tablet TAKE 1 TABLET BY MOUTH EVERY DAY (Patient taking differently: Take 112 mcg by mouth daily before breakfast. ) 90 tablet  3  . lisinopril (ZESTRIL) 2.5 MG tablet TAKE 1 TABLET DAILY TO CONTROL BLOOD PRESSURE 90 tablet 1  . magnesium hydroxide (MILK OF MAGNESIA) 400 MG/5ML suspension Take 5-10 mLs by mouth daily as needed for mild constipation.    . sertraline (ZOLOFT) 50 MG tablet TAKE ONE TABLET DAILY FOR DEPRESSION 90 tablet 1  . HYDROcodone-acetaminophen (NORCO/VICODIN) 5-325 MG tablet Take 0.5-1 tablets by mouth every 6 (six) hours as needed for severe pain. (Patient not taking: Reported on 09/21/2020) 10 tablet 0   No current facility-administered medications for this visit.    OBJECTIVE: African-American woman examined in a wheelchair Vitals:   09/21/20 1448  BP: (!) 141/60  Pulse: 64  Resp: 18  Temp: 97.8 F (36.6 C)  SpO2: 100%     There is no height or weight on file to calculate BMI.    ECOG FS:2 - Symptomatic, <50% confined to bed GENERAL: Patient is a well appearing female  in no acute distress HEENT:  Sclerae anicteric.  Mask in place.  Neck is supple.  NODES:  No cervical, supraclavicular, or axillary lymphadenopathy palpated.  BREAST EXAM:left chest wall s/p radiation, difficult to palpate any nodules.  LUNGS:  Clear to auscultation bilaterally.  No wheezes or rhonchi. HEART:  Regular rate and rhythm. No murmur appreciated. ABDOMEN:  Soft, nontender.  Positive, normoactive bowel sounds. No organomegaly palpated. MSK:  No focal spinal tenderness to palpation. Full range of motion bilaterally in the upper extremities. EXTREMITIES:  No peripheral edema.   SKIN:  Clear with no obvious rashes or skin changes. No nail dyscrasia. NEURO:  Nonfocal. Well oriented.  Appropriate affect.      LAB RESULTS:  CMP     Component Value Date/Time   NA 140 05/23/2020 1454   NA 143 06/01/2016 0958   K 3.5 05/23/2020 1454   CL 108 05/23/2020 1454   CO2 24 05/23/2020 1454   GLUCOSE 84 05/23/2020 1454   BUN 16 05/23/2020 1454   BUN 21 06/01/2016 0958   CREATININE 1.00 05/23/2020 1454   CREATININE  1.13 (H) 02/04/2020 1019   CREATININE 0.90 (H) 01/18/2020 1125   CALCIUM 9.3 05/23/2020 1454   PROT 7.0 05/23/2020 1454   PROT 6.8 11/09/2015 1013   ALBUMIN 3.2 (L) 05/23/2020 1454   ALBUMIN 4.1 11/09/2015 1013   AST 25 05/23/2020 1454   AST 29 02/04/2020 1019   ALT 20 05/23/2020 1454   ALT 27 02/04/2020 1019   ALKPHOS 74 05/23/2020 1454   BILITOT 0.6 05/23/2020 1454   BILITOT 0.6 02/04/2020 1019   GFRNONAA 49 (L) 05/23/2020 1454   GFRNONAA 42 (L) 02/04/2020 1019   GFRNONAA 51 (L) 02/25/2018 0937   GFRAA 57 (L) 05/23/2020 1454   GFRAA 49 (L) 02/04/2020 1019   GFRAA 59 (L) 02/25/2018 0937    INo results found for: SPEP, UPEP  Lab Results  Component Value Date   WBC 4.1 09/21/2020   NEUTROABS 2.5 09/21/2020   HGB 11.3 (L) 09/21/2020   HCT 36.4 09/21/2020   MCV 95.5 09/21/2020   PLT 175 09/21/2020      Chemistry      Component Value Date/Time   NA 140 05/23/2020 1454   NA 143 06/01/2016 0958   K 3.5 05/23/2020 1454   CL 108 05/23/2020 1454   CO2 24 05/23/2020 1454   BUN 16 05/23/2020 1454   BUN 21 06/01/2016 0958   CREATININE 1.00 05/23/2020 1454   CREATININE 1.13 (H) 02/04/2020 1019   CREATININE 0.90 (H) 01/18/2020 1125      Component Value Date/Time   CALCIUM 9.3 05/23/2020 1454   ALKPHOS 74 05/23/2020 1454   AST 25 05/23/2020 1454   AST 29 02/04/2020 1019   ALT 20 05/23/2020 1454   ALT 27 02/04/2020 1019   BILITOT 0.6 05/23/2020 1454   BILITOT 0.6 02/04/2020 1019       No results found for: LABCA2  No components found for: LABCA125  No results for input(s): INR in the last 168 hours.  Urinalysis    Component Value Date/Time   COLORURINE YELLOW 05/23/2020 [place] 05/23/2020 1454   LABSPEC 1.016 05/23/2020 1454   PHURINE 6.0 05/23/2020 [place] 05/23/2020 [place] 05/23/2020 [place] 05/23/2020 [place] 05/23/2020 [place] 05/23/2020 1454    UROBILINOGEN 0.2 03/05/2014 2055   NITRITE NEGATIVE 05/23/2020 1454  LEUKOCYTESUR SMALL (A) 05/23/2020 1454    STUDIES: No results found.   ELIGIBLE FOR AVAILABLE RESEARCH PROTOCOL: no   ASSESSMENT: 86 y.o. [place] woman status post left breast upper outer quadrant biopsy 05/21/2016 for ductal carcinoma in situ, high-grade, estrogen and progesterone receptor positive  (1) opted against surgery   (2) started anastrozole neoadjuvantly 06/13/2016, discontinued June 2018 due to osteoporosis concerns  (a)  bone density 11/16/2016 shows a T score of -4.7 (osteoporosis)-on fosamax.   (3) mammography/ultrasonography 06/10/2018 shows evidence of disease progression, possible lymph node extension  (4) letrozole started 06/27/2018   (a) discontinued November 2019 with evidence of progression  (b) restarted 09/2019, to change to Exemestane in 12/2019  (5) fulvestrant started 11/21/2018, discontinued 03/12/2019  (a) mammography and ultrasonography 07/24/2019 shows evidence of disease progression  (6) Left modified radical mastectomy on 09/10/2019: showing a stage IIIA, T2, N2a, invasive ductal carcinoma, grade 3, margins negative, estrogen receptor positive, progesterone receptor and HER-2 negative, with an MIB-1 of 90%  (a) 4 out of 14 lymph nodes positive for macrometastases  (b) 1 out of 14 lymph nodes positive for micrometastases  (c) CT chest on 08/26/2019 negative for metastatic disease  (7) Adjuvant radiation recommended on 09/24/2019 and again on10/09/2019, declined by patient  (8) removal of a left chest wall lesion 02/19/2020 showing metastatic breast cancer, estrogen receptor 95% and strongly positive, progesterone receptor negative, HER-2 negative with an MIB-1 of 50%  (a) patient and sister again opted against adjuvant radiation at 03/07/2020 visit  (b) additional lesions noted clincially 07/07/2020  (c) adjuvant radiation to left chest wall from 08/09/2020 through  08/22/2020  (9) exemestane started December 2020 through 07/2020 due to progression  (b) began Fulvestrant on 08/17/2020     PLAN:  Shelia Marks is doing well today.  She continues on treatment with fulvestrant every four weeks and is tolerating this well.  She willc ontinue this.  She has no clinical signs of progression.  I reviewed with her sister Shelia Marks that her cancer hadn't spread outside of her local recurrence.  With her dementia worsening, we will likely re scan in the future, with a CT chest, however, if the family decides they do not want to do further imaging should her dementia become difficult, that is understandable to.   I recommended Alexiana apply vitamin e oil to the chest wall daily to help with the skin changes and healing of the skin.  I did reach out to [employer] our social worker to try to see if she can get her any assistance at home.  We will see her back in 4 weeks for labs, f/u, and her next injection.  She knows to call for any questions that may arise between now and her next appointment.  We are happy to see her sooner if needed.   Total encounter time 20 minutes.Wilber Bihari, NP 09/21/20 3:13 PM Medical Oncology and Hematology [facility] [place], [place] [address] Tel. [phone]    Fax. [phone]    *Total Encounter Time as defined by the Centers for Medicare and Medicaid Services includes, in addition to the face-to-face time of a patient visit (documented in the note above) non-face-to-face time: obtaining and reviewing outside history, ordering and reviewing medications, tests or procedures, care coordination (communications with other health care professionals or caregivers) and documentation in the medical record.

## 2020-09-21 ENCOUNTER — Ambulatory Visit: Payer: Medicare Other

## 2020-09-21 ENCOUNTER — Other Ambulatory Visit: Payer: Medicare Other

## 2020-09-21 ENCOUNTER — Inpatient Hospital Stay: Payer: Medicare Other

## 2020-09-21 ENCOUNTER — Telehealth: Payer: Self-pay | Admitting: Licensed Clinical Social Worker

## 2020-09-21 ENCOUNTER — Other Ambulatory Visit: Payer: Self-pay

## 2020-09-21 ENCOUNTER — Inpatient Hospital Stay: Payer: Medicare Other | Attending: Oncology | Admitting: Adult Health

## 2020-09-21 ENCOUNTER — Encounter: Payer: Self-pay | Admitting: Adult Health

## 2020-09-21 VITALS — BP 141/60 | HR 64 | Temp 97.8°F | Resp 18

## 2020-09-21 DIAGNOSIS — C50812 Malignant neoplasm of overlapping sites of left female breast: Secondary | ICD-10-CM

## 2020-09-21 DIAGNOSIS — E039 Hypothyroidism, unspecified: Secondary | ICD-10-CM | POA: Diagnosis not present

## 2020-09-21 DIAGNOSIS — Z170 Estrogen receptor positive status [ER+]: Secondary | ICD-10-CM

## 2020-09-21 DIAGNOSIS — D0512 Intraductal carcinoma in situ of left breast: Secondary | ICD-10-CM

## 2020-09-21 DIAGNOSIS — I6350 Cerebral infarction due to unspecified occlusion or stenosis of unspecified cerebral artery: Secondary | ICD-10-CM

## 2020-09-21 DIAGNOSIS — C7989 Secondary malignant neoplasm of other specified sites: Secondary | ICD-10-CM

## 2020-09-21 DIAGNOSIS — D631 Anemia in chronic kidney disease: Secondary | ICD-10-CM

## 2020-09-21 DIAGNOSIS — Z5111 Encounter for antineoplastic chemotherapy: Secondary | ICD-10-CM | POA: Insufficient documentation

## 2020-09-21 DIAGNOSIS — C50412 Malignant neoplasm of upper-outer quadrant of left female breast: Secondary | ICD-10-CM | POA: Diagnosis not present

## 2020-09-21 DIAGNOSIS — C50912 Malignant neoplasm of unspecified site of left female breast: Secondary | ICD-10-CM

## 2020-09-21 DIAGNOSIS — N183 Chronic kidney disease, stage 3 unspecified: Secondary | ICD-10-CM

## 2020-09-21 LAB — CBC WITH DIFFERENTIAL/PLATELET
Abs Immature Granulocytes: 0.02 10*3/uL (ref 0.00–0.07)
Basophils Absolute: 0 10*3/uL (ref 0.0–0.1)
Basophils Relative: 1 %
Eosinophils Absolute: 0.1 10*3/uL (ref 0.0–0.5)
Eosinophils Relative: 2 %
HCT: 36.4 % (ref 36.0–46.0)
Hemoglobin: 11.3 g/dL — ABNORMAL LOW (ref 12.0–15.0)
Immature Granulocytes: 1 %
Lymphocytes Relative: 27 %
Lymphs Abs: 1.1 10*3/uL (ref 0.7–4.0)
MCH: 29.7 pg (ref 26.0–34.0)
MCHC: 31 g/dL (ref 30.0–36.0)
MCV: 95.5 fL (ref 80.0–100.0)
Monocytes Absolute: 0.3 10*3/uL (ref 0.1–1.0)
Monocytes Relative: 8 %
Neutro Abs: 2.5 10*3/uL (ref 1.7–7.7)
Neutrophils Relative %: 61 %
Platelets: 175 10*3/uL (ref 150–400)
RBC: 3.81 MIL/uL — ABNORMAL LOW (ref 3.87–5.11)
RDW: 15.1 % (ref 11.5–15.5)
WBC: 4.1 10*3/uL (ref 4.0–10.5)
nRBC: 0 % (ref 0.0–0.2)

## 2020-09-21 MED ORDER — FULVESTRANT 250 MG/5ML IM SOLN
INTRAMUSCULAR | Status: AC
  Filled 2020-09-21: qty 10

## 2020-09-21 MED ORDER — FULVESTRANT 250 MG/5ML IM SOLN
500.0000 mg | Freq: Once | INTRAMUSCULAR | Status: AC
  Administered 2020-09-21: 500 mg via INTRAMUSCULAR

## 2020-09-21 NOTE — Patient Instructions (Signed)
Fulvestrant injection What is this medicine? FULVESTRANT (ful VES trant) blocks the effects of estrogen. It is used to treat breast cancer. This medicine may be used for other purposes; ask your health care provider or pharmacist if you have questions. COMMON BRAND NAME(S): FASLODEX What should I tell my health care provider before I take this medicine? They need to know if you have any of these conditions:  bleeding disorders  liver disease  low blood counts, like low white cell, platelet, or red cell counts  an unusual or allergic reaction to fulvestrant, other medicines, foods, dyes, or preservatives  pregnant or trying to get pregnant  breast-feeding How should I use this medicine? This medicine is for injection into a muscle. It is usually given by a health care professional in a hospital or clinic setting. Talk to your pediatrician regarding the use of this medicine in children. Special care may be needed. Overdosage: If you think you have taken too much of this medicine contact a poison control center or emergency room at once. NOTE: This medicine is only for you. Do not share this medicine with others. What if I miss a dose? It is important not to miss your dose. Call your doctor or health care professional if you are unable to keep an appointment. What may interact with this medicine?  medicines that treat or prevent blood clots like warfarin, enoxaparin, dalteparin, apixaban, dabigatran, and rivaroxaban This list may not describe all possible interactions. Give your health care provider a list of all the medicines, herbs, non-prescription drugs, or dietary supplements you use. Also tell them if you smoke, drink alcohol, or use illegal drugs. Some items may interact with your medicine. What should I watch for while using this medicine? Your condition will be monitored carefully while you are receiving this medicine. You will need important blood work done while you are taking  this medicine. Do not become pregnant while taking this medicine or for at least 1 year after stopping it. Women of child-bearing potential will need to have a negative pregnancy test before starting this medicine. Women should inform their doctor if they wish to become pregnant or think they might be pregnant. There is a potential for serious side effects to an unborn child. Men should inform their doctors if they wish to father a child. This medicine may lower sperm counts. Talk to your health care professional or pharmacist for more information. Do not breast-feed an infant while taking this medicine or for 1 year after the last dose. What side effects may I notice from receiving this medicine? Side effects that you should report to your doctor or health care professional as soon as possible:  allergic reactions like skin rash, itching or hives, swelling of the face, lips, or tongue  feeling faint or lightheaded, falls  pain, tingling, numbness, or weakness in the legs  signs and symptoms of infection like fever or chills; cough; flu-like symptoms; sore throat  vaginal bleeding Side effects that usually do not require medical attention (report to your doctor or health care professional if they continue or are bothersome):  aches, pains  constipation  diarrhea  headache  hot flashes  nausea, vomiting  pain at site where injected  stomach pain This list may not describe all possible side effects. Call your doctor for medical advice about side effects. You may report side effects to FDA at 1-800-FDA-1088. Where should I keep my medicine? This drug is given in a hospital or clinic and will   not be stored at home. NOTE: This sheet is a summary. It may not cover all possible information. If you have questions about this medicine, talk to your doctor, pharmacist, or health care provider.  2020 Elsevier/Gold Standard (2018-03-27 11:34:41)  

## 2020-09-21 NOTE — Telephone Encounter (Signed)
[facility] Clinical Social Work  Received request from NP [employer] for information on in-home aide for patient. Attempted to call sister, [name]. No answer. Left VM with contact information.  It appears in the chart that patient had in home aide as of 8/23 through [facility].   Edwinna Areola Tyshawn Ciullo, LCSW

## 2020-09-22 ENCOUNTER — Telehealth: Payer: Self-pay | Admitting: Radiation Oncology

## 2020-09-22 ENCOUNTER — Telehealth: Payer: Self-pay | Admitting: Adult Health

## 2020-09-22 ENCOUNTER — Other Ambulatory Visit: Payer: Self-pay | Admitting: Internal Medicine

## 2020-09-22 ENCOUNTER — Encounter: Payer: Self-pay | Admitting: Licensed Clinical Social Worker

## 2020-09-22 ENCOUNTER — Ambulatory Visit: Payer: Medicare Other | Admitting: Internal Medicine

## 2020-09-22 NOTE — Progress Notes (Signed)
[facility] CSW Progress Note  Clinical Education officer, museum contacted caregiver by phone to determine resource needs. Per sister, [name], need help in the home with ADLs for pt. Previously had in-home aide through [facility] and were very happy with her but she had to leave. Sister open to re-referral to [facility] as well as any other agency that provides in-home aides. CSW placed referrals through [facility].    Edwinna Areola Thelma Lorenzetti , LCSW

## 2020-09-22 NOTE — Telephone Encounter (Signed)
  Radiation Oncology         [phone]) [phone] ________________________________  Name: Shelia Marks MRN: [id number]  Date of Service: 09/22/2020  DOB: Aug 19, 1926  Post Treatment Telephone Note  Diagnosis:   Stage IIIA, pT2N2aM0 grade 3, ER positive invasive ductal carcinoma of the left breast.  Interval Since Last Radiation: 4 weeks   08/09/20 - 08/22/20: The patient was treated to the left chest wall to the affected area to a dose of 30 Gray in 10 fractions at 3 Gy per fraction.  This was accomplished using a 4 field 3D conformal technique.  Narrative:  The patient was contacted today for routine follow-up. During treatment she did very well with radiotherapy and did not have significant desquamation. [name] is contacted due to the patient's dementia. Apparently she's doing well. There is still some discoloration of the skin but no concerns of breakdown.  Impression/Plan: 1. Stage IIIA, pT2N2aM0 grade 3, ER positive invasive ductal carcinoma of the left breast. The patient has been doing well since completion of radiotherapy per her sister and per notes. We discussed that we would be happy to continue to follow her as needed, but she will also continue to follow up with Dr. Jana Hakim in medical oncology. Her sister was counseled on skin care as well as measures to avoid sun exposure to this area.      Carola Rhine, PAC

## 2020-09-22 NOTE — Telephone Encounter (Signed)
Scheduled appts per 9/22 los. Pt's sister confirmed appt dates and time.

## 2020-09-27 DIAGNOSIS — C50912 Malignant neoplasm of unspecified site of left female breast: Secondary | ICD-10-CM | POA: Diagnosis not present

## 2020-10-03 ENCOUNTER — Ambulatory Visit (INDEPENDENT_AMBULATORY_CARE_PROVIDER_SITE_OTHER): Payer: Medicare Other | Admitting: Internal Medicine

## 2020-10-03 ENCOUNTER — Other Ambulatory Visit: Payer: Self-pay

## 2020-10-03 ENCOUNTER — Encounter: Payer: Self-pay | Admitting: Internal Medicine

## 2020-10-03 VITALS — BP 138/78 | HR 63 | Temp 97.8°F | Ht 61.0 in | Wt 145.2 lb

## 2020-10-03 DIAGNOSIS — Z170 Estrogen receptor positive status [ER+]: Secondary | ICD-10-CM | POA: Diagnosis not present

## 2020-10-03 DIAGNOSIS — F320 Major depressive disorder, single episode, mild: Secondary | ICD-10-CM | POA: Diagnosis not present

## 2020-10-03 DIAGNOSIS — C50812 Malignant neoplasm of overlapping sites of left female breast: Secondary | ICD-10-CM | POA: Diagnosis not present

## 2020-10-03 DIAGNOSIS — E890 Postprocedural hypothyroidism: Secondary | ICD-10-CM

## 2020-10-03 DIAGNOSIS — H9113 Presbycusis, bilateral: Secondary | ICD-10-CM | POA: Diagnosis not present

## 2020-10-03 DIAGNOSIS — Z9012 Acquired absence of left breast and nipple: Secondary | ICD-10-CM | POA: Diagnosis not present

## 2020-10-03 DIAGNOSIS — I6350 Cerebral infarction due to unspecified occlusion or stenosis of unspecified cerebral artery: Secondary | ICD-10-CM

## 2020-10-03 DIAGNOSIS — Z23 Encounter for immunization: Secondary | ICD-10-CM

## 2020-10-03 DIAGNOSIS — F0150 Vascular dementia without behavioral disturbance: Secondary | ICD-10-CM | POA: Diagnosis not present

## 2020-10-03 NOTE — Progress Notes (Signed)
Location:  [facility] clinic Provider:  Latoshia Monrroy L. Mariea Clonts, D.O., C.M.D.  Code Status: DNR Goals of Care:  Advanced Directives 10/03/2020  Does Patient Have a Medical Advance Directive? -  Type of Paramedic of [place];Out of facility DNR (pink MOST or yellow form)  Does patient want to make changes to medical advance directive? -  Copy of [place] in Chart? Yes - validated most recent copy scanned in chart (See row information)  Would patient like information on creating a medical advance directive? -  Pre-existing out of facility DNR order (yellow form or pink MOST form) Pink MOST/Yellow Form most recent copy in chart - Physician notified to receive inpatient order     Chief Complaint  Patient presents with  . Medical Management of Chronic Issues    4 month follow up   . Acute Visit    Right arm has been sore since she recieved the Covid Vac in [place]   . Health Maintenance    Influenza     HPI: Patient is a 86 y.o. female seen today for medical management of chronic diseases.    Shelia Marks continues to decline as far as her dementia.  She has days were Shelia Marks reports "the zoloft isn't working" which seems to mean she's grumpy and unmotivated.  She likes to repeat about being old and things falling apart.  She's bothered by the dark color on her breast after her radiation.  She aches from taking the fulvestrant injections.    She sleeps well and still eats fairly well.  She's down 12 lbs since May.    Shelia Marks is more frustrated all the time and has stress from helping her sister.  They cannot afford expensive CNAs in the home--card provided for one agency to consider.  She did not say anything about SNF placement or memory care.  Past Medical History:  Diagnosis Date  . Alzheimer's disease ([place])   . Cancer ([place])   . Disorder of bone and cartilage, unspecified   . Dizziness and giddiness   . Hyperlipidemia LDL goal < 100   . Hypertension     . Hypothyroidism   . Malignant neoplasm of thyroid gland ([place])   . Nontoxic uninodular goiter   . Other malaise and fatigue   . Other symptoms involving cardiovascular system   . Phlebitis and thrombophlebitis of unspecified site   . Senile osteoporosis   . Sleep related leg cramps   . Stroke ([place])   . Unspecified disorder of lipoid metabolism   . Unspecified hereditary and idiopathic peripheral neuropathy   . Varicose veins of lower extremities with inflammation     Past Surgical History:  Procedure Laterality Date  . ABDOMINAL HYSTERECTOMY     DR HUGES  . BREAST BIOPSY Left 05/21/2016   malignant  . CATRACT SURGERY  2008  . COLON SURGERY     clipped polyp during colonoscopy  . EXCISION OF KELOID Left 02/19/2020   Procedure: EXCISION NODULE LEFT CHEST WALL;  Surgeon: Jovita Kussmaul, MD;  Location: [facility];  Service: General;  Laterality: Left;  . KNEE SURGERY     right  . MASTECTOMY MODIFIED RADICAL Left 09/10/2019   Procedure: LEFT MASTECTOMY MODIFIED RADICAL;  Surgeon: Jovita Kussmaul, MD;  Location: [facility];  Service: General;  Laterality: Left;  . PARATHYROID ADENOMA REMOVED  1983  . THYROID SURGERY  July 20, 2011    No Known Allergies  Outpatient Encounter Medications as  of 10/03/2020  Medication Sig  . acetaminophen (TYLENOL) 325 MG tablet Take 2 tablets (650 mg total) by mouth every 6 (six) hours as needed for mild pain, moderate pain, fever or headache.  . alendronate (FOSAMAX) 70 MG tablet TAKE 1 TABLET (70 MG TOTAL) EVERY 7 (SEVEN) DAYS. TAKE WITH A FULL GLASS OF WATER ON AN EMPTY STOMACH.  Marland Kitchen allopurinol (ZYLOPRIM) 300 MG tablet TAKE 1 TABLET EVERY DAY  . amLODipine (NORVASC) 10 MG tablet TAKE 1 TABLET EVERY DAY  . aspirin EC 81 MG tablet Take 1 tablet (81 mg total) by mouth daily.  Marland Kitchen atenolol (TENORMIN) 25 MG tablet TAKE 1 TABLET EVERY DAY  . atorvastatin (LIPITOR) 40 MG tablet TAKE 1 TABLET EVERY DAY  . fulvestrant (FASLODEX) 250 MG/5ML injection  Inject 500 mg into the muscle once. One injection each buttock over 1-2 minutes. Warm prior to use.  . levothyroxine (SYNTHROID) 112 MCG tablet TAKE 1 TABLET BY MOUTH EVERY DAY  . lisinopril (ZESTRIL) 2.5 MG tablet TAKE 1 TABLET DAILY TO CONTROL BLOOD PRESSURE  . magnesium hydroxide (MILK OF MAGNESIA) 400 MG/5ML suspension Take 5-10 mLs by mouth daily as needed for mild constipation.  . sertraline (ZOLOFT) 50 MG tablet TAKE ONE TABLET DAILY FOR DEPRESSION  . [DISCONTINUED] HYDROcodone-acetaminophen (NORCO/VICODIN) 5-325 MG tablet Take 0.5-1 tablets by mouth every 6 (six) hours as needed for severe pain. (Patient not taking: Reported on 09/21/2020)  . [DISCONTINUED] iron polysaccharides (NIFEREX) 150 MG capsule Take 1 capsule (150 mg total) by mouth every other day. (Patient not taking: Reported on 05/15/2020)   No facility-administered encounter medications on file as of 10/03/2020.    Review of Systems:  Review of Systems  Constitutional: Positive for malaise/fatigue and weight loss. Negative for chills and fever.  HENT: Negative for congestion and sore throat.   Eyes: Negative for blurred vision.  Respiratory: Negative for shortness of breath.   Cardiovascular: Negative for chest pain, palpitations and leg swelling.  Gastrointestinal: Negative for abdominal pain and constipation.  Genitourinary: Negative for dysuria.  Musculoskeletal: Positive for joint pain and myalgias. Negative for falls.  Neurological: Positive for weakness. Negative for dizziness and loss of consciousness.       Hemiparesis  Psychiatric/Behavioral: Positive for depression and memory loss. The patient is not nervous/anxious and does not have insomnia.     Health Maintenance  Topic Date Due  . TETANUS/TDAP  06/23/2028  . INFLUENZA VACCINE  Completed  . DEXA SCAN  Completed  . COVID-19 Vaccine  Completed  . PNA vac Low Risk Adult  Completed    Physical Exam: Vitals:   10/03/20 1416  BP: 138/78  Pulse: 63    Temp: 97.8 F (36.6 C)  SpO2: 96%  Weight: 145 lb 3.2 oz (65.9 kg)  Height: 5\' 1"  (1.549 m)   Body mass index is 27.44 kg/m. Physical Exam Vitals reviewed.  Constitutional:      General: She is not in acute distress.    Appearance: Normal appearance. She is obese. She is not toxic-appearing.  HENT:     Head: Normocephalic and atraumatic.  Cardiovascular:     Rate and Rhythm: Normal rate and regular rhythm.     Pulses: Normal pulses.     Heart sounds: Normal heart sounds.  Pulmonary:     Effort: Pulmonary effort is normal.     Breath sounds: Normal breath sounds. No wheezing, rhonchi or rales.  Abdominal:     General: Bowel sounds are normal.  Musculoskeletal:  General: Normal range of motion.     Right lower leg: No edema.     Left lower leg: No edema.     Comments: Moving slower   Neurological:     Mental Status: She is alert.     Gait: Gait abnormal.     Comments: Uses walker  Psychiatric:     Comments: More down today, but no increase in zoloft desired      Labs reviewed: Basic Metabolic Panel: Recent Labs    01/18/20 1125 02/04/20 1019 05/23/20 1454  NA 143 140 140  K 3.3* 3.9 3.5  CL 106 105 108  CO2 28 27 24   GLUCOSE 91 99 84  BUN 13 21 16   CREATININE 0.90* 1.13* 1.00  CALCIUM 9.5 9.8 9.3   Liver Function Tests: Recent Labs    11/04/19 1145 02/04/20 1019 05/23/20 1454  AST 38 29 25  ALT 34 27 20  ALKPHOS 65 61 74  BILITOT 0.5 0.6 0.6  PROT 7.4 7.4 7.0  ALBUMIN 3.6 3.9 3.2*   No results for input(s): LIPASE, AMYLASE in the last 8760 hours. No results for input(s): AMMONIA in the last 8760 hours. CBC: Recent Labs    08/02/20 1336 08/17/20 1141 09/21/20 1406  WBC 4.3 3.4* 4.1  NEUTROABS 2.6 2.2 2.5  HGB 11.4* 11.0* 11.3*  HCT 36.4 35.0* 36.4  MCV 92.9 95.1 95.5  PLT 199 195 175   Lipid Panel: No results for input(s): CHOL, HDL, LDLCALC, TRIG, CHOLHDL, LDLDIRECT in the last 8760 hours. Lab Results  Component Value Date    HGBA1C 5.0 11/09/2015    Procedures since last visit: No results found.  Assessment/Plan 1. Vascular dementia without behavioral disturbance ([place]) -progressing and getting more challenging for Shelia Marks to manage -given card for Shelia Marks's group for caregiver assistance, but cost may be prohibitive--asked Shelia Marks to call if she needs anything else - CBC with Differential/Platelet - COMPLETE METABOLIC PANEL WITH GFR - TSH  2. Presbycusis of both ears - agrees today to testing  - Ambulatory referral to Audiology  3. Need for influenza vaccination - Flu Vaccine QUAD High Dose(Fluad)  4. Malignant neoplasm of overlapping sites of left breast in female, estrogen receptor positive ([place]) -continues on chemo per oncology   5. S/P mastectomy, left -also had XRT  6. Depression, major, single episode, mild (HCC) -cont zoloft as is--offered increase, but declined at this time   7. Hypothyroidism, postsurgical - continue levothyroxine therapy - CBC with Differential/Platelet - COMPLETE METABOLIC PANEL WITH GFR - TSH  Labs/tests ordered:   Lab Orders     CBC with Differential/Platelet     COMPLETE METABOLIC PANEL WITH GFR     TSH  Next appt:  11/04/2020--AWV, f/u in 4 mos med mgt no labs   Shelia Marks L. Shelia Marks, D.O. [facility] [address], [place] [address] Cell Phone (Mon-Fri 8am-5pm):  [phone] On Call:  [phone] & follow prompts after 5pm & weekends Office Phone:  [phone] Office Fax:  [phone]

## 2020-10-03 NOTE — Patient Instructions (Addendum)
[place] will provide third COVID-19 Vaccine: Booster shots will be at all [place] community vaccination clinics at [email or web address] You may visit that website or call [phone] Monday thru Friday from 7am to 7pm.     

## 2020-10-04 LAB — CBC WITH DIFFERENTIAL/PLATELET
Absolute Monocytes: 340 cells/uL (ref 200–950)
Basophils Absolute: 32 cells/uL (ref 0–200)
Basophils Relative: 0.8 %
Eosinophils Absolute: 80 cells/uL (ref 15–500)
Eosinophils Relative: 2 %
HCT: 36.9 % (ref 35.0–45.0)
Hemoglobin: 11.6 g/dL — ABNORMAL LOW (ref 11.7–15.5)
Lymphs Abs: 1000 cells/uL (ref 850–3900)
MCH: 29.5 pg (ref 27.0–33.0)
MCHC: 31.4 g/dL — ABNORMAL LOW (ref 32.0–36.0)
MCV: 93.9 fL (ref 80.0–100.0)
MPV: 12.9 fL — ABNORMAL HIGH (ref 7.5–12.5)
Monocytes Relative: 8.5 %
Neutro Abs: 2548 cells/uL (ref 1500–7800)
Neutrophils Relative %: 63.7 %
Platelets: 206 10*3/uL (ref 140–400)
RBC: 3.93 10*6/uL (ref 3.80–5.10)
RDW: 14.1 % (ref 11.0–15.0)
Total Lymphocyte: 25 %
WBC: 4 10*3/uL (ref 3.8–10.8)

## 2020-10-04 LAB — COMPLETE METABOLIC PANEL WITH GFR
AG Ratio: 1.3 (calc) (ref 1.0–2.5)
ALT: 27 U/L (ref 6–29)
AST: 35 U/L (ref 10–35)
Albumin: 4 g/dL (ref 3.6–5.1)
Alkaline phosphatase (APISO): 67 U/L (ref 37–153)
BUN/Creatinine Ratio: 19 (calc) (ref 6–22)
BUN: 22 mg/dL (ref 7–25)
CO2: 27 mmol/L (ref 20–32)
Calcium: 10.2 mg/dL (ref 8.6–10.4)
Chloride: 107 mmol/L (ref 98–110)
Creat: 1.13 mg/dL — ABNORMAL HIGH (ref 0.60–0.88)
GFR, Est African American: 49 mL/min/{1.73_m2} — ABNORMAL LOW (ref >=60)
GFR, Est Non African American: 42 mL/min/{1.73_m2} — ABNORMAL LOW (ref >=60)
Globulin: 3.2 g/dL (calc) (ref 1.9–3.7)
Glucose, Bld: 88 mg/dL (ref 65–139)
Potassium: 3.6 mmol/L (ref 3.5–5.3)
Sodium: 143 mmol/L (ref 135–146)
Total Bilirubin: 0.5 mg/dL (ref 0.2–1.2)
Total Protein: 7.2 g/dL (ref 6.1–8.1)

## 2020-10-04 LAB — TSH: TSH: 2.5 mIU/L (ref 0.40–4.50)

## 2020-10-04 NOTE — Progress Notes (Signed)
Anemia is a little bit better. Thyroid is in normal range. Kidney test indicates she's not drinking enough water which is not new.

## 2020-10-10 ENCOUNTER — Other Ambulatory Visit: Payer: Self-pay | Admitting: Internal Medicine

## 2020-10-13 ENCOUNTER — Other Ambulatory Visit: Payer: Self-pay

## 2020-10-13 ENCOUNTER — Ambulatory Visit: Payer: Medicare Other | Attending: Internal Medicine | Admitting: Audiology

## 2020-10-13 DIAGNOSIS — H903 Sensorineural hearing loss, bilateral: Secondary | ICD-10-CM | POA: Diagnosis not present

## 2020-10-13 NOTE — Procedures (Signed)
°  Outpatient Audiology and [place], [place]  [id number] [phone]  AUDIOLOGICAL  EVALUATION  NAME: Shelia Marks     DOB:   06-01-1926      MRN: [id number]                                                                                     DATE: 10/13/2020     REFERENT: Gayland Curry, DO STATUS: Outpatient DIAGNOSIS: Sensorineural Hearing loss, bilateral    History: [name] was seen for an audiological evaluation due to decreased hearing occurring over the past few years. [name] was accompanied to the appointment by her sister. She denies otalgia, tinnitus, aural fullness. [name] reports a history of dizziness. [name] reports increased difficulty in hearing and communicating in her everyday listening environments.   Evaluation:   Otoscopy showed a clear view of the tympanic membranes, bilaterally  Tympanometry results were consistent with normal middle ear pressure and normal tympanic membrane mobility, bilaterally.   Audiometric testing was completed using Conventional Audiometry techniques with insert earphones and TDH headphones. Test results are consistent with a moderate to profound sensorineural hearing loss in the left ear and a moderately-severe to profound sensorineural hearing loss in the right ear. A Speech Recognition Thresholds were obtained at 55 dB HL in the right ear and at 50 dB HL in the left ear. Word Recognition Testing using NU-6 word lists was completed at 90 dB HL and [name] scored 76%, bilaterally.   Results:  Today's test results are consistent with a moderate to profound sensorineural hearing loss in the left ear and a moderately-severe to profound sensorineural hearing loss in the right ear. [name] will have communication difficulties in every listening environment. She will benefit form the use of good communication strategies, amplification, and assistive listening technology.  The test results  were reviewed with [name] and her sister.   Recommendations: 1. Referral to an Ear, Nose, and Throat Physician due to slight asymmetry 2. Communication Needs Assessment with an Audiologist to discuss hearing aids, personal amplification devices, and assistive hearing technology.     Bari Mantis Audiologist, Au.D., CCC-A 10/13/2020  2:49 PM  Cc: Gayland Curry, DO

## 2020-10-19 ENCOUNTER — Inpatient Hospital Stay (HOSPITAL_BASED_OUTPATIENT_CLINIC_OR_DEPARTMENT_OTHER): Payer: Medicare Other | Admitting: Medical

## 2020-10-19 ENCOUNTER — Ambulatory Visit: Payer: Medicare Other | Admitting: Adult Health

## 2020-10-19 ENCOUNTER — Other Ambulatory Visit: Payer: Self-pay

## 2020-10-19 ENCOUNTER — Inpatient Hospital Stay: Payer: Medicare Other | Attending: Oncology

## 2020-10-19 ENCOUNTER — Inpatient Hospital Stay: Payer: Medicare Other

## 2020-10-19 VITALS — BP 157/71 | HR 60 | Temp 97.4°F | Resp 18 | Ht 61.0 in | Wt 148.3 lb

## 2020-10-19 DIAGNOSIS — D631 Anemia in chronic kidney disease: Secondary | ICD-10-CM | POA: Insufficient documentation

## 2020-10-19 DIAGNOSIS — N183 Chronic kidney disease, stage 3 unspecified: Secondary | ICD-10-CM

## 2020-10-19 DIAGNOSIS — Z5111 Encounter for antineoplastic chemotherapy: Secondary | ICD-10-CM | POA: Insufficient documentation

## 2020-10-19 DIAGNOSIS — I6350 Cerebral infarction due to unspecified occlusion or stenosis of unspecified cerebral artery: Secondary | ICD-10-CM

## 2020-10-19 DIAGNOSIS — C50412 Malignant neoplasm of upper-outer quadrant of left female breast: Secondary | ICD-10-CM | POA: Insufficient documentation

## 2020-10-19 DIAGNOSIS — Z170 Estrogen receptor positive status [ER+]: Secondary | ICD-10-CM

## 2020-10-19 DIAGNOSIS — C50812 Malignant neoplasm of overlapping sites of left female breast: Secondary | ICD-10-CM

## 2020-10-19 DIAGNOSIS — C50912 Malignant neoplasm of unspecified site of left female breast: Secondary | ICD-10-CM | POA: Diagnosis not present

## 2020-10-19 DIAGNOSIS — C7989 Secondary malignant neoplasm of other specified sites: Secondary | ICD-10-CM | POA: Diagnosis not present

## 2020-10-19 LAB — CMP (CANCER CENTER ONLY)
ALT: 27 U/L (ref 0–44)
AST: 41 U/L (ref 15–41)
Albumin: 3.5 g/dL (ref 3.5–5.0)
Alkaline Phosphatase: 72 U/L (ref 38–126)
Anion gap: 8 (ref 5–15)
BUN: 15 mg/dL (ref 8–23)
CO2: 27 mmol/L (ref 22–32)
Calcium: 10 mg/dL (ref 8.9–10.3)
Chloride: 107 mmol/L (ref 98–111)
Creatinine: 0.98 mg/dL (ref 0.44–1.00)
GFR, Estimated: 50 mL/min — ABNORMAL LOW (ref >60)
Glucose, Bld: 89 mg/dL (ref 70–99)
Potassium: 3.9 mmol/L (ref 3.5–5.1)
Sodium: 142 mmol/L (ref 135–145)
Total Bilirubin: 0.7 mg/dL (ref 0.3–1.2)
Total Protein: 7.4 g/dL (ref 6.5–8.1)

## 2020-10-19 LAB — CBC WITH DIFFERENTIAL (CANCER CENTER ONLY)
Abs Immature Granulocytes: 0.01 10*3/uL (ref 0.00–0.07)
Basophils Absolute: 0 10*3/uL (ref 0.0–0.1)
Basophils Relative: 1 %
Eosinophils Absolute: 0.1 10*3/uL (ref 0.0–0.5)
Eosinophils Relative: 1 %
HCT: 35.6 % — ABNORMAL LOW (ref 36.0–46.0)
Hemoglobin: 11.3 g/dL — ABNORMAL LOW (ref 12.0–15.0)
Immature Granulocytes: 0 %
Lymphocytes Relative: 23 %
Lymphs Abs: 0.9 10*3/uL (ref 0.7–4.0)
MCH: 30.1 pg (ref 26.0–34.0)
MCHC: 31.7 g/dL (ref 30.0–36.0)
MCV: 94.7 fL (ref 80.0–100.0)
Monocytes Absolute: 0.3 10*3/uL (ref 0.1–1.0)
Monocytes Relative: 8 %
Neutro Abs: 2.6 10*3/uL (ref 1.7–7.7)
Neutrophils Relative %: 67 %
Platelet Count: 192 10*3/uL (ref 150–400)
RBC: 3.76 MIL/uL — ABNORMAL LOW (ref 3.87–5.11)
RDW: 14.7 % (ref 11.5–15.5)
WBC Count: 3.9 10*3/uL — ABNORMAL LOW (ref 4.0–10.5)
nRBC: 0 % (ref 0.0–0.2)

## 2020-10-19 MED ORDER — FULVESTRANT 250 MG/5ML IM SOLN
INTRAMUSCULAR | Status: AC
  Filled 2020-10-19: qty 10

## 2020-10-19 MED ORDER — FULVESTRANT 250 MG/5ML IM SOLN
500.0000 mg | Freq: Once | INTRAMUSCULAR | Status: AC
  Administered 2020-10-19: 500 mg via INTRAMUSCULAR

## 2020-10-19 NOTE — Progress Notes (Signed)
Pt seen by PA Lucianne Lei only, no assessment by [facility] RN at this time d/t time constraints.  PA aware.

## 2020-10-19 NOTE — Patient Instructions (Signed)
Fulvestrant injection What is this medicine? FULVESTRANT (ful VES trant) blocks the effects of estrogen. It is used to treat breast cancer. This medicine may be used for other purposes; ask your health care provider or pharmacist if you have questions. COMMON BRAND NAME(S): FASLODEX What should I tell my health care provider before I take this medicine? They need to know if you have any of these conditions:  bleeding disorders  liver disease  low blood counts, like low white cell, platelet, or red cell counts  an unusual or allergic reaction to fulvestrant, other medicines, foods, dyes, or preservatives  pregnant or trying to get pregnant  breast-feeding How should I use this medicine? This medicine is for injection into a muscle. It is usually given by a health care professional in a hospital or clinic setting. Talk to your pediatrician regarding the use of this medicine in children. Special care may be needed. Overdosage: If you think you have taken too much of this medicine contact a poison control center or emergency room at once. NOTE: This medicine is only for you. Do not share this medicine with others. What if I miss a dose? It is important not to miss your dose. Call your doctor or health care professional if you are unable to keep an appointment. What may interact with this medicine?  medicines that treat or prevent blood clots like warfarin, enoxaparin, dalteparin, apixaban, dabigatran, and rivaroxaban This list may not describe all possible interactions. Give your health care provider a list of all the medicines, herbs, non-prescription drugs, or dietary supplements you use. Also tell them if you smoke, drink alcohol, or use illegal drugs. Some items may interact with your medicine. What should I watch for while using this medicine? Your condition will be monitored carefully while you are receiving this medicine. You will need important blood work done while you are taking  this medicine. Do not become pregnant while taking this medicine or for at least 1 year after stopping it. Women of child-bearing potential will need to have a negative pregnancy test before starting this medicine. Women should inform their doctor if they wish to become pregnant or think they might be pregnant. There is a potential for serious side effects to an unborn child. Men should inform their doctors if they wish to father a child. This medicine may lower sperm counts. Talk to your health care professional or pharmacist for more information. Do not breast-feed an infant while taking this medicine or for 1 year after the last dose. What side effects may I notice from receiving this medicine? Side effects that you should report to your doctor or health care professional as soon as possible:  allergic reactions like skin rash, itching or hives, swelling of the face, lips, or tongue  feeling faint or lightheaded, falls  pain, tingling, numbness, or weakness in the legs  signs and symptoms of infection like fever or chills; cough; flu-like symptoms; sore throat  vaginal bleeding Side effects that usually do not require medical attention (report to your doctor or health care professional if they continue or are bothersome):  aches, pains  constipation  diarrhea  headache  hot flashes  nausea, vomiting  pain at site where injected  stomach pain This list may not describe all possible side effects. Call your doctor for medical advice about side effects. You may report side effects to FDA at 1-800-FDA-1088. Where should I keep my medicine? This drug is given in a hospital or clinic and will   not be stored at home. NOTE: This sheet is a summary. It may not cover all possible information. If you have questions about this medicine, talk to your doctor, pharmacist, or health care provider.  2020 Elsevier/Gold Standard (2018-03-27 11:34:41)  

## 2020-10-21 NOTE — Progress Notes (Signed)
Symptoms Management Clinic Progress Note   Shelia Marks [id number] 10/18/1926 86 y.o.  Shelia Marks is managed by Dr. Jana Hakim  Actively treated with chemotherapy/immunotherapy/hormonal therapy: yes  Current therapy:  Faslodex and Retacrit  Last treated: 09/21/2020 (Faslodex)  Next scheduled appointment with provider: 11/16/2020  Assessment: Plan:    Malignant neoplasm of overlapping sites of left breast in female, estrogen receptor positive ([place])  Chest wall recurrence of breast cancer, left ([place])  Anemia associated with stage 3 chronic renal failure ([place])   ER positive malignant neoplasm of the left breast with chest wall cutaneous recurrence: Shelia Marks will receive Faslodex today.  She is scheduled to return for follow-up on 11/16/2020.  Anemia associated with stage III chronic renal failure: A CBC returned today with a hemoglobin of 11.3.  The patient did not receive Retacrit today.  Please see After Visit Summary for patient specific instructions.  Future Appointments  Date Time Provider [place]  11/04/2020  9:00 AM Ngetich, Nelda Bucks, NP [facility] None  11/16/2020 12:00 PM [facility] None  11/16/2020 12:30 PM Magrinat, Virgie Dad, MD [facility] None  11/16/2020  1:00 PM [place] FLUSH [facility] None  02/13/2021  1:00 PM Reed, Tiffany L, DO [facility] None    No orders of the defined types were placed in this encounter.      Subjective:   Patient ID:  Shelia Marks is a 86 y.o. (DOB 1926/08/16) female.  Chief Complaint:  Chief Complaint  Patient presents with   Follow-up    HPI Shelia Marks  is a 86 y.o. female with a diagnosis of an ER positive malignant neoplasm of the left breast with chest wall cutaneous recurrence. Shelia Marks is followed by Dr. Jana Hakim and is treated with monthly Faslodex.  She presents with family today.  She asked multiple times why she needs to continue receiving her shot.  She also  reports that she has knots along her left chest wall and on her anterior chest wall.  She reports her energy level is low and that she is not very active.  She reports that she does not get much exercise and that she sleeps a lot.  She is otherwise doing well with no other issues or concerns.  Her appetite is good.   Medications: I have reviewed the patient's current medications.  Allergies: No Known Allergies  Past Medical History:  Diagnosis Date   Alzheimer's disease ([place])    Cancer ([place])    Disorder of bone and cartilage, unspecified    Dizziness and giddiness    Hyperlipidemia LDL goal < 100    Hypertension    Hypothyroidism    Malignant neoplasm of thyroid gland (HCC)    Nontoxic uninodular goiter    Other malaise and fatigue    Other symptoms involving cardiovascular system    Phlebitis and thrombophlebitis of unspecified site    Senile osteoporosis    Sleep related leg cramps    Stroke ([place])    Unspecified disorder of lipoid metabolism    Unspecified hereditary and idiopathic peripheral neuropathy    Varicose veins of lower extremities with inflammation     Past Surgical History:  Procedure Laterality Date   ABDOMINAL HYSTERECTOMY     DR HUGES   BREAST BIOPSY Left 05/21/2016   malignant   CATRACT SURGERY  2008   COLON SURGERY     clipped polyp during colonoscopy   EXCISION OF KELOID Left 02/19/2020   Procedure: EXCISION NODULE LEFT CHEST WALL;  Surgeon: Jovita Kussmaul, MD;  Location: [facility];  Service: General;  Laterality: Left;   KNEE SURGERY     right   MASTECTOMY MODIFIED RADICAL Left 09/10/2019   Procedure: LEFT MASTECTOMY MODIFIED RADICAL;  Surgeon: Jovita Kussmaul, MD;  Location: [facility];  Service: General;  Laterality: Left;   [place]   THYROID SURGERY  July 20, 2011    Family History  Problem Relation Age of Onset   Diabetes Brother     Social History   Socioeconomic History    Marital status: Widowed    Spouse name: Not on file   Number of children: Not on file   Years of education: Not on file   Highest education level: Not on file  Occupational History   Not on file  Tobacco Use   Smoking status: Never Smoker   Smokeless tobacco: Never Used  Vaping Use   Vaping Use: Never used  Substance and Sexual Activity   Alcohol use: No   Drug use: No   Sexual activity: Not on file  Other Topics Concern   Not on file  Social History Narrative   Not on file   Social Determinants of Health   Financial Resource Strain:    Difficulty of Paying Living Expenses: Not on file  Food Insecurity:    Worried About [place] in the Last Year: Not on file   Ran Out of Food in the Last Year: Not on file  Transportation Needs:    Lack of Transportation (Medical): Not on file   Lack of Transportation (Non-Medical): Not on file  Physical Activity:    Days of Exercise per Week: Not on file   Minutes of Exercise per Session: Not on file  Stress:    Feeling of Stress : Not on file  Social Connections:    Frequency of Communication with Friends and Family: Not on file   Frequency of Social Gatherings with Friends and Family: Not on file   Attends Religious Services: Not on file   Active Member of Clubs or Organizations: Not on file   Attends Archivist Meetings: Not on file   Marital Status: Not on file  Intimate Partner Violence:    Fear of Current or Ex-Partner: Not on file   Emotionally Abused: Not on file   Physically Abused: Not on file   Sexually Abused: Not on file    Past Medical History, Surgical history, Social history, and Family history were reviewed and updated as appropriate.   Please see review of systems for further details on the patient's review from today.   Review of Systems:  Review of Systems  Constitutional: Positive for fatigue. Negative for chills, diaphoresis and fever.  HENT: Negative for  trouble swallowing and voice change.   Respiratory: Negative for cough, chest tightness, shortness of breath and wheezing.   Cardiovascular: Negative for chest pain and palpitations.  Gastrointestinal: Negative for abdominal pain, constipation, diarrhea, nausea and vomiting.  Musculoskeletal: Negative for back pain and myalgias.  Skin:       Anterior chest wall masses and lateral chest wall nodules.  Neurological: Negative for dizziness, light-headedness and headaches.  Psychiatric/Behavioral: Positive for confusion and decreased concentration.    Objective:   Physical Exam:  BP (!) 157/71 (BP Location: Left Arm, Patient Position: Sitting)    Pulse 60    Temp (!) 97.4 F (36.3 C) (Tympanic)    Resp 18  Ht 5\' 1"  (1.549 m)    Wt 148 lb 4.8 oz (67.3 kg)    SpO2 97%    BMI 28.02 kg/m  ECOG: 1  Physical Exam Constitutional:      General: She is not in acute distress.    Appearance: Normal appearance. She is well-groomed and normal weight. She is not diaphoretic.  HENT:     Head: Normocephalic and atraumatic.  Cardiovascular:     Rate and Rhythm: Normal rate and regular rhythm.     Heart sounds: Normal heart sounds. No murmur heard.  No friction rub. No gallop.   Pulmonary:     Effort: Pulmonary effort is normal. No respiratory distress.     Breath sounds: Normal breath sounds. No wheezing or rales.  Skin:    General: Skin is warm and dry.     Findings: No erythema or rash.     Comments: A well-healed left mastectomy scar is noted.  There is several large masses over the anterior chest wall along the scar and hard nodular subcutaneous metastatic deposits along the lateral chest wall.  These are without ulceration or skin breakdown.  Psychiatric:        Behavior: Behavior is cooperative.        Cognition and Memory: Memory is impaired.     Lab Review:     Component Value Date/Time   NA 142 10/19/2020 1048   NA 143 06/01/2016 0958   K 3.9 10/19/2020 1048   CL 107 10/19/2020  1048   CO2 27 10/19/2020 1048   GLUCOSE 89 10/19/2020 1048   BUN 15 10/19/2020 1048   BUN 21 06/01/2016 0958   CREATININE 0.98 10/19/2020 1048   CREATININE 1.13 (H) 10/03/2020 1541   CALCIUM 10.0 10/19/2020 1048   PROT 7.4 10/19/2020 1048   PROT 6.8 11/09/2015 1013   ALBUMIN 3.5 10/19/2020 1048   ALBUMIN 4.1 11/09/2015 1013   AST 41 10/19/2020 1048   ALT 27 10/19/2020 1048   ALKPHOS 72 10/19/2020 1048   BILITOT 0.7 10/19/2020 1048   GFRNONAA 50 (L) 10/19/2020 1048   GFRNONAA 42 (L) 10/03/2020 1541   GFRAA 49 (L) 10/03/2020 1541       Component Value Date/Time   WBC 3.9 (L) 10/19/2020 1048   WBC 4.0 10/03/2020 1541   RBC 3.76 (L) 10/19/2020 1048   HGB 11.3 (L) 10/19/2020 1048   HGB 11.7 06/01/2016 0958   HCT 35.6 (L) 10/19/2020 1048   HCT 37.1 06/01/2016 0958   PLT 192 10/19/2020 1048   PLT 193 06/01/2016 0958   MCV 94.7 10/19/2020 1048   MCV 93 06/01/2016 0958   MCH 30.1 10/19/2020 1048   MCHC 31.7 10/19/2020 1048   RDW 14.7 10/19/2020 1048   RDW 16.0 (H) 06/01/2016 0958   LYMPHSABS 0.9 10/19/2020 1048   LYMPHSABS 1.1 06/01/2016 0958   MONOABS 0.3 10/19/2020 1048   EOSABS 0.1 10/19/2020 1048   EOSABS 0.1 06/01/2016 0958   BASOSABS 0.0 10/19/2020 1048   BASOSABS 0.0 06/01/2016 0958   -------------------------------  Imaging from last 24 hours (if applicable):  Radiology interpretation: No results found.

## 2020-10-24 ENCOUNTER — Other Ambulatory Visit: Payer: Self-pay | Admitting: Internal Medicine

## 2020-11-04 ENCOUNTER — Other Ambulatory Visit: Payer: Self-pay

## 2020-11-04 ENCOUNTER — Encounter: Payer: Self-pay | Admitting: Family

## 2020-11-04 ENCOUNTER — Ambulatory Visit (INDEPENDENT_AMBULATORY_CARE_PROVIDER_SITE_OTHER): Payer: Medicare Other | Admitting: Family

## 2020-11-04 DIAGNOSIS — Z0000 Encounter for general adult medical examination without abnormal findings: Secondary | ICD-10-CM | POA: Diagnosis not present

## 2020-11-04 NOTE — Patient Instructions (Signed)
Shelia Marks , Thank you for taking time to come for your Medicare Wellness Visit. I appreciate your ongoing commitment to your health goals. Please review the following plan we discussed and let me know if I can assist you in the future.   Screening recommendations/referrals: Colonoscopy N/A Mammogram 2020 Bone Density N/A Recommended yearly ophthalmology/optometry visit for glaucoma screening and checkup Recommended yearly dental visit for hygiene and checkup  Vaccinations: Influenza vaccine Up to date Pneumococcal vaccine: Up to date Tdap vaccine : Up to date Shingles vaccine N/A  Advanced directives: Yes  Conditions/risks identified: Advanced age female >84 yrs, hypertension, hyperlipidemia  Next appointment: 1 year   Preventive Care 84 Years and Older, Female Preventive care refers to lifestyle choices and visits with your health care provider that can promote health and wellness. What does preventive care include?  A yearly physical exam. This is also called an annual well check.  Dental exams once or twice a year.  Routine eye exams. Ask your health care provider how often you should have your eyes checked.  Personal lifestyle choices, including:  Daily care of your teeth and gums.  Regular physical activity.  Eating a healthy diet.  Avoiding tobacco and drug use.  Limiting alcohol use.  Practicing safe sex.  Taking low-dose aspirin every day.  Taking vitamin and mineral supplements as recommended by your health care provider. What happens during an annual well check? The services and screenings done by your health care provider during your annual well check will depend on your age, overall health, lifestyle risk factors, and family history of disease. Counseling  Your health care provider may ask you questions about your:  Alcohol use.  Tobacco use.  Drug use.  Emotional well-being.  Home and relationship well-being.  Sexual activity.  Eating  habits.  History of falls.  Memory and ability to understand (cognition).  Work and work Statistician.  Reproductive health. Screening  You may have the following tests or measurements:  Height, weight, and BMI.  Blood pressure.  Lipid and cholesterol levels. These may be checked every 5 years, or more frequently if you are over 6 years old.  Skin check.  Lung cancer screening. You may have this screening every year starting at age 57 if you have a 30-pack-year history of smoking and currently smoke or have quit within the past 15 years.  Fecal occult blood test (FOBT) of the stool. You may have this test every year starting at age 84.  Flexible sigmoidoscopy or colonoscopy. You may have a sigmoidoscopy every 5 years or a colonoscopy every 10 years starting at age 84.  Hepatitis C blood test.  Hepatitis B blood test.  Sexually transmitted disease (STD) testing.  Diabetes screening. This is done by checking your blood sugar (glucose) after you have not eaten for a while (fasting). You may have this done every 1-3 years.  Bone density scan. This is done to screen for osteoporosis. You may have this done starting at age 84.  Mammogram. This may be done every 1-2 years. Talk to your health care provider about how often you should have regular mammograms. Talk with your health care provider about your test results, treatment options, and if necessary, the need for more tests. Vaccines  Your health care provider may recommend certain vaccines, such as:  Influenza vaccine. This is recommended every year.  Tetanus, diphtheria, and acellular pertussis (Tdap, Td) vaccine. You may need a Td booster every 10 years.  Zoster vaccine. You may  need this after age 84.  Pneumococcal 13-valent conjugate (PCV13) vaccine. One dose is recommended after age 84.  Pneumococcal polysaccharide (PPSV23) vaccine. One dose is recommended after age 84. Talk to your health care provider about which  screenings and vaccines you need and how often you need them. This information is not intended to replace advice given to you by your health care provider. Make sure you discuss any questions you have with your health care provider. Document Released: 01/13/2016 Document Revised: 09/05/2016 Document Reviewed: 10/18/2015 Elsevier Interactive Patient Education  2017 [place] Prevention in the Home Falls can cause injuries. They can happen to people of all ages. There are many things you can do to make your home safe and to help prevent falls. What can I do on the outside of my home?  Regularly fix the edges of walkways and driveways and fix any cracks.  Remove anything that might make you trip as you walk through a door, such as a raised step or threshold.  Trim any bushes or trees on the path to your home.  Use bright outdoor lighting.  Clear any walking paths of anything that might make someone trip, such as rocks or tools.  Regularly check to see if handrails are loose or broken. Make sure that both sides of any steps have handrails.  Any raised decks and porches should have guardrails on the edges.  Have any leaves, snow, or ice cleared regularly.  Use sand or salt on walking paths during winter.  Clean up any spills in your garage right away. This includes oil or grease spills. What can I do in the bathroom?  Use night lights.  Install grab bars by the toilet and in the tub and shower. Do not use towel bars as grab bars.  Use non-skid mats or decals in the tub or shower.  If you need to sit down in the shower, use a plastic, non-slip stool.  Keep the floor dry. Clean up any water that spills on the floor as soon as it happens.  Remove soap buildup in the tub or shower regularly.  Attach bath mats securely with double-sided non-slip rug tape.  Do not have throw rugs and other things on the floor that can make you trip. What can I do in the bedroom?  Use  night lights.  Make sure that you have a light by your bed that is easy to reach.  Do not use any sheets or blankets that are too big for your bed. They should not hang down onto the floor.  Have a firm chair that has side arms. You can use this for support while you get dressed.  Do not have throw rugs and other things on the floor that can make you trip. What can I do in the kitchen?  Clean up any spills right away.  Avoid walking on wet floors.  Keep items that you use a lot in easy-to-reach places.  If you need to reach something above you, use a strong step stool that has a grab bar.  Keep electrical cords out of the way.  Do not use floor polish or wax that makes floors slippery. If you must use wax, use non-skid floor wax.  Do not have throw rugs and other things on the floor that can make you trip. What can I do with my stairs?  Do not leave any items on the stairs.  Make sure that there are handrails on both sides  of the stairs and use them. Fix handrails that are broken or loose. Make sure that handrails are as long as the stairways.  Check any carpeting to make sure that it is firmly attached to the stairs. Fix any carpet that is loose or worn.  Avoid having throw rugs at the top or bottom of the stairs. If you do have throw rugs, attach them to the floor with carpet tape.  Make sure that you have a light switch at the top of the stairs and the bottom of the stairs. If you do not have them, ask someone to add them for you. What else can I do to help prevent falls?  Wear shoes that:  Do not have high heels.  Have rubber bottoms.  Are comfortable and fit you well.  Are closed at the toe. Do not wear sandals.  If you use a stepladder:  Make sure that it is fully opened. Do not climb a closed stepladder.  Make sure that both sides of the stepladder are locked into place.  Ask someone to hold it for you, if possible.  Clearly mark and make sure that you  can see:  Any grab bars or handrails.  First and last steps.  Where the edge of each step is.  Use tools that help you move around (mobility aids) if they are needed. These include:  Canes.  Walkers.  Scooters.  Crutches.  Turn on the lights when you go into a dark area. Replace any light bulbs as soon as they burn out.  Set up your furniture so you have a clear path. Avoid moving your furniture around.  If any of your floors are uneven, fix them.  If there are any pets around you, be aware of where they are.  Review your medicines with your doctor. Some medicines can make you feel dizzy. This can increase your chance of falling. Ask your doctor what other things that you can do to help prevent falls. This information is not intended to replace advice given to you by your health care provider. Make sure you discuss any questions you have with your health care provider. Document Released: 10/13/2009 Document Revised: 05/24/2016 Document Reviewed: 01/21/2015 Elsevier Interactive Patient Education  2017 [employer].

## 2020-11-04 NOTE — Progress Notes (Signed)
Subjective:   Shelia Marks is a 86 y.o. female who presents for Medicare Annual (Subsequent) preventive examination.  Review of Systems     Cardiac Risk Factors include: advanced age (>57men, >62 women);hypertension;dyslipidemia     Objective:    There were no vitals filed for this visit. There is no height or weight on file to calculate BMI.  Advanced Directives 11/04/2020 10/03/2020 09/21/2020 08/17/2020 07/18/2020 05/24/2020 05/15/2020  Does Patient Have a Medical Advance Directive? Yes - Yes Yes Yes Yes No  Type of Advance Directive Out of facility DNR (pink MOST or yellow form) [place];Out of facility DNR (pink MOST or yellow form) Out of facility DNR (pink MOST or yellow form);Healthcare Power of [employer] of facility DNR (pink MOST or yellow form);Healthcare Power of [place];Living will Out of facility DNR (pink MOST or yellow form);Healthcare Power of Attorney -  Does patient want to make changes to medical advance directive? No - Patient declined - - - - No - Guardian declined -  Copy of [place] in Chart? - Yes - validated most recent copy scanned in chart (See row information) No - copy requested No - copy requested - No - copy requested -  Would patient like information on creating a medical advance directive? - - - - - - -  Pre-existing out of facility DNR order (yellow form or pink MOST form) - Pink MOST/Yellow Form most recent copy in chart - Physician notified to receive inpatient order - - - - -    Current Medications (verified) Outpatient Encounter Medications as of 11/04/2020  Medication Sig   acetaminophen (TYLENOL) 325 MG tablet Take 2 tablets (650 mg total) by mouth every 6 (six) hours as needed for mild pain, moderate pain, fever or headache.   alendronate (FOSAMAX) 70 MG tablet TAKE 1 TABLET (70 MG TOTAL) EVERY 7 (SEVEN) DAYS. TAKE WITH A FULL GLASS OF WATER ON AN EMPTY STOMACH.    allopurinol (ZYLOPRIM) 300 MG tablet TAKE 1 TABLET EVERY DAY   amLODipine (NORVASC) 10 MG tablet TAKE 1 TABLET EVERY DAY   aspirin EC 81 MG tablet Take 1 tablet (81 mg total) by mouth daily.   atenolol (TENORMIN) 25 MG tablet TAKE 1 TABLET EVERY DAY   atorvastatin (LIPITOR) 40 MG tablet TAKE 1 TABLET EVERY DAY   fulvestrant (FASLODEX) 250 MG/5ML injection Inject 500 mg into the muscle once. One injection each buttock over 1-2 minutes. Warm prior to use.   levothyroxine (SYNTHROID) 112 MCG tablet TAKE 1 TABLET BY MOUTH EVERY DAY   lisinopril (ZESTRIL) 2.5 MG tablet TAKE 1 TABLET DAILY TO CONTROL BLOOD PRESSURE   magnesium hydroxide (MILK OF MAGNESIA) 400 MG/5ML suspension Take 5-10 mLs by mouth daily as needed for mild constipation.   sertraline (ZOLOFT) 50 MG tablet TAKE ONE TABLET DAILY FOR DEPRESSION   [DISCONTINUED] iron polysaccharides (NIFEREX) 150 MG capsule Take 1 capsule (150 mg total) by mouth every other day. (Patient not taking: Reported on 05/15/2020)   No facility-administered encounter medications on file as of 11/04/2020.    Allergies (verified) Patient has no known allergies.   History: Past Medical History:  Diagnosis Date   Alzheimer's disease ([place])    Cancer ([place])    Disorder of bone and cartilage, unspecified    Dizziness and giddiness    Hyperlipidemia LDL goal < 100    Hypertension    Hypothyroidism    Malignant neoplasm of thyroid gland ([place])  Nontoxic uninodular goiter    Other malaise and fatigue    Other symptoms involving cardiovascular system    Phlebitis and thrombophlebitis of unspecified site    Senile osteoporosis    Sleep related leg cramps    Stroke ([place])    Unspecified disorder of lipoid metabolism    Unspecified hereditary and idiopathic peripheral neuropathy    Varicose veins of lower extremities with inflammation    Past Surgical History:  Procedure Laterality Date   ABDOMINAL HYSTERECTOMY     DR HUGES    BREAST BIOPSY Left 05/21/2016   malignant   CATRACT SURGERY  2008   COLON SURGERY     clipped polyp during colonoscopy   EXCISION OF KELOID Left 02/19/2020   Procedure: EXCISION NODULE LEFT CHEST WALL;  Surgeon: Jovita Kussmaul, MD;  Location: [facility];  Service: General;  Laterality: Left;   KNEE SURGERY     right   MASTECTOMY MODIFIED RADICAL Left 09/10/2019   Procedure: LEFT MASTECTOMY MODIFIED RADICAL;  Surgeon: Jovita Kussmaul, MD;  Location: [facility];  Service: General;  Laterality: Left;   [place]   THYROID SURGERY  July 20, 2011   Family History  Problem Relation Age of Onset   Diabetes Brother    Social History   Socioeconomic History   Marital status: Widowed    Spouse name: Not on file   Number of children: Not on file   Years of education: Not on file   Highest education level: Not on file  Occupational History   Not on file  Tobacco Use   Smoking status: Never Smoker   Smokeless tobacco: Never Used  Vaping Use   Vaping Use: Never used  Substance and Sexual Activity   Alcohol use: No   Drug use: No   Sexual activity: Not on file  Other Topics Concern   Not on file  Social History Narrative   Not on file   Social Determinants of Health   Financial Resource Strain:    Difficulty of Paying Living Expenses: Not on file  Food Insecurity:    Worried About [place] in the Last Year: Not on file   Ran Out of Food in the Last Year: Not on file  Transportation Needs:    Lack of Transportation (Medical): Not on file   Lack of Transportation (Non-Medical): Not on file  Physical Activity:    Days of Exercise per Week: Not on file   Minutes of Exercise per Session: Not on file  Stress:    Feeling of Stress : Not on file  Social Connections:    Frequency of Communication with Friends and Family: Not on file   Frequency of Social Gatherings with Friends and Family: Not on file    Attends Religious Services: Not on file   Active Member of Clubs or Organizations: Not on file   Attends Archivist Meetings: Not on file   Marital Status: Not on file    Tobacco Counseling Counseling given: Not Answered   Clinical Intake:  Pre-visit preparation completed: Yes  Pain : No/denies pain     BMI - recorded: 28 Nutritional Status: BMI 25 -29 Overweight Nutritional Risks: None Diabetes: No  How often do you need to have someone help you when you read instructions, pamphlets, or other written materials from your doctor or pharmacy?: 4 - Often What is the last grade level you completed in school?: High school  Diabetic?No  Interpreter Needed?: No  Information entered by :: Kieth Hartis FNP-C   Activities of Daily Living In your present state of health, do you have any difficulty performing the following activities: 11/04/2020 02/19/2020  Hearing? Y Y  Comment plans to get hearing aids -  Vision? N N  Difficulty concentrating or making decisions? Y N  Walking or climbing stairs? Y -  Dressing or bathing? Y Y  Doing errands, shopping? Y -  Comment Daughter helps -  Conservation officer, nature and eating ? Y -  Comment Daughter helps -  Using the Toilet? Y -  Comment constipation -  In the past six months, have you accidently leaked urine? Y -  Comment uses depends -  Do you have problems with loss of bowel control? N -  Managing your Medications? Y -  Comment Daughter helps -  Managing your Finances? Y -  Comment Daughter helps -  Housekeeping or managing your Housekeeping? Y -  Comment Daughter helps -  Some recent data might be hidden    Patient Care Team: Gayland Curry, DO as PCP - General (Geriatric Medicine) Rutherford Guys, MD as Consulting Physician (Ophthalmology) Magrinat, Virgie Dad, MD as Consulting Physician (Oncology) Jovita Kussmaul, MD as Consulting Physician (General Surgery) Jacelyn Pi, MD as Consulting Physician  (Endocrinology)  Indicate any recent Medical Services you may have received from other than [facility] providers in the past year (date may be approximate).     Assessment:   This is a routine wellness examination for [place].  Hearing/Vision screen  Hearing Screening   125Hz  250Hz  500Hz  1000Hz  2000Hz  3000Hz  4000Hz  6000Hz  8000Hz   Right ear:           Left ear:           Comments: No Hearing Concerns.   Vision Screening Comments: No Vision Concerns. Last Eye Exam was 12/31/2018. Patient wears prescription glasses.  Dietary issues and exercise activities discussed: Current Exercise Habits: The patient does not participate in regular exercise at present, Exercise limited by: None identified  Goals     Maintain LIfestyle     Starting today pt will maintain lifestyle.       Depression Screen PHQ 2/9 Scores 11/04/2020 10/03/2020 01/18/2020 11/03/2019 10/19/2019 06/18/2019 02/26/2019  PHQ - 2 Score 0 - 6 0 0 0 0  PHQ- 9 Score - - 11 - - - -  Exception Documentation - Other- indicate reason in comment box - - - - -    Fall Risk Fall Risk  11/04/2020 10/03/2020 05/12/2020 01/18/2020 11/03/2019  Falls in the past year? 0 0 1 1 0  Number falls in past yr: 0 - 1 0 0  Injury with Fall? 0 0 0 1 0    Any stairs in or around the home? No  If so, are there any without handrails? No  Home free of loose throw rugs in walkways, pet beds, electrical cords, etc? No  Adequate lighting in your home to reduce risk of falls? Yes   ASSISTIVE DEVICES UTILIZED TO PREVENT FALLS:  Life alert? No  Use of a cane, walker or w/c? No  Grab bars in the bathroom? Yes  Shower chair or bench in shower? Yes  Elevated toilet seat or a handicapped toilet? Yes   TIMED UP AND GO:  Was the test performed? No .  Length of time to ambulate 10 feet: n/a sec.   Gait unsteady with use of assistive device, provider informed and education provided.  Cognitive Function: MMSE - Mini Mental State Exam 10/27/2018 06/13/2017  06/01/2016 05/27/2014 06/08/2013  Orientation to time 2 2 5 3 3   Orientation to Place 5 5 5 4 5   Registration 3 3 3 3 3   Attention/ Calculation 0 0 3 3 3   Recall 3 0 0 0 1  Language- name 2 objects 2 2 2 2 2   Language- repeat 1 1 1 1 1   Language- follow 3 step command 3 2 2 3 3   Language- read & follow direction 0 1 1 1 1   Write a sentence 1 0 1 1 1   Copy design 1 1 0 1 0  Total score 21 17 23 22 23      6CIT Screen 11/04/2020 11/03/2019  What Year? 4 points 0 points  What month? 0 points 3 points  What time? 0 points 0 points  Count back from 20 4 points 4 points  Months in reverse 4 points 4 points  Repeat phrase 10 points 10 points  Total Score 22 21    Immunizations Immunization History  Administered Date(s) Administered   Fluad Quad(high Dose 65+) 09/03/2019, 10/03/2020   Influenza, High Dose Seasonal PF 10/05/2016, 10/28/2017, 10/27/2018   Influenza,inj,Quad PF,6+ Mos 10/12/2013, 10/31/2015   PFIZER SARS-COV-2 Vaccination 01/21/2020, 02/22/2020   Pneumococcal Conjugate-13 08/27/2014   Pneumococcal Polysaccharide-23 06/01/2016   Td 06/23/2018    TDAP status: Up to date Flu Vaccine status: Up to date Pneumococcal vaccine status: Up to date Covid-19 vaccine status: Completed vaccines  Qualifies for Shingles Vaccine? No   Zostavax completed No   Shingrix Completed?: No.    Education has been provided regarding the importance of this vaccine. Patient has been advised to call insurance company to determine out of pocket expense if they have not yet received this vaccine. Advised may also receive vaccine at local pharmacy or Health Dept. Verbalized acceptance and understanding.  Screening Tests Health Maintenance  Topic Date Due   TETANUS/TDAP  06/23/2028   INFLUENZA VACCINE  Completed   DEXA SCAN  Completed   COVID-19 Vaccine  Completed   PNA vac Low Risk Adult  Completed    Health Maintenance  There are no preventive care reminders to display for this  patient.  Colorectal cancer screening: No longer required.  Mammogram status: Completed 07/24/2019. Repeat every year Bone Density status: Ordered 11/16/2016. Pt provided with contact info and advised to call to schedule appt.  Lung Cancer Screening: (Low Dose CT Chest recommended if Age 39-80 years, 30 pack-year currently smoking OR have quit w/in 15years.) does not qualify.   Lung Cancer Screening Referral: does not  Additional Screening:  Hepatitis C Screening: does not qualify; Completed no  Vision Screening: Recommended annual ophthalmology exams for early detection of glaucoma and other disorders of the eye. Is the patient up to date with their annual eye exam?  Yes  Who is the provider or what is the name of the office in which the patient attends annual eye exams? Dr. Gershon Crane If pt is not established with a provider, would they like to be referred to a provider to establish care? No .   Dental Screening: Recommended annual dental exams for proper oral hygiene  Community Resource Referral / Chronic Care Management: CRR required this visit?  No   CCM required this visit?  No      Plan:     I have personally reviewed and noted the following in the patients chart:    Medical and social history  Use of alcohol, tobacco or illicit drugs   Current medications and supplements  Functional ability and status  Nutritional status  Physical activity  Advanced directives  List of other physicians  Hospitalizations, surgeries, and ER visits in previous 12 months  Vitals  Screenings to include cognitive, depression, and falls  Referrals and appointments  In addition, I have reviewed and discussed with patient certain preventive protocols, quality metrics, and best practice recommendations. A written personalized care plan for preventive services as well as general preventive health recommendations were provided to patient.     Sandrea Hughs, NP   11/04/2020    Nurse Notes: No

## 2020-11-04 NOTE — Progress Notes (Signed)
    This service is provided via telemedicine  No vital signs collected/recorded due to the encounter was a telemedicine visit.   Location of patient (ex: home, work): Home.  Patient consents to a telephone visit: Yes.  Location of the provider (ex: office, home):  [facility].  Name of any referring provider: Gayland Curry, DO   Names of all persons participating in the telemedicine service and their role in the encounter:  Patient, Heriberto [place]igua, [place], [place], Webb Silversmith, NP.    Time spent on call: 14 minutes spent on the phone with Medical Assistant.

## 2020-11-07 ENCOUNTER — Telehealth: Payer: Self-pay

## 2020-11-07 NOTE — Telephone Encounter (Signed)
[name], hilgert are scheduled for a virtual visit with your provider today.    Just as we do with appointments in the office, we must obtain your consent to participate.  Your consent will be active for this visit and any virtual visit you may have with one of our providers in the next 365 days.    If you have a MyChart account, I can also send a copy of this consent to you electronically.  All virtual visits are billed to your insurance company just like a traditional visit in the office.  As this is a virtual visit, video technology does not allow for your provider to perform a traditional examination.  This may limit your provider's ability to fully assess your condition.  If your provider identifies any concerns that need to be evaluated in person or the need to arrange testing such as labs, EKG, etc, we will make arrangements to do so.    Although advances in technology are sophisticated, we cannot ensure that it will always work on either your end or our end.  If the connection with a video visit is poor, we may have to switch to a telephone visit.  With either a video or telephone visit, we are not always able to ensure that we have a secure connection.   I need to obtain your verbal consent now.   Are you willing to proceed with your visit today?   [name] has provided verbal consent on 11/04/2020 for a virtual visit (video or telephone).   Otis [place], CMA 11/04/2020  9:00 AM

## 2020-11-14 ENCOUNTER — Other Ambulatory Visit: Payer: Self-pay | Admitting: Internal Medicine

## 2020-11-15 NOTE — Progress Notes (Signed)
[place]  Telephone:([phone]) [phone] Fax:(336) [phone]    ID: Shelia Marks DOB: 1926/06/20  MR#: [id number]  YBF#:[id number]  Patient Care Team: Gayland Curry, DO as PCP - General (Geriatric Medicine) Rutherford Guys, MD as Consulting Physician (Ophthalmology) Jaslynne Dahan, Virgie Dad, MD as Consulting Physician (Oncology) Jovita Kussmaul, MD as Consulting Physician (General Surgery) Jacelyn Pi, MD as Consulting Physician (Endocrinology) OTHER MD:   CHIEF COMPLAINT: Estrogen receptor positive breast cancer (s/p left mastectomy)  CURRENT TREATMENT: Fulvestrant;   INTERVAL HISTORY: Shelia Marks returns today for follow-up of her estrogen receptor positive breast cancer, accompanied by her sister.   Shelia Marks receives Fulvestrant every 4 weeks. She does not report any symptoms from this. The sister is not aware of any specific problems relating to the treatment.   REVIEW OF SYSTEMS: Shelia Marks tells me that her side bothers her's and itches sometimes, pointing to the left lateral chest wall. The sister tells me that the real problem is dementia and that the patient remains extremely confused. A detailed review of systems was otherwise stable.   COVID 19 VACCINATION STATUS: Status post [place] x2, most recently February 2021   BREAST CANCER HISTORY: From the original intake note:  "[place]ymee" had bilateral screening mammography at the [facility] 06/07/2016 showing calcifications in the left breast. She was recalled  05/15/2016 for left diagnostic mammography. This showed the breast density to be category be. In the upper outer left breast there was an area of suspicious calcifications measuring 1.7 cm.  Biopsy of this area was obtained 05/21/2016, and showed ([id number]) ductal carcinoma in situ, high-grade, estrogen receptor 100% positive, progesterone receptor 5% positive, WITH strong staining intensity.  Her subsequent history is as detailed below.   PAST  MEDICAL HISTORY: Past Medical History:  Diagnosis Date  . Alzheimer's disease ([place])   . Cancer ([place])   . Disorder of bone and cartilage, unspecified   . Dizziness and giddiness   . Hyperlipidemia LDL goal < 100   . Hypertension   . Hypothyroidism   . Malignant neoplasm of thyroid gland ([place])   . Nontoxic uninodular goiter   . Other malaise and fatigue   . Other symptoms involving cardiovascular system   . Phlebitis and thrombophlebitis of unspecified site   . Senile osteoporosis   . Sleep related leg cramps   . Stroke ([place])   . Unspecified disorder of lipoid metabolism   . Unspecified hereditary and idiopathic peripheral neuropathy   . Varicose veins of lower extremities with inflammation     PAST SURGICAL HISTORY: Past Surgical History:  Procedure Laterality Date  . ABDOMINAL HYSTERECTOMY     DR HUGES  . BREAST BIOPSY Left 05/21/2016   malignant  . CATRACT SURGERY  2008  . COLON SURGERY     clipped polyp during colonoscopy  . EXCISION OF KELOID Left 02/19/2020   Procedure: EXCISION NODULE LEFT CHEST WALL;  Surgeon: Jovita Kussmaul, MD;  Location: [facility];  Service: General;  Laterality: Left;  . KNEE SURGERY     right  . MASTECTOMY MODIFIED RADICAL Left 09/10/2019   Procedure: LEFT MASTECTOMY MODIFIED RADICAL;  Surgeon: Jovita Kussmaul, MD;  Location: [facility];  Service: General;  Laterality: Left;  . PARATHYROID ADENOMA REMOVED  1983  . THYROID SURGERY  July 20, 2011    FAMILY HISTORY Family History  Problem Relation Age of Onset  . Diabetes Brother    The patient's father died from liver problems in his 72s. The patient's  mother died in her 69s from "natural causes". The patient had no brothers. She had 6 sisters, 2 of whom died from complications of diabetes and one from a ruptured aneurysm. There is no history of breast or ovarian cancer in the family.    GYNECOLOGIC HISTORY:  No LMP recorded. Patient has had a hysterectomy.  Shelia Marks does not  remember how she was when she had her first period. She never carried a child to term. She is status post hysterectomy and at least one ovary was removed. She never took hormone replacement.    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  She is widowed and lives with her sister.   ADVANCED DIRECTIVES: [name], Shelia Marks, is her healthcare power of attorney. [name] can be reached at [phone].   HEALTH MAINTENANCE: Social History   Tobacco Use  . Smoking status: Never Smoker  . Smokeless tobacco: Never Used  Vaping Use  . Vaping Use: Never used  Substance Use Topics  . Alcohol use: No  . Drug use: No     No Known Allergies  Current Outpatient Medications  Medication Sig Dispense Refill  . acetaminophen (TYLENOL) 325 MG tablet Take 2 tablets (650 mg total) by mouth every 6 (six) hours as needed for mild pain, moderate pain, fever or headache.    . alendronate (FOSAMAX) 70 MG tablet TAKE 1 TABLET (70 MG TOTAL) EVERY 7 (SEVEN) DAYS. TAKE WITH A FULL GLASS OF WATER ON AN EMPTY STOMACH. 12 tablet 1  . allopurinol (ZYLOPRIM) 300 MG tablet TAKE 1 TABLET EVERY DAY 90 tablet 1  . amLODipine (NORVASC) 10 MG tablet TAKE 1 TABLET EVERY DAY 90 tablet 1  . aspirin EC 81 MG tablet Take 1 tablet (81 mg total) by mouth daily. 30 tablet 3  . atenolol (TENORMIN) 25 MG tablet TAKE 1 TABLET EVERY DAY 90 tablet 1  . atorvastatin (LIPITOR) 40 MG tablet TAKE 1 TABLET EVERY DAY 90 tablet 0  . fulvestrant (FASLODEX) 250 MG/5ML injection Inject 500 mg into the muscle once. One injection each buttock over 1-2 minutes. Warm prior to use.    . levothyroxine (SYNTHROID) 112 MCG tablet TAKE 1 TABLET BY MOUTH EVERY DAY 90 tablet 3  . lisinopril (ZESTRIL) 2.5 MG tablet TAKE 1 TABLET DAILY TO CONTROL BLOOD PRESSURE 90 tablet 1  . magnesium hydroxide (MILK OF MAGNESIA) 400 MG/5ML suspension Take 5-10 mLs by mouth daily as needed for mild constipation.    .  sertraline (ZOLOFT) 50 MG tablet TAKE ONE TABLET DAILY FOR DEPRESSION 90 tablet 1   No current facility-administered medications for this visit.    OBJECTIVE: African-American woman examined in a wheelchair Vitals:   11/16/20 1234  BP: (!) 158/82  Pulse: 66  Resp: 18  Temp: (!) 97 F (36.1 C)  SpO2: 100%     Body mass index is 27.55 kg/m.    ECOG FS:2 - Symptomatic, <50% confined to bed  Sclerae unicteric, EOMs intact Wearing a mask No cervical or supraclavicular adenopathy Lungs no rales or rhonchi Heart regular rate and rhythm Abd soft, nontender, positive bowel sounds MSK no focal spinal tenderness, no upper extremity lymphedema Neuro: nonfocal, guarded affect Breasts: The left breast is status post mastectomy and radiation. There is significant hyperpigmentation and scarring. The area was not entirely uncovered today.   LAB RESULTS:  CMP     Component Value Date/Time   NA 141 11/16/2020 1213  NA 143 06/01/2016 0958   K 3.7 11/16/2020 1213   CL 106 11/16/2020 1213   CO2 29 11/16/2020 1213   GLUCOSE 87 11/16/2020 1213   BUN 14 11/16/2020 1213   BUN 21 06/01/2016 0958   CREATININE 0.90 11/16/2020 1213   CREATININE 1.13 (H) 10/03/2020 1541   CALCIUM 9.6 11/16/2020 1213   PROT 7.4 11/16/2020 1213   PROT 6.8 11/09/2015 1013   ALBUMIN 3.5 11/16/2020 1213   ALBUMIN 4.1 11/09/2015 1013   AST 44 (H) 11/16/2020 1213   ALT 24 11/16/2020 1213   ALKPHOS 68 11/16/2020 1213   BILITOT 0.7 11/16/2020 1213   GFRNONAA >60 11/16/2020 1213   GFRNONAA 42 (L) 10/03/2020 1541   GFRAA 49 (L) 10/03/2020 1541    INo results found for: SPEP, UPEP  Lab Results  Component Value Date   WBC 4.3 11/16/2020   NEUTROABS 2.8 11/16/2020   HGB 11.4 (L) 11/16/2020   HCT 36.7 11/16/2020   MCV 95.6 11/16/2020   PLT 194 11/16/2020      Chemistry      Component Value Date/Time   NA 141 11/16/2020 1213   NA 143 06/01/2016 0958   K 3.7 11/16/2020 1213   CL 106 11/16/2020 1213   CO2  29 11/16/2020 1213   BUN 14 11/16/2020 1213   BUN 21 06/01/2016 0958   CREATININE 0.90 11/16/2020 1213   CREATININE 1.13 (H) 10/03/2020 1541      Component Value Date/Time   CALCIUM 9.6 11/16/2020 1213   ALKPHOS 68 11/16/2020 1213   AST 44 (H) 11/16/2020 1213   ALT 24 11/16/2020 1213   BILITOT 0.7 11/16/2020 1213       No results found for: LABCA2  No components found for: LABCA125  No results for input(s): INR in the last 168 hours.  Urinalysis    Component Value Date/Time   COLORURINE YELLOW 05/23/2020 1454   APPEARANCEUR CLEAR 05/23/2020 1454   LABSPEC 1.016 05/23/2020 1454   PHURINE 6.0 05/23/2020 1454   GLUCOSEU NEGATIVE 05/23/2020 1454   HGBUR NEGATIVE 05/23/2020 1454   BILIRUBINUR NEGATIVE 05/23/2020 1454   KETONESUR NEGATIVE 05/23/2020 1454   PROTEINUR NEGATIVE 05/23/2020 1454   UROBILINOGEN 0.2 03/05/2014 2055   NITRITE NEGATIVE 05/23/2020 1454   LEUKOCYTESUR SMALL (A) 05/23/2020 1454    STUDIES: No results found.   ELIGIBLE FOR AVAILABLE RESEARCH PROTOCOL: no   ASSESSMENT: 86 y.o. [place] woman status post left breast upper outer quadrant biopsy 05/21/2016 for ductal carcinoma in situ, high-grade, estrogen and progesterone receptor positive  (1) opted against surgery   (2) started anastrozole neoadjuvantly 06/13/2016, discontinued June 2018 due to osteoporosis concerns  (a)  bone density 11/16/2016 shows a T score of -4.7 (osteoporosis)-on fosamax.   (3) mammography/ultrasonography 06/10/2018 shows evidence of disease progression, possible lymph node extension  (4) letrozole started 06/27/2018   (a) discontinued November 2019 with evidence of progression  (b) restarted 09/2019, to change to Exemestane in 12/2019  (5) fulvestrant started 11/21/2018, discontinued 03/12/2019  (a) mammography and ultrasonography 07/24/2019 shows evidence of disease progression  (6) Left modified radical mastectomy on 09/10/2019: showing a stage IIIA, T2, N2a,  invasive ductal carcinoma, grade 3, margins negative, estrogen receptor positive, progesterone receptor and HER-2 negative, with an MIB-1 of 90%  (a) 4 out of 14 lymph nodes positive for macrometastases  (b) 1 out of 14 lymph nodes positive for micrometastases  (c) CT chest on 08/26/2019 negative for metastatic disease  (7) Adjuvant radiation recommended on 09/24/2019 and   again on10/09/2019, declined by patient  (8) removal of a left chest wall lesion 02/19/2020 showing metastatic breast cancer, estrogen receptor 95% and strongly positive, progesterone receptor negative, HER-2 negative with an MIB-1 of 50%  (a) patient and sister again opted against adjuvant radiation at 03/07/2020 visit  (b) additional lesions noted clincially 07/07/2020  (c) adjuvant radiation to left chest wall from 08/09/2020 through 08/22/2020  (9) exemestane started December 2020 through 07/2020, discontinued due to progression  (b) began Fulvestrant on 08/17/2020   PLAN: Abbiegail continues on Faslodex. She is tolerating it well. It is difficult to know if we are controlling the tumor or not. She will return in 4 weeks for treatment and in 8 weeks for a visit. At that time we will image the areas of tumor on the chest wall for future reference  They know to call us for any other issue that may develop before then    C. , MD 11/16/20 4:37 PM Medical Oncology and Hematology [facility] [address] [place], [place] [address] Tel. [phone]    Fax. [phone]   I, Katie Daubenspeck, am acting as scribe for Dr.  C. .  I,   MD, have reviewed the above documentation for accuracy and completeness, and I agree with the above.    *Total Encounter Time as defined by the Centers for Medicare and Medicaid Services includes, in addition to the face-to-face time of a patient visit (documented in the note above) non-face-to-face time: obtaining and reviewing outside  history, ordering and reviewing medications, tests or procedures, care coordination (communications with other health care professionals or caregivers) and documentation in the medical record. 

## 2020-11-16 ENCOUNTER — Other Ambulatory Visit: Payer: Self-pay

## 2020-11-16 ENCOUNTER — Inpatient Hospital Stay (HOSPITAL_BASED_OUTPATIENT_CLINIC_OR_DEPARTMENT_OTHER): Payer: Medicare Other | Admitting: Oncology

## 2020-11-16 ENCOUNTER — Inpatient Hospital Stay: Payer: Medicare Other

## 2020-11-16 ENCOUNTER — Inpatient Hospital Stay: Payer: Medicare Other | Attending: Oncology

## 2020-11-16 VITALS — BP 158/82 | HR 66 | Temp 97.0°F | Resp 18 | Ht 61.0 in | Wt 145.8 lb

## 2020-11-16 DIAGNOSIS — Z170 Estrogen receptor positive status [ER+]: Secondary | ICD-10-CM

## 2020-11-16 DIAGNOSIS — E039 Hypothyroidism, unspecified: Secondary | ICD-10-CM | POA: Insufficient documentation

## 2020-11-16 DIAGNOSIS — D0512 Intraductal carcinoma in situ of left breast: Secondary | ICD-10-CM

## 2020-11-16 DIAGNOSIS — C50812 Malignant neoplasm of overlapping sites of left female breast: Secondary | ICD-10-CM | POA: Diagnosis not present

## 2020-11-16 DIAGNOSIS — C50412 Malignant neoplasm of upper-outer quadrant of left female breast: Secondary | ICD-10-CM | POA: Diagnosis not present

## 2020-11-16 DIAGNOSIS — I6350 Cerebral infarction due to unspecified occlusion or stenosis of unspecified cerebral artery: Secondary | ICD-10-CM | POA: Diagnosis not present

## 2020-11-16 DIAGNOSIS — C50912 Malignant neoplasm of unspecified site of left female breast: Secondary | ICD-10-CM

## 2020-11-16 DIAGNOSIS — C7989 Secondary malignant neoplasm of other specified sites: Secondary | ICD-10-CM

## 2020-11-16 DIAGNOSIS — N183 Chronic kidney disease, stage 3 unspecified: Secondary | ICD-10-CM

## 2020-11-16 DIAGNOSIS — Z5111 Encounter for antineoplastic chemotherapy: Secondary | ICD-10-CM | POA: Insufficient documentation

## 2020-11-16 LAB — CMP (CANCER CENTER ONLY)
ALT: 24 U/L (ref 0–44)
AST: 44 U/L — ABNORMAL HIGH (ref 15–41)
Albumin: 3.5 g/dL (ref 3.5–5.0)
Alkaline Phosphatase: 68 U/L (ref 38–126)
Anion gap: 6 (ref 5–15)
BUN: 14 mg/dL (ref 8–23)
CO2: 29 mmol/L (ref 22–32)
Calcium: 9.6 mg/dL (ref 8.9–10.3)
Chloride: 106 mmol/L (ref 98–111)
Creatinine: 0.9 mg/dL (ref 0.44–1.00)
GFR, Estimated: >60 mL/min (ref >60)
Glucose, Bld: 87 mg/dL (ref 70–99)
Potassium: 3.7 mmol/L (ref 3.5–5.1)
Sodium: 141 mmol/L (ref 135–145)
Total Bilirubin: 0.7 mg/dL (ref 0.3–1.2)
Total Protein: 7.4 g/dL (ref 6.5–8.1)

## 2020-11-16 LAB — CBC WITH DIFFERENTIAL (CANCER CENTER ONLY)
Abs Immature Granulocytes: 0.02 10*3/uL (ref 0.00–0.07)
Basophils Absolute: 0 10*3/uL (ref 0.0–0.1)
Basophils Relative: 1 %
Eosinophils Absolute: 0.1 10*3/uL (ref 0.0–0.5)
Eosinophils Relative: 2 %
HCT: 36.7 % (ref 36.0–46.0)
Hemoglobin: 11.4 g/dL — ABNORMAL LOW (ref 12.0–15.0)
Immature Granulocytes: 1 %
Lymphocytes Relative: 24 %
Lymphs Abs: 1 10*3/uL (ref 0.7–4.0)
MCH: 29.7 pg (ref 26.0–34.0)
MCHC: 31.1 g/dL (ref 30.0–36.0)
MCV: 95.6 fL (ref 80.0–100.0)
Monocytes Absolute: 0.3 10*3/uL (ref 0.1–1.0)
Monocytes Relative: 8 %
Neutro Abs: 2.8 10*3/uL (ref 1.7–7.7)
Neutrophils Relative %: 64 %
Platelet Count: 194 10*3/uL (ref 150–400)
RBC: 3.84 MIL/uL — ABNORMAL LOW (ref 3.87–5.11)
RDW: 14.8 % (ref 11.5–15.5)
WBC Count: 4.3 10*3/uL (ref 4.0–10.5)
nRBC: 0 % (ref 0.0–0.2)

## 2020-11-16 MED ORDER — FULVESTRANT 250 MG/5ML IM SOLN
INTRAMUSCULAR | Status: AC
  Filled 2020-11-16: qty 10

## 2020-11-16 MED ORDER — FULVESTRANT 250 MG/5ML IM SOLN
500.0000 mg | Freq: Once | INTRAMUSCULAR | Status: AC
  Administered 2020-11-16: 500 mg via INTRAMUSCULAR

## 2020-11-18 ENCOUNTER — Telehealth: Payer: Self-pay | Admitting: Hematology and Oncology

## 2020-11-18 NOTE — Telephone Encounter (Signed)
Scheduled appts per 11/17 los. Appts were confirmed.

## 2020-12-14 ENCOUNTER — Other Ambulatory Visit: Payer: Self-pay

## 2020-12-14 ENCOUNTER — Inpatient Hospital Stay: Payer: Medicare Other | Attending: Oncology

## 2020-12-14 ENCOUNTER — Inpatient Hospital Stay: Payer: Medicare Other

## 2020-12-14 VITALS — BP 148/72 | HR 65 | Temp 97.6°F | Resp 18

## 2020-12-14 DIAGNOSIS — Z5111 Encounter for antineoplastic chemotherapy: Secondary | ICD-10-CM | POA: Insufficient documentation

## 2020-12-14 DIAGNOSIS — C50412 Malignant neoplasm of upper-outer quadrant of left female breast: Secondary | ICD-10-CM | POA: Diagnosis not present

## 2020-12-14 DIAGNOSIS — Z170 Estrogen receptor positive status [ER+]: Secondary | ICD-10-CM

## 2020-12-14 DIAGNOSIS — N183 Chronic kidney disease, stage 3 unspecified: Secondary | ICD-10-CM

## 2020-12-14 LAB — CMP (CANCER CENTER ONLY)
ALT: 32 U/L (ref 0–44)
AST: 52 U/L — ABNORMAL HIGH (ref 15–41)
Albumin: 3.7 g/dL (ref 3.5–5.0)
Alkaline Phosphatase: 67 U/L (ref 38–126)
Anion gap: 2 — ABNORMAL LOW (ref 5–15)
BUN: 15 mg/dL (ref 8–23)
CO2: 29 mmol/L (ref 22–32)
Calcium: 9.9 mg/dL (ref 8.9–10.3)
Chloride: 117 mmol/L — ABNORMAL HIGH (ref 98–111)
Creatinine: 1.01 mg/dL — ABNORMAL HIGH (ref 0.44–1.00)
GFR, Estimated: 52 mL/min — ABNORMAL LOW (ref >60)
Glucose, Bld: 107 mg/dL — ABNORMAL HIGH (ref 70–99)
Potassium: 3.7 mmol/L (ref 3.5–5.1)
Sodium: 148 mmol/L — ABNORMAL HIGH (ref 135–145)
Total Bilirubin: 0.9 mg/dL (ref 0.3–1.2)
Total Protein: 7.9 g/dL (ref 6.5–8.1)

## 2020-12-14 LAB — CBC WITH DIFFERENTIAL (CANCER CENTER ONLY)
Abs Immature Granulocytes: 0.01 10*3/uL (ref 0.00–0.07)
Basophils Absolute: 0 10*3/uL (ref 0.0–0.1)
Basophils Relative: 1 %
Eosinophils Absolute: 0.1 10*3/uL (ref 0.0–0.5)
Eosinophils Relative: 2 %
HCT: 35.6 % — ABNORMAL LOW (ref 36.0–46.0)
Hemoglobin: 11 g/dL — ABNORMAL LOW (ref 12.0–15.0)
Immature Granulocytes: 0 %
Lymphocytes Relative: 24 %
Lymphs Abs: 0.9 10*3/uL (ref 0.7–4.0)
MCH: 29.8 pg (ref 26.0–34.0)
MCHC: 30.9 g/dL (ref 30.0–36.0)
MCV: 96.5 fL (ref 80.0–100.0)
Monocytes Absolute: 0.4 10*3/uL (ref 0.1–1.0)
Monocytes Relative: 10 %
Neutro Abs: 2.4 10*3/uL (ref 1.7–7.7)
Neutrophils Relative %: 63 %
Platelet Count: 174 10*3/uL (ref 150–400)
RBC: 3.69 MIL/uL — ABNORMAL LOW (ref 3.87–5.11)
RDW: 15.2 % (ref 11.5–15.5)
WBC Count: 3.8 10*3/uL — ABNORMAL LOW (ref 4.0–10.5)
nRBC: 0 % (ref 0.0–0.2)

## 2020-12-14 MED ORDER — FULVESTRANT 250 MG/5ML IM SOLN
500.0000 mg | Freq: Once | INTRAMUSCULAR | Status: AC
  Administered 2020-12-14: 12:00:00 500 mg via INTRAMUSCULAR

## 2020-12-14 MED ORDER — FULVESTRANT 250 MG/5ML IM SOLN
INTRAMUSCULAR | Status: AC
  Filled 2020-12-14: qty 10

## 2021-01-04 DIAGNOSIS — Z23 Encounter for immunization: Secondary | ICD-10-CM | POA: Diagnosis not present

## 2021-01-11 ENCOUNTER — Inpatient Hospital Stay (HOSPITAL_BASED_OUTPATIENT_CLINIC_OR_DEPARTMENT_OTHER): Payer: Medicare Other | Admitting: Adult Health

## 2021-01-11 ENCOUNTER — Inpatient Hospital Stay: Payer: Medicare Other | Attending: Oncology

## 2021-01-11 ENCOUNTER — Inpatient Hospital Stay: Payer: Medicare Other

## 2021-01-11 ENCOUNTER — Other Ambulatory Visit: Payer: Self-pay

## 2021-01-11 ENCOUNTER — Encounter: Payer: Self-pay | Admitting: Adult Health

## 2021-01-11 VITALS — BP 157/69 | HR 92 | Temp 97.7°F | Resp 18 | Ht 61.0 in | Wt 144.6 lb

## 2021-01-11 DIAGNOSIS — C50912 Malignant neoplasm of unspecified site of left female breast: Secondary | ICD-10-CM | POA: Diagnosis not present

## 2021-01-11 DIAGNOSIS — R634 Abnormal weight loss: Secondary | ICD-10-CM

## 2021-01-11 DIAGNOSIS — C50812 Malignant neoplasm of overlapping sites of left female breast: Secondary | ICD-10-CM

## 2021-01-11 DIAGNOSIS — C50412 Malignant neoplasm of upper-outer quadrant of left female breast: Secondary | ICD-10-CM | POA: Insufficient documentation

## 2021-01-11 DIAGNOSIS — Z170 Estrogen receptor positive status [ER+]: Secondary | ICD-10-CM | POA: Insufficient documentation

## 2021-01-11 DIAGNOSIS — D631 Anemia in chronic kidney disease: Secondary | ICD-10-CM

## 2021-01-11 DIAGNOSIS — C7989 Secondary malignant neoplasm of other specified sites: Secondary | ICD-10-CM | POA: Diagnosis not present

## 2021-01-11 DIAGNOSIS — Z5111 Encounter for antineoplastic chemotherapy: Secondary | ICD-10-CM | POA: Insufficient documentation

## 2021-01-11 DIAGNOSIS — C7951 Secondary malignant neoplasm of bone: Secondary | ICD-10-CM | POA: Diagnosis not present

## 2021-01-11 DIAGNOSIS — N183 Chronic kidney disease, stage 3 unspecified: Secondary | ICD-10-CM

## 2021-01-11 LAB — CBC WITH DIFFERENTIAL (CANCER CENTER ONLY)
Abs Immature Granulocytes: 0.02 10*3/uL (ref 0.00–0.07)
Basophils Absolute: 0 10*3/uL (ref 0.0–0.1)
Basophils Relative: 1 %
Eosinophils Absolute: 0.1 10*3/uL (ref 0.0–0.5)
Eosinophils Relative: 2 %
HCT: 37.2 % (ref 36.0–46.0)
Hemoglobin: 11.6 g/dL — ABNORMAL LOW (ref 12.0–15.0)
Immature Granulocytes: 1 %
Lymphocytes Relative: 23 %
Lymphs Abs: 1 10*3/uL (ref 0.7–4.0)
MCH: 30 pg (ref 26.0–34.0)
MCHC: 31.2 g/dL (ref 30.0–36.0)
MCV: 96.1 fL (ref 80.0–100.0)
Monocytes Absolute: 0.3 10*3/uL (ref 0.1–1.0)
Monocytes Relative: 8 %
Neutro Abs: 2.7 10*3/uL (ref 1.7–7.7)
Neutrophils Relative %: 65 %
Platelet Count: 186 10*3/uL (ref 150–400)
RBC: 3.87 MIL/uL (ref 3.87–5.11)
RDW: 15 % (ref 11.5–15.5)
WBC Count: 4.1 10*3/uL (ref 4.0–10.5)
nRBC: 0 % (ref 0.0–0.2)

## 2021-01-11 LAB — CMP (CANCER CENTER ONLY)
ALT: 21 U/L (ref 0–44)
AST: 47 U/L — ABNORMAL HIGH (ref 15–41)
Albumin: 3.7 g/dL (ref 3.5–5.0)
Alkaline Phosphatase: 67 U/L (ref 38–126)
Anion gap: 7 (ref 5–15)
BUN: 13 mg/dL (ref 8–23)
CO2: 27 mmol/L (ref 22–32)
Calcium: 10.2 mg/dL (ref 8.9–10.3)
Chloride: 108 mmol/L (ref 98–111)
Creatinine: 0.87 mg/dL (ref 0.44–1.00)
GFR, Estimated: >60 mL/min (ref >60)
Glucose, Bld: 99 mg/dL (ref 70–99)
Potassium: 4.2 mmol/L (ref 3.5–5.1)
Sodium: 142 mmol/L (ref 135–145)
Total Bilirubin: 0.8 mg/dL (ref 0.3–1.2)
Total Protein: 7.8 g/dL (ref 6.5–8.1)

## 2021-01-11 MED ORDER — FULVESTRANT 250 MG/5ML IM SOLN
500.0000 mg | Freq: Once | INTRAMUSCULAR | Status: AC
  Administered 2021-01-11: 500 mg via INTRAMUSCULAR

## 2021-01-11 MED ORDER — FULVESTRANT 250 MG/5ML IM SOLN
INTRAMUSCULAR | Status: AC
  Filled 2021-01-11: qty 10

## 2021-01-11 NOTE — Progress Notes (Signed)
[facility]  [phone] [phone]    ID: Shelia Marks DOB: 02-Oct-1926  MR#: [id number]  UOH#:[id number]  Patient Care Team: Gayland Curry, DO as PCP - General (Geriatric Medicine) Rutherford Guys, MD as Consulting Physician (Ophthalmology) Magrinat, Virgie Dad, MD as Consulting Physician (Oncology) Jovita Kussmaul, MD as Consulting Physician (General Surgery) Jacelyn Pi, MD as Consulting Physician (Endocrinology) OTHER MD:   CHIEF COMPLAINT: Estrogen receptor positive breast cancer (s/p left mastectomy)  CURRENT TREATMENT: Fulvestrant;   INTERVAL HISTORY: Shelia Marks returns today for follow-up of her estrogen receptor positive breast cancer, accompanied by her sister.   Shelia Marks receives Fulvestrant every 4 weeks. She does not report any symptoms from this. Her sister denies any issues that Shelia Marks reports after receiving this due to injection site soreness.    REVIEW OF SYSTEMS: Shelia Marks is doing well today.  She notes intermittent left lateral chest wall pain.  Her sister notes that she is giving her tylenol intermittently for this pain.  She denies any other issues such as cough, shortness of breath, worsening of pain, or further concerns.  Her sister feels her dementia is continuing to worsen.  A detailed ROS was otherwise non contributory.      COVID 19 VACCINATION STATUS: Status post [place] x2, most recently February 2021   BREAST CANCER HISTORY: From the original intake note:  "Shelia Marks" had bilateral screening mammography at the [facility] 06/07/2016 showing calcifications in the left breast. She was recalled  05/15/2016 for left diagnostic mammography. This showed the breast density to be category be. In the upper outer left breast there was an area of suspicious calcifications measuring 1.7 cm.  Biopsy of this area was obtained 05/21/2016, and showed (S [phone]) ductal carcinoma in situ, high-grade, estrogen receptor  100% positive, progesterone receptor 5% positive, WITH strong staining intensity.  Her subsequent history is as detailed below.   PAST MEDICAL HISTORY: Past Medical History:  Diagnosis Date  . Alzheimer's disease ([place])   . Cancer ([place])   . Disorder of bone and cartilage, unspecified   . Dizziness and giddiness   . Hyperlipidemia LDL goal < 100   . Hypertension   . Hypothyroidism   . Malignant neoplasm of thyroid gland ([place])   . Nontoxic uninodular goiter   . Other malaise and fatigue   . Other symptoms involving cardiovascular system   . Phlebitis and thrombophlebitis of unspecified site   . Senile osteoporosis   . Sleep related leg cramps   . Stroke ([place])   . Unspecified disorder of lipoid metabolism   . Unspecified hereditary and idiopathic peripheral neuropathy   . Varicose veins of lower extremities with inflammation     PAST SURGICAL HISTORY: Past Surgical History:  Procedure Laterality Date  . ABDOMINAL HYSTERECTOMY     DR HUGES  . BREAST BIOPSY Left 05/21/2016   malignant  . CATRACT SURGERY  2008  . COLON SURGERY     clipped polyp during colonoscopy  . EXCISION OF KELOID Left 02/19/2020   Procedure: EXCISION NODULE LEFT CHEST WALL;  Surgeon: Jovita Kussmaul, MD;  Location: [facility];  Service: General;  Laterality: Left;  . KNEE SURGERY     right  . MASTECTOMY MODIFIED RADICAL Left 09/10/2019   Procedure: LEFT MASTECTOMY MODIFIED RADICAL;  Surgeon: Jovita Kussmaul, MD;  Location: [facility];  Service: General;  Laterality: Left;  . PARATHYROID ADENOMA REMOVED  1983  . THYROID SURGERY  July 20, 2011    FAMILY  HISTORY Family History  Problem Relation Age of Onset  . Diabetes Brother    The patient's father died from liver problems in his 19s. The patient's mother died in her 58s from "natural causes". The patient had no brothers. She had 6 sisters, 2 of whom died from complications of diabetes and one from a ruptured aneurysm. There is no history of  breast or ovarian cancer in the family.    GYNECOLOGIC HISTORY:  No LMP recorded. Patient has had a hysterectomy.  Shelia Marks does not remember how she was when she had her first period. She never carried a child to term. She is status post hysterectomy and at least one ovary was removed. She never took hormone replacement.    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  She is widowed and lives with her sister.   ADVANCED DIRECTIVES: [name], Shelia Marks, is her healthcare power of attorney. Shelia Marks can be reached at [phone].   HEALTH MAINTENANCE: Social History   Tobacco Use  . Smoking status: Never Smoker  . Smokeless tobacco: Never Used  Vaping Use  . Vaping Use: Never used  Substance Use Topics  . Alcohol use: No  . Drug use: No     No Known Allergies  Current Outpatient Medications  Medication Sig Dispense Refill  . acetaminophen (TYLENOL) 325 MG tablet Take 2 tablets (650 mg total) by mouth every 6 (six) hours as needed for mild pain, moderate pain, fever or headache.    . alendronate (FOSAMAX) 70 MG tablet TAKE 1 TABLET (70 MG TOTAL) EVERY 7 (SEVEN) DAYS. TAKE WITH A FULL GLASS OF WATER ON AN EMPTY STOMACH. 12 tablet 1  . allopurinol (ZYLOPRIM) 300 MG tablet TAKE 1 TABLET EVERY DAY 90 tablet 1  . amLODipine (NORVASC) 10 MG tablet TAKE 1 TABLET EVERY DAY 90 tablet 1  . aspirin EC 81 MG tablet Take 1 tablet (81 mg total) by mouth daily. 30 tablet 3  . atenolol (TENORMIN) 25 MG tablet TAKE 1 TABLET EVERY DAY 90 tablet 1  . atorvastatin (LIPITOR) 40 MG tablet TAKE 1 TABLET EVERY DAY 90 tablet 0  . fulvestrant (FASLODEX) 250 MG/5ML injection Inject 500 mg into the muscle once. One injection each buttock over 1-2 minutes. Warm prior to use.    . levothyroxine (SYNTHROID) 112 MCG tablet TAKE 1 TABLET BY MOUTH EVERY DAY 90 tablet 3  . lisinopril (ZESTRIL) 2.5 MG tablet TAKE 1 TABLET DAILY TO CONTROL BLOOD PRESSURE 90  tablet 1  . magnesium hydroxide (MILK OF MAGNESIA) 400 MG/5ML suspension Take 5-10 mLs by mouth daily as needed for mild constipation.    . sertraline (ZOLOFT) 50 MG tablet TAKE ONE TABLET DAILY FOR DEPRESSION 90 tablet 1   No current facility-administered medications for this visit.    OBJECTIVE:  Vitals:   01/11/21 1151  BP: (!) 157/69  Pulse: 92  Resp: 18  Temp: 97.7 F (36.5 C)  SpO2: 100%     Body mass index is 27.32 kg/m.    ECOG FS:2 - Symptomatic, <50% confined to bed GENERAL: Patient is an older woman examined in wheelchair  in no acute distress HEENT:  Sclerae anicteric.  Mask in place. Neck is supple.  NODES:  No cervical, supraclavicular, or axillary lymphadenopathy palpated.  BREAST EXAM:  Exam limited by patient in wheelchair, s/p left mastectomy with radiation and significant scarring present LUNGS:  Clear to auscultation bilaterally.  No wheezes or  rhonchi. HEART:  Regular rate and rhythm. No murmur appreciated. ABDOMEN:  Soft, nontender.  Positive, normoactive bowel sounds. No organomegaly palpated. MSK:  No focal spinal tenderness to palpation. Full range of motion bilaterally in the upper extremities. EXTREMITIES:  No peripheral edema.   SKIN:  Clear with no obvious rashes or skin changes. No nail dyscrasia. NEURO:  Nonfocal. Well oriented.  Appropriate affect.    LAB RESULTS:  CMP     Component Value Date/Time   NA 142 01/11/2021 1102   NA 143 06/01/2016 0958   K 4.2 01/11/2021 1102   CL 108 01/11/2021 1102   CO2 27 01/11/2021 1102   GLUCOSE 99 01/11/2021 1102   BUN 13 01/11/2021 1102   BUN 21 06/01/2016 0958   CREATININE 0.87 01/11/2021 1102   CREATININE 1.13 (H) 10/03/2020 1541   CALCIUM 10.2 01/11/2021 1102   PROT 7.8 01/11/2021 1102   PROT 6.8 11/09/2015 1013   ALBUMIN 3.7 01/11/2021 1102   ALBUMIN 4.1 11/09/2015 1013   AST 47 (H) 01/11/2021 1102   ALT 21 01/11/2021 1102   ALKPHOS 67 01/11/2021 1102   BILITOT 0.8 01/11/2021 1102    GFRNONAA >60 01/11/2021 1102   GFRNONAA 42 (L) 10/03/2020 1541   GFRAA 49 (L) 10/03/2020 1541    INo results found for: SPEP, UPEP  Lab Results  Component Value Date   WBC 4.1 01/11/2021   NEUTROABS 2.7 01/11/2021   HGB 11.6 (L) 01/11/2021   HCT 37.2 01/11/2021   MCV 96.1 01/11/2021   PLT 186 01/11/2021      Chemistry      Component Value Date/Time   NA 142 01/11/2021 1102   NA 143 06/01/2016 0958   K 4.2 01/11/2021 1102   CL 108 01/11/2021 1102   CO2 27 01/11/2021 1102   BUN 13 01/11/2021 1102   BUN 21 06/01/2016 0958   CREATININE 0.87 01/11/2021 1102   CREATININE 1.13 (H) 10/03/2020 1541      Component Value Date/Time   CALCIUM 10.2 01/11/2021 1102   ALKPHOS 67 01/11/2021 1102   AST 47 (H) 01/11/2021 1102   ALT 21 01/11/2021 1102   BILITOT 0.8 01/11/2021 1102       No results found for: LABCA2  No components found for: LABCA125  No results for input(s): INR in the last 168 hours.  Urinalysis    Component Value Date/Time   COLORURINE YELLOW 05/23/2020 1454   APPEARANCEUR CLEAR 05/23/2020 1454   LABSPEC 1.016 05/23/2020 1454   PHURINE 6.0 05/23/2020 1454   GLUCOSEU NEGATIVE 05/23/2020 1454   [place] 05/23/2020 [place] 05/23/2020 [place] 05/23/2020 1454   PROTEINUR NEGATIVE 05/23/2020 1454   UROBILINOGEN 0.2 03/05/2014 2055   NITRITE NEGATIVE 05/23/2020 1454   LEUKOCYTESUR SMALL (A) 05/23/2020 1454    STUDIES: No results found.   ELIGIBLE FOR AVAILABLE RESEARCH PROTOCOL: no   ASSESSMENT: 87 y.o.  woman status post left breast upper outer quadrant biopsy 05/21/2016 for ductal carcinoma in situ, high-grade, estrogen and progesterone receptor positive  (1) opted against surgery   (2) started anastrozole neoadjuvantly 06/13/2016, discontinued June 2018 due to osteoporosis concerns  (a)  bone density 11/16/2016 shows a T score of -4.7 (osteoporosis)-on fosamax.   (3)  mammography/ultrasonography 06/10/2018 shows evidence of disease progression, possible lymph node extension  (4) letrozole started 06/27/2018   (a) discontinued November 2019 with evidence of progression  (b) restarted 09/2019, to change to Exemestane in 12/2019  (5) fulvestrant  started 11/21/2018, discontinued 03/12/2019  (a) mammography and ultrasonography 07/24/2019 shows evidence of disease progression  (6) Left modified radical mastectomy on 09/10/2019: showing a stage IIIA, T2, N2a, invasive ductal carcinoma, grade 3, margins negative, estrogen receptor positive, progesterone receptor and HER-2 negative, with an MIB-1 of 90%  (a) 4 out of 14 lymph nodes positive for macrometastases  (b) 1 out of 14 lymph nodes positive for micrometastases  (c) CT chest on 08/26/2019 negative for metastatic disease  (7) Adjuvant radiation recommended on 09/24/2019 and again on10/09/2019, declined by patient  (8) removal of a left chest wall lesion 02/19/2020 showing metastatic breast cancer, estrogen receptor 95% and strongly positive, progesterone receptor negative, HER-2 negative with an MIB-1 of 50%  (a) patient and sister again opted against adjuvant radiation at 03/07/2020 visit  (b) additional lesions noted clincially 07/07/2020  (c) adjuvant radiation to left chest wall from 08/09/2020 through 08/22/2020  (9) exemestane started December 2020 through 07/2020, discontinued due to progression  (b) began Fulvestrant on 08/17/2020   PLAN: Shelia Marks continues on Fulvestrant injections every 4 weeks which are tolerated quite well.  She will continue this for the time being.  She has a worsening dementia and forgetfulness.  Her daughter notes behavior changes as well.   She keeps noting a left chest wall pain.  I recommended that she undergo a CT scan.  I talked with Shelia Marks about it, and she was confused.  Her sister, who is her HCPOA didn't want to make the decision (understandably).  After discussion, we  decided to wait 4 weeks until her appt with Dr. Jana Marks.  Her sister knows to call if she starts complaining of this left side pain more frequently and I will go ahead and order the scans.    Shelia Marks will return in 4 weeks for labs, f/u with Dr. Jana Marks, and her next injection.  They know to call us for any other issue that may develop before then  Total encounter time: 30 minutes*   Wilber Bihari, NP 01/11/21 12:48 PM Medical Oncology and Hematology [facility] [place], [place] [address] Tel. [phone]    Fax. [phone]     *Total Encounter Time as defined by the Centers for Medicare and Medicaid Services includes, in addition to the face-to-face time of a patient visit (documented in the note above) non-face-to-face time: obtaining and reviewing outside history, ordering and reviewing medications, tests or procedures, care coordination (communications with other health care professionals or caregivers) and documentation in the medical record.

## 2021-01-11 NOTE — Patient Instructions (Signed)
Fulvestrant injection What is this medicine? FULVESTRANT (ful VES trant) blocks the effects of estrogen. It is used to treat breast cancer. This medicine may be used for other purposes; ask your health care provider or pharmacist if you have questions. COMMON BRAND NAME(S): FASLODEX What should I tell my health care provider before I take this medicine? They need to know if you have any of these conditions:  bleeding disorders  liver disease  low blood counts, like low white cell, platelet, or red cell counts  an unusual or allergic reaction to fulvestrant, other medicines, foods, dyes, or preservatives  pregnant or trying to get pregnant  breast-feeding How should I use this medicine? This medicine is for injection into a muscle. It is usually given by a health care professional in a hospital or clinic setting. Talk to your pediatrician regarding the use of this medicine in children. Special care may be needed. Overdosage: If you think you have taken too much of this medicine contact a poison control center or emergency room at once. NOTE: This medicine is only for you. Do not share this medicine with others. What if I miss a dose? It is important not to miss your dose. Call your doctor or health care professional if you are unable to keep an appointment. What may interact with this medicine?  medicines that treat or prevent blood clots like warfarin, enoxaparin, dalteparin, apixaban, dabigatran, and rivaroxaban This list may not describe all possible interactions. Give your health care provider a list of all the medicines, herbs, non-prescription drugs, or dietary supplements you use. Also tell them if you smoke, drink alcohol, or use illegal drugs. Some items may interact with your medicine. What should I watch for while using this medicine? Your condition will be monitored carefully while you are receiving this medicine. You will need important blood work done while you are taking  this medicine. Do not become pregnant while taking this medicine or for at least 1 year after stopping it. Women of child-bearing potential will need to have a negative pregnancy test before starting this medicine. Women should inform their doctor if they wish to become pregnant or think they might be pregnant. There is a potential for serious side effects to an unborn child. Men should inform their doctors if they wish to father a child. This medicine may lower sperm counts. Talk to your health care professional or pharmacist for more information. Do not breast-feed an infant while taking this medicine or for 1 year after the last dose. What side effects may I notice from receiving this medicine? Side effects that you should report to your doctor or health care professional as soon as possible:  allergic reactions like skin rash, itching or hives, swelling of the face, lips, or tongue  feeling faint or lightheaded, falls  pain, tingling, numbness, or weakness in the legs  signs and symptoms of infection like fever or chills; cough; flu-like symptoms; sore throat  vaginal bleeding Side effects that usually do not require medical attention (report to your doctor or health care professional if they continue or are bothersome):  aches, pains  constipation  diarrhea  headache  hot flashes  nausea, vomiting  pain at site where injected  stomach pain This list may not describe all possible side effects. Call your doctor for medical advice about side effects. You may report side effects to FDA at 1-800-FDA-1088. Where should I keep my medicine? This drug is given in a hospital or clinic and will   not be stored at home. NOTE: This sheet is a summary. It may not cover all possible information. If you have questions about this medicine, talk to your doctor, pharmacist, or health care provider.  2021 Elsevier/Gold Standard (2018-03-27 11:34:41)  

## 2021-01-12 ENCOUNTER — Telehealth: Payer: Self-pay | Admitting: Oncology

## 2021-01-12 NOTE — Telephone Encounter (Signed)
Scheduled appts per 1/12 los. Pt confirmed appt date and time.

## 2021-02-02 ENCOUNTER — Other Ambulatory Visit: Payer: Self-pay | Admitting: Internal Medicine

## 2021-02-07 NOTE — Progress Notes (Signed)
[place]  [phone]) [phone] [phone]) [phone]    ID: Shelia Marks DOB: [birth year]  MR#: [id number]  PZW#:[id number]  Patient Care Team: Gayland Curry, DO as PCP - General (Geriatric Medicine) Rutherford Guys, MD as Consulting Physician (Ophthalmology) Katherin Ramey, Virgie Dad, MD as Consulting Physician (Oncology) Jovita Kussmaul, MD as Consulting Physician (General Surgery) Jacelyn Pi, MD as Consulting Physician (Endocrinology) OTHER MD:   CHIEF COMPLAINT: Estrogen receptor positive breast cancer (s/p left mastectomy)  CURRENT TREATMENT: Fulvestrant;   INTERVAL HISTORY: Shelia Marks returns today for follow-up and treatment of her estrogen receptor positive breast cancer, accompanied by her sister (who is her 36).   Cari receives Fulvestrant every 4 weeks. She does not report any symptoms from this. Her sister denies any issues that Shelia Marks reports after receiving this due to injection site soreness.    REVIEW OF SYSTEMS: Shelia Marks tells me she does not feel well.  She says she is "on the way out".  She just does not feel good.  There is nothing specific.  When asked where she hurts she points to the right arm which is where she had blood drawn today.  The sister says at home she gets out of bed may be at 10 in the morning, has a little breakfast, sometimes gets back into bed, spends most of the afternoon sitting watching TV.  Just going from 1 room to the other is about more than she can do and getting here is really very difficult and takes several hours of preparation according to the sister.  For pain the patient takes occasional Tylenol.  A detailed review of systems was otherwise stable.   COVID 19 VACCINATION STATUS: Status post [place] x2, most recently February 2021   BREAST CANCER HISTORY: From the original intake note:  "Shelia Marks" had bilateral screening mammography at the [facility] 06/07/2016 showing calcifications in the left  breast. She was recalled  05/15/2016 for left diagnostic mammography. This showed the breast density to be category be. In the upper outer left breast there was an area of suspicious calcifications measuring 1.7 cm.  Biopsy of this area was obtained 05/21/2016, and showed ([id number]) ductal carcinoma in situ, high-grade, estrogen receptor 100% positive, progesterone receptor 5% positive, WITH strong staining intensity.  Her subsequent history is as detailed below.   PAST MEDICAL HISTORY: Past Medical History:  Diagnosis Date  . Alzheimer's disease ([place])   . Cancer ([place])   . Disorder of bone and cartilage, unspecified   . Dizziness and giddiness   . Hyperlipidemia LDL goal < 100   . Hypertension   . Hypothyroidism   . Malignant neoplasm of thyroid gland ([place])   . Nontoxic uninodular goiter   . Other malaise and fatigue   . Other symptoms involving cardiovascular system   . Phlebitis and thrombophlebitis of unspecified site   . Senile osteoporosis   . Sleep related leg cramps   . Stroke ([place])   . Unspecified disorder of lipoid metabolism   . Unspecified hereditary and idiopathic peripheral neuropathy   . Varicose veins of lower extremities with inflammation     PAST SURGICAL HISTORY: Past Surgical History:  Procedure Laterality Date  . ABDOMINAL HYSTERECTOMY     DR HUGES  . BREAST BIOPSY Left 05/21/2016   malignant  . CATRACT SURGERY  2008  . COLON SURGERY     clipped polyp during colonoscopy  . EXCISION OF KELOID Left 02/19/2020   Procedure: EXCISION NODULE LEFT CHEST WALL;  Surgeon: Marlou Starks,  Sena Hitch, MD;  Location: [facility];  Service: General;  Laterality: Left;  . KNEE SURGERY     right  . MASTECTOMY MODIFIED RADICAL Left 09/10/2019   Procedure: LEFT MASTECTOMY MODIFIED RADICAL;  Surgeon: Jovita Kussmaul, MD;  Location: [facility];  Service: General;  Laterality: Left;  . PARATHYROID ADENOMA REMOVED  1983  . THYROID SURGERY  July 20, 2011    FAMILY  HISTORY Family History  Problem Relation Age of Onset  . Diabetes Brother   The patient's father died from liver problems in his 35s. The patient's mother died in her 78s from "natural causes". The patient had no brothers. She had 6 sisters, 2 of whom died from complications of diabetes and one from a ruptured aneurysm. There is no history of breast or ovarian cancer in the family.    GYNECOLOGIC HISTORY:  No LMP recorded. Patient has had a hysterectomy.  Shelia Marks does not remember how old she was when she had her first period. She never carried a child to term. She is status post hysterectomy and at least one ovary was removed. She never took hormone replacement.    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  She is widowed and lives with her sister.   ADVANCED DIRECTIVES: [name], Shelia Marks, is her healthcare power of attorney. Shelia Marks can be reached at [phone].   HEALTH MAINTENANCE: Social History   Tobacco Use  . Smoking status: Never Smoker  . Smokeless tobacco: Never Used  Vaping Use  . Vaping Use: Never used  Substance Use Topics  . Alcohol use: No  . Drug use: No     No Known Allergies  Current Outpatient Medications  Medication Sig Dispense Refill  . predniSONE (DELTASONE) 5 MG tablet Take 1 tablet (5 mg total) by mouth daily with breakfast. 40 tablet 3  . acetaminophen (TYLENOL) 325 MG tablet Take 2 tablets (650 mg total) by mouth every 6 (six) hours as needed for mild pain, moderate pain, fever or headache.    . alendronate (FOSAMAX) 70 MG tablet TAKE 1 TABLET (70 MG TOTAL) EVERY 7 (SEVEN) DAYS. TAKE WITH A FULL GLASS OF WATER ON AN EMPTY STOMACH. 12 tablet 1  . allopurinol (ZYLOPRIM) 300 MG tablet TAKE 1 TABLET EVERY DAY 90 tablet 1  . amLODipine (NORVASC) 10 MG tablet TAKE 1 TABLET EVERY DAY 90 tablet 1  . aspirin EC 81 MG tablet Take 1 tablet (81 mg total) by mouth daily. 30 tablet 3  . atenolol  (TENORMIN) 25 MG tablet TAKE 1 TABLET EVERY DAY 90 tablet 1  . atorvastatin (LIPITOR) 40 MG tablet TAKE 1 TABLET EVERY DAY 90 tablet 0  . fulvestrant (FASLODEX) 250 MG/5ML injection Inject 500 mg into the muscle once. One injection each buttock over 1-2 minutes. Warm prior to use.    . levothyroxine (SYNTHROID) 112 MCG tablet TAKE 1 TABLET BY MOUTH EVERY DAY 90 tablet 3  . lisinopril (ZESTRIL) 2.5 MG tablet TAKE 1 TABLET DAILY TO CONTROL BLOOD PRESSURE 90 tablet 1  . magnesium hydroxide (MILK OF MAGNESIA) 400 MG/5ML suspension Take 5-10 mLs by mouth daily as needed for mild constipation.    . sertraline (ZOLOFT) 50 MG tablet TAKE ONE TABLET DAILY FOR DEPRESSION 90 tablet 1   No current facility-administered medications for this visit.    OBJECTIVE: African-American woman examined in a wheelchair Vitals:   02/08/21 1156  BP: (!) 182/74  Pulse: 64  Resp: 17  Temp: 98.1 F (36.7 C)  SpO2: 100%     Body mass index is 27.32 kg/m.    ECOG FS:2 - Symptomatic, <50% confined to bed  Sclerae unicteric, EOMs intact Lungs no rales or rhonchi Heart regular rate and rhythm Abd soft, nontender, positive bowel sounds MSK no focal spinal tenderness, no upper extremity lymphedema, no abnormality or tenderness noted in the right upper extremity Neuro: nonfocal Breasts: The right breast is unremarkable.  The left breast is status post mastectomy and radiation.  The scar is very irregular and hyperpigmented.  I did not inspect or palpate any evidence of local recurrence.  Both axillae are benign   LAB RESULTS:  CMP     Component Value Date/Time   NA 142 02/08/2021 1116   NA 143 06/01/2016 0958   K 3.9 02/08/2021 1116   CL 109 02/08/2021 1116   CO2 27 02/08/2021 1116   GLUCOSE 96 02/08/2021 1116   BUN 18 02/08/2021 1116   BUN 21 06/01/2016 0958   CREATININE 1.04 (H) 02/08/2021 1116   CREATININE 1.13 (H) 10/03/2020 1541   CALCIUM 9.9 02/08/2021 1116   PROT 7.4 02/08/2021 1116   PROT 6.8  11/09/2015 1013   ALBUMIN 3.7 02/08/2021 1116   ALBUMIN 4.1 11/09/2015 1013   AST 53 (H) 02/08/2021 1116   ALT 24 02/08/2021 1116   ALKPHOS 65 02/08/2021 1116   BILITOT 0.6 02/08/2021 1116   GFRNONAA 50 (L) 02/08/2021 1116   GFRNONAA 42 (L) 10/03/2020 1541   GFRAA 49 (L) 10/03/2020 1541    INo results found for: SPEP, UPEP  Lab Results  Component Value Date   WBC 3.4 (L) 02/08/2021   NEUTROABS 2.3 02/08/2021   HGB 11.7 (L) 02/08/2021   HCT 37.1 02/08/2021   MCV 94.4 02/08/2021   PLT 196 02/08/2021      Chemistry      Component Value Date/Time   NA 142 02/08/2021 1116   NA 143 06/01/2016 0958   K 3.9 02/08/2021 1116   CL 109 02/08/2021 1116   CO2 27 02/08/2021 1116   BUN 18 02/08/2021 1116   BUN 21 06/01/2016 0958   CREATININE 1.04 (H) 02/08/2021 1116   CREATININE 1.13 (H) 10/03/2020 1541      Component Value Date/Time   CALCIUM 9.9 02/08/2021 1116   ALKPHOS 65 02/08/2021 1116   AST 53 (H) 02/08/2021 1116   ALT 24 02/08/2021 1116   BILITOT 0.6 02/08/2021 1116       No results found for: LABCA2  No components found for: LABCA125  No results for input(s): INR in the last 168 hours.  Urinalysis    Component Value Date/Time   COLORURINE YELLOW 05/23/2020 1454   APPEARANCEUR CLEAR 05/23/2020 1454   LABSPEC 1.016 05/23/2020 1454   PHURINE 6.0 05/23/2020 1454   GLUCOSEU NEGATIVE 05/23/2020 1454   [place] 05/23/2020 [place] 05/23/2020 [place] 05/23/2020 1454   PROTEINUR NEGATIVE 05/23/2020 1454   UROBILINOGEN 0.2 03/05/2014 2055   NITRITE NEGATIVE 05/23/2020 1454   LEUKOCYTESUR SMALL (A) 05/23/2020 1454    STUDIES: No results found.   ELIGIBLE FOR AVAILABLE RESEARCH PROTOCOL: no   ASSESSMENT: 87 y.o. [place] woman status post left breast upper outer quadrant biopsy 05/21/2016 for ductal carcinoma in situ, high-grade, estrogen and progesterone receptor positive  (1) opted against surgery   (2)  started anastrozole neoadjuvantly 06/13/2016, discontinued June 2018 due to osteoporosis concerns  (a)  bone density 11/16/2016 shows a T score of -4.7 (osteoporosis)-on fosamax.   (3) mammography/ultrasonography 06/10/2018 shows evidence of disease progression, possible lymph node extension  (4) letrozole started 06/27/2018   (a) discontinued November 2019 with evidence of progression  (b) restarted 09/2019, to change to Exemestane in 12/2019  (5) fulvestrant started 11/21/2018, discontinued 03/12/2019  (a) mammography and ultrasonography 07/24/2019 shows evidence of disease progression  (6) Left modified radical mastectomy on 09/10/2019: showing a stage IIIA, T2, N2a, invasive ductal carcinoma, grade 3, margins negative, estrogen receptor positive, progesterone receptor and HER-2 negative, with an MIB-1 of 90%  (a) 4 out of 14 lymph nodes positive for macrometastases  (b) 1 out of 14 lymph nodes positive for micrometastases  (c) CT chest on 08/26/2019 negative for metastatic disease  (7) Adjuvant radiation recommended on 09/24/2019 and again on10/09/2019, declined by patient  (8) removal of a left chest wall lesion 02/19/2020 showing metastatic breast cancer, estrogen receptor 95% and strongly positive, progesterone receptor negative, HER-2 negative with an MIB-1 of 50%  (a) patient and sister again opted against adjuvant radiation at 03/07/2020 visit  (b) additional lesions noted clincially 07/07/2020  (c) adjuvant radiation to left chest wall from 08/09/2020 through 08/22/2020  (9) exemestane started December 2020 through 07/2020, discontinued due to progression  (b) began fulvestrant on 08/17/2020   PLAN: Adna tolerates fulvestrant well and there is no evidence of disease recurrence or progression.  However getting here is a real problem for the sister.  It takes many hours and it makes the patient very uncomfortable.  Unfortunately Hubert has had all 3 aromatase inhibitors and she  is really not a good candidate for tamoxifen given her history of strokes.  She will receive fulvestrant today but we are going to hold the next treatment a month from now.  Instead we will do a virtual visit and see how things are going.  In the meantime I am going to try to improve her functional status if possible by giving her low-dose prednisone at breakfast.  I am hoping this generally makes her feel better, improves her appetite, and takes away some of the arthritis discomfort that she is feeling.  It is not clear whether we will resume fulvestrant after that.  We will discuss it further at that time.  Total encounter time 25 minutes.Sarajane Jews C. Anant Agard, MD 02/08/21 12:17 PM Medical Oncology and Hematology [facility] [place], [place] [address] Tel. [phone]    Fax. [phone]   I, Wilburn Mylar, am acting as scribe for Dr. Virgie Dad. Lema Heinkel.  I, Lurline Del MD, have reviewed the above documentation for accuracy and completeness, and I agree with the above.    *Total Encounter Time as defined by the Centers for Medicare and Medicaid Services includes, in addition to the face-to-face time of a patient visit (documented in the note above) non-face-to-face time: obtaining and reviewing outside history, ordering and reviewing medications, tests or procedures, care coordination (communications with other health care professionals or caregivers) and documentation in the medical record.

## 2021-02-08 ENCOUNTER — Other Ambulatory Visit: Payer: Self-pay

## 2021-02-08 ENCOUNTER — Inpatient Hospital Stay: Payer: Medicare Other | Attending: Oncology

## 2021-02-08 ENCOUNTER — Inpatient Hospital Stay (HOSPITAL_BASED_OUTPATIENT_CLINIC_OR_DEPARTMENT_OTHER): Payer: Medicare Other | Admitting: Oncology

## 2021-02-08 ENCOUNTER — Inpatient Hospital Stay: Payer: Medicare Other

## 2021-02-08 ENCOUNTER — Other Ambulatory Visit: Payer: Self-pay | Admitting: Internal Medicine

## 2021-02-08 VITALS — BP 182/74 | HR 64 | Temp 98.1°F | Resp 17 | Ht 61.0 in

## 2021-02-08 DIAGNOSIS — Z5111 Encounter for antineoplastic chemotherapy: Secondary | ICD-10-CM | POA: Insufficient documentation

## 2021-02-08 DIAGNOSIS — D631 Anemia in chronic kidney disease: Secondary | ICD-10-CM | POA: Diagnosis not present

## 2021-02-08 DIAGNOSIS — F0150 Vascular dementia without behavioral disturbance: Secondary | ICD-10-CM | POA: Diagnosis not present

## 2021-02-08 DIAGNOSIS — C50912 Malignant neoplasm of unspecified site of left female breast: Secondary | ICD-10-CM | POA: Diagnosis not present

## 2021-02-08 DIAGNOSIS — Z170 Estrogen receptor positive status [ER+]: Secondary | ICD-10-CM | POA: Diagnosis not present

## 2021-02-08 DIAGNOSIS — E039 Hypothyroidism, unspecified: Secondary | ICD-10-CM | POA: Insufficient documentation

## 2021-02-08 DIAGNOSIS — M810 Age-related osteoporosis without current pathological fracture: Secondary | ICD-10-CM

## 2021-02-08 DIAGNOSIS — D0512 Intraductal carcinoma in situ of left breast: Secondary | ICD-10-CM

## 2021-02-08 DIAGNOSIS — N183 Chronic kidney disease, stage 3 unspecified: Secondary | ICD-10-CM

## 2021-02-08 DIAGNOSIS — C50812 Malignant neoplasm of overlapping sites of left female breast: Secondary | ICD-10-CM

## 2021-02-08 DIAGNOSIS — C50412 Malignant neoplasm of upper-outer quadrant of left female breast: Secondary | ICD-10-CM | POA: Diagnosis not present

## 2021-02-08 DIAGNOSIS — C7989 Secondary malignant neoplasm of other specified sites: Secondary | ICD-10-CM

## 2021-02-08 LAB — CMP (CANCER CENTER ONLY)
ALT: 24 U/L (ref 0–44)
AST: 53 U/L — ABNORMAL HIGH (ref 15–41)
Albumin: 3.7 g/dL (ref 3.5–5.0)
Alkaline Phosphatase: 65 U/L (ref 38–126)
Anion gap: 6 (ref 5–15)
BUN: 18 mg/dL (ref 8–23)
CO2: 27 mmol/L (ref 22–32)
Calcium: 9.9 mg/dL (ref 8.9–10.3)
Chloride: 109 mmol/L (ref 98–111)
Creatinine: 1.04 mg/dL — ABNORMAL HIGH (ref 0.44–1.00)
GFR, Estimated: 50 mL/min — ABNORMAL LOW (ref >60)
Glucose, Bld: 96 mg/dL (ref 70–99)
Potassium: 3.9 mmol/L (ref 3.5–5.1)
Sodium: 142 mmol/L (ref 135–145)
Total Bilirubin: 0.6 mg/dL (ref 0.3–1.2)
Total Protein: 7.4 g/dL (ref 6.5–8.1)

## 2021-02-08 LAB — CBC WITH DIFFERENTIAL (CANCER CENTER ONLY)
Abs Immature Granulocytes: 0.01 K/uL (ref 0.00–0.07)
Basophils Absolute: 0 K/uL (ref 0.0–0.1)
Basophils Relative: 1 %
Eosinophils Absolute: 0 K/uL (ref 0.0–0.5)
Eosinophils Relative: 1 %
HCT: 37.1 % (ref 36.0–46.0)
Hemoglobin: 11.7 g/dL — ABNORMAL LOW (ref 12.0–15.0)
Immature Granulocytes: 0 %
Lymphocytes Relative: 24 %
Lymphs Abs: 0.8 K/uL (ref 0.7–4.0)
MCH: 29.8 pg (ref 26.0–34.0)
MCHC: 31.5 g/dL (ref 30.0–36.0)
MCV: 94.4 fL (ref 80.0–100.0)
Monocytes Absolute: 0.3 K/uL (ref 0.1–1.0)
Monocytes Relative: 7 %
Neutro Abs: 2.3 K/uL (ref 1.7–7.7)
Neutrophils Relative %: 67 %
Platelet Count: 196 K/uL (ref 150–400)
RBC: 3.93 MIL/uL (ref 3.87–5.11)
RDW: 14.8 % (ref 11.5–15.5)
WBC Count: 3.4 K/uL — ABNORMAL LOW (ref 4.0–10.5)
nRBC: 0 % (ref 0.0–0.2)

## 2021-02-08 MED ORDER — FULVESTRANT 250 MG/5ML IM SOLN
INTRAMUSCULAR | Status: AC
  Filled 2021-02-08: qty 10

## 2021-02-08 MED ORDER — PREDNISONE 5 MG PO TABS: 5.0000 mg | ORAL_TABLET | Freq: Every day | ORAL | 3 refills | Status: AC

## 2021-02-08 MED ORDER — FULVESTRANT 250 MG/5ML IM SOLN
500.0000 mg | Freq: Once | INTRAMUSCULAR | Status: AC
  Administered 2021-02-08: 500 mg via INTRAMUSCULAR

## 2021-02-08 NOTE — Addendum Note (Signed)
Addended by: Chauncey Cruel on: 02/08/2021 12:56 PM   Modules accepted: Orders

## 2021-02-08 NOTE — Patient Instructions (Signed)
Fulvestrant injection What is this medicine? FULVESTRANT (ful VES trant) blocks the effects of estrogen. It is used to treat breast cancer. This medicine may be used for other purposes; ask your health care provider or pharmacist if you have questions. COMMON BRAND NAME(S): FASLODEX What should I tell my health care provider before I take this medicine? They need to know if you have any of these conditions:  bleeding disorders  liver disease  low blood counts, like low white cell, platelet, or red cell counts  an unusual or allergic reaction to fulvestrant, other medicines, foods, dyes, or preservatives  pregnant or trying to get pregnant  breast-feeding How should I use this medicine? This medicine is for injection into a muscle. It is usually given by a health care professional in a hospital or clinic setting. Talk to your pediatrician regarding the use of this medicine in children. Special care may be needed. Overdosage: If you think you have taken too much of this medicine contact a poison control center or emergency room at once. NOTE: This medicine is only for you. Do not share this medicine with others. What if I miss a dose? It is important not to miss your dose. Call your doctor or health care professional if you are unable to keep an appointment. What may interact with this medicine?  medicines that treat or prevent blood clots like warfarin, enoxaparin, dalteparin, apixaban, dabigatran, and rivaroxaban This list may not describe all possible interactions. Give your health care provider a list of all the medicines, herbs, non-prescription drugs, or dietary supplements you use. Also tell them if you smoke, drink alcohol, or use illegal drugs. Some items may interact with your medicine. What should I watch for while using this medicine? Your condition will be monitored carefully while you are receiving this medicine. You will need important blood work done while you are taking  this medicine. Do not become pregnant while taking this medicine or for at least 1 year after stopping it. Women of child-bearing potential will need to have a negative pregnancy test before starting this medicine. Women should inform their doctor if they wish to become pregnant or think they might be pregnant. There is a potential for serious side effects to an unborn child. Men should inform their doctors if they wish to father a child. This medicine may lower sperm counts. Talk to your health care professional or pharmacist for more information. Do not breast-feed an infant while taking this medicine or for 1 year after the last dose. What side effects may I notice from receiving this medicine? Side effects that you should report to your doctor or health care professional as soon as possible:  allergic reactions like skin rash, itching or hives, swelling of the face, lips, or tongue  feeling faint or lightheaded, falls  pain, tingling, numbness, or weakness in the legs  signs and symptoms of infection like fever or chills; cough; flu-like symptoms; sore throat  vaginal bleeding Side effects that usually do not require medical attention (report to your doctor or health care professional if they continue or are bothersome):  aches, pains  constipation  diarrhea  headache  hot flashes  nausea, vomiting  pain at site where injected  stomach pain This list may not describe all possible side effects. Call your doctor for medical advice about side effects. You may report side effects to FDA at 1-800-FDA-1088. Where should I keep my medicine? This drug is given in a hospital or clinic and will   not be stored at home. NOTE: This sheet is a summary. It may not cover all possible information. If you have questions about this medicine, talk to your doctor, pharmacist, or health care provider.  2021 Elsevier/Gold Standard (2018-03-27 11:34:41)  

## 2021-02-09 ENCOUNTER — Telehealth: Payer: Self-pay | Admitting: Oncology

## 2021-02-09 ENCOUNTER — Telehealth: Payer: Self-pay

## 2021-02-09 NOTE — Telephone Encounter (Signed)
Scheduled appts per 2/9 los. Pt's sister confirmed appt date and time.

## 2021-02-09 NOTE — Telephone Encounter (Signed)
Incoming call received from patients sister stating patient is too weak to get out and will need to cancel appointment for Monday 02/13/2021.  I offered to change appointment to a telephone visit versus canceling. [name] was in agreement as she has a hard time getting patient dressed and moving. If patient has to be seen in person [name] has no idea when she would be able reschedule.  [name] states patient was seen yesterday at the oncology office and it was a production to get her there. [name] asked that I inform Dr.Reed of this interaction and she will further discuss patients weakness with her on Monday.

## 2021-02-13 ENCOUNTER — Other Ambulatory Visit: Payer: Self-pay

## 2021-02-13 ENCOUNTER — Ambulatory Visit (INDEPENDENT_AMBULATORY_CARE_PROVIDER_SITE_OTHER): Payer: Medicare Other | Admitting: Internal Medicine

## 2021-02-13 ENCOUNTER — Telehealth: Payer: Self-pay

## 2021-02-13 ENCOUNTER — Encounter: Payer: Self-pay | Admitting: Internal Medicine

## 2021-02-13 DIAGNOSIS — Z7189 Other specified counseling: Secondary | ICD-10-CM | POA: Diagnosis not present

## 2021-02-13 DIAGNOSIS — Z170 Estrogen receptor positive status [ER+]: Secondary | ICD-10-CM

## 2021-02-13 DIAGNOSIS — C50812 Malignant neoplasm of overlapping sites of left female breast: Secondary | ICD-10-CM | POA: Diagnosis not present

## 2021-02-13 DIAGNOSIS — C792 Secondary malignant neoplasm of skin: Secondary | ICD-10-CM

## 2021-02-13 DIAGNOSIS — R634 Abnormal weight loss: Secondary | ICD-10-CM | POA: Diagnosis not present

## 2021-02-13 DIAGNOSIS — F0150 Vascular dementia without behavioral disturbance: Secondary | ICD-10-CM

## 2021-02-13 NOTE — Telephone Encounter (Signed)
Ms. Shelia Marks, Shelia Marks are scheduled for a virtual visit with your provider today.    Just as we do with appointments in the office, we must obtain your consent to participate.  Your consent will be active for this visit and any virtual visit you may have with one of our providers in the next 365 days.    If you have a MyChart account, I can also send a copy of this consent to you electronically.  All virtual visits are billed to your insurance company just like a traditional visit in the office.  As this is a virtual visit, video technology does not allow for your provider to perform a traditional examination.  This may limit your provider's ability to fully assess your condition.  If your provider identifies any concerns that need to be evaluated in person or the need to arrange testing such as labs, EKG, etc, we will make arrangements to do so.    Although advances in technology are sophisticated, we cannot ensure that it will always work on either your end or our end.  If the connection with a video visit is poor, we may have to switch to a telephone visit.  With either a video or telephone visit, we are not always able to ensure that we have a secure connection.   I need to obtain your verbal consent now.   Are you willing to proceed with your visit today?   Shelia Marks has provided verbal consent on 02/13/2021 for a virtual visit (video or telephone).   Tanna Savoy, CMA 02/13/2021  1:12 PM

## 2021-02-13 NOTE — Progress Notes (Signed)
This service is provided via telemedicine  No vital signs collected/recorded due to the encounter was a telemedicine visit.   Location of patient (ex: home, work):  Home   Patient consents to a telephone visit:  Yes   Location of the provider (ex: office, home): [place]  Name of any referring provider: Hollace Kinnier DO   Names of all persons participating in the telemedicine service and their role in the encounter:  Dr. Mariea Clonts DO, Shelia Marks (caregiver) Shelia Marks (patient)  Time spent on call: 10 mins    Provider:  Zaylie Marks L. Mariea Marks, D.O., C.M.D.  Code Status: DNR Goals of Care:  Advanced Directives 02/13/2021  Does Patient Have a Medical Advance Directive? Yes  Type of Advance Directive Living will;Out of facility DNR (pink MOST or yellow form)  Does patient want to make changes to medical advance directive? No - Patient declined  Copy of [place] in Chart? -  Would patient like information on creating a medical advance directive? -  Pre-existing out of facility DNR order (yellow form or pink MOST form) Pink MOST/Yellow Form most recent copy in chart - Physician notified to receive inpatient order     Chief Complaint  Patient presents with  . Medical Management of Chronic Issues    4 month follow up     HPI: Patient is a 87 y.o. female seen today for medical management of chronic diseases--phone visit with Shelia Marks and her sister, Shelia Marks.  Shelia Marks has vascular dementia with prior stroke, breast cancer (ductal carcinoma with metastasis to chest wall that was excised, but declined XRT, took exemestane and now on fulvestrant since August of last year.     Shelia Marks has gotten very weak, she's sleeping a lot, no energy.  She'll get up at 8:30am, she'll force herself to eat a little with her medication, stay up 30 mins, then back in bed.  She's dizzy and more mentally confused.  This am, she said she'd take a spongebath before our phone call.  She's pushing herself to do  just a little.  She's losing weight.  They had a rough time going to the doctor this past Wednesday.  He gave her prednisone.  He plans to do the next visit over the phone.  She gets sick.  She has forced herself to stay on the sofa knowing I was calling her.  She has the electric throw around her.    BP was a little up at oncology.  She needs to have a nurse out to help her.  Shelia Marks has to help her to put up her undergarments.  She cannot afford $22/hr for a caregiver.  They don't know is she going to have had fecal or urinary incontinence.  Shelia Marks does not note any improvement on the prednisone.  She told Dr. Jana Marks she was dying when she went to that visit.  She is always talking negative.    She has some pain under her arm.  She stays chilly all of the time.  She's also sleepy.  Not much energy.  She admits to losing some weight.  She does eat.  She takes ensure.  Says she is used to doing things and cannot.  Gets tired easily.  No falls.  Says sometimes her medicine upsets her stomach.  She'll cough and cough and feels like she'll vomit at times.    She was mixed up and asking me about my mother.  She had a man and his child she was seeing at  her bedside (hallucinating).    She's not able to walk with her walker and carry a cup of water anymore.  She can't do anything for herself.  Shelia Marks has to help her put her feet up.  Shelia Marks also has helped her on the weekends.    Her change in personality has been kind of sudden.  Talks about people from the past more.  Even asks where her mother is now and her sister both of whom have been gone for years.    It would have taken from 8am to 1pm for an appt to get ready.  Shelia Marks gets worn out helping Shelia Marks to bathe.  She herself is 80.    Weight was down 144 at oncology visit.   Had been running in the 150s until this past summer.    Past Medical History:  Diagnosis Date  . Alzheimer's disease ([place])   . Cancer ([place])   . Disorder of bone and  cartilage, unspecified   . Dizziness and giddiness   . Hyperlipidemia LDL goal < 100   . Hypertension   . Hypothyroidism   . Malignant neoplasm of thyroid gland ([place])   . Nontoxic uninodular goiter   . Other malaise and fatigue   . Other symptoms involving cardiovascular system   . Phlebitis and thrombophlebitis of unspecified site   . Senile osteoporosis   . Sleep related leg cramps   . Stroke ([place])   . Unspecified disorder of lipoid metabolism   . Unspecified hereditary and idiopathic peripheral neuropathy   . Varicose veins of lower extremities with inflammation     Past Surgical History:  Procedure Laterality Date  . ABDOMINAL HYSTERECTOMY     DR HUGES  . BREAST BIOPSY Left 05/21/2016   malignant  . CATRACT SURGERY  2008  . COLON SURGERY     clipped polyp during colonoscopy  . EXCISION OF KELOID Left 02/19/2020   Procedure: EXCISION NODULE LEFT CHEST WALL;  Surgeon: Jovita Kussmaul, MD;  Location: [place];  Service: General;  Laterality: Left;  . KNEE SURGERY     right  . MASTECTOMY MODIFIED RADICAL Left 09/10/2019   Procedure: LEFT MASTECTOMY MODIFIED RADICAL;  Surgeon: Jovita Kussmaul, MD;  Location: [facility] [facility];  Service: General;  Laterality: Left;  . PARATHYROID ADENOMA REMOVED  1983  . THYROID SURGERY  July 20, 2011    No Known Allergies  Outpatient Encounter Medications as of 02/13/2021  Medication Sig  . acetaminophen (TYLENOL) 325 MG tablet Take 2 tablets (650 mg total) by mouth every 6 (six) hours as needed for mild pain, moderate pain, fever or headache.  . alendronate (FOSAMAX) 70 MG tablet TAKE 1 TABLET (70 MG TOTAL) EVERY 7 (SEVEN) DAYS. TAKE WITH A FULL GLASS OF WATER ON AN EMPTY STOMACH.  Marland Kitchen allopurinol (ZYLOPRIM) 300 MG tablet TAKE 1 TABLET EVERY DAY  . amLODipine (NORVASC) 10 MG tablet TAKE 1 TABLET EVERY DAY  . aspirin EC 81 MG tablet Take 1 tablet (81 mg total) by mouth daily.  Marland Kitchen atenolol (TENORMIN) 25 MG tablet TAKE 1 TABLET EVERY DAY  .  atorvastatin (LIPITOR) 40 MG tablet TAKE 1 TABLET EVERY DAY  . fulvestrant (FASLODEX) 250 MG/5ML injection Inject 500 mg into the muscle once. One injection each buttock over 1-2 minutes. Warm prior to use.  . levothyroxine (SYNTHROID) 112 MCG tablet TAKE 1 TABLET BY MOUTH EVERY DAY  . lisinopril (ZESTRIL) 2.5 MG tablet TAKE 1 TABLET DAILY TO CONTROL BLOOD  PRESSURE  . magnesium hydroxide (MILK OF MAGNESIA) 400 MG/5ML suspension Take 5-10 mLs by mouth daily as needed for mild constipation.  . predniSONE (DELTASONE) 5 MG tablet Take 1 tablet (5 mg total) by mouth daily with breakfast.  . sertraline (ZOLOFT) 50 MG tablet TAKE ONE TABLET DAILY FOR DEPRESSION  . [DISCONTINUED] iron polysaccharides (NIFEREX) 150 MG capsule Take 1 capsule (150 mg total) by mouth every other day. (Patient not taking: Reported on 05/15/2020)   No facility-administered encounter medications on file as of 02/13/2021.    Review of Systems:  ROS  Health Maintenance  Topic Date Due  . COVID-19 Vaccine (4 - Booster for Pfizer series) 07/14/2021  . TETANUS/TDAP  06/23/2028  . INFLUENZA VACCINE  Completed  . DEXA SCAN  Completed  . PNA vac Low Risk Adult  Completed    Physical Exam: Could not be performed as visit non face-to-face via phone   Labs reviewed: Basic Metabolic Panel: Recent Labs    10/03/20 1541 10/19/20 1048 12/14/20 1056 01/11/21 1102 02/08/21 1116  NA 143   < > 148* 142 142  K 3.6   < > 3.7 4.2 3.9  CL 107   < > 117* 108 109  CO2 27   < > 29 27 27   GLUCOSE 88   < > 107* 99 96  BUN 22   < > 15 13 18   CREATININE 1.13*   < > 1.01* 0.87 1.04*  CALCIUM 10.2   < > 9.9 10.2 9.9  TSH 2.50  --   --   --   --    < > = values in this interval not displayed.   Liver Function Tests: Recent Labs    12/14/20 1056 01/11/21 1102 02/08/21 1116  AST 52* 47* 53*  ALT 32 21 24  ALKPHOS 67 67 65  BILITOT 0.9 0.8 0.6  PROT 7.9 7.8 7.4  ALBUMIN 3.7 3.7 3.7   No results for input(s): LIPASE, AMYLASE  in the last 8760 hours. No results for input(s): AMMONIA in the last 8760 hours. CBC: Recent Labs    12/14/20 1056 01/11/21 1102 02/08/21 1116  WBC 3.8* 4.1 3.4*  NEUTROABS 2.4 2.7 2.3  HGB 11.0* 11.6* 11.7*  HCT 35.6* 37.2 37.1  MCV 96.5 96.1 94.4  PLT 174 186 196   Lipid Panel: No results for input(s): CHOL, HDL, LDLCALC, TRIG, CHOLHDL, LDLDIRECT in the last 8760 hours. Lab Results  Component Value Date   HGBA1C 5.0 11/09/2015    Procedures since last visit: No results found.  Assessment/Plan 1. Malignant neoplasm of overlapping sites of left breast in female, estrogen receptor positive ([place]) -just had what sounds like last fulvestrant injection and now on prednisone to help appetite/energy, but not really improved over past week  2. 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 Due to progressing dementia/delirium vs metastatic disease  3. Vascular dementia without behavioral disturbance ([place]) -having some hallucinations and more confusion than before  4. Weight loss -especially since summer -intake down and has to make herself eat a little something before taking pills  5. Advanced care planning/counseling discussion -discussed Katlen's changes with Shelia Marks and recommended hospice care--sent a message to Dr. Jana Marks, as well, to be sure he is on board -Shelia Marks is interested in whatever we feel is best for Kareena at this time and she needs more help caring for her but they  cannot afford out of pocket caregivers  -16 mins on idea of hospice care  Labs/tests ordered:  No new  Next appt:  02/13/2021 Non face-to-face time spent on televisit:  28 minutes  Sherwood Castilla L. Quince Santana, D.O. [facility] [address], [place] [address] Cell Phone (Mon-Fri 8am-5pm):  [phone] On Call:  [phone] & follow prompts  after 5pm & weekends Office Phone:  [phone] Office Fax:  [phone]

## 2021-02-14 NOTE — Addendum Note (Signed)
Addended by: Gayland Curry on: 02/14/2021 07:56 AM   Modules accepted: Orders

## 2021-02-17 DIAGNOSIS — I10 Essential (primary) hypertension: Secondary | ICD-10-CM | POA: Diagnosis not present

## 2021-02-17 DIAGNOSIS — E039 Hypothyroidism, unspecified: Secondary | ICD-10-CM | POA: Diagnosis not present

## 2021-02-17 DIAGNOSIS — M810 Age-related osteoporosis without current pathological fracture: Secondary | ICD-10-CM | POA: Diagnosis not present

## 2021-02-17 DIAGNOSIS — E785 Hyperlipidemia, unspecified: Secondary | ICD-10-CM | POA: Diagnosis not present

## 2021-02-17 DIAGNOSIS — F0150 Vascular dementia without behavioral disturbance: Secondary | ICD-10-CM | POA: Diagnosis not present

## 2021-02-17 DIAGNOSIS — M109 Gout, unspecified: Secondary | ICD-10-CM | POA: Diagnosis not present

## 2021-02-17 DIAGNOSIS — I69818 Other symptoms and signs involving cognitive functions following other cerebrovascular disease: Secondary | ICD-10-CM | POA: Diagnosis not present

## 2021-02-17 DIAGNOSIS — C7989 Secondary malignant neoplasm of other specified sites: Secondary | ICD-10-CM | POA: Diagnosis not present

## 2021-02-17 DIAGNOSIS — C50912 Malignant neoplasm of unspecified site of left female breast: Secondary | ICD-10-CM | POA: Diagnosis not present

## 2021-02-17 DIAGNOSIS — R634 Abnormal weight loss: Secondary | ICD-10-CM | POA: Diagnosis not present

## 2021-02-17 DIAGNOSIS — G629 Polyneuropathy, unspecified: Secondary | ICD-10-CM | POA: Diagnosis not present

## 2021-02-18 ENCOUNTER — Other Ambulatory Visit: Payer: Self-pay | Admitting: Internal Medicine

## 2021-02-18 DIAGNOSIS — E785 Hyperlipidemia, unspecified: Secondary | ICD-10-CM | POA: Diagnosis not present

## 2021-02-18 DIAGNOSIS — F0150 Vascular dementia without behavioral disturbance: Secondary | ICD-10-CM | POA: Diagnosis not present

## 2021-02-18 DIAGNOSIS — I69818 Other symptoms and signs involving cognitive functions following other cerebrovascular disease: Secondary | ICD-10-CM | POA: Diagnosis not present

## 2021-02-18 DIAGNOSIS — R634 Abnormal weight loss: Secondary | ICD-10-CM | POA: Diagnosis not present

## 2021-02-18 DIAGNOSIS — C7989 Secondary malignant neoplasm of other specified sites: Secondary | ICD-10-CM | POA: Diagnosis not present

## 2021-02-18 DIAGNOSIS — C50912 Malignant neoplasm of unspecified site of left female breast: Secondary | ICD-10-CM | POA: Diagnosis not present

## 2021-02-20 ENCOUNTER — Encounter: Payer: Self-pay | Admitting: Internal Medicine

## 2021-02-20 DIAGNOSIS — I69818 Other symptoms and signs involving cognitive functions following other cerebrovascular disease: Secondary | ICD-10-CM | POA: Diagnosis not present

## 2021-02-20 DIAGNOSIS — F0150 Vascular dementia without behavioral disturbance: Secondary | ICD-10-CM | POA: Diagnosis not present

## 2021-02-20 DIAGNOSIS — C7989 Secondary malignant neoplasm of other specified sites: Secondary | ICD-10-CM | POA: Diagnosis not present

## 2021-02-20 DIAGNOSIS — E785 Hyperlipidemia, unspecified: Secondary | ICD-10-CM | POA: Diagnosis not present

## 2021-02-20 DIAGNOSIS — R634 Abnormal weight loss: Secondary | ICD-10-CM | POA: Diagnosis not present

## 2021-02-20 DIAGNOSIS — C50912 Malignant neoplasm of unspecified site of left female breast: Secondary | ICD-10-CM | POA: Diagnosis not present

## 2021-02-22 ENCOUNTER — Other Ambulatory Visit: Payer: Self-pay | Admitting: Internal Medicine

## 2021-02-22 DIAGNOSIS — C50912 Malignant neoplasm of unspecified site of left female breast: Secondary | ICD-10-CM | POA: Diagnosis not present

## 2021-02-22 DIAGNOSIS — F0150 Vascular dementia without behavioral disturbance: Secondary | ICD-10-CM | POA: Diagnosis not present

## 2021-02-22 DIAGNOSIS — C7989 Secondary malignant neoplasm of other specified sites: Secondary | ICD-10-CM | POA: Diagnosis not present

## 2021-02-22 DIAGNOSIS — R634 Abnormal weight loss: Secondary | ICD-10-CM | POA: Diagnosis not present

## 2021-02-22 DIAGNOSIS — I69818 Other symptoms and signs involving cognitive functions following other cerebrovascular disease: Secondary | ICD-10-CM | POA: Diagnosis not present

## 2021-02-22 DIAGNOSIS — E785 Hyperlipidemia, unspecified: Secondary | ICD-10-CM | POA: Diagnosis not present

## 2021-02-23 DIAGNOSIS — F0150 Vascular dementia without behavioral disturbance: Secondary | ICD-10-CM | POA: Diagnosis not present

## 2021-02-23 DIAGNOSIS — R634 Abnormal weight loss: Secondary | ICD-10-CM | POA: Diagnosis not present

## 2021-02-23 DIAGNOSIS — C7989 Secondary malignant neoplasm of other specified sites: Secondary | ICD-10-CM | POA: Diagnosis not present

## 2021-02-23 DIAGNOSIS — I69818 Other symptoms and signs involving cognitive functions following other cerebrovascular disease: Secondary | ICD-10-CM | POA: Diagnosis not present

## 2021-02-23 DIAGNOSIS — E785 Hyperlipidemia, unspecified: Secondary | ICD-10-CM | POA: Diagnosis not present

## 2021-02-23 DIAGNOSIS — C50912 Malignant neoplasm of unspecified site of left female breast: Secondary | ICD-10-CM | POA: Diagnosis not present

## 2021-02-24 DIAGNOSIS — C50912 Malignant neoplasm of unspecified site of left female breast: Secondary | ICD-10-CM | POA: Diagnosis not present

## 2021-02-24 DIAGNOSIS — I69818 Other symptoms and signs involving cognitive functions following other cerebrovascular disease: Secondary | ICD-10-CM | POA: Diagnosis not present

## 2021-02-24 DIAGNOSIS — R634 Abnormal weight loss: Secondary | ICD-10-CM | POA: Diagnosis not present

## 2021-02-24 DIAGNOSIS — E785 Hyperlipidemia, unspecified: Secondary | ICD-10-CM | POA: Diagnosis not present

## 2021-02-24 DIAGNOSIS — C7989 Secondary malignant neoplasm of other specified sites: Secondary | ICD-10-CM | POA: Diagnosis not present

## 2021-02-24 DIAGNOSIS — F0150 Vascular dementia without behavioral disturbance: Secondary | ICD-10-CM | POA: Diagnosis not present

## 2021-02-28 DIAGNOSIS — F0150 Vascular dementia without behavioral disturbance: Secondary | ICD-10-CM | POA: Diagnosis not present

## 2021-02-28 DIAGNOSIS — R634 Abnormal weight loss: Secondary | ICD-10-CM | POA: Diagnosis not present

## 2021-02-28 DIAGNOSIS — E785 Hyperlipidemia, unspecified: Secondary | ICD-10-CM | POA: Diagnosis not present

## 2021-02-28 DIAGNOSIS — M109 Gout, unspecified: Secondary | ICD-10-CM | POA: Diagnosis not present

## 2021-02-28 DIAGNOSIS — E039 Hypothyroidism, unspecified: Secondary | ICD-10-CM | POA: Diagnosis not present

## 2021-02-28 DIAGNOSIS — M810 Age-related osteoporosis without current pathological fracture: Secondary | ICD-10-CM | POA: Diagnosis not present

## 2021-02-28 DIAGNOSIS — C7989 Secondary malignant neoplasm of other specified sites: Secondary | ICD-10-CM | POA: Diagnosis not present

## 2021-02-28 DIAGNOSIS — G629 Polyneuropathy, unspecified: Secondary | ICD-10-CM | POA: Diagnosis not present

## 2021-02-28 DIAGNOSIS — I10 Essential (primary) hypertension: Secondary | ICD-10-CM | POA: Diagnosis not present

## 2021-02-28 DIAGNOSIS — C50912 Malignant neoplasm of unspecified site of left female breast: Secondary | ICD-10-CM | POA: Diagnosis not present

## 2021-02-28 DIAGNOSIS — I69818 Other symptoms and signs involving cognitive functions following other cerebrovascular disease: Secondary | ICD-10-CM | POA: Diagnosis not present

## 2021-03-01 DIAGNOSIS — F0150 Vascular dementia without behavioral disturbance: Secondary | ICD-10-CM | POA: Diagnosis not present

## 2021-03-01 DIAGNOSIS — E785 Hyperlipidemia, unspecified: Secondary | ICD-10-CM | POA: Diagnosis not present

## 2021-03-01 DIAGNOSIS — C50912 Malignant neoplasm of unspecified site of left female breast: Secondary | ICD-10-CM | POA: Diagnosis not present

## 2021-03-01 DIAGNOSIS — C7989 Secondary malignant neoplasm of other specified sites: Secondary | ICD-10-CM | POA: Diagnosis not present

## 2021-03-01 DIAGNOSIS — I69818 Other symptoms and signs involving cognitive functions following other cerebrovascular disease: Secondary | ICD-10-CM | POA: Diagnosis not present

## 2021-03-01 DIAGNOSIS — R634 Abnormal weight loss: Secondary | ICD-10-CM | POA: Diagnosis not present

## 2021-03-03 DIAGNOSIS — F0150 Vascular dementia without behavioral disturbance: Secondary | ICD-10-CM | POA: Diagnosis not present

## 2021-03-03 DIAGNOSIS — E785 Hyperlipidemia, unspecified: Secondary | ICD-10-CM | POA: Diagnosis not present

## 2021-03-03 DIAGNOSIS — C7989 Secondary malignant neoplasm of other specified sites: Secondary | ICD-10-CM | POA: Diagnosis not present

## 2021-03-03 DIAGNOSIS — R634 Abnormal weight loss: Secondary | ICD-10-CM | POA: Diagnosis not present

## 2021-03-03 DIAGNOSIS — C50912 Malignant neoplasm of unspecified site of left female breast: Secondary | ICD-10-CM | POA: Diagnosis not present

## 2021-03-03 DIAGNOSIS — I69818 Other symptoms and signs involving cognitive functions following other cerebrovascular disease: Secondary | ICD-10-CM | POA: Diagnosis not present

## 2021-03-06 DIAGNOSIS — E785 Hyperlipidemia, unspecified: Secondary | ICD-10-CM | POA: Diagnosis not present

## 2021-03-06 DIAGNOSIS — C7989 Secondary malignant neoplasm of other specified sites: Secondary | ICD-10-CM | POA: Diagnosis not present

## 2021-03-06 DIAGNOSIS — I69818 Other symptoms and signs involving cognitive functions following other cerebrovascular disease: Secondary | ICD-10-CM | POA: Diagnosis not present

## 2021-03-06 DIAGNOSIS — R634 Abnormal weight loss: Secondary | ICD-10-CM | POA: Diagnosis not present

## 2021-03-06 DIAGNOSIS — F0150 Vascular dementia without behavioral disturbance: Secondary | ICD-10-CM | POA: Diagnosis not present

## 2021-03-06 DIAGNOSIS — C50912 Malignant neoplasm of unspecified site of left female breast: Secondary | ICD-10-CM | POA: Diagnosis not present

## 2021-03-07 DIAGNOSIS — C7989 Secondary malignant neoplasm of other specified sites: Secondary | ICD-10-CM | POA: Diagnosis not present

## 2021-03-07 DIAGNOSIS — F0150 Vascular dementia without behavioral disturbance: Secondary | ICD-10-CM | POA: Diagnosis not present

## 2021-03-07 DIAGNOSIS — I69818 Other symptoms and signs involving cognitive functions following other cerebrovascular disease: Secondary | ICD-10-CM | POA: Diagnosis not present

## 2021-03-07 DIAGNOSIS — R634 Abnormal weight loss: Secondary | ICD-10-CM | POA: Diagnosis not present

## 2021-03-07 DIAGNOSIS — C50912 Malignant neoplasm of unspecified site of left female breast: Secondary | ICD-10-CM | POA: Diagnosis not present

## 2021-03-07 DIAGNOSIS — E785 Hyperlipidemia, unspecified: Secondary | ICD-10-CM | POA: Diagnosis not present

## 2021-03-08 ENCOUNTER — Inpatient Hospital Stay: Payer: Medicare Other | Attending: Oncology | Admitting: Oncology

## 2021-03-08 DIAGNOSIS — C50812 Malignant neoplasm of overlapping sites of left female breast: Secondary | ICD-10-CM | POA: Diagnosis not present

## 2021-03-08 DIAGNOSIS — F0150 Vascular dementia without behavioral disturbance: Secondary | ICD-10-CM | POA: Diagnosis not present

## 2021-03-08 DIAGNOSIS — R634 Abnormal weight loss: Secondary | ICD-10-CM | POA: Diagnosis not present

## 2021-03-08 DIAGNOSIS — C50912 Malignant neoplasm of unspecified site of left female breast: Secondary | ICD-10-CM | POA: Diagnosis not present

## 2021-03-08 DIAGNOSIS — C7989 Secondary malignant neoplasm of other specified sites: Secondary | ICD-10-CM | POA: Diagnosis not present

## 2021-03-08 DIAGNOSIS — E785 Hyperlipidemia, unspecified: Secondary | ICD-10-CM | POA: Diagnosis not present

## 2021-03-08 DIAGNOSIS — Z170 Estrogen receptor positive status [ER+]: Secondary | ICD-10-CM

## 2021-03-08 DIAGNOSIS — I69818 Other symptoms and signs involving cognitive functions following other cerebrovascular disease: Secondary | ICD-10-CM | POA: Diagnosis not present

## 2021-03-08 NOTE — Progress Notes (Signed)
[facility]  [phone] [phone]    ID: Shelia Marks DOB: 1926-01-20  MR#: [id number]  DGU#:[id number]  Patient Care Team: Shelia Curry, DO as PCP - General (Geriatric Medicine) Shelia Guys, MD as Consulting Physician (Ophthalmology) Magrinat, Virgie Dad, MD as Consulting Physician (Oncology) Shelia Kussmaul, MD as Consulting Physician (General Surgery) Shelia Pi, MD as Consulting Physician (Endocrinology) OTHER MD:  I connected with Shelia Marks on 03/08/21 at  2:00 PM EST by telephone visit and verified that I am speaking with the correct person using two identifiers.   I discussed the limitations, risks, security and privacy concerns of performing an evaluation and management service by telemedicine and the availability of in-person appointments. I also discussed with the patient that there may be a patient responsible charge related to this service. The patient expressed understanding and agreed to proceed.   Other persons participating in the visit and their role in the encounter: sister Shelia Marks (patient's HCPOA)  Patient's location: Home Provider's location: [place]  Total time spent: 15 min   CHIEF COMPLAINT: Estrogen receptor positive breast cancer (s/p left mastectomy)  CURRENT TREATMENT: Fulvestrant;   INTERVAL HISTORY: Shelia Marks was contacted today for follow-up and treatment of her estrogen receptor positive breast cancer.  I spoke briefly with her and more extensively with her Sister Shelia Marks had been receiving fulvestrant every 4 weeks.  However quite aside from the discomfort of actually receiving the medication just getting here had become on a normal store for the patient and her sister.  Accordingly backed off on treatment.  In the meantime she was referred to hospice and they are now very busily supporting the patient and her sister.  They have an aide that comes by as 3 times a week and  helps give Shelia Marks a bath.  The nurse comes once a week and looks things over.  REVIEW OF SYSTEMS: Overall Shelia Marks tells me Shelia Marks is comfortable.  Starting a little dose of prednisone as we did last time has increased her appetite and she is not as sleepy as before.  She spends most of the day watching TV and "talking about dead people" people a detailed review of systems today was otherwise noncontributory and specifically there have been no falls   COVID 19 VACCINATION STATUS: Status post [place] x2, most recently February 2021   BREAST CANCER HISTORY: From the original intake note:  "Shelia Marks" had bilateral screening mammography at the [facility] 06/07/2016 showing calcifications in the left breast. She was recalled  05/15/2016 for left diagnostic mammography. This showed the breast density to be category be. In the upper outer left breast there was an area of suspicious calcifications measuring 1.7 cm.  Biopsy of this area was obtained 05/21/2016, and showed ([id number]) ductal carcinoma in situ, high-grade, estrogen receptor 100% positive, progesterone receptor 5% positive, WITH strong staining intensity.  Her subsequent history is as detailed below.   PAST MEDICAL HISTORY: Past Medical History:  Diagnosis Date  . Alzheimer's disease ([place])   . Cancer ([place])   . Disorder of bone and cartilage, unspecified   . Dizziness and giddiness   . Hyperlipidemia LDL goal < 100   . Hypertension   . Hypothyroidism   . Malignant neoplasm of thyroid gland ([place])   . Nontoxic uninodular goiter   . Other malaise and fatigue   . Other symptoms involving cardiovascular system   . Phlebitis and thrombophlebitis of unspecified site   . Senile osteoporosis   .  Sleep related leg cramps   . Stroke ([place])   . Unspecified disorder of lipoid metabolism   . Unspecified hereditary and idiopathic peripheral neuropathy   . Varicose veins of lower extremities with inflammation     PAST SURGICAL  HISTORY: Past Surgical History:  Procedure Laterality Date  . ABDOMINAL HYSTERECTOMY     Shelia Marks  . BREAST BIOPSY Left 05/21/2016   malignant  . CATRACT SURGERY  2008  . COLON SURGERY     clipped polyp during colonoscopy  . EXCISION OF KELOID Left 02/19/2020   Procedure: EXCISION NODULE LEFT CHEST WALL;  Surgeon: Shelia Kussmaul, MD;  Location: [facility];  Service: General;  Laterality: Left;  . KNEE SURGERY     right  . MASTECTOMY MODIFIED RADICAL Left 09/10/2019   Procedure: LEFT MASTECTOMY MODIFIED RADICAL;  Surgeon: Shelia Kussmaul, MD;  Location: [facility];  Service: General;  Laterality: Left;  . PARATHYROID ADENOMA REMOVED  1983  . THYROID SURGERY  July 20, 2011    FAMILY HISTORY Family History  Problem Relation Age of Onset  . Diabetes Brother   The patient's father died from liver problems in his 5s. The patient's mother died in her 54s from "natural causes". The patient had no brothers. She had 6 sisters, 2 of whom died from complications of diabetes and one from a ruptured aneurysm. There is no history of breast or ovarian cancer in the family.    GYNECOLOGIC HISTORY:  No LMP recorded. Patient has had a hysterectomy.  Shelia Marks does not remember how old she was when she had her first period. She never carried a child to term. She is status post hysterectomy and at least one ovary was removed. She never took hormone replacement.    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  She is widowed and lives with her sister.   ADVANCED DIRECTIVES: [name], Shelia Marks, is her healthcare power of attorney. Shelia Marks can be reached at [phone].   HEALTH MAINTENANCE: Social History   Tobacco Use  . Smoking status: Never Smoker  . Smokeless tobacco: Never Used  Vaping Use  . Vaping Use: Never used  Substance Use Topics  . Alcohol use: No  . Drug use: No     No Known Allergies  Current Outpatient  Medications  Medication Sig Dispense Refill  . acetaminophen (TYLENOL) 325 MG tablet Take 2 tablets (650 mg total) by mouth every 6 (six) hours as needed for mild pain, moderate pain, fever or headache.    . alendronate (FOSAMAX) 70 MG tablet TAKE 1 TABLET (70 MG TOTAL) EVERY 7 (SEVEN) DAYS. TAKE WITH A FULL GLASS OF WATER ON AN EMPTY STOMACH. 12 tablet 1  . allopurinol (ZYLOPRIM) 300 MG tablet TAKE 1 TABLET EVERY DAY 90 tablet 1  . amLODipine (NORVASC) 10 MG tablet TAKE 1 TABLET EVERY DAY 90 tablet 1  . aspirin EC 81 MG tablet Take 1 tablet (81 mg total) by mouth daily. 30 tablet 3  . atenolol (TENORMIN) 25 MG tablet TAKE 1 TABLET EVERY DAY 90 tablet 1  . atorvastatin (LIPITOR) 40 MG tablet TAKE 1 TABLET EVERY DAY 30 tablet 0  . fulvestrant (FASLODEX) 250 MG/5ML injection Inject 500 mg into the muscle once. One injection each buttock over 1-2 minutes. Warm prior to use.    . levothyroxine (SYNTHROID) 112 MCG tablet TAKE 1 TABLET BY MOUTH EVERY DAY 90 tablet 3  .  lisinopril (ZESTRIL) 2.5 MG tablet TAKE 1 TABLET DAILY TO CONTROL BLOOD PRESSURE 90 tablet 1  . magnesium hydroxide (MILK OF MAGNESIA) 400 MG/5ML suspension Take 5-10 mLs by mouth daily as needed for mild constipation.    . predniSONE (DELTASONE) 5 MG tablet Take 1 tablet (5 mg total) by mouth daily with breakfast. 40 tablet 3  . sertraline (ZOLOFT) 50 MG tablet TAKE ONE TABLET DAILY FOR DEPRESSION 90 tablet 1   No current facility-administered medications for this visit.    OBJECTIVE:  There were no vitals filed for this visit.   There is no height or weight on file to calculate BMI.    ECOG FS:2 - Symptomatic, <50% confined to bed  Telemedicine visit 03/08/2021  LAB RESULTS:  CMP     Component Value Date/Time   NA 142 02/08/2021 1116   NA 143 06/01/2016 0958   K 3.9 02/08/2021 1116   CL 109 02/08/2021 1116   CO2 27 02/08/2021 1116   GLUCOSE 96 02/08/2021 1116   BUN 18 02/08/2021 1116   BUN 21 06/01/2016 0958   CREATININE  1.04 (H) 02/08/2021 1116   CREATININE 1.13 (H) 10/03/2020 1541   CALCIUM 9.9 02/08/2021 1116   PROT 7.4 02/08/2021 1116   PROT 6.8 11/09/2015 1013   ALBUMIN 3.7 02/08/2021 1116   ALBUMIN 4.1 11/09/2015 1013   AST 53 (H) 02/08/2021 1116   ALT 24 02/08/2021 1116   ALKPHOS 65 02/08/2021 1116   BILITOT 0.6 02/08/2021 1116   GFRNONAA 50 (L) 02/08/2021 1116   GFRNONAA 42 (L) 10/03/2020 1541   GFRAA 49 (L) 10/03/2020 1541    INo results found for: SPEP, UPEP  Lab Results  Component Value Date   WBC 3.4 (L) 02/08/2021   NEUTROABS 2.3 02/08/2021   HGB 11.7 (L) 02/08/2021   HCT 37.1 02/08/2021   MCV 94.4 02/08/2021   PLT 196 02/08/2021      Chemistry      Component Value Date/Time   NA 142 02/08/2021 1116   NA 143 06/01/2016 0958   K 3.9 02/08/2021 1116   CL 109 02/08/2021 1116   CO2 27 02/08/2021 1116   BUN 18 02/08/2021 1116   BUN 21 06/01/2016 0958   CREATININE 1.04 (H) 02/08/2021 1116   CREATININE 1.13 (H) 10/03/2020 1541      Component Value Date/Time   CALCIUM 9.9 02/08/2021 1116   ALKPHOS 65 02/08/2021 1116   AST 53 (H) 02/08/2021 1116   ALT 24 02/08/2021 1116   BILITOT 0.6 02/08/2021 1116       No results found for: LABCA2  No components found for: LABCA125  No results for input(s): INR in the last 168 hours.  Urinalysis    Component Value Date/Time   COLORURINE YELLOW 05/23/2020 1454   APPEARANCEUR CLEAR 05/23/2020 1454   LABSPEC 1.016 05/23/2020 1454   PHURINE 6.0 05/23/2020 1454   GLUCOSEU NEGATIVE 05/23/2020 1454   [place] 05/23/2020 [place] 05/23/2020 [place] 05/23/2020 1454   PROTEINUR NEGATIVE 05/23/2020 1454   UROBILINOGEN 0.2 03/05/2014 2055   NITRITE NEGATIVE 05/23/2020 1454   LEUKOCYTESUR SMALL (A) 05/23/2020 1454    STUDIES: No results found.   ELIGIBLE FOR AVAILABLE RESEARCH PROTOCOL: no   ASSESSMENT: 87 y.o. [place] woman status post left breast upper outer quadrant biopsy  05/21/2016 for ductal carcinoma in situ, high-grade, estrogen and progesterone receptor positive  (1) opted against surgery   (2) started anastrozole neoadjuvantly 06/13/2016, discontinued June  2018 due to osteoporosis concerns  (a)  bone density 11/16/2016 shows a T score of -4.7 (osteoporosis)-on fosamax.   (3) mammography/ultrasonography 06/10/2018 shows evidence of disease progression, possible lymph node extension  (4) letrozole started 06/27/2018   (a) discontinued November 2019 with evidence of progression  (b) restarted 09/2019, to change to Exemestane in 12/2019  (5) fulvestrant started 11/21/2018, discontinued 03/12/2019  (a) mammography and ultrasonography 07/24/2019 shows evidence of disease progression  (6) Left modified radical mastectomy on 09/10/2019: showing a stage IIIA, T2, N2a, invasive ductal carcinoma, grade 3, margins negative, estrogen receptor positive, progesterone receptor and HER-2 negative, with an MIB-1 of 90%  (a) 4 out of 14 lymph nodes positive for macrometastases  (b) 1 out of 14 lymph nodes positive for micrometastases  (c) CT chest on 08/26/2019 negative for metastatic disease  (7) Adjuvant radiation recommended on 09/24/2019 and again on10/09/2019, declined by patient  (8) removal of a left chest wall lesion 02/19/2020 showing metastatic breast cancer, estrogen receptor 95% and strongly positive, progesterone receptor negative, HER-2 negative with an MIB-1 of 50%  (a) patient and sister again opted against adjuvant radiation at 03/07/2020 visit  (b) additional lesions noted clincially 07/07/2020  (c) adjuvant radiation to left chest wall from 08/09/2020 through 08/22/2020  (9) exemestane started December 2020 through 07/2020, discontinued due to progression  (b) began fulvestrant on 08/17/2020   PLAN: Clarivel is now under hospice care.  To the best of my ability to tell after speaking with her and her sister the patient is very comfortable and has the  same functional status as before.  Of course dementia is the overriding problem but also there is the extremes of age and all that goes along with that.  Hospice care at this point is appropriate  I discussed with Shelia Marks that the only thing that really matters is to make sure Kaylon is comfortable and so long as she is then Shelia Marks is doing all that needs to be done.  If Margreta Journey is not comfortable then she needs to tell the hospice nurse and they can initiate any additional measures that may be needed.  At this point we are not making any further visits here for [facility] but of course we will be glad to see her at any point in the future if her sister or the hospice team thinks that would be advisable     Sarajane Jews C. Magrinat, MD 03/08/21 9:08 AM Medical Oncology and Hematology [facility] [place], [place] [address] Tel. [phone]    Fax. [phone]   I, Shelia Marks, am acting as scribe for Shelia. Virgie Marks. Magrinat.  I, Shelia Del MD, have reviewed the above documentation for accuracy and completeness, and I agree with the above.   *Total Encounter Time as defined by the Centers for Medicare and Medicaid Services includes, in addition to the face-to-face time of a patient visit (documented in the note above) non-face-to-face time: obtaining and reviewing outside history, ordering and reviewing medications, tests or procedures, care coordination (communications with other health care professionals or caregivers) and documentation in the medical record.

## 2021-03-09 DIAGNOSIS — C50912 Malignant neoplasm of unspecified site of left female breast: Secondary | ICD-10-CM | POA: Diagnosis not present

## 2021-03-09 DIAGNOSIS — F0150 Vascular dementia without behavioral disturbance: Secondary | ICD-10-CM | POA: Diagnosis not present

## 2021-03-09 DIAGNOSIS — E785 Hyperlipidemia, unspecified: Secondary | ICD-10-CM | POA: Diagnosis not present

## 2021-03-09 DIAGNOSIS — I69818 Other symptoms and signs involving cognitive functions following other cerebrovascular disease: Secondary | ICD-10-CM | POA: Diagnosis not present

## 2021-03-09 DIAGNOSIS — C7989 Secondary malignant neoplasm of other specified sites: Secondary | ICD-10-CM | POA: Diagnosis not present

## 2021-03-09 DIAGNOSIS — R634 Abnormal weight loss: Secondary | ICD-10-CM | POA: Diagnosis not present

## 2021-03-10 DIAGNOSIS — C50912 Malignant neoplasm of unspecified site of left female breast: Secondary | ICD-10-CM | POA: Diagnosis not present

## 2021-03-10 DIAGNOSIS — F0150 Vascular dementia without behavioral disturbance: Secondary | ICD-10-CM | POA: Diagnosis not present

## 2021-03-10 DIAGNOSIS — I69818 Other symptoms and signs involving cognitive functions following other cerebrovascular disease: Secondary | ICD-10-CM | POA: Diagnosis not present

## 2021-03-10 DIAGNOSIS — C7989 Secondary malignant neoplasm of other specified sites: Secondary | ICD-10-CM | POA: Diagnosis not present

## 2021-03-10 DIAGNOSIS — E785 Hyperlipidemia, unspecified: Secondary | ICD-10-CM | POA: Diagnosis not present

## 2021-03-10 DIAGNOSIS — R634 Abnormal weight loss: Secondary | ICD-10-CM | POA: Diagnosis not present

## 2021-03-14 DIAGNOSIS — E785 Hyperlipidemia, unspecified: Secondary | ICD-10-CM | POA: Diagnosis not present

## 2021-03-14 DIAGNOSIS — I69818 Other symptoms and signs involving cognitive functions following other cerebrovascular disease: Secondary | ICD-10-CM | POA: Diagnosis not present

## 2021-03-14 DIAGNOSIS — C7989 Secondary malignant neoplasm of other specified sites: Secondary | ICD-10-CM | POA: Diagnosis not present

## 2021-03-14 DIAGNOSIS — R634 Abnormal weight loss: Secondary | ICD-10-CM | POA: Diagnosis not present

## 2021-03-14 DIAGNOSIS — F0150 Vascular dementia without behavioral disturbance: Secondary | ICD-10-CM | POA: Diagnosis not present

## 2021-03-14 DIAGNOSIS — C50912 Malignant neoplasm of unspecified site of left female breast: Secondary | ICD-10-CM | POA: Diagnosis not present

## 2021-03-15 DIAGNOSIS — C7989 Secondary malignant neoplasm of other specified sites: Secondary | ICD-10-CM | POA: Diagnosis not present

## 2021-03-15 DIAGNOSIS — R634 Abnormal weight loss: Secondary | ICD-10-CM | POA: Diagnosis not present

## 2021-03-15 DIAGNOSIS — F0150 Vascular dementia without behavioral disturbance: Secondary | ICD-10-CM | POA: Diagnosis not present

## 2021-03-15 DIAGNOSIS — E785 Hyperlipidemia, unspecified: Secondary | ICD-10-CM | POA: Diagnosis not present

## 2021-03-15 DIAGNOSIS — C50912 Malignant neoplasm of unspecified site of left female breast: Secondary | ICD-10-CM | POA: Diagnosis not present

## 2021-03-15 DIAGNOSIS — I69818 Other symptoms and signs involving cognitive functions following other cerebrovascular disease: Secondary | ICD-10-CM | POA: Diagnosis not present

## 2021-03-17 ENCOUNTER — Other Ambulatory Visit: Payer: Self-pay | Admitting: Internal Medicine

## 2021-03-17 DIAGNOSIS — F0150 Vascular dementia without behavioral disturbance: Secondary | ICD-10-CM | POA: Diagnosis not present

## 2021-03-17 DIAGNOSIS — E785 Hyperlipidemia, unspecified: Secondary | ICD-10-CM | POA: Diagnosis not present

## 2021-03-17 DIAGNOSIS — I69818 Other symptoms and signs involving cognitive functions following other cerebrovascular disease: Secondary | ICD-10-CM | POA: Diagnosis not present

## 2021-03-17 DIAGNOSIS — R634 Abnormal weight loss: Secondary | ICD-10-CM | POA: Diagnosis not present

## 2021-03-17 DIAGNOSIS — C7989 Secondary malignant neoplasm of other specified sites: Secondary | ICD-10-CM | POA: Diagnosis not present

## 2021-03-17 DIAGNOSIS — C50912 Malignant neoplasm of unspecified site of left female breast: Secondary | ICD-10-CM | POA: Diagnosis not present

## 2021-03-21 DIAGNOSIS — I69818 Other symptoms and signs involving cognitive functions following other cerebrovascular disease: Secondary | ICD-10-CM | POA: Diagnosis not present

## 2021-03-21 DIAGNOSIS — F0150 Vascular dementia without behavioral disturbance: Secondary | ICD-10-CM | POA: Diagnosis not present

## 2021-03-21 DIAGNOSIS — E785 Hyperlipidemia, unspecified: Secondary | ICD-10-CM | POA: Diagnosis not present

## 2021-03-21 DIAGNOSIS — C50912 Malignant neoplasm of unspecified site of left female breast: Secondary | ICD-10-CM | POA: Diagnosis not present

## 2021-03-21 DIAGNOSIS — R634 Abnormal weight loss: Secondary | ICD-10-CM | POA: Diagnosis not present

## 2021-03-21 DIAGNOSIS — C7989 Secondary malignant neoplasm of other specified sites: Secondary | ICD-10-CM | POA: Diagnosis not present

## 2021-03-22 ENCOUNTER — Other Ambulatory Visit: Payer: Self-pay | Admitting: Internal Medicine

## 2021-03-22 DIAGNOSIS — C50912 Malignant neoplasm of unspecified site of left female breast: Secondary | ICD-10-CM | POA: Diagnosis not present

## 2021-03-22 DIAGNOSIS — I69818 Other symptoms and signs involving cognitive functions following other cerebrovascular disease: Secondary | ICD-10-CM | POA: Diagnosis not present

## 2021-03-22 DIAGNOSIS — E785 Hyperlipidemia, unspecified: Secondary | ICD-10-CM | POA: Diagnosis not present

## 2021-03-22 DIAGNOSIS — R634 Abnormal weight loss: Secondary | ICD-10-CM | POA: Diagnosis not present

## 2021-03-22 DIAGNOSIS — F0150 Vascular dementia without behavioral disturbance: Secondary | ICD-10-CM | POA: Diagnosis not present

## 2021-03-22 DIAGNOSIS — C7989 Secondary malignant neoplasm of other specified sites: Secondary | ICD-10-CM | POA: Diagnosis not present

## 2021-03-24 DIAGNOSIS — F0150 Vascular dementia without behavioral disturbance: Secondary | ICD-10-CM | POA: Diagnosis not present

## 2021-03-24 DIAGNOSIS — C7989 Secondary malignant neoplasm of other specified sites: Secondary | ICD-10-CM | POA: Diagnosis not present

## 2021-03-24 DIAGNOSIS — R634 Abnormal weight loss: Secondary | ICD-10-CM | POA: Diagnosis not present

## 2021-03-24 DIAGNOSIS — I69818 Other symptoms and signs involving cognitive functions following other cerebrovascular disease: Secondary | ICD-10-CM | POA: Diagnosis not present

## 2021-03-24 DIAGNOSIS — C50912 Malignant neoplasm of unspecified site of left female breast: Secondary | ICD-10-CM | POA: Diagnosis not present

## 2021-03-24 DIAGNOSIS — E785 Hyperlipidemia, unspecified: Secondary | ICD-10-CM | POA: Diagnosis not present

## 2021-03-28 DIAGNOSIS — C50912 Malignant neoplasm of unspecified site of left female breast: Secondary | ICD-10-CM | POA: Diagnosis not present

## 2021-03-28 DIAGNOSIS — I69818 Other symptoms and signs involving cognitive functions following other cerebrovascular disease: Secondary | ICD-10-CM | POA: Diagnosis not present

## 2021-03-28 DIAGNOSIS — C7989 Secondary malignant neoplasm of other specified sites: Secondary | ICD-10-CM | POA: Diagnosis not present

## 2021-03-28 DIAGNOSIS — F0150 Vascular dementia without behavioral disturbance: Secondary | ICD-10-CM | POA: Diagnosis not present

## 2021-03-28 DIAGNOSIS — E785 Hyperlipidemia, unspecified: Secondary | ICD-10-CM | POA: Diagnosis not present

## 2021-03-28 DIAGNOSIS — R634 Abnormal weight loss: Secondary | ICD-10-CM | POA: Diagnosis not present

## 2021-03-29 DIAGNOSIS — E785 Hyperlipidemia, unspecified: Secondary | ICD-10-CM | POA: Diagnosis not present

## 2021-03-29 DIAGNOSIS — C50912 Malignant neoplasm of unspecified site of left female breast: Secondary | ICD-10-CM | POA: Diagnosis not present

## 2021-03-29 DIAGNOSIS — R634 Abnormal weight loss: Secondary | ICD-10-CM | POA: Diagnosis not present

## 2021-03-29 DIAGNOSIS — C7989 Secondary malignant neoplasm of other specified sites: Secondary | ICD-10-CM | POA: Diagnosis not present

## 2021-03-29 DIAGNOSIS — F0150 Vascular dementia without behavioral disturbance: Secondary | ICD-10-CM | POA: Diagnosis not present

## 2021-03-29 DIAGNOSIS — I69818 Other symptoms and signs involving cognitive functions following other cerebrovascular disease: Secondary | ICD-10-CM | POA: Diagnosis not present

## 2021-03-31 DIAGNOSIS — C50912 Malignant neoplasm of unspecified site of left female breast: Secondary | ICD-10-CM | POA: Diagnosis not present

## 2021-03-31 DIAGNOSIS — R634 Abnormal weight loss: Secondary | ICD-10-CM | POA: Diagnosis not present

## 2021-03-31 DIAGNOSIS — M810 Age-related osteoporosis without current pathological fracture: Secondary | ICD-10-CM | POA: Diagnosis not present

## 2021-03-31 DIAGNOSIS — M109 Gout, unspecified: Secondary | ICD-10-CM | POA: Diagnosis not present

## 2021-03-31 DIAGNOSIS — I69818 Other symptoms and signs involving cognitive functions following other cerebrovascular disease: Secondary | ICD-10-CM | POA: Diagnosis not present

## 2021-03-31 DIAGNOSIS — I10 Essential (primary) hypertension: Secondary | ICD-10-CM | POA: Diagnosis not present

## 2021-03-31 DIAGNOSIS — C7989 Secondary malignant neoplasm of other specified sites: Secondary | ICD-10-CM | POA: Diagnosis not present

## 2021-03-31 DIAGNOSIS — F0150 Vascular dementia without behavioral disturbance: Secondary | ICD-10-CM | POA: Diagnosis not present

## 2021-03-31 DIAGNOSIS — E785 Hyperlipidemia, unspecified: Secondary | ICD-10-CM | POA: Diagnosis not present

## 2021-03-31 DIAGNOSIS — E039 Hypothyroidism, unspecified: Secondary | ICD-10-CM | POA: Diagnosis not present

## 2021-03-31 DIAGNOSIS — G629 Polyneuropathy, unspecified: Secondary | ICD-10-CM | POA: Diagnosis not present

## 2021-04-04 DIAGNOSIS — F0150 Vascular dementia without behavioral disturbance: Secondary | ICD-10-CM | POA: Diagnosis not present

## 2021-04-04 DIAGNOSIS — I69818 Other symptoms and signs involving cognitive functions following other cerebrovascular disease: Secondary | ICD-10-CM | POA: Diagnosis not present

## 2021-04-04 DIAGNOSIS — C7989 Secondary malignant neoplasm of other specified sites: Secondary | ICD-10-CM | POA: Diagnosis not present

## 2021-04-04 DIAGNOSIS — E785 Hyperlipidemia, unspecified: Secondary | ICD-10-CM | POA: Diagnosis not present

## 2021-04-04 DIAGNOSIS — R634 Abnormal weight loss: Secondary | ICD-10-CM | POA: Diagnosis not present

## 2021-04-04 DIAGNOSIS — C50912 Malignant neoplasm of unspecified site of left female breast: Secondary | ICD-10-CM | POA: Diagnosis not present

## 2021-04-05 DIAGNOSIS — F0150 Vascular dementia without behavioral disturbance: Secondary | ICD-10-CM | POA: Diagnosis not present

## 2021-04-05 DIAGNOSIS — C7989 Secondary malignant neoplasm of other specified sites: Secondary | ICD-10-CM | POA: Diagnosis not present

## 2021-04-05 DIAGNOSIS — E785 Hyperlipidemia, unspecified: Secondary | ICD-10-CM | POA: Diagnosis not present

## 2021-04-05 DIAGNOSIS — C50912 Malignant neoplasm of unspecified site of left female breast: Secondary | ICD-10-CM | POA: Diagnosis not present

## 2021-04-05 DIAGNOSIS — R634 Abnormal weight loss: Secondary | ICD-10-CM | POA: Diagnosis not present

## 2021-04-05 DIAGNOSIS — I69818 Other symptoms and signs involving cognitive functions following other cerebrovascular disease: Secondary | ICD-10-CM | POA: Diagnosis not present

## 2021-04-07 DIAGNOSIS — C50912 Malignant neoplasm of unspecified site of left female breast: Secondary | ICD-10-CM | POA: Diagnosis not present

## 2021-04-07 DIAGNOSIS — I69818 Other symptoms and signs involving cognitive functions following other cerebrovascular disease: Secondary | ICD-10-CM | POA: Diagnosis not present

## 2021-04-07 DIAGNOSIS — R634 Abnormal weight loss: Secondary | ICD-10-CM | POA: Diagnosis not present

## 2021-04-07 DIAGNOSIS — C7989 Secondary malignant neoplasm of other specified sites: Secondary | ICD-10-CM | POA: Diagnosis not present

## 2021-04-07 DIAGNOSIS — E785 Hyperlipidemia, unspecified: Secondary | ICD-10-CM | POA: Diagnosis not present

## 2021-04-07 DIAGNOSIS — F0150 Vascular dementia without behavioral disturbance: Secondary | ICD-10-CM | POA: Diagnosis not present

## 2021-04-11 DIAGNOSIS — F0150 Vascular dementia without behavioral disturbance: Secondary | ICD-10-CM | POA: Diagnosis not present

## 2021-04-11 DIAGNOSIS — C7989 Secondary malignant neoplasm of other specified sites: Secondary | ICD-10-CM | POA: Diagnosis not present

## 2021-04-11 DIAGNOSIS — C50912 Malignant neoplasm of unspecified site of left female breast: Secondary | ICD-10-CM | POA: Diagnosis not present

## 2021-04-11 DIAGNOSIS — I69818 Other symptoms and signs involving cognitive functions following other cerebrovascular disease: Secondary | ICD-10-CM | POA: Diagnosis not present

## 2021-04-11 DIAGNOSIS — E785 Hyperlipidemia, unspecified: Secondary | ICD-10-CM | POA: Diagnosis not present

## 2021-04-11 DIAGNOSIS — R634 Abnormal weight loss: Secondary | ICD-10-CM | POA: Diagnosis not present

## 2021-04-12 DIAGNOSIS — C50912 Malignant neoplasm of unspecified site of left female breast: Secondary | ICD-10-CM | POA: Diagnosis not present

## 2021-04-12 DIAGNOSIS — I69818 Other symptoms and signs involving cognitive functions following other cerebrovascular disease: Secondary | ICD-10-CM | POA: Diagnosis not present

## 2021-04-12 DIAGNOSIS — R634 Abnormal weight loss: Secondary | ICD-10-CM | POA: Diagnosis not present

## 2021-04-12 DIAGNOSIS — F0150 Vascular dementia without behavioral disturbance: Secondary | ICD-10-CM | POA: Diagnosis not present

## 2021-04-12 DIAGNOSIS — C7989 Secondary malignant neoplasm of other specified sites: Secondary | ICD-10-CM | POA: Diagnosis not present

## 2021-04-12 DIAGNOSIS — E785 Hyperlipidemia, unspecified: Secondary | ICD-10-CM | POA: Diagnosis not present

## 2021-04-14 DIAGNOSIS — F0150 Vascular dementia without behavioral disturbance: Secondary | ICD-10-CM | POA: Diagnosis not present

## 2021-04-14 DIAGNOSIS — C50912 Malignant neoplasm of unspecified site of left female breast: Secondary | ICD-10-CM | POA: Diagnosis not present

## 2021-04-14 DIAGNOSIS — I69818 Other symptoms and signs involving cognitive functions following other cerebrovascular disease: Secondary | ICD-10-CM | POA: Diagnosis not present

## 2021-04-14 DIAGNOSIS — R634 Abnormal weight loss: Secondary | ICD-10-CM | POA: Diagnosis not present

## 2021-04-14 DIAGNOSIS — E785 Hyperlipidemia, unspecified: Secondary | ICD-10-CM | POA: Diagnosis not present

## 2021-04-14 DIAGNOSIS — C7989 Secondary malignant neoplasm of other specified sites: Secondary | ICD-10-CM | POA: Diagnosis not present

## 2021-04-18 DIAGNOSIS — R634 Abnormal weight loss: Secondary | ICD-10-CM | POA: Diagnosis not present

## 2021-04-18 DIAGNOSIS — I69818 Other symptoms and signs involving cognitive functions following other cerebrovascular disease: Secondary | ICD-10-CM | POA: Diagnosis not present

## 2021-04-18 DIAGNOSIS — C50912 Malignant neoplasm of unspecified site of left female breast: Secondary | ICD-10-CM | POA: Diagnosis not present

## 2021-04-18 DIAGNOSIS — E785 Hyperlipidemia, unspecified: Secondary | ICD-10-CM | POA: Diagnosis not present

## 2021-04-18 DIAGNOSIS — C7989 Secondary malignant neoplasm of other specified sites: Secondary | ICD-10-CM | POA: Diagnosis not present

## 2021-04-18 DIAGNOSIS — F0150 Vascular dementia without behavioral disturbance: Secondary | ICD-10-CM | POA: Diagnosis not present

## 2021-04-19 DIAGNOSIS — I69818 Other symptoms and signs involving cognitive functions following other cerebrovascular disease: Secondary | ICD-10-CM | POA: Diagnosis not present

## 2021-04-19 DIAGNOSIS — C7989 Secondary malignant neoplasm of other specified sites: Secondary | ICD-10-CM | POA: Diagnosis not present

## 2021-04-19 DIAGNOSIS — C50912 Malignant neoplasm of unspecified site of left female breast: Secondary | ICD-10-CM | POA: Diagnosis not present

## 2021-04-19 DIAGNOSIS — R634 Abnormal weight loss: Secondary | ICD-10-CM | POA: Diagnosis not present

## 2021-04-19 DIAGNOSIS — F0150 Vascular dementia without behavioral disturbance: Secondary | ICD-10-CM | POA: Diagnosis not present

## 2021-04-19 DIAGNOSIS — E785 Hyperlipidemia, unspecified: Secondary | ICD-10-CM | POA: Diagnosis not present

## 2021-04-21 DIAGNOSIS — E785 Hyperlipidemia, unspecified: Secondary | ICD-10-CM | POA: Diagnosis not present

## 2021-04-21 DIAGNOSIS — C50912 Malignant neoplasm of unspecified site of left female breast: Secondary | ICD-10-CM | POA: Diagnosis not present

## 2021-04-21 DIAGNOSIS — C7989 Secondary malignant neoplasm of other specified sites: Secondary | ICD-10-CM | POA: Diagnosis not present

## 2021-04-21 DIAGNOSIS — F0150 Vascular dementia without behavioral disturbance: Secondary | ICD-10-CM | POA: Diagnosis not present

## 2021-04-21 DIAGNOSIS — I69818 Other symptoms and signs involving cognitive functions following other cerebrovascular disease: Secondary | ICD-10-CM | POA: Diagnosis not present

## 2021-04-21 DIAGNOSIS — R634 Abnormal weight loss: Secondary | ICD-10-CM | POA: Diagnosis not present

## 2021-04-25 DIAGNOSIS — F0150 Vascular dementia without behavioral disturbance: Secondary | ICD-10-CM | POA: Diagnosis not present

## 2021-04-25 DIAGNOSIS — C7989 Secondary malignant neoplasm of other specified sites: Secondary | ICD-10-CM | POA: Diagnosis not present

## 2021-04-25 DIAGNOSIS — E785 Hyperlipidemia, unspecified: Secondary | ICD-10-CM | POA: Diagnosis not present

## 2021-04-25 DIAGNOSIS — I69818 Other symptoms and signs involving cognitive functions following other cerebrovascular disease: Secondary | ICD-10-CM | POA: Diagnosis not present

## 2021-04-25 DIAGNOSIS — R634 Abnormal weight loss: Secondary | ICD-10-CM | POA: Diagnosis not present

## 2021-04-25 DIAGNOSIS — C50912 Malignant neoplasm of unspecified site of left female breast: Secondary | ICD-10-CM | POA: Diagnosis not present

## 2021-04-26 DIAGNOSIS — E785 Hyperlipidemia, unspecified: Secondary | ICD-10-CM | POA: Diagnosis not present

## 2021-04-26 DIAGNOSIS — F0150 Vascular dementia without behavioral disturbance: Secondary | ICD-10-CM | POA: Diagnosis not present

## 2021-04-26 DIAGNOSIS — C7989 Secondary malignant neoplasm of other specified sites: Secondary | ICD-10-CM | POA: Diagnosis not present

## 2021-04-26 DIAGNOSIS — I69818 Other symptoms and signs involving cognitive functions following other cerebrovascular disease: Secondary | ICD-10-CM | POA: Diagnosis not present

## 2021-04-26 DIAGNOSIS — C50912 Malignant neoplasm of unspecified site of left female breast: Secondary | ICD-10-CM | POA: Diagnosis not present

## 2021-04-26 DIAGNOSIS — R634 Abnormal weight loss: Secondary | ICD-10-CM | POA: Diagnosis not present

## 2021-04-27 DIAGNOSIS — E785 Hyperlipidemia, unspecified: Secondary | ICD-10-CM | POA: Diagnosis not present

## 2021-04-27 DIAGNOSIS — C7989 Secondary malignant neoplasm of other specified sites: Secondary | ICD-10-CM | POA: Diagnosis not present

## 2021-04-27 DIAGNOSIS — R634 Abnormal weight loss: Secondary | ICD-10-CM | POA: Diagnosis not present

## 2021-04-27 DIAGNOSIS — F0150 Vascular dementia without behavioral disturbance: Secondary | ICD-10-CM | POA: Diagnosis not present

## 2021-04-27 DIAGNOSIS — I69818 Other symptoms and signs involving cognitive functions following other cerebrovascular disease: Secondary | ICD-10-CM | POA: Diagnosis not present

## 2021-04-27 DIAGNOSIS — C50912 Malignant neoplasm of unspecified site of left female breast: Secondary | ICD-10-CM | POA: Diagnosis not present

## 2021-04-28 DIAGNOSIS — R634 Abnormal weight loss: Secondary | ICD-10-CM | POA: Diagnosis not present

## 2021-04-28 DIAGNOSIS — F0150 Vascular dementia without behavioral disturbance: Secondary | ICD-10-CM | POA: Diagnosis not present

## 2021-04-28 DIAGNOSIS — I69818 Other symptoms and signs involving cognitive functions following other cerebrovascular disease: Secondary | ICD-10-CM | POA: Diagnosis not present

## 2021-04-28 DIAGNOSIS — C50912 Malignant neoplasm of unspecified site of left female breast: Secondary | ICD-10-CM | POA: Diagnosis not present

## 2021-04-28 DIAGNOSIS — E785 Hyperlipidemia, unspecified: Secondary | ICD-10-CM | POA: Diagnosis not present

## 2021-04-28 DIAGNOSIS — C7989 Secondary malignant neoplasm of other specified sites: Secondary | ICD-10-CM | POA: Diagnosis not present

## 2021-04-30 DIAGNOSIS — I10 Essential (primary) hypertension: Secondary | ICD-10-CM | POA: Diagnosis not present

## 2021-04-30 DIAGNOSIS — M810 Age-related osteoporosis without current pathological fracture: Secondary | ICD-10-CM | POA: Diagnosis not present

## 2021-04-30 DIAGNOSIS — M109 Gout, unspecified: Secondary | ICD-10-CM | POA: Diagnosis not present

## 2021-04-30 DIAGNOSIS — E785 Hyperlipidemia, unspecified: Secondary | ICD-10-CM | POA: Diagnosis not present

## 2021-04-30 DIAGNOSIS — C7989 Secondary malignant neoplasm of other specified sites: Secondary | ICD-10-CM | POA: Diagnosis not present

## 2021-04-30 DIAGNOSIS — E039 Hypothyroidism, unspecified: Secondary | ICD-10-CM | POA: Diagnosis not present

## 2021-04-30 DIAGNOSIS — I69818 Other symptoms and signs involving cognitive functions following other cerebrovascular disease: Secondary | ICD-10-CM | POA: Diagnosis not present

## 2021-04-30 DIAGNOSIS — C50912 Malignant neoplasm of unspecified site of left female breast: Secondary | ICD-10-CM | POA: Diagnosis not present

## 2021-04-30 DIAGNOSIS — G629 Polyneuropathy, unspecified: Secondary | ICD-10-CM | POA: Diagnosis not present

## 2021-04-30 DIAGNOSIS — R634 Abnormal weight loss: Secondary | ICD-10-CM | POA: Diagnosis not present

## 2021-04-30 DIAGNOSIS — F0150 Vascular dementia without behavioral disturbance: Secondary | ICD-10-CM | POA: Diagnosis not present

## 2021-05-01 DIAGNOSIS — E785 Hyperlipidemia, unspecified: Secondary | ICD-10-CM | POA: Diagnosis not present

## 2021-05-01 DIAGNOSIS — C50912 Malignant neoplasm of unspecified site of left female breast: Secondary | ICD-10-CM | POA: Diagnosis not present

## 2021-05-01 DIAGNOSIS — R634 Abnormal weight loss: Secondary | ICD-10-CM | POA: Diagnosis not present

## 2021-05-01 DIAGNOSIS — I69818 Other symptoms and signs involving cognitive functions following other cerebrovascular disease: Secondary | ICD-10-CM | POA: Diagnosis not present

## 2021-05-01 DIAGNOSIS — F0150 Vascular dementia without behavioral disturbance: Secondary | ICD-10-CM | POA: Diagnosis not present

## 2021-05-01 DIAGNOSIS — C7989 Secondary malignant neoplasm of other specified sites: Secondary | ICD-10-CM | POA: Diagnosis not present

## 2021-05-02 DIAGNOSIS — C50912 Malignant neoplasm of unspecified site of left female breast: Secondary | ICD-10-CM | POA: Diagnosis not present

## 2021-05-02 DIAGNOSIS — R634 Abnormal weight loss: Secondary | ICD-10-CM | POA: Diagnosis not present

## 2021-05-02 DIAGNOSIS — I69818 Other symptoms and signs involving cognitive functions following other cerebrovascular disease: Secondary | ICD-10-CM | POA: Diagnosis not present

## 2021-05-02 DIAGNOSIS — C7989 Secondary malignant neoplasm of other specified sites: Secondary | ICD-10-CM | POA: Diagnosis not present

## 2021-05-02 DIAGNOSIS — F0150 Vascular dementia without behavioral disturbance: Secondary | ICD-10-CM | POA: Diagnosis not present

## 2021-05-02 DIAGNOSIS — E785 Hyperlipidemia, unspecified: Secondary | ICD-10-CM | POA: Diagnosis not present

## 2021-05-03 DIAGNOSIS — I69818 Other symptoms and signs involving cognitive functions following other cerebrovascular disease: Secondary | ICD-10-CM | POA: Diagnosis not present

## 2021-05-03 DIAGNOSIS — F0150 Vascular dementia without behavioral disturbance: Secondary | ICD-10-CM | POA: Diagnosis not present

## 2021-05-03 DIAGNOSIS — C7989 Secondary malignant neoplasm of other specified sites: Secondary | ICD-10-CM | POA: Diagnosis not present

## 2021-05-03 DIAGNOSIS — C50912 Malignant neoplasm of unspecified site of left female breast: Secondary | ICD-10-CM | POA: Diagnosis not present

## 2021-05-03 DIAGNOSIS — E785 Hyperlipidemia, unspecified: Secondary | ICD-10-CM | POA: Diagnosis not present

## 2021-05-03 DIAGNOSIS — R634 Abnormal weight loss: Secondary | ICD-10-CM | POA: Diagnosis not present

## 2021-05-04 DIAGNOSIS — F0150 Vascular dementia without behavioral disturbance: Secondary | ICD-10-CM | POA: Diagnosis not present

## 2021-05-04 DIAGNOSIS — C7989 Secondary malignant neoplasm of other specified sites: Secondary | ICD-10-CM | POA: Diagnosis not present

## 2021-05-04 DIAGNOSIS — E785 Hyperlipidemia, unspecified: Secondary | ICD-10-CM | POA: Diagnosis not present

## 2021-05-04 DIAGNOSIS — R634 Abnormal weight loss: Secondary | ICD-10-CM | POA: Diagnosis not present

## 2021-05-04 DIAGNOSIS — I69818 Other symptoms and signs involving cognitive functions following other cerebrovascular disease: Secondary | ICD-10-CM | POA: Diagnosis not present

## 2021-05-04 DIAGNOSIS — C50912 Malignant neoplasm of unspecified site of left female breast: Secondary | ICD-10-CM | POA: Diagnosis not present

## 2021-05-05 DIAGNOSIS — F0150 Vascular dementia without behavioral disturbance: Secondary | ICD-10-CM | POA: Diagnosis not present

## 2021-05-05 DIAGNOSIS — I69818 Other symptoms and signs involving cognitive functions following other cerebrovascular disease: Secondary | ICD-10-CM | POA: Diagnosis not present

## 2021-05-05 DIAGNOSIS — C50912 Malignant neoplasm of unspecified site of left female breast: Secondary | ICD-10-CM | POA: Diagnosis not present

## 2021-05-05 DIAGNOSIS — E785 Hyperlipidemia, unspecified: Secondary | ICD-10-CM | POA: Diagnosis not present

## 2021-05-05 DIAGNOSIS — C7989 Secondary malignant neoplasm of other specified sites: Secondary | ICD-10-CM | POA: Diagnosis not present

## 2021-05-05 DIAGNOSIS — R634 Abnormal weight loss: Secondary | ICD-10-CM | POA: Diagnosis not present

## 2021-05-08 DIAGNOSIS — E785 Hyperlipidemia, unspecified: Secondary | ICD-10-CM | POA: Diagnosis not present

## 2021-05-08 DIAGNOSIS — C50912 Malignant neoplasm of unspecified site of left female breast: Secondary | ICD-10-CM | POA: Diagnosis not present

## 2021-05-08 DIAGNOSIS — R634 Abnormal weight loss: Secondary | ICD-10-CM | POA: Diagnosis not present

## 2021-05-08 DIAGNOSIS — I69818 Other symptoms and signs involving cognitive functions following other cerebrovascular disease: Secondary | ICD-10-CM | POA: Diagnosis not present

## 2021-05-08 DIAGNOSIS — C7989 Secondary malignant neoplasm of other specified sites: Secondary | ICD-10-CM | POA: Diagnosis not present

## 2021-05-08 DIAGNOSIS — F0150 Vascular dementia without behavioral disturbance: Secondary | ICD-10-CM | POA: Diagnosis not present

## 2021-05-09 DIAGNOSIS — E785 Hyperlipidemia, unspecified: Secondary | ICD-10-CM | POA: Diagnosis not present

## 2021-05-09 DIAGNOSIS — F0150 Vascular dementia without behavioral disturbance: Secondary | ICD-10-CM | POA: Diagnosis not present

## 2021-05-09 DIAGNOSIS — C7989 Secondary malignant neoplasm of other specified sites: Secondary | ICD-10-CM | POA: Diagnosis not present

## 2021-05-09 DIAGNOSIS — C50912 Malignant neoplasm of unspecified site of left female breast: Secondary | ICD-10-CM | POA: Diagnosis not present

## 2021-05-09 DIAGNOSIS — R634 Abnormal weight loss: Secondary | ICD-10-CM | POA: Diagnosis not present

## 2021-05-09 DIAGNOSIS — I69818 Other symptoms and signs involving cognitive functions following other cerebrovascular disease: Secondary | ICD-10-CM | POA: Diagnosis not present

## 2021-05-10 DIAGNOSIS — C50912 Malignant neoplasm of unspecified site of left female breast: Secondary | ICD-10-CM | POA: Diagnosis not present

## 2021-05-10 DIAGNOSIS — R634 Abnormal weight loss: Secondary | ICD-10-CM | POA: Diagnosis not present

## 2021-05-10 DIAGNOSIS — F0150 Vascular dementia without behavioral disturbance: Secondary | ICD-10-CM | POA: Diagnosis not present

## 2021-05-10 DIAGNOSIS — I69818 Other symptoms and signs involving cognitive functions following other cerebrovascular disease: Secondary | ICD-10-CM | POA: Diagnosis not present

## 2021-05-10 DIAGNOSIS — E785 Hyperlipidemia, unspecified: Secondary | ICD-10-CM | POA: Diagnosis not present

## 2021-05-10 DIAGNOSIS — C7989 Secondary malignant neoplasm of other specified sites: Secondary | ICD-10-CM | POA: Diagnosis not present

## 2021-05-11 DIAGNOSIS — R634 Abnormal weight loss: Secondary | ICD-10-CM | POA: Diagnosis not present

## 2021-05-11 DIAGNOSIS — E785 Hyperlipidemia, unspecified: Secondary | ICD-10-CM | POA: Diagnosis not present

## 2021-05-11 DIAGNOSIS — C50912 Malignant neoplasm of unspecified site of left female breast: Secondary | ICD-10-CM | POA: Diagnosis not present

## 2021-05-11 DIAGNOSIS — I69818 Other symptoms and signs involving cognitive functions following other cerebrovascular disease: Secondary | ICD-10-CM | POA: Diagnosis not present

## 2021-05-11 DIAGNOSIS — F0150 Vascular dementia without behavioral disturbance: Secondary | ICD-10-CM | POA: Diagnosis not present

## 2021-05-11 DIAGNOSIS — C7989 Secondary malignant neoplasm of other specified sites: Secondary | ICD-10-CM | POA: Diagnosis not present

## 2021-05-12 DIAGNOSIS — I69818 Other symptoms and signs involving cognitive functions following other cerebrovascular disease: Secondary | ICD-10-CM | POA: Diagnosis not present

## 2021-05-12 DIAGNOSIS — R634 Abnormal weight loss: Secondary | ICD-10-CM | POA: Diagnosis not present

## 2021-05-12 DIAGNOSIS — C50912 Malignant neoplasm of unspecified site of left female breast: Secondary | ICD-10-CM | POA: Diagnosis not present

## 2021-05-12 DIAGNOSIS — C7989 Secondary malignant neoplasm of other specified sites: Secondary | ICD-10-CM | POA: Diagnosis not present

## 2021-05-12 DIAGNOSIS — E785 Hyperlipidemia, unspecified: Secondary | ICD-10-CM | POA: Diagnosis not present

## 2021-05-12 DIAGNOSIS — F0150 Vascular dementia without behavioral disturbance: Secondary | ICD-10-CM | POA: Diagnosis not present

## 2021-05-15 DIAGNOSIS — C7989 Secondary malignant neoplasm of other specified sites: Secondary | ICD-10-CM | POA: Diagnosis not present

## 2021-05-15 DIAGNOSIS — R634 Abnormal weight loss: Secondary | ICD-10-CM | POA: Diagnosis not present

## 2021-05-15 DIAGNOSIS — C50912 Malignant neoplasm of unspecified site of left female breast: Secondary | ICD-10-CM | POA: Diagnosis not present

## 2021-05-15 DIAGNOSIS — F0150 Vascular dementia without behavioral disturbance: Secondary | ICD-10-CM | POA: Diagnosis not present

## 2021-05-15 DIAGNOSIS — E785 Hyperlipidemia, unspecified: Secondary | ICD-10-CM | POA: Diagnosis not present

## 2021-05-15 DIAGNOSIS — I69818 Other symptoms and signs involving cognitive functions following other cerebrovascular disease: Secondary | ICD-10-CM | POA: Diagnosis not present

## 2021-05-16 DIAGNOSIS — C7989 Secondary malignant neoplasm of other specified sites: Secondary | ICD-10-CM | POA: Diagnosis not present

## 2021-05-16 DIAGNOSIS — C50912 Malignant neoplasm of unspecified site of left female breast: Secondary | ICD-10-CM | POA: Diagnosis not present

## 2021-05-16 DIAGNOSIS — R634 Abnormal weight loss: Secondary | ICD-10-CM | POA: Diagnosis not present

## 2021-05-16 DIAGNOSIS — I69818 Other symptoms and signs involving cognitive functions following other cerebrovascular disease: Secondary | ICD-10-CM | POA: Diagnosis not present

## 2021-05-16 DIAGNOSIS — F0150 Vascular dementia without behavioral disturbance: Secondary | ICD-10-CM | POA: Diagnosis not present

## 2021-05-16 DIAGNOSIS — E785 Hyperlipidemia, unspecified: Secondary | ICD-10-CM | POA: Diagnosis not present

## 2021-05-17 DIAGNOSIS — F0150 Vascular dementia without behavioral disturbance: Secondary | ICD-10-CM | POA: Diagnosis not present

## 2021-05-17 DIAGNOSIS — E785 Hyperlipidemia, unspecified: Secondary | ICD-10-CM | POA: Diagnosis not present

## 2021-05-17 DIAGNOSIS — C7989 Secondary malignant neoplasm of other specified sites: Secondary | ICD-10-CM | POA: Diagnosis not present

## 2021-05-17 DIAGNOSIS — I69818 Other symptoms and signs involving cognitive functions following other cerebrovascular disease: Secondary | ICD-10-CM | POA: Diagnosis not present

## 2021-05-17 DIAGNOSIS — R634 Abnormal weight loss: Secondary | ICD-10-CM | POA: Diagnosis not present

## 2021-05-17 DIAGNOSIS — C50912 Malignant neoplasm of unspecified site of left female breast: Secondary | ICD-10-CM | POA: Diagnosis not present

## 2021-05-18 DIAGNOSIS — F0150 Vascular dementia without behavioral disturbance: Secondary | ICD-10-CM | POA: Diagnosis not present

## 2021-05-18 DIAGNOSIS — R634 Abnormal weight loss: Secondary | ICD-10-CM | POA: Diagnosis not present

## 2021-05-18 DIAGNOSIS — C7989 Secondary malignant neoplasm of other specified sites: Secondary | ICD-10-CM | POA: Diagnosis not present

## 2021-05-18 DIAGNOSIS — C50912 Malignant neoplasm of unspecified site of left female breast: Secondary | ICD-10-CM | POA: Diagnosis not present

## 2021-05-18 DIAGNOSIS — E785 Hyperlipidemia, unspecified: Secondary | ICD-10-CM | POA: Diagnosis not present

## 2021-05-18 DIAGNOSIS — I69818 Other symptoms and signs involving cognitive functions following other cerebrovascular disease: Secondary | ICD-10-CM | POA: Diagnosis not present

## 2021-05-19 DIAGNOSIS — E785 Hyperlipidemia, unspecified: Secondary | ICD-10-CM | POA: Diagnosis not present

## 2021-05-19 DIAGNOSIS — C50912 Malignant neoplasm of unspecified site of left female breast: Secondary | ICD-10-CM | POA: Diagnosis not present

## 2021-05-19 DIAGNOSIS — F0150 Vascular dementia without behavioral disturbance: Secondary | ICD-10-CM | POA: Diagnosis not present

## 2021-05-19 DIAGNOSIS — C7989 Secondary malignant neoplasm of other specified sites: Secondary | ICD-10-CM | POA: Diagnosis not present

## 2021-05-19 DIAGNOSIS — R634 Abnormal weight loss: Secondary | ICD-10-CM | POA: Diagnosis not present

## 2021-05-19 DIAGNOSIS — I69818 Other symptoms and signs involving cognitive functions following other cerebrovascular disease: Secondary | ICD-10-CM | POA: Diagnosis not present

## 2021-05-22 DIAGNOSIS — E785 Hyperlipidemia, unspecified: Secondary | ICD-10-CM | POA: Diagnosis not present

## 2021-05-22 DIAGNOSIS — C7989 Secondary malignant neoplasm of other specified sites: Secondary | ICD-10-CM | POA: Diagnosis not present

## 2021-05-22 DIAGNOSIS — C50912 Malignant neoplasm of unspecified site of left female breast: Secondary | ICD-10-CM | POA: Diagnosis not present

## 2021-05-22 DIAGNOSIS — F0150 Vascular dementia without behavioral disturbance: Secondary | ICD-10-CM | POA: Diagnosis not present

## 2021-05-22 DIAGNOSIS — I69818 Other symptoms and signs involving cognitive functions following other cerebrovascular disease: Secondary | ICD-10-CM | POA: Diagnosis not present

## 2021-05-22 DIAGNOSIS — R634 Abnormal weight loss: Secondary | ICD-10-CM | POA: Diagnosis not present

## 2021-05-23 DIAGNOSIS — E785 Hyperlipidemia, unspecified: Secondary | ICD-10-CM | POA: Diagnosis not present

## 2021-05-23 DIAGNOSIS — C7989 Secondary malignant neoplasm of other specified sites: Secondary | ICD-10-CM | POA: Diagnosis not present

## 2021-05-23 DIAGNOSIS — I69818 Other symptoms and signs involving cognitive functions following other cerebrovascular disease: Secondary | ICD-10-CM | POA: Diagnosis not present

## 2021-05-23 DIAGNOSIS — C50912 Malignant neoplasm of unspecified site of left female breast: Secondary | ICD-10-CM | POA: Diagnosis not present

## 2021-05-23 DIAGNOSIS — R634 Abnormal weight loss: Secondary | ICD-10-CM | POA: Diagnosis not present

## 2021-05-23 DIAGNOSIS — F0150 Vascular dementia without behavioral disturbance: Secondary | ICD-10-CM | POA: Diagnosis not present

## 2021-05-24 DIAGNOSIS — R634 Abnormal weight loss: Secondary | ICD-10-CM | POA: Diagnosis not present

## 2021-05-24 DIAGNOSIS — I69818 Other symptoms and signs involving cognitive functions following other cerebrovascular disease: Secondary | ICD-10-CM | POA: Diagnosis not present

## 2021-05-24 DIAGNOSIS — C50912 Malignant neoplasm of unspecified site of left female breast: Secondary | ICD-10-CM | POA: Diagnosis not present

## 2021-05-24 DIAGNOSIS — F0150 Vascular dementia without behavioral disturbance: Secondary | ICD-10-CM | POA: Diagnosis not present

## 2021-05-24 DIAGNOSIS — C7989 Secondary malignant neoplasm of other specified sites: Secondary | ICD-10-CM | POA: Diagnosis not present

## 2021-05-24 DIAGNOSIS — E785 Hyperlipidemia, unspecified: Secondary | ICD-10-CM | POA: Diagnosis not present

## 2021-05-25 DIAGNOSIS — R634 Abnormal weight loss: Secondary | ICD-10-CM | POA: Diagnosis not present

## 2021-05-25 DIAGNOSIS — C50912 Malignant neoplasm of unspecified site of left female breast: Secondary | ICD-10-CM | POA: Diagnosis not present

## 2021-05-25 DIAGNOSIS — I69818 Other symptoms and signs involving cognitive functions following other cerebrovascular disease: Secondary | ICD-10-CM | POA: Diagnosis not present

## 2021-05-25 DIAGNOSIS — E785 Hyperlipidemia, unspecified: Secondary | ICD-10-CM | POA: Diagnosis not present

## 2021-05-25 DIAGNOSIS — C7989 Secondary malignant neoplasm of other specified sites: Secondary | ICD-10-CM | POA: Diagnosis not present

## 2021-05-25 DIAGNOSIS — F0150 Vascular dementia without behavioral disturbance: Secondary | ICD-10-CM | POA: Diagnosis not present

## 2021-05-26 DIAGNOSIS — C50912 Malignant neoplasm of unspecified site of left female breast: Secondary | ICD-10-CM | POA: Diagnosis not present

## 2021-05-26 DIAGNOSIS — I69818 Other symptoms and signs involving cognitive functions following other cerebrovascular disease: Secondary | ICD-10-CM | POA: Diagnosis not present

## 2021-05-26 DIAGNOSIS — F0150 Vascular dementia without behavioral disturbance: Secondary | ICD-10-CM | POA: Diagnosis not present

## 2021-05-26 DIAGNOSIS — C7989 Secondary malignant neoplasm of other specified sites: Secondary | ICD-10-CM | POA: Diagnosis not present

## 2021-05-26 DIAGNOSIS — E785 Hyperlipidemia, unspecified: Secondary | ICD-10-CM | POA: Diagnosis not present

## 2021-05-26 DIAGNOSIS — R634 Abnormal weight loss: Secondary | ICD-10-CM | POA: Diagnosis not present

## 2021-05-29 DIAGNOSIS — E785 Hyperlipidemia, unspecified: Secondary | ICD-10-CM | POA: Diagnosis not present

## 2021-05-29 DIAGNOSIS — R634 Abnormal weight loss: Secondary | ICD-10-CM | POA: Diagnosis not present

## 2021-05-29 DIAGNOSIS — F0150 Vascular dementia without behavioral disturbance: Secondary | ICD-10-CM | POA: Diagnosis not present

## 2021-05-29 DIAGNOSIS — I69818 Other symptoms and signs involving cognitive functions following other cerebrovascular disease: Secondary | ICD-10-CM | POA: Diagnosis not present

## 2021-05-29 DIAGNOSIS — C50912 Malignant neoplasm of unspecified site of left female breast: Secondary | ICD-10-CM | POA: Diagnosis not present

## 2021-05-29 DIAGNOSIS — C7989 Secondary malignant neoplasm of other specified sites: Secondary | ICD-10-CM | POA: Diagnosis not present

## 2021-05-30 DIAGNOSIS — E785 Hyperlipidemia, unspecified: Secondary | ICD-10-CM | POA: Diagnosis not present

## 2021-05-30 DIAGNOSIS — F0150 Vascular dementia without behavioral disturbance: Secondary | ICD-10-CM | POA: Diagnosis not present

## 2021-05-30 DIAGNOSIS — C7989 Secondary malignant neoplasm of other specified sites: Secondary | ICD-10-CM | POA: Diagnosis not present

## 2021-05-30 DIAGNOSIS — I69818 Other symptoms and signs involving cognitive functions following other cerebrovascular disease: Secondary | ICD-10-CM | POA: Diagnosis not present

## 2021-05-30 DIAGNOSIS — C50912 Malignant neoplasm of unspecified site of left female breast: Secondary | ICD-10-CM | POA: Diagnosis not present

## 2021-05-30 DIAGNOSIS — R634 Abnormal weight loss: Secondary | ICD-10-CM | POA: Diagnosis not present

## 2021-05-31 DIAGNOSIS — I10 Essential (primary) hypertension: Secondary | ICD-10-CM | POA: Diagnosis not present

## 2021-05-31 DIAGNOSIS — M810 Age-related osteoporosis without current pathological fracture: Secondary | ICD-10-CM | POA: Diagnosis not present

## 2021-05-31 DIAGNOSIS — G629 Polyneuropathy, unspecified: Secondary | ICD-10-CM | POA: Diagnosis not present

## 2021-05-31 DIAGNOSIS — C50912 Malignant neoplasm of unspecified site of left female breast: Secondary | ICD-10-CM | POA: Diagnosis not present

## 2021-05-31 DIAGNOSIS — I69818 Other symptoms and signs involving cognitive functions following other cerebrovascular disease: Secondary | ICD-10-CM | POA: Diagnosis not present

## 2021-05-31 DIAGNOSIS — C7989 Secondary malignant neoplasm of other specified sites: Secondary | ICD-10-CM | POA: Diagnosis not present

## 2021-05-31 DIAGNOSIS — E785 Hyperlipidemia, unspecified: Secondary | ICD-10-CM | POA: Diagnosis not present

## 2021-05-31 DIAGNOSIS — R634 Abnormal weight loss: Secondary | ICD-10-CM | POA: Diagnosis not present

## 2021-05-31 DIAGNOSIS — F0150 Vascular dementia without behavioral disturbance: Secondary | ICD-10-CM | POA: Diagnosis not present

## 2021-05-31 DIAGNOSIS — E039 Hypothyroidism, unspecified: Secondary | ICD-10-CM | POA: Diagnosis not present

## 2021-05-31 DIAGNOSIS — R32 Unspecified urinary incontinence: Secondary | ICD-10-CM | POA: Diagnosis not present

## 2021-05-31 DIAGNOSIS — M109 Gout, unspecified: Secondary | ICD-10-CM | POA: Diagnosis not present

## 2021-05-31 DIAGNOSIS — R159 Full incontinence of feces: Secondary | ICD-10-CM | POA: Diagnosis not present

## 2021-05-31 DIAGNOSIS — R040 Epistaxis: Secondary | ICD-10-CM | POA: Diagnosis not present

## 2021-05-31 DIAGNOSIS — R110 Nausea: Secondary | ICD-10-CM | POA: Diagnosis not present

## 2021-06-01 DIAGNOSIS — I69818 Other symptoms and signs involving cognitive functions following other cerebrovascular disease: Secondary | ICD-10-CM | POA: Diagnosis not present

## 2021-06-01 DIAGNOSIS — E785 Hyperlipidemia, unspecified: Secondary | ICD-10-CM | POA: Diagnosis not present

## 2021-06-01 DIAGNOSIS — C7989 Secondary malignant neoplasm of other specified sites: Secondary | ICD-10-CM | POA: Diagnosis not present

## 2021-06-01 DIAGNOSIS — F0150 Vascular dementia without behavioral disturbance: Secondary | ICD-10-CM | POA: Diagnosis not present

## 2021-06-01 DIAGNOSIS — R634 Abnormal weight loss: Secondary | ICD-10-CM | POA: Diagnosis not present

## 2021-06-01 DIAGNOSIS — C50912 Malignant neoplasm of unspecified site of left female breast: Secondary | ICD-10-CM | POA: Diagnosis not present

## 2021-06-02 DIAGNOSIS — C50912 Malignant neoplasm of unspecified site of left female breast: Secondary | ICD-10-CM | POA: Diagnosis not present

## 2021-06-02 DIAGNOSIS — E785 Hyperlipidemia, unspecified: Secondary | ICD-10-CM | POA: Diagnosis not present

## 2021-06-02 DIAGNOSIS — R634 Abnormal weight loss: Secondary | ICD-10-CM | POA: Diagnosis not present

## 2021-06-02 DIAGNOSIS — F0150 Vascular dementia without behavioral disturbance: Secondary | ICD-10-CM | POA: Diagnosis not present

## 2021-06-02 DIAGNOSIS — C7989 Secondary malignant neoplasm of other specified sites: Secondary | ICD-10-CM | POA: Diagnosis not present

## 2021-06-02 DIAGNOSIS — I69818 Other symptoms and signs involving cognitive functions following other cerebrovascular disease: Secondary | ICD-10-CM | POA: Diagnosis not present

## 2021-06-03 DIAGNOSIS — C50912 Malignant neoplasm of unspecified site of left female breast: Secondary | ICD-10-CM | POA: Diagnosis not present

## 2021-06-03 DIAGNOSIS — I69818 Other symptoms and signs involving cognitive functions following other cerebrovascular disease: Secondary | ICD-10-CM | POA: Diagnosis not present

## 2021-06-03 DIAGNOSIS — F0150 Vascular dementia without behavioral disturbance: Secondary | ICD-10-CM | POA: Diagnosis not present

## 2021-06-03 DIAGNOSIS — E785 Hyperlipidemia, unspecified: Secondary | ICD-10-CM | POA: Diagnosis not present

## 2021-06-03 DIAGNOSIS — C7989 Secondary malignant neoplasm of other specified sites: Secondary | ICD-10-CM | POA: Diagnosis not present

## 2021-06-03 DIAGNOSIS — R634 Abnormal weight loss: Secondary | ICD-10-CM | POA: Diagnosis not present

## 2021-06-04 DIAGNOSIS — R634 Abnormal weight loss: Secondary | ICD-10-CM | POA: Diagnosis not present

## 2021-06-04 DIAGNOSIS — E785 Hyperlipidemia, unspecified: Secondary | ICD-10-CM | POA: Diagnosis not present

## 2021-06-04 DIAGNOSIS — F0150 Vascular dementia without behavioral disturbance: Secondary | ICD-10-CM | POA: Diagnosis not present

## 2021-06-04 DIAGNOSIS — C50912 Malignant neoplasm of unspecified site of left female breast: Secondary | ICD-10-CM | POA: Diagnosis not present

## 2021-06-04 DIAGNOSIS — C7989 Secondary malignant neoplasm of other specified sites: Secondary | ICD-10-CM | POA: Diagnosis not present

## 2021-06-04 DIAGNOSIS — I69818 Other symptoms and signs involving cognitive functions following other cerebrovascular disease: Secondary | ICD-10-CM | POA: Diagnosis not present

## 2021-06-05 DIAGNOSIS — F0150 Vascular dementia without behavioral disturbance: Secondary | ICD-10-CM | POA: Diagnosis not present

## 2021-06-05 DIAGNOSIS — C7989 Secondary malignant neoplasm of other specified sites: Secondary | ICD-10-CM | POA: Diagnosis not present

## 2021-06-05 DIAGNOSIS — E785 Hyperlipidemia, unspecified: Secondary | ICD-10-CM | POA: Diagnosis not present

## 2021-06-05 DIAGNOSIS — I69818 Other symptoms and signs involving cognitive functions following other cerebrovascular disease: Secondary | ICD-10-CM | POA: Diagnosis not present

## 2021-06-05 DIAGNOSIS — R634 Abnormal weight loss: Secondary | ICD-10-CM | POA: Diagnosis not present

## 2021-06-05 DIAGNOSIS — C50912 Malignant neoplasm of unspecified site of left female breast: Secondary | ICD-10-CM | POA: Diagnosis not present

## 2021-06-06 DIAGNOSIS — C7989 Secondary malignant neoplasm of other specified sites: Secondary | ICD-10-CM | POA: Diagnosis not present

## 2021-06-06 DIAGNOSIS — C50912 Malignant neoplasm of unspecified site of left female breast: Secondary | ICD-10-CM | POA: Diagnosis not present

## 2021-06-06 DIAGNOSIS — F0150 Vascular dementia without behavioral disturbance: Secondary | ICD-10-CM | POA: Diagnosis not present

## 2021-06-06 DIAGNOSIS — E785 Hyperlipidemia, unspecified: Secondary | ICD-10-CM | POA: Diagnosis not present

## 2021-06-06 DIAGNOSIS — I69818 Other symptoms and signs involving cognitive functions following other cerebrovascular disease: Secondary | ICD-10-CM | POA: Diagnosis not present

## 2021-06-06 DIAGNOSIS — R634 Abnormal weight loss: Secondary | ICD-10-CM | POA: Diagnosis not present

## 2021-06-07 DIAGNOSIS — C7989 Secondary malignant neoplasm of other specified sites: Secondary | ICD-10-CM | POA: Diagnosis not present

## 2021-06-07 DIAGNOSIS — E785 Hyperlipidemia, unspecified: Secondary | ICD-10-CM | POA: Diagnosis not present

## 2021-06-07 DIAGNOSIS — I69818 Other symptoms and signs involving cognitive functions following other cerebrovascular disease: Secondary | ICD-10-CM | POA: Diagnosis not present

## 2021-06-07 DIAGNOSIS — F0150 Vascular dementia without behavioral disturbance: Secondary | ICD-10-CM | POA: Diagnosis not present

## 2021-06-07 DIAGNOSIS — C50912 Malignant neoplasm of unspecified site of left female breast: Secondary | ICD-10-CM | POA: Diagnosis not present

## 2021-06-07 DIAGNOSIS — R634 Abnormal weight loss: Secondary | ICD-10-CM | POA: Diagnosis not present

## 2021-06-08 DIAGNOSIS — C50912 Malignant neoplasm of unspecified site of left female breast: Secondary | ICD-10-CM | POA: Diagnosis not present

## 2021-06-08 DIAGNOSIS — R634 Abnormal weight loss: Secondary | ICD-10-CM | POA: Diagnosis not present

## 2021-06-08 DIAGNOSIS — I69818 Other symptoms and signs involving cognitive functions following other cerebrovascular disease: Secondary | ICD-10-CM | POA: Diagnosis not present

## 2021-06-08 DIAGNOSIS — C7989 Secondary malignant neoplasm of other specified sites: Secondary | ICD-10-CM | POA: Diagnosis not present

## 2021-06-08 DIAGNOSIS — E785 Hyperlipidemia, unspecified: Secondary | ICD-10-CM | POA: Diagnosis not present

## 2021-06-08 DIAGNOSIS — F0150 Vascular dementia without behavioral disturbance: Secondary | ICD-10-CM | POA: Diagnosis not present

## 2021-06-09 DIAGNOSIS — C50912 Malignant neoplasm of unspecified site of left female breast: Secondary | ICD-10-CM | POA: Diagnosis not present

## 2021-06-09 DIAGNOSIS — R634 Abnormal weight loss: Secondary | ICD-10-CM | POA: Diagnosis not present

## 2021-06-09 DIAGNOSIS — E785 Hyperlipidemia, unspecified: Secondary | ICD-10-CM | POA: Diagnosis not present

## 2021-06-09 DIAGNOSIS — I69818 Other symptoms and signs involving cognitive functions following other cerebrovascular disease: Secondary | ICD-10-CM | POA: Diagnosis not present

## 2021-06-09 DIAGNOSIS — C7989 Secondary malignant neoplasm of other specified sites: Secondary | ICD-10-CM | POA: Diagnosis not present

## 2021-06-09 DIAGNOSIS — F0150 Vascular dementia without behavioral disturbance: Secondary | ICD-10-CM | POA: Diagnosis not present

## 2021-06-10 DIAGNOSIS — C50912 Malignant neoplasm of unspecified site of left female breast: Secondary | ICD-10-CM | POA: Diagnosis not present

## 2021-06-10 DIAGNOSIS — F0150 Vascular dementia without behavioral disturbance: Secondary | ICD-10-CM | POA: Diagnosis not present

## 2021-06-10 DIAGNOSIS — E785 Hyperlipidemia, unspecified: Secondary | ICD-10-CM | POA: Diagnosis not present

## 2021-06-10 DIAGNOSIS — C7989 Secondary malignant neoplasm of other specified sites: Secondary | ICD-10-CM | POA: Diagnosis not present

## 2021-06-10 DIAGNOSIS — R634 Abnormal weight loss: Secondary | ICD-10-CM | POA: Diagnosis not present

## 2021-06-10 DIAGNOSIS — I69818 Other symptoms and signs involving cognitive functions following other cerebrovascular disease: Secondary | ICD-10-CM | POA: Diagnosis not present

## 2021-06-11 DIAGNOSIS — C7989 Secondary malignant neoplasm of other specified sites: Secondary | ICD-10-CM | POA: Diagnosis not present

## 2021-06-11 DIAGNOSIS — E785 Hyperlipidemia, unspecified: Secondary | ICD-10-CM | POA: Diagnosis not present

## 2021-06-11 DIAGNOSIS — I69818 Other symptoms and signs involving cognitive functions following other cerebrovascular disease: Secondary | ICD-10-CM | POA: Diagnosis not present

## 2021-06-11 DIAGNOSIS — C50912 Malignant neoplasm of unspecified site of left female breast: Secondary | ICD-10-CM | POA: Diagnosis not present

## 2021-06-11 DIAGNOSIS — R634 Abnormal weight loss: Secondary | ICD-10-CM | POA: Diagnosis not present

## 2021-06-11 DIAGNOSIS — F0150 Vascular dementia without behavioral disturbance: Secondary | ICD-10-CM | POA: Diagnosis not present

## 2021-06-30 DEATH — deceased

## 2021-11-08 ENCOUNTER — Encounter: Payer: Medicare Other | Admitting: Family
